# Patient Record
Sex: Male | Born: 1935 | Race: White | Hispanic: No | Marital: Married | Smoking: Former smoker
Health system: Southern US, Community
[De-identification: ages and names within clinical notes are randomized; demographics above are authoritative.]

## PROBLEM LIST (undated history)

## (undated) DIAGNOSIS — N183 Chronic kidney disease, stage 3 unspecified: Secondary | ICD-10-CM

## (undated) DIAGNOSIS — I493 Ventricular premature depolarization: Secondary | ICD-10-CM

## (undated) DIAGNOSIS — I4821 Permanent atrial fibrillation: Secondary | ICD-10-CM

## (undated) DIAGNOSIS — I495 Sick sinus syndrome: Secondary | ICD-10-CM

## (undated) DIAGNOSIS — E739 Lactose intolerance, unspecified: Secondary | ICD-10-CM

## (undated) DIAGNOSIS — F419 Anxiety disorder, unspecified: Secondary | ICD-10-CM

## (undated) DIAGNOSIS — I10 Essential (primary) hypertension: Secondary | ICD-10-CM

## (undated) DIAGNOSIS — C801 Malignant (primary) neoplasm, unspecified: Secondary | ICD-10-CM

## (undated) DIAGNOSIS — S0291XA Unspecified fracture of skull, initial encounter for closed fracture: Secondary | ICD-10-CM

## (undated) DIAGNOSIS — K589 Irritable bowel syndrome without diarrhea: Secondary | ICD-10-CM

## (undated) DIAGNOSIS — E782 Mixed hyperlipidemia: Secondary | ICD-10-CM

## (undated) DIAGNOSIS — I340 Nonrheumatic mitral (valve) insufficiency: Secondary | ICD-10-CM

## (undated) DIAGNOSIS — I2510 Atherosclerotic heart disease of native coronary artery without angina pectoris: Secondary | ICD-10-CM

## (undated) DIAGNOSIS — I351 Nonrheumatic aortic (valve) insufficiency: Secondary | ICD-10-CM

## (undated) DIAGNOSIS — C4490 Unspecified malignant neoplasm of skin, unspecified: Secondary | ICD-10-CM

## (undated) HISTORY — DX: Unspecified malignant neoplasm of skin, unspecified: C44.90

## (undated) HISTORY — DX: Irritable bowel syndrome, unspecified: K58.9

## (undated) HISTORY — DX: Permanent atrial fibrillation: I48.21

## (undated) HISTORY — DX: Essential (primary) hypertension: I10

## (undated) HISTORY — DX: Nonrheumatic aortic (valve) insufficiency: I35.1

## (undated) HISTORY — DX: Mixed hyperlipidemia: E78.2

## (undated) HISTORY — DX: Nonrheumatic mitral (valve) insufficiency: I34.0

## (undated) HISTORY — DX: Unspecified fracture of skull, initial encounter for closed fracture: S02.91XA

## (undated) HISTORY — DX: Atherosclerotic heart disease of native coronary artery without angina pectoris: I25.10

## (undated) HISTORY — PX: OTHER SURGICAL HISTORY: SHX169

## (undated) HISTORY — DX: Ventricular premature depolarization: I49.3

## (undated) HISTORY — DX: Anxiety disorder, unspecified: F41.9

## (undated) HISTORY — DX: Lactose intolerance, unspecified: E73.9

## (undated) HISTORY — PX: PENILE PROSTHESIS IMPLANT: SHX240

## (undated) HISTORY — DX: Sick sinus syndrome: I49.5

---
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
## 2005-01-21 ENCOUNTER — Encounter: Payer: Self-pay | Admitting: Cardiology

## 2005-02-16 ENCOUNTER — Encounter: Payer: Self-pay | Admitting: Cardiology

## 2008-05-22 ENCOUNTER — Encounter: Payer: Self-pay | Admitting: Cardiology

## 2008-09-23 ENCOUNTER — Encounter: Payer: Self-pay | Admitting: Cardiology

## 2009-04-15 ENCOUNTER — Encounter: Payer: Self-pay | Admitting: Cardiology

## 2009-04-21 ENCOUNTER — Encounter: Payer: Self-pay | Admitting: Cardiology

## 2009-05-25 DIAGNOSIS — E782 Mixed hyperlipidemia: Secondary | ICD-10-CM

## 2009-05-25 DIAGNOSIS — F411 Generalized anxiety disorder: Secondary | ICD-10-CM | POA: Insufficient documentation

## 2009-05-25 DIAGNOSIS — G4700 Insomnia, unspecified: Secondary | ICD-10-CM | POA: Insufficient documentation

## 2009-05-25 DIAGNOSIS — Z87448 Personal history of other diseases of urinary system: Secondary | ICD-10-CM | POA: Insufficient documentation

## 2009-05-25 DIAGNOSIS — K4090 Unilateral inguinal hernia, without obstruction or gangrene, not specified as recurrent: Secondary | ICD-10-CM | POA: Insufficient documentation

## 2009-05-26 ENCOUNTER — Ambulatory Visit: Payer: Self-pay | Admitting: Cardiology

## 2009-05-26 DIAGNOSIS — I2510 Atherosclerotic heart disease of native coronary artery without angina pectoris: Secondary | ICD-10-CM

## 2009-05-26 DIAGNOSIS — N529 Male erectile dysfunction, unspecified: Secondary | ICD-10-CM

## 2009-06-08 ENCOUNTER — Telehealth (INDEPENDENT_AMBULATORY_CARE_PROVIDER_SITE_OTHER): Payer: Self-pay | Admitting: *Deleted

## 2009-08-20 ENCOUNTER — Encounter: Payer: Self-pay | Admitting: Cardiology

## 2009-08-24 ENCOUNTER — Ambulatory Visit (HOSPITAL_BASED_OUTPATIENT_CLINIC_OR_DEPARTMENT_OTHER): Admission: RE | Admit: 2009-08-24 | Discharge: 2009-08-24 | Payer: Self-pay | Admitting: Urology

## 2009-08-24 ENCOUNTER — Encounter: Payer: Self-pay | Admitting: Cardiology

## 2009-10-16 ENCOUNTER — Encounter: Payer: Self-pay | Admitting: Cardiology

## 2009-12-03 ENCOUNTER — Encounter: Payer: Self-pay | Admitting: Cardiology

## 2009-12-10 ENCOUNTER — Encounter: Payer: Self-pay | Admitting: Cardiology

## 2009-12-17 ENCOUNTER — Encounter (INDEPENDENT_AMBULATORY_CARE_PROVIDER_SITE_OTHER): Payer: Self-pay | Admitting: *Deleted

## 2009-12-17 ENCOUNTER — Ambulatory Visit: Payer: Self-pay | Admitting: Cardiology

## 2009-12-17 DIAGNOSIS — I10 Essential (primary) hypertension: Secondary | ICD-10-CM | POA: Insufficient documentation

## 2009-12-22 ENCOUNTER — Ambulatory Visit: Payer: Self-pay | Admitting: Cardiology

## 2010-05-27 ENCOUNTER — Encounter: Payer: Self-pay | Admitting: Cardiology

## 2010-06-15 ENCOUNTER — Ambulatory Visit: Payer: Self-pay | Admitting: Cardiology

## 2010-06-16 ENCOUNTER — Encounter: Payer: Self-pay | Admitting: Cardiology

## 2010-06-18 ENCOUNTER — Ambulatory Visit: Payer: Self-pay | Admitting: Cardiology

## 2010-06-21 ENCOUNTER — Encounter: Payer: Self-pay | Admitting: Cardiology

## 2010-06-30 ENCOUNTER — Encounter (INDEPENDENT_AMBULATORY_CARE_PROVIDER_SITE_OTHER): Payer: Self-pay | Admitting: *Deleted

## 2010-09-09 ENCOUNTER — Encounter: Payer: Self-pay | Admitting: Cardiology

## 2010-09-09 ENCOUNTER — Encounter (INDEPENDENT_AMBULATORY_CARE_PROVIDER_SITE_OTHER): Payer: Self-pay | Admitting: *Deleted

## 2010-09-16 NOTE — Letter (Signed)
Summary: External Correspondence/ OFFICE VISIT [facility]  External Correspondence/ OFFICE VISIT [facility]   Imported By: Dorise Hiss 12/16/2009 14:40:28  _____________________________________________________________________  External Attachment:    Type:   Image     Comment:   External Document

## 2010-09-16 NOTE — Letter (Signed)
Summary: [facility]  [facility]   Imported By: Marylou Mccoy 06/15/2010 08:34:16  _____________________________________________________________________  External Attachment:    Type:   Image     Comment:   External Document

## 2010-09-16 NOTE — Letter (Signed)
Summary: Generic Engineer, agricultural at [facility] [address]   [place], [place] [address]   Phone: [phone]  Fax: [phone]        September 09, 2010 MRN: [id number]    [place] Mason [address] [place], [place]  [address]    Dear [name],   Our office has been trying to contact you regarding your recent holter monitor results and Dr. Margarita Mail recommendations.  After numerous attempts to reach you by phone, we have been unsuccessful.    Please contact our office at your earliest convenience to discuss.         Sincerely,  Hoover Brunette, LPN  This letter has been electronically signed by your physician.

## 2010-09-16 NOTE — Letter (Signed)
Summary: External Correspondence/ NOTE DR. Melvenia Beam  External Correspondence/ NOTE DR. Melvenia Beam   Imported By: Dorise Hiss 08/20/2009 15:07:47  _____________________________________________________________________  External Attachment:    Type:   Image     Comment:   External Document

## 2010-09-16 NOTE — Miscellaneous (Signed)
Summary: update - amiodarone  Clinical Lists Changes  Medications: Removed medication of AMIODARONE HCL 200 MG TABS (AMIODARONE HCL) Take 1 tablet by mouth once a day

## 2010-09-16 NOTE — Letter (Signed)
Summary: External Correspondence/ [facility] [place]  External Correspondence/ FAX [facility] [place]   Imported By: Dorise Hiss 08/20/2009 15:13:54  _____________________________________________________________________  External Attachment:    Type:   Image     Comment:   External Document

## 2010-09-16 NOTE — Assessment & Plan Note (Signed)
Summary: ROV-CHEST PAIN   Visit Type:  Follow-up Primary Provider:  Paul,Sasser   CC:  chest pain.  History of Present Illness: the patient is a pleasant 77 year old male with a history of coronary artery disease, atrial fibrillation on amiodarone therapy. The patient has been referred by Dr. Neita Carp for an episode of substernal chest pain approximately occurred 10 days ago the patient stated that he required 2 nitroglycerin. He reported the pain is substernal not associated with shortness of breath diaphoresis or radiation into the jaw or arm. Interestingly, the patient also had in the interim no exertional symptoms and has remained very active. He states that he had a Cardiolite study done approximately 3 years ago. Is otherwise compliant with his medical regimen. He also reports occasional skipped heartbeats but no definite episodes of atrial fibrillation. He has had no decrease in his exercise tolerance. He denies any presyncope or syncope. The patient's lipid panel is followed by Dr. Neita Carp and has been under excellent control with a total cholesterol of less than 110 and a reported LDL of 54.  Preventive Screening-Counseling & Management  Alcohol-Tobacco     Smoking Status: quit     Year Quit: 1964   Current Medications (verified): 1)  Amiodarone Hcl 200 Mg Tabs (Amiodarone Hcl) .... Take 1 Tablet By Mouth Once A Day 2)  Flomax 0.4 Mg Caps (Tamsulosin Hcl) .... Take 1 Tablet By Mouth Once A Day 3)  Lorazepam 1 Mg Tabs (Lorazepam) .... Take 1  Tablet By Mouth Once A Day At Bedtime Along With 0.5mg  Ativan 4)  Ativan 0.5 Mg Tabs (Lorazepam) .... Take 1 Tablet By Mouth Once A Day At Bedtime Along With 1mg  Ativan 5)  Aspirin 81 Mg Tbec (Aspirin) .... Take 1 Tablet By Mouth Once A Day 6)  Zocor 40 Mg Tabs (Simvastatin) .... Take 1 Tablet By Mouth Once A Day 7)  Atenolol 25 Mg Tabs (Atenolol) .... Take 1 Tablet By Mouth Once A Day 8)  Melatonin 3 Mg Tabs (Melatonin) .... Take 1 Tablet By  Mouth Once A Day 9)  Proscar 5 Mg Tabs (Finasteride) .... Take 1 Tablet By Mouth Once A Day 10)  Centrum Cardio  Tabs (Multiple Vitamins-Minerals) .... Take 1 Tablet By Mouth Once A Day 11)  Aleve 220 Mg Tabs (Naproxen Sodium) .... Take 1 Tablet By Mouth Two Times A Day  Allergies (verified): No Known Drug Allergies  Comments:  Nurse/Medical Assistant: The patient's medications and allergies were reviewed with the patient and were updated in the Medication and Allergy Lists. List reviewed.  Past History:  Past Medical History: Last updated: 05/26/2009 ATRIAL FIBRILLATION (ICD-427.31) PERSONAL HISTORY OTHER DISORDER URINARY SYSTEM (ICD-V13.09) INSOMNIA UNSPECIFIED (ICD-780.52) HYPERCHOLESTEROLEMIA, MILD (ICD-272.0) ANXIETY (ICD-300.00) CAD (stenting to the right coronary artery and the LAD in 1995)  Past Surgical History: Last updated: 05/26/2009 Repair of left inguinal hernia Penile implant  Family History: Last updated: 05/26/2009 His father had coronary disease in his 19s of a myocardial infarction in his early 47s.  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 He is about to be married for the fourth time. He has 7 children  Risk Factors: Smoking Status: quit (12/17/2009)  Review of Systems       The patient complains of chest pain.  The patient denies fatigue, malaise, fever, weight gain/loss, vision loss, decreased hearing, hoarseness, palpitations, shortness of breath, prolonged cough, wheezing, sleep apnea, coughing up blood,  abdominal pain, blood in stool, nausea, vomiting, diarrhea, heartburn, incontinence, blood in urine, muscle weakness, joint pain, leg swelling, rash, skin lesions, headache, fainting, dizziness, depression, anxiety, enlarged lymph nodes, easy bruising or bleeding, and environmental allergies.    Vital Signs:  Patient profile:   77 year old male Height:       72 inches Weight:      191 pounds Pulse rate:   43 / minute BP sitting:   177 / 75  (left arm) Cuff size:   regular  Vitals Entered By: Carlye Grippe (Dec 17, 2009 2:20 PM) CC: chest pain   Physical Exam  Additional Exam:  General: Well-developed, well-nourished in no distress head: Normocephalic and atraumatic eyes PERRLA/EOMI intact, conjunctiva and lids normal nose: No deformity or lesions mouth normal dentition, normal posterior pharynx neck: Supple, no JVD.  No masses, thyromegaly or abnormal cervical nodes lungs: Normal breath sounds bilaterally without wheezing.  Normal percussion heart: regular rate and rhythm with normal S1 and S2, no S3 or S4.  PMI is normal.  No pathological murmurs abdomen: Normal bowel sounds, abdomen is soft and nontender without masses, organomegaly or hernias noted.  No hepatosplenomegaly musculoskeletal: Back normal, normal gait muscle strength and tone normal pulsus: Pulse is normal in all 4 extremities Extremities: No peripheral pitting edema neurologic: Alert and oriented x 3 skin: Intact without lesions or rashes cervical nodes: No significant adenopathy psychologic: Normal affect    Impression & Recommendations:  Problem # 1:  CORONARY ARTERY DISEASE (ICD-414.00) the patient had a single episode of chest pain. There were some typical and atypical features. He has had no recurrent chest pain and there is no exertional component. I recommended a Lexiscan to the patient this will be scheduled in the next couple of days prior to his departure to [place] His updated medication list for this problem includes:    Aspirin 81 Mg Tbec (Aspirin) .Marland Kitchen... Take 1 tablet by mouth once a day    Atenolol 25 Mg Tabs (Atenolol) .Marland Kitchen... Take 1 tablet by mouth once a day  Problem # 2:  ATRIAL FIBRILLATION (ICD-427.31) the patient does report occasional skipped beats but there is no clinical evidence of recurrent atrial fibrillation. Amiodarone can be continued  if the patient has worsening bradycardia in the future atenolol can be discontinued His updated medication list for this problem includes:    Amiodarone Hcl 200 Mg Tabs (Amiodarone hcl) .Marland Kitchen... Take 1 tablet by mouth once a day    Aspirin 81 Mg Tbec (Aspirin) .Marland Kitchen... Take 1 tablet by mouth once a day    Atenolol 25 Mg Tabs (Atenolol) .Marland Kitchen... Take 1 tablet by mouth once a day  Problem # 3:  ESSENTIAL HYPERTENSION, BENIGN (ICD-401.1) the patient's blood pressure is poorly controlled. In the setting of chest pain we will add amlodipine 5 mg p.o. q. daily to his medical regimen. His updated medication list for this problem includes:    Aspirin 81 Mg Tbec (Aspirin) .Marland Kitchen... Take 1 tablet by mouth once a day    Atenolol 25 Mg Tabs (Atenolol) .Marland Kitchen... Take 1 tablet by mouth once a day  Appended Document: [facility]     Allergies: No Known Drug Allergies   Other Orders: Nuclear Med (Nuc Med)  Patient Instructions: 1)  Your physician has requested that you have an lexiscan myoview.  For further information please visit [email or web address]  Please follow instruction sheet, as given. 2)  Your physician has recommended you make the following change in your  medication: START AMLODIPINE 5MG  DAILY. All your other meds will remain the same.  3)  Your physician wants you to follow-up in: 6months. You will receive a reminder letter in the mail about two months in advance. If you don't receive a letter, please call our office to schedule the follow-up appointment. Prescriptions: AMLODIPINE BESYLATE 5 MG TABS (AMLODIPINE BESYLATE) Take 1 tablet by mouth once a day  #30 x 6   Entered by:   Carlye Grippe   Authorized by:   Lewayne Bunting, MD, [facility]   Signed by:   Carlye Grippe on 12/17/2009   Method used:   Electronically to        [facility] [address] (retail)       [address]       [place], [place]  [address]       Ph: [phone] or [phone]       Fax:  [phone]   RxID:   [id number]

## 2010-09-16 NOTE — Procedures (Signed)
Summary: Holter and Event/ [facility]  Holter and Event/ [facility]   Imported By: Dorise Hiss 08/20/2009 15:05:00  _____________________________________________________________________  External Attachment:    Type:   Image     Comment:   External Document

## 2010-09-16 NOTE — Assessment & Plan Note (Signed)
Summary: 6 MO FU   Visit Type:  Follow-up Primary Provider:  Paul,Sasser    History of Present Illness: the patient is a 77 year old male with history of coronary artery disease, intrafibrillation on amiodarone maintaining normal sinus rhythm.  The patient several months ago had a history of chest pain.  Cardiolite study was performed which showed normal LV function and an ejection fraction of 50%.  There was no definite ischemia.  The patient curling the office today is in atrial fibrillation.  He does not report palpitations.  He reports a single episode of chest pain as well as left arm pain but only when he is lying on his left side.  It is not clear the patient is in permanent or paroxysmal atrial fibrillation.  Preventive Screening-Counseling & Management  Alcohol-Tobacco     Smoking Status: quit     Year Quit: 1964  Current Medications (verified): 1)  Amiodarone Hcl 200 Mg Tabs (Amiodarone Hcl) .... Take 1 Tablet By Mouth Once A Day 2)  Flomax 0.4 Mg Caps (Tamsulosin Hcl) .... Take 1 Tablet By Mouth Once A Day 3)  Ativan 2 Mg Tabs (Lorazepam) .... Take 1 Tablet By Mouth Once A Day 4)  Aspirin 81 Mg Tbec (Aspirin) .... Take 1 Tablet By Mouth Once A Day 5)  Simvastatin 20 Mg Tabs (Simvastatin) .... Take 1 Tablet By Mouth Once A Day 6)  Atenolol 25 Mg Tabs (Atenolol) .... Take 1 Tablet By Mouth Once A Day 7)  Melatonin 3 Mg Tabs (Melatonin) .... Take 1 Tablet By Mouth Once A Day 8)  Proscar 5 Mg Tabs (Finasteride) .... Take 1 Tablet By Mouth Once A Day 9)  Centrum Cardio  Tabs (Multiple Vitamins-Minerals) .... Take 1 Tablet By Mouth Once A Day 10)  Aleve 220 Mg Tabs (Naproxen Sodium) .... Take 1 Tablet By Mouth Two Times A Day 11)  Amlodipine Besylate 5 Mg Tabs (Amlodipine Besylate) .... Take 1 Tablet By Mouth Once A Day  Allergies (verified): No Known Drug Allergies  Comments:  Nurse/Medical Assistant: The patient's medications and allergies were verbally reviewed with the  patient and were updated in the Medication and Allergy Lists.  Past History:  Past Surgical History: Last updated: 05/26/2009 Repair of left inguinal hernia Penile implant  Family History: Last updated: 05/26/2009 His father had coronary disease in his 58s of a myocardial infarction in his early 13s.  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 He is about to be married for the fourth time. He has 7 children  Risk Factors: Smoking Status: quit (06/15/2010)  Past Medical History: ATRIAL FIBRILLATION (ICD-427.31) PERSONAL HISTORY OTHER DISORDER URINARY SYSTEM (ICD-V13.09) INSOMNIA UNSPECIFIED (ICD-780.52) HYPERCHOLESTEROLEMIA, MILD (ICD-272.0) ANXIETY (ICD-300.00) CAD (stenting to the right coronary artery and the LAD in 1995) Cardiolite study May 2011 ejection fraction 50%.  Probably normal perfusion.  Medium fixed mid to basal inferior defect consistent with attenuation artifact  Review of Systems  The patient denies fatigue, malaise, fever, weight gain/loss, vision loss, decreased hearing, hoarseness, chest pain, palpitations, shortness of breath, prolonged cough, wheezing, sleep apnea, coughing up blood, abdominal pain, blood in stool, nausea, vomiting, diarrhea, heartburn, incontinence, blood in urine, muscle weakness, joint pain, leg swelling, rash, skin lesions, headache, fainting, dizziness, depression, anxiety, enlarged lymph nodes, easy bruising or bleeding, and environmental allergies.         back pain.  Single episode of chest pain related to position and  left arm pain  Vital Signs:  Patient profile:   77 year old male Height:      72 inches Weight:      198 pounds BMI:     26.95 Pulse rate:   86 / minute BP sitting:   126 / 73  (left arm) Cuff size:   regular  Vitals Entered By: Carlye Grippe (June 15, 2010 9:54 AM)  Nutrition Counseling: Patient's BMI is greater  than 25 and therefore counseled on weight management options.  Physical Exam  Additional Exam:  General: Well-developed, well-nourished in no distress head: Normocephalic and atraumatic eyes PERRLA/EOMI intact, conjunctiva and lids normal nose: No deformity or lesions mouth normal dentition, normal posterior pharynx neck: Supple, no JVD.  No masses, thyromegaly or abnormal cervical nodes lungs: Normal breath sounds bilaterally without wheezing.  Normal percussion heart: irregular rate and rhythmwith normal S1 and S2, no S3 or S4.  PMI is normal.  No pathological murmurs abdomen: Normal bowel sounds, abdomen is soft and nontender without masses, organomegaly or hernias noted.  No hepatosplenomegaly musculoskeletal: Back normal, normal gait muscle strength and tone normal pulsus: Pulse is normal in all 4 extremities Extremities: No peripheral pitting edema neurologic: Alert and oriented x 3 skin: Intact without lesions or rashes cervical nodes: No significant adenopathy psychologic: Normal affect    EKG  Procedure date:  06/15/2010  Findings:      intrafibrillation with nonspecific ST-T wave changes.  Heart rate 68 beats/min  Impression & Recommendations:  Problem # 1:  ATRIAL FIBRILLATION (ICD-427.31) not certain if the patient is in permanent atrial fibrillation or paroxysmal intrafibrillation.  CardioNet monitor will be applied if the patient remains in atrial fibrillation and amiodarone will be discontinued.  We will further aim for rate control.CHA2DS2-Vascscore is 4 and the patient could benefit from Coumadin.  We will make this decision after the Holter monitor.  His updated medication list for this problem includes:    Amiodarone Hcl 200 Mg Tabs (Amiodarone hcl) .Marland Kitchen... Take 1 tablet by mouth once a day    Aspirin 81 Mg Tbec (Aspirin) .Marland Kitchen... Take 1 tablet by mouth once a day    Atenolol 25 Mg Tabs (Atenolol) .Marland Kitchen... Take 1 tablet by mouth once a day  Orders: EKG w/  Interpretation (93000) T-TSH (14782-95621) Cardionet/Event Monitor (Cardionet/Event)  Problem # 2:  CORONARY ARTERY DISEASE (ICD-414.00)  His updated medication list for this problem includes:    Aspirin 81 Mg Tbec (Aspirin) .Marland Kitchen... Take 1 tablet by mouth once a day    Atenolol 25 Mg Tabs (Atenolol) .Marland Kitchen... Take 1 tablet by mouth once a day    Amlodipine Besylate 5 Mg Tabs (Amlodipine besylate) .Marland Kitchen... Take 1 tablet by mouth once a day  Problem # 3:  ANXIETY (ICD-300.00) Assessment: Comment Only  Patient Instructions: 1)  Lab:  TSH 2)  48 hour holter monitor 3)  Patient may need to stop Amiodarone and start Coumadin, but will decide after monitor. 4)  Follow up in  6 months  Appended Document: 6 MO FU I reviewed the patient's Holter monitor and he appears to be in atrial fibrillation which is permanent. His heart rate is controlled. As documented in my notes the patient will be taken off amiodarone please notify him to discontinue amiodarone we will aim for rate control and therefore I would increase atenolol to 50 mg p.o. q. daily he reports more palpitations with this then it can be increased to 100 mg p.o. q. daily  Appended  Document: 6 MO FU Patient notified.

## 2010-09-16 NOTE — Miscellaneous (Signed)
Summary: atenolol update  Clinical Lists Changes  Medications: Changed medication from ATENOLOL 25 MG TABS (ATENOLOL) Take 1 tablet by mouth once a day to ATENOLOL 50 MG TABS (ATENOLOL) Take 1 tablet by mouth once a day - Signed Rx of ATENOLOL 50 MG TABS (ATENOLOL) Take 1 tablet by mouth once a day;  #30 x 6;  Signed;  Entered by: Hoover Brunette, LPN;  Authorized by: Lewayne Bunting, MD, [facility];  Method used: Electronically to [facility] [address] [place], [place], [place]  [address], Ph: [phone] or [phone], Fax: [phone]    Prescriptions: ATENOLOL 50 MG TABS (ATENOLOL) Take 1 tablet by mouth once a day  #30 x 6   Entered by:   Hoover Brunette, LPN   Authorized by:   Lewayne Bunting, MD, [facility]   Signed by:   Hoover Brunette, LPN on 93/23/5573   Method used:   Electronically to        [facility] [address] (retail)       [address]       [place], [place]  [address]       Ph: [phone] or [phone]       Fax: [phone]   RxID:   [id number]

## 2010-09-16 NOTE — Letter (Signed)
Summary: Lexiscan or Dobutamine Pharmacist, community at [facility]  [address]   [place], [place] [address]   Phone: [phone]  Fax: [phone]      [facility]  Lexiscan  Cardiolite Strss Test    [place]  Appointment Date:_  Appointment Time:_  Your doctor has ordered a CARDIOLITE STRESS TEST using a medication to stimulate exercise so that you will not have to walk on the treadmill to determine the condition of your heart during stress. You may take your  medications the day of your test. You should not have anything to eat or drink at least 4 hours before your test is scheduled, and no caffeine, including decaffeinated tea and coffee, chocolate, and soft drinks for 24 hours before your test.  You will need to register at the Outpatient/Main Entrance at the hospital 15 minutes before your appointment time. It is a good idea to bring a copy of your order with you. They will direct you to the Diagnostic Imaging (Radiology) Department.  You will be asked to undress from the waist up and given a hospital gown to wear, so dress comfortably from the waist down for example: Sweat pants, shorts, or skirt Rubber soled lace up shoes (tennis shoes)  Plan on about three hours from registration to release from the hospital

## 2010-09-16 NOTE — Procedures (Addendum)
Summary: Holter and Event/ [facility]  Holter and Event/ [facility]   Imported By: Dorise Hiss 06/30/2010 15:55:03  _____________________________________________________________________  External Attachment:    Type:   Image     Comment:   External Document  Appended Document: Holter and Event/ [facility] patient is in permanent atrial fibrillation.  Amiodarone was started and discontinued.  Coumadin should be started.  Goal INR between two and 3  Appended Document: Holter and Event/ [facility] Left message to return call.   Appended Document: Holter and Event/ [facility] Left message to return call.  Appended Document: Holter and Event/ [facility] No return call at present.  Also, tried to reach patient on cell:  [phone].   Left message to return call on cell.   Will also mail letter today.  Appended Document: Holter and Event/ [facility] Patient notified.   States he is in [place] till the end of March.  Is currently on Aspirin 81mg  daily.  Patient questions if he can wait to begin after March when he will be home again.  If not, he will have to find MD there to manage.  States he is feeling good, no problems.    cell:  [phone]  Appended Document: Holter and Event/ [facility] this can wait until patient sees [place] in follow-up.  Appended Document: Holter and Event/ [facility] Not due back for 6 month f/u till May.  OR  Should he schedule OV when he comes back in March.    Appended Document: Holter and Event/ [facility] yes  Appended Document: Holter and Event/ [facility] Patient notified.   OV given for Wednesday, March 14 at 9:45 with GD.

## 2010-09-17 ENCOUNTER — Telehealth (INDEPENDENT_AMBULATORY_CARE_PROVIDER_SITE_OTHER): Payer: Self-pay | Admitting: *Deleted

## 2010-09-22 NOTE — Progress Notes (Signed)
Summary: NEED MARCH 14TH APPT RESCHEDULED/IN [place] [facility]  Phone Note Call from Patient Call back at Home Phone [phone]   Caller: Patient Call For: nurse Summary of Call: message left on nurse voicemail from patient that he will be in [place] till end of March and will not be able to come to 10/27/10 appt that was scheduled by [facility]. appt will be rescheduled and patient will be notified. Patient given appt for April 3rd 2012 @1 :15pm with [facility]. patient informed. Initial call taken by: Carlye Grippe,  September 17, 2010 2:38 PM

## 2010-10-27 ENCOUNTER — Ambulatory Visit: Payer: Self-pay | Admitting: Cardiology

## 2010-10-31 LAB — POCT I-STAT, CHEM 8
BUN: 34 mg/dL — ABNORMAL HIGH (ref 6–23)
Calcium, Ion: 1.28 mmol/L (ref 1.12–1.32)
Chloride: 109 mEq/L (ref 96–112)
Creatinine, Ser: 1.9 mg/dL — ABNORMAL HIGH (ref 0.4–1.5)
Glucose, Bld: 86 mg/dL (ref 70–99)
HCT: 47 % (ref 39.0–52.0)
Hemoglobin: 16 g/dL (ref 13.0–17.0)
Potassium: 5.2 mEq/L — ABNORMAL HIGH (ref 3.5–5.1)
Sodium: 141 mEq/L (ref 135–145)
TCO2: 31 mmol/L (ref 0–100)

## 2010-11-16 ENCOUNTER — Ambulatory Visit (INDEPENDENT_AMBULATORY_CARE_PROVIDER_SITE_OTHER): Payer: Medicare Other | Admitting: Cardiology

## 2010-11-16 ENCOUNTER — Encounter: Payer: Self-pay | Admitting: Cardiology

## 2010-11-16 VITALS — BP 121/77 | HR 61 | Ht 72.0 in | Wt 194.0 lb

## 2010-11-16 DIAGNOSIS — I2510 Atherosclerotic heart disease of native coronary artery without angina pectoris: Secondary | ICD-10-CM

## 2010-11-16 DIAGNOSIS — I4891 Unspecified atrial fibrillation: Secondary | ICD-10-CM

## 2010-11-16 MED ORDER — WARFARIN SODIUM 5 MG PO TABS: 5.0000 mg | ORAL_TABLET | Freq: Every day | ORAL | Status: DC

## 2010-11-16 NOTE — Assessment & Plan Note (Signed)
Permanent atrial fibrillation: Amiodarone has been discontinued. Heart rate has been controlled. We had a long discussion about anticoagulation today. The patient states that he does not want one of the newer antithrombotic or anti-10 A. anticoagulants because he feels that there is not enough experience with the drugs yet. As a matter of fact with his creatinine of 1.76 Coumadin is probably the better option. The patient will continue on aspirin 81 mg a day given his prior history of stent placements. He is also very adamant that he does not want to discontinue naproxen and therefore we will keep his PT INR between 2 and 2.5.

## 2010-11-16 NOTE — Progress Notes (Signed)
HPI The patient is a 77 year old male with a history of coronary artery disease, status post coronary artery stent placement in 1995x2. Paroxysmal atrial fibrillation on amiodarone therapy initially maintaining normal sinus rhythm. Last year the patient reported chest pain and a Cardiolite study was performed which showed normal LV function and ejection fraction of 50%. There was no ischemia. The patient had laboratory work from a primary care physician at that time which showed a mildly elevated TSH level. He also has renal insufficiency with a creatinine of 1.76 and a GFR of 37 mL per minute. Hemoglobin was stable at 13.2 g/dL A recent Holter monitor showed that the patient was in permanent atrial fibrillation. I gave her a recommendation to discontinue amiodarone. The patient initially could not come back because he was in [place]. He now presents for followup. The patient also presents today to discuss the need for anticoagulation. His CHADS2 score is 2. He also vascular disease with a prior history of stent placement in 1995. The patient states that since stopping amiodarone he has experienced no palpitations but does know his rhythm is irregular. He has had no presyncope or syncope. He has had no chest pain.  No Known Allergies  No current outpatient prescriptions on file prior to visit.    Past Medical History  Diagnosis Date  . Atrial fibrillation   . Pure hypercholesterolemia   . Anxiety state, unspecified   . Coronary atherosclerosis of native coronary artery   . Congestive heart failure, unspecified   . Insomnia, unspecified   . Personal history of other disorder of urinary system     Past Surgical History  Procedure Date  . Left inguinal hernia   . Penile prosthesis implant     Family History  Problem Relation Age of Onset  . Heart attack Father     in his early 83's    History   Social History  . Marital Status: Married    Spouse Name: N/A    Number of Children: 7   . Years of Education: N/A   Occupational History  . 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 Smoking status: Former Smoker    Quit date: 08/15/1962  . Smokeless tobacco: Not on file  . Alcohol Use: No  . Drug Use: No  . Sexually Active: Not on file   Other Topics Concern  . Not on file   Social History Narrative  . No narrative on file    review of systems:Pertinent positives as outlined above. The remainder of the 18  point review of systems is negative  PHYSICAL EXAM BP 121/77  Pulse 61  Ht 6' (1.829 m)  Wt 194 lb (87.998 kg)  BMI 26.31 kg/m2 General: Well-developed, well-nourished in no distress Head: Normocephalic and atraumatic Eyes:PERRLA/EOMI intact, conjunctiva and lids normal Ears: No deformity or lesions Mouth:normal dentition, normal posterior pharynx Neck: Supple, no JVD.  No masses, thyromegaly or abnormal cervical nodes Lungs: Normal breath sounds bilaterally without wheezing.  Normal percussion Cardiac: Irregular rate and rhythm with normal S1 and S2, no S3 or S4.  PMI is normal.  No pathological murmurs Abdomen: Normal bowel sounds, abdomen is soft and nontender without masses, organomegaly or hernias noted.  No hepatosplenomegaly MSK: Back normal, normal gait muscle strength and tone normal Vascular: Pulse is normal in all 4 extremities Extremities: No peripheral pitting edema Neurologic: Alert and oriented x 3 Skin: Intact without lesions or rashes Lymphatics: No  significant adenopathy Psychologic: Normal affect   ECG: Not applicable  ASSESSMENT AND PLAN

## 2010-11-16 NOTE — Patient Instructions (Signed)
Coumadin clinic Your physician wants you to follow up in: 6 months.  You will receive a reminder letter in the mail one-two months in advance.  If you don't receive a letter, please call our office to schedule the follow up appointment

## 2010-11-23 ENCOUNTER — Ambulatory Visit (INDEPENDENT_AMBULATORY_CARE_PROVIDER_SITE_OTHER): Payer: Medicare Other | Admitting: *Deleted

## 2010-11-23 DIAGNOSIS — Z7901 Long term (current) use of anticoagulants: Secondary | ICD-10-CM

## 2010-11-23 DIAGNOSIS — I4891 Unspecified atrial fibrillation: Secondary | ICD-10-CM

## 2010-11-23 LAB — POCT INR: INR: 3.5

## 2010-11-26 ENCOUNTER — Encounter: Payer: Medicare Other | Admitting: *Deleted

## 2010-11-30 ENCOUNTER — Ambulatory Visit (INDEPENDENT_AMBULATORY_CARE_PROVIDER_SITE_OTHER): Payer: Medicare Other | Admitting: *Deleted

## 2010-11-30 DIAGNOSIS — I4891 Unspecified atrial fibrillation: Secondary | ICD-10-CM

## 2010-11-30 DIAGNOSIS — Z7901 Long term (current) use of anticoagulants: Secondary | ICD-10-CM

## 2010-11-30 LAB — POCT INR: INR: 3

## 2010-12-14 ENCOUNTER — Ambulatory Visit (INDEPENDENT_AMBULATORY_CARE_PROVIDER_SITE_OTHER): Payer: Medicare Other | Admitting: *Deleted

## 2010-12-14 DIAGNOSIS — I4891 Unspecified atrial fibrillation: Secondary | ICD-10-CM

## 2010-12-14 DIAGNOSIS — Z7901 Long term (current) use of anticoagulants: Secondary | ICD-10-CM

## 2010-12-14 LAB — POCT INR: INR: 1.9

## 2010-12-24 ENCOUNTER — Ambulatory Visit (INDEPENDENT_AMBULATORY_CARE_PROVIDER_SITE_OTHER): Payer: Medicare Other | Admitting: *Deleted

## 2010-12-24 DIAGNOSIS — I4891 Unspecified atrial fibrillation: Secondary | ICD-10-CM

## 2010-12-24 DIAGNOSIS — Z7901 Long term (current) use of anticoagulants: Secondary | ICD-10-CM

## 2010-12-24 LAB — POCT INR: INR: 2

## 2011-01-04 ENCOUNTER — Ambulatory Visit (INDEPENDENT_AMBULATORY_CARE_PROVIDER_SITE_OTHER): Payer: Medicare Other | Admitting: *Deleted

## 2011-01-04 DIAGNOSIS — Z7901 Long term (current) use of anticoagulants: Secondary | ICD-10-CM

## 2011-01-04 DIAGNOSIS — I4891 Unspecified atrial fibrillation: Secondary | ICD-10-CM

## 2011-01-04 LAB — POCT INR: INR: 1.8

## 2011-01-25 ENCOUNTER — Ambulatory Visit (INDEPENDENT_AMBULATORY_CARE_PROVIDER_SITE_OTHER): Payer: Medicare Other | Admitting: *Deleted

## 2011-01-25 DIAGNOSIS — Z7901 Long term (current) use of anticoagulants: Secondary | ICD-10-CM

## 2011-01-25 DIAGNOSIS — I4891 Unspecified atrial fibrillation: Secondary | ICD-10-CM

## 2011-02-22 ENCOUNTER — Encounter: Payer: Medicare Other | Admitting: *Deleted

## 2011-03-01 ENCOUNTER — Ambulatory Visit (INDEPENDENT_AMBULATORY_CARE_PROVIDER_SITE_OTHER): Payer: Medicare Other | Admitting: *Deleted

## 2011-03-01 DIAGNOSIS — I4891 Unspecified atrial fibrillation: Secondary | ICD-10-CM

## 2011-03-01 DIAGNOSIS — Z7901 Long term (current) use of anticoagulants: Secondary | ICD-10-CM

## 2011-03-23 ENCOUNTER — Other Ambulatory Visit: Payer: Self-pay | Admitting: Cardiology

## 2011-03-29 ENCOUNTER — Ambulatory Visit (INDEPENDENT_AMBULATORY_CARE_PROVIDER_SITE_OTHER): Payer: Medicare Other | Admitting: *Deleted

## 2011-03-29 DIAGNOSIS — Z7901 Long term (current) use of anticoagulants: Secondary | ICD-10-CM

## 2011-03-29 DIAGNOSIS — I4891 Unspecified atrial fibrillation: Secondary | ICD-10-CM

## 2011-04-01 ENCOUNTER — Ambulatory Visit (INDEPENDENT_AMBULATORY_CARE_PROVIDER_SITE_OTHER): Payer: Medicare Other | Admitting: *Deleted

## 2011-04-01 DIAGNOSIS — Z7901 Long term (current) use of anticoagulants: Secondary | ICD-10-CM

## 2011-04-01 DIAGNOSIS — I4891 Unspecified atrial fibrillation: Secondary | ICD-10-CM

## 2011-04-01 LAB — POCT INR: INR: 1.8

## 2011-04-15 ENCOUNTER — Ambulatory Visit (INDEPENDENT_AMBULATORY_CARE_PROVIDER_SITE_OTHER): Payer: Medicare Other | Admitting: *Deleted

## 2011-04-15 DIAGNOSIS — I4891 Unspecified atrial fibrillation: Secondary | ICD-10-CM

## 2011-04-15 DIAGNOSIS — Z7901 Long term (current) use of anticoagulants: Secondary | ICD-10-CM

## 2011-04-27 ENCOUNTER — Other Ambulatory Visit: Payer: Self-pay | Admitting: Cardiology

## 2011-05-06 ENCOUNTER — Ambulatory Visit (INDEPENDENT_AMBULATORY_CARE_PROVIDER_SITE_OTHER): Payer: Medicare Other | Admitting: *Deleted

## 2011-05-06 DIAGNOSIS — Z7901 Long term (current) use of anticoagulants: Secondary | ICD-10-CM

## 2011-05-06 DIAGNOSIS — I4891 Unspecified atrial fibrillation: Secondary | ICD-10-CM

## 2011-05-27 ENCOUNTER — Ambulatory Visit (INDEPENDENT_AMBULATORY_CARE_PROVIDER_SITE_OTHER): Payer: Medicare Other | Admitting: *Deleted

## 2011-05-27 DIAGNOSIS — Z7901 Long term (current) use of anticoagulants: Secondary | ICD-10-CM

## 2011-05-27 DIAGNOSIS — I4891 Unspecified atrial fibrillation: Secondary | ICD-10-CM

## 2011-06-23 ENCOUNTER — Encounter: Payer: Self-pay | Admitting: Cardiology

## 2011-06-23 ENCOUNTER — Ambulatory Visit (INDEPENDENT_AMBULATORY_CARE_PROVIDER_SITE_OTHER): Payer: Medicare Other | Admitting: Physician Assistant

## 2011-06-23 DIAGNOSIS — I4891 Unspecified atrial fibrillation: Secondary | ICD-10-CM

## 2011-06-23 DIAGNOSIS — E7800 Pure hypercholesterolemia, unspecified: Secondary | ICD-10-CM

## 2011-06-23 NOTE — Assessment & Plan Note (Signed)
Quiesced and on medical therapy. Negative nuclear imaging study, 5/11.

## 2011-06-23 NOTE — Progress Notes (Signed)
Cc: Six-month followup  HPI: Patient presents for scheduled followup, with no interim development of symptoms suggestive of UAP. He is able to climb a flight of stairs with minimal DOE. Denies any tachy palpitations, and states that he is unaware of being in atrial fibrillation, previously deemed as permanent. On chronic Coumadin and concomitant low-dose aspirin, with no noted complications.  PMH: reviewed and listed in Problem List in electronic Records (and see below)  Allergies/SH/FH: available in Electronic Records for review  Current Outpatient Prescriptions  Medication Sig Dispense Refill  . acetaminophen (TYLENOL ARTHRITIS PAIN) 650 MG CR tablet Take 650 mg by mouth every 8 (eight) hours as needed.        Marland Kitchen amLODipine (NORVASC) 5 MG tablet Take 5 mg by mouth daily.        Marland Kitchen aspirin 81 MG tablet Take 81 mg by mouth daily.        Marland Kitchen atenolol (TENORMIN) 50 MG tablet TAKE 1 TABLET ONCE DAILY  30 tablet  6  . finasteride (PROSCAR) 5 MG tablet Take 5 mg by mouth daily.        . Flavoring Agent (CHERRY FLAVOR) LIQD by Does not apply route as directed.        Marland Kitchen LORazepam (ATIVAN) 2 MG tablet Take 2 mg by mouth daily.        . Multiple Vitamins-Minerals (CENTRUM CARDIO PO) Take by mouth daily.        . simvastatin (ZOCOR) 20 MG tablet Take 20 mg by mouth at bedtime.        . Tamsulosin HCl (FLOMAX) 0.4 MG CAPS Take 0.4 mg by mouth daily.        . TESTOSTERONE IM Inject into the muscle every 14 (fourteen) days.        Marland Kitchen warfarin (COUMADIN) 5 MG tablet TAKE 1 TABLET ONCE DAILY  30 tablet  2    ROS: no nausea, vomiting; no fever, chills; no melena, hematochezia; no claudication  PHYSICAL EXAM:  BP 101/66  Pulse 81  Ht 6' (1.829 m)  Wt 191 lb (86.637 kg)  BMI 25.90 kg/m2  GENERAL: 77 year old male, sitting upright; NAD HEENT: NCAT, PERRLA, EOMI; sclera clear; no xanthelasma NECK: palpable bilateral carotid pulses, no bruits; no JVD; no TM LUNGS: CTA bilaterally CARDIAC: Irregularly  irregular (S1, S2); no significant murmurs; no rubs or gallops ABDOMEN: soft, non-tender; intact BS EXTREMETIES: intact distal pulses; no significant peripheral edema SKIN: warm/dry; no obvious rash/lesions MUSCULOSKELETAL: no joint deformity NEURO: no focal deficit; NL affect   EKG:     ASSESSMENT & PLAN:

## 2011-06-23 NOTE — Patient Instructions (Signed)
Continue all current medications. Your physician wants you to follow up in: 6 months.  You will receive a reminder letter in the mail one-two months in advance.  If you don't receive a letter, please call our office to schedule the follow up appointment   

## 2011-06-23 NOTE — Assessment & Plan Note (Signed)
Monitored and managed by Dr. Neita Carp. We'll request most recent lipid profile. Target LDL 70 or less, if feasible.

## 2011-06-23 NOTE — Assessment & Plan Note (Signed)
Very well controlled 

## 2011-06-23 NOTE — Assessment & Plan Note (Signed)
Adequate rate controlled on atenolol. On chronic Coumadin, followed in our clinic.

## 2011-06-28 ENCOUNTER — Ambulatory Visit (INDEPENDENT_AMBULATORY_CARE_PROVIDER_SITE_OTHER): Payer: Medicare Other | Admitting: *Deleted

## 2011-06-28 DIAGNOSIS — I4891 Unspecified atrial fibrillation: Secondary | ICD-10-CM

## 2011-06-28 DIAGNOSIS — Z7901 Long term (current) use of anticoagulants: Secondary | ICD-10-CM

## 2011-06-28 LAB — POCT INR: INR: 2.8

## 2011-07-19 ENCOUNTER — Encounter: Payer: Medicare Other | Admitting: *Deleted

## 2011-07-20 ENCOUNTER — Encounter: Payer: Self-pay | Admitting: *Deleted

## 2011-07-26 ENCOUNTER — Ambulatory Visit (INDEPENDENT_AMBULATORY_CARE_PROVIDER_SITE_OTHER): Payer: Medicare Other | Admitting: *Deleted

## 2011-07-26 DIAGNOSIS — Z7901 Long term (current) use of anticoagulants: Secondary | ICD-10-CM

## 2011-07-26 DIAGNOSIS — I4891 Unspecified atrial fibrillation: Secondary | ICD-10-CM

## 2011-08-01 ENCOUNTER — Other Ambulatory Visit: Payer: Self-pay | Admitting: Cardiology

## 2011-08-19 ENCOUNTER — Encounter: Payer: Medicare Other | Admitting: *Deleted

## 2011-08-22 ENCOUNTER — Other Ambulatory Visit: Payer: Self-pay | Admitting: Cardiology

## 2011-08-30 ENCOUNTER — Ambulatory Visit (INDEPENDENT_AMBULATORY_CARE_PROVIDER_SITE_OTHER): Payer: Medicare Other | Admitting: *Deleted

## 2011-08-30 DIAGNOSIS — Z7901 Long term (current) use of anticoagulants: Secondary | ICD-10-CM

## 2011-08-30 DIAGNOSIS — I4891 Unspecified atrial fibrillation: Secondary | ICD-10-CM | POA: Diagnosis not present

## 2011-08-30 DIAGNOSIS — E7800 Pure hypercholesterolemia, unspecified: Secondary | ICD-10-CM | POA: Diagnosis not present

## 2011-08-30 DIAGNOSIS — I10 Essential (primary) hypertension: Secondary | ICD-10-CM | POA: Diagnosis not present

## 2011-08-30 DIAGNOSIS — E782 Mixed hyperlipidemia: Secondary | ICD-10-CM | POA: Diagnosis not present

## 2011-09-05 DIAGNOSIS — I10 Essential (primary) hypertension: Secondary | ICD-10-CM | POA: Diagnosis not present

## 2011-09-05 DIAGNOSIS — M1990 Unspecified osteoarthritis, unspecified site: Secondary | ICD-10-CM | POA: Diagnosis not present

## 2011-09-05 DIAGNOSIS — N400 Enlarged prostate without lower urinary tract symptoms: Secondary | ICD-10-CM | POA: Diagnosis not present

## 2011-09-05 DIAGNOSIS — I209 Angina pectoris, unspecified: Secondary | ICD-10-CM | POA: Diagnosis not present

## 2011-09-05 DIAGNOSIS — E7800 Pure hypercholesterolemia, unspecified: Secondary | ICD-10-CM | POA: Diagnosis not present

## 2011-09-05 DIAGNOSIS — I4891 Unspecified atrial fibrillation: Secondary | ICD-10-CM | POA: Diagnosis not present

## 2011-09-05 DIAGNOSIS — M545 Low back pain: Secondary | ICD-10-CM | POA: Diagnosis not present

## 2011-09-05 DIAGNOSIS — G4700 Insomnia, unspecified: Secondary | ICD-10-CM | POA: Diagnosis not present

## 2011-09-27 ENCOUNTER — Ambulatory Visit (INDEPENDENT_AMBULATORY_CARE_PROVIDER_SITE_OTHER): Payer: Medicare Other | Admitting: *Deleted

## 2011-09-27 DIAGNOSIS — Z7901 Long term (current) use of anticoagulants: Secondary | ICD-10-CM

## 2011-09-27 DIAGNOSIS — I4891 Unspecified atrial fibrillation: Secondary | ICD-10-CM | POA: Diagnosis not present

## 2011-09-27 LAB — POCT INR: INR: 3.6

## 2011-10-06 ENCOUNTER — Ambulatory Visit (INDEPENDENT_AMBULATORY_CARE_PROVIDER_SITE_OTHER): Payer: Medicare Other | Admitting: *Deleted

## 2011-10-06 DIAGNOSIS — I4891 Unspecified atrial fibrillation: Secondary | ICD-10-CM

## 2011-10-06 DIAGNOSIS — Z7901 Long term (current) use of anticoagulants: Secondary | ICD-10-CM

## 2011-10-31 DIAGNOSIS — L600 Ingrowing nail: Secondary | ICD-10-CM | POA: Diagnosis not present

## 2011-11-01 ENCOUNTER — Ambulatory Visit (INDEPENDENT_AMBULATORY_CARE_PROVIDER_SITE_OTHER): Payer: Medicare Other | Admitting: *Deleted

## 2011-11-01 DIAGNOSIS — I4891 Unspecified atrial fibrillation: Secondary | ICD-10-CM | POA: Diagnosis not present

## 2011-11-01 DIAGNOSIS — Z7901 Long term (current) use of anticoagulants: Secondary | ICD-10-CM | POA: Diagnosis not present

## 2011-11-01 MED ORDER — WARFARIN SODIUM 3 MG PO TABS: 3.0000 mg | ORAL_TABLET | ORAL | Status: DC

## 2011-11-15 ENCOUNTER — Ambulatory Visit (INDEPENDENT_AMBULATORY_CARE_PROVIDER_SITE_OTHER): Payer: Medicare Other | Admitting: *Deleted

## 2011-11-15 DIAGNOSIS — I4891 Unspecified atrial fibrillation: Secondary | ICD-10-CM

## 2011-11-15 DIAGNOSIS — Z7901 Long term (current) use of anticoagulants: Secondary | ICD-10-CM

## 2011-11-15 LAB — POCT INR: INR: 2.9

## 2011-12-06 ENCOUNTER — Ambulatory Visit (INDEPENDENT_AMBULATORY_CARE_PROVIDER_SITE_OTHER): Payer: Medicare Other | Admitting: *Deleted

## 2011-12-06 DIAGNOSIS — Z7901 Long term (current) use of anticoagulants: Secondary | ICD-10-CM | POA: Diagnosis not present

## 2011-12-06 DIAGNOSIS — I4891 Unspecified atrial fibrillation: Secondary | ICD-10-CM

## 2011-12-22 ENCOUNTER — Encounter: Payer: Self-pay | Admitting: Cardiology

## 2011-12-22 ENCOUNTER — Ambulatory Visit (INDEPENDENT_AMBULATORY_CARE_PROVIDER_SITE_OTHER): Payer: Medicare Other | Admitting: Cardiology

## 2011-12-22 VITALS — BP 134/68 | HR 69 | Ht 72.0 in | Wt 186.0 lb

## 2011-12-22 DIAGNOSIS — R0602 Shortness of breath: Secondary | ICD-10-CM

## 2011-12-22 DIAGNOSIS — I2510 Atherosclerotic heart disease of native coronary artery without angina pectoris: Secondary | ICD-10-CM | POA: Diagnosis not present

## 2011-12-22 DIAGNOSIS — I4891 Unspecified atrial fibrillation: Secondary | ICD-10-CM | POA: Diagnosis not present

## 2011-12-22 DIAGNOSIS — Z7901 Long term (current) use of anticoagulants: Secondary | ICD-10-CM

## 2011-12-22 DIAGNOSIS — I4821 Permanent atrial fibrillation: Secondary | ICD-10-CM

## 2011-12-22 DIAGNOSIS — E7800 Pure hypercholesterolemia, unspecified: Secondary | ICD-10-CM

## 2011-12-22 NOTE — Patient Instructions (Signed)
   Echo If the results of your test are normal or stable, you will receive a letter.  If they are abnormal, the nurse will contact you by phone. Your physician wants you to follow up in: 6 months.  You will receive a reminder letter in the mail one-two months in advance.  If you don't receive a letter, please call our office to schedule the follow up appointment  

## 2011-12-26 DIAGNOSIS — N529 Male erectile dysfunction, unspecified: Secondary | ICD-10-CM | POA: Diagnosis not present

## 2011-12-26 DIAGNOSIS — N401 Enlarged prostate with lower urinary tract symptoms: Secondary | ICD-10-CM | POA: Diagnosis not present

## 2011-12-26 DIAGNOSIS — E291 Testicular hypofunction: Secondary | ICD-10-CM | POA: Diagnosis not present

## 2012-01-01 ENCOUNTER — Encounter: Payer: Self-pay | Admitting: Cardiology

## 2012-01-01 DIAGNOSIS — I4821 Permanent atrial fibrillation: Secondary | ICD-10-CM | POA: Insufficient documentation

## 2012-01-01 NOTE — Progress Notes (Signed)
Benjamin Bottoms, MD, [facility] ABIM Board Certified in Adult Cardiovascular Medicine,Internal Medicine and Critical Care Medicine    CC: followup patient with a history of permanent atrial fibrillation and coronary artery disease  HPI:  The patient is a 78 year old male with a history of permanent atrial fibrillation, coronary artery disease, hypertension and dyslipidemia.  The patient has no specific cardiac complaints.  He is engaged in a walking program.  She looks remarkably well for his age.  He denies any chest pain shortness of breath orthopnea or PND.  He reports no palpitations or syncope.  He has had a stress test several years ago but has had no recent assessment of his ejection fraction.  His lipid profile was followed and managed by Dr. Neita Carp.  PMH: reviewed and listed in Problem List in Electronic Records (and see below) Past Medical History  Diagnosis Date  . Permanent atrial fibrillation     on Coumadin therapy  . Pure hypercholesterolemia   . Anxiety state, unspecified   . Coronary atherosclerosis of native coronary artery     status post stent placement July and November 1995, Myoview 5 2011 within normal limits  . Congestive heart failure, unspecified   . Insomnia, unspecified   . Personal history of other disorder of urinary system    Past Surgical History  Procedure Date  . Left inguinal hernia   . Penile prosthesis implant     Allergies/SH/FHX : available in Electronic Records for review  No Known Allergies History   Social History  . Marital Status: Married    Spouse Name: N/A    Number of Children: 7  . Years of Education: N/A   Occupational History  . 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 Smoking status: Former Smoker -- 2.0 packs/day for 25 years    Types: Cigarettes    Quit date: 08/15/1962  . Smokeless tobacco: Never Used  . Alcohol Use: No  . Drug Use: No  . Sexually Active: Not on file   Other  Topics Concern  . Not on file   Social History Narrative  . No narrative on file   Family History  Problem Relation Age of Onset  . Heart attack Father     in his early 74's    Medications: Current Outpatient Prescriptions  Medication Sig Dispense Refill  . acetaminophen (TYLENOL ARTHRITIS PAIN) 650 MG CR tablet Take 1,300 mg by mouth 2 (two) times daily.       Marland Kitchen amLODipine (NORVASC) 5 MG tablet TAKE 1 TABLET ONCE DAILY  90 tablet  3  . aspirin 81 MG tablet Take 81 mg by mouth daily.        Marland Kitchen atenolol (TENORMIN) 50 MG tablet TAKE 1 TABLET ONCE DAILY  30 tablet  6  . finasteride (PROSCAR) 5 MG tablet Take 5 mg by mouth daily.        . Flavoring Agent (CHERRY FLAVOR) LIQD by Does not apply route as directed.        Marland Kitchen LORazepam (ATIVAN) 2 MG tablet Take 2 mg by mouth daily.        . Multiple Vitamins-Minerals (CENTRUM CARDIO PO) Take by mouth daily.        . simvastatin (ZOCOR) 20 MG tablet Take 20 mg by mouth at bedtime.        . Tamsulosin HCl (FLOMAX) 0.4 MG CAPS Take 0.4 mg by mouth daily.        Marland Kitchen  TESTOSTERONE IM Inject into the muscle every 14 (fourteen) days.        Marland Kitchen warfarin (COUMADIN) 3 MG tablet Take 1 tablet (3 mg total) by mouth as directed. Take 1 tablet daily except 2 tablets on Tuesdays  45 tablet  3    ROS: No nausea or vomiting. No fever or chills.No melena or hematochezia.No bleeding.No claudication  Physical Exam: BP 134/68  Pulse 69  Ht 6' (1.829 m)  Wt 186 lb (84.369 kg)  BMI 25.23 kg/m2 General:well-nourished male in no distress Neck:normal carotid upstroke and no carotid bruits.  No thyromegaly non-nodular thyroid.  Normal JVP Lungs:clear breath sounds bilaterally without any wheezing Cardiac:regular rate and rhythm with normal S1 and S2 and no murmur rubs or gallops Vascular:no edema.  Normal distal pulses Skin:warm and dry Physcologic:normal affect  12lead WUJ:WJXBJY fibrillation with rate controlled with a heart rate of 56 bpm and no significant  ST-T wave changes. Limited bedside ECHO:N/A No images are attached to the encounter.   Assessment and Plan  CORONARY ARTERY DISEASE No recurrent chest pain.  Continue medical therapy and no further ischemia workup required presently.  We will get an echocardiogram to assess LV function  Encounter for long-term (current) use of anticoagulants No bleeding complications.  HYPERCHOLESTEROLEMIA, MILD Lipid panel for by Dr. Neita Carp    Patient Active Problem List  Diagnoses  . HYPERCHOLESTEROLEMIA, MILD  . ANXIETY  . ESSENTIAL HYPERTENSION, BENIGN  . CORONARY ARTERY DISEASE  . ING HERN W/O MENTION OBST/GANGREN UNILAT/UNSPEC  . ERECTILE DYSFUNCTION, ORGANIC  . INSOMNIA UNSPECIFIED  . PERSONAL HISTORY OTHER DISORDER URINARY SYSTEM  . Encounter for long-term (current) use of anticoagulants  . Permanent atrial fibrillation

## 2012-01-01 NOTE — Assessment & Plan Note (Addendum)
No recurrent chest pain.  Continue medical therapy and no further ischemia workup required presently.  We will get an echocardiogram to assess LV function

## 2012-01-01 NOTE — Assessment & Plan Note (Signed)
No bleeding complications 

## 2012-01-01 NOTE — Assessment & Plan Note (Signed)
Lipid panel for by Dr. Neita Carp

## 2012-01-03 ENCOUNTER — Ambulatory Visit (INDEPENDENT_AMBULATORY_CARE_PROVIDER_SITE_OTHER): Payer: Medicare Other | Admitting: *Deleted

## 2012-01-03 DIAGNOSIS — I4891 Unspecified atrial fibrillation: Secondary | ICD-10-CM | POA: Diagnosis not present

## 2012-01-03 DIAGNOSIS — Z7901 Long term (current) use of anticoagulants: Secondary | ICD-10-CM

## 2012-01-05 DIAGNOSIS — I10 Essential (primary) hypertension: Secondary | ICD-10-CM | POA: Diagnosis not present

## 2012-01-05 DIAGNOSIS — N400 Enlarged prostate without lower urinary tract symptoms: Secondary | ICD-10-CM | POA: Diagnosis not present

## 2012-01-05 DIAGNOSIS — N529 Male erectile dysfunction, unspecified: Secondary | ICD-10-CM | POA: Diagnosis not present

## 2012-01-05 DIAGNOSIS — E7800 Pure hypercholesterolemia, unspecified: Secondary | ICD-10-CM | POA: Diagnosis not present

## 2012-01-05 DIAGNOSIS — E782 Mixed hyperlipidemia: Secondary | ICD-10-CM | POA: Diagnosis not present

## 2012-01-12 ENCOUNTER — Other Ambulatory Visit: Payer: Medicare Other

## 2012-01-12 DIAGNOSIS — E7800 Pure hypercholesterolemia, unspecified: Secondary | ICD-10-CM | POA: Diagnosis not present

## 2012-01-12 DIAGNOSIS — I10 Essential (primary) hypertension: Secondary | ICD-10-CM | POA: Diagnosis not present

## 2012-01-12 DIAGNOSIS — N400 Enlarged prostate without lower urinary tract symptoms: Secondary | ICD-10-CM | POA: Diagnosis not present

## 2012-01-12 DIAGNOSIS — I209 Angina pectoris, unspecified: Secondary | ICD-10-CM | POA: Diagnosis not present

## 2012-01-12 DIAGNOSIS — M545 Low back pain: Secondary | ICD-10-CM | POA: Diagnosis not present

## 2012-01-12 DIAGNOSIS — M1990 Unspecified osteoarthritis, unspecified site: Secondary | ICD-10-CM | POA: Diagnosis not present

## 2012-01-12 DIAGNOSIS — I4891 Unspecified atrial fibrillation: Secondary | ICD-10-CM | POA: Diagnosis not present

## 2012-01-12 DIAGNOSIS — G4700 Insomnia, unspecified: Secondary | ICD-10-CM | POA: Diagnosis not present

## 2012-01-13 ENCOUNTER — Other Ambulatory Visit: Payer: Self-pay | Admitting: Cardiology

## 2012-01-16 ENCOUNTER — Other Ambulatory Visit: Payer: Self-pay | Admitting: Family Medicine

## 2012-01-19 ENCOUNTER — Other Ambulatory Visit: Payer: Medicare Other

## 2012-01-24 DIAGNOSIS — S60469A Insect bite (nonvenomous) of unspecified finger, initial encounter: Secondary | ICD-10-CM | POA: Diagnosis not present

## 2012-01-24 DIAGNOSIS — L03119 Cellulitis of unspecified part of limb: Secondary | ICD-10-CM | POA: Diagnosis not present

## 2012-01-24 DIAGNOSIS — L02419 Cutaneous abscess of limb, unspecified: Secondary | ICD-10-CM | POA: Diagnosis not present

## 2012-01-26 ENCOUNTER — Other Ambulatory Visit: Payer: Self-pay

## 2012-01-26 ENCOUNTER — Other Ambulatory Visit (INDEPENDENT_AMBULATORY_CARE_PROVIDER_SITE_OTHER): Payer: Medicare Other

## 2012-01-26 DIAGNOSIS — R0602 Shortness of breath: Secondary | ICD-10-CM

## 2012-01-26 DIAGNOSIS — I2510 Atherosclerotic heart disease of native coronary artery without angina pectoris: Secondary | ICD-10-CM

## 2012-01-26 DIAGNOSIS — I4891 Unspecified atrial fibrillation: Secondary | ICD-10-CM | POA: Diagnosis not present

## 2012-01-31 ENCOUNTER — Ambulatory Visit (INDEPENDENT_AMBULATORY_CARE_PROVIDER_SITE_OTHER): Payer: Medicare Other | Admitting: *Deleted

## 2012-01-31 DIAGNOSIS — I4891 Unspecified atrial fibrillation: Secondary | ICD-10-CM

## 2012-01-31 DIAGNOSIS — Z7901 Long term (current) use of anticoagulants: Secondary | ICD-10-CM | POA: Diagnosis not present

## 2012-01-31 LAB — POCT INR: INR: 3.8

## 2012-02-02 ENCOUNTER — Telehealth: Payer: Self-pay | Admitting: *Deleted

## 2012-02-02 NOTE — Telephone Encounter (Signed)
Notes Recorded by Lesle Chris, LPN on 09/02/1476 at 2:27 PM Patient notified and verbalized understanding.

## 2012-02-02 NOTE — Telephone Encounter (Signed)
Message copied by Lesle Chris on Thu Feb 02, 2012  2:27 PM ------      Message from: Lewayne Bunting E      Created: Wed Feb 01, 2012  6:56 AM       Normal LV function, but moderate regurgitation of aortic , mitral and tricuspid regurgitation but asymptomatic. No change in therapy.

## 2012-02-14 ENCOUNTER — Ambulatory Visit (INDEPENDENT_AMBULATORY_CARE_PROVIDER_SITE_OTHER): Payer: Medicare Other | Admitting: *Deleted

## 2012-02-14 DIAGNOSIS — I4891 Unspecified atrial fibrillation: Secondary | ICD-10-CM

## 2012-02-14 DIAGNOSIS — Z7901 Long term (current) use of anticoagulants: Secondary | ICD-10-CM

## 2012-02-14 LAB — POCT INR: INR: 2

## 2012-02-15 DIAGNOSIS — S6990XA Unspecified injury of unspecified wrist, hand and finger(s), initial encounter: Secondary | ICD-10-CM | POA: Diagnosis not present

## 2012-02-15 DIAGNOSIS — S6980XA Other specified injuries of unspecified wrist, hand and finger(s), initial encounter: Secondary | ICD-10-CM | POA: Diagnosis not present

## 2012-02-21 DIAGNOSIS — M79609 Pain in unspecified limb: Secondary | ICD-10-CM | POA: Diagnosis not present

## 2012-02-21 DIAGNOSIS — L600 Ingrowing nail: Secondary | ICD-10-CM | POA: Diagnosis not present

## 2012-03-13 ENCOUNTER — Ambulatory Visit (INDEPENDENT_AMBULATORY_CARE_PROVIDER_SITE_OTHER): Payer: Medicare Other | Admitting: *Deleted

## 2012-03-13 DIAGNOSIS — I4891 Unspecified atrial fibrillation: Secondary | ICD-10-CM | POA: Diagnosis not present

## 2012-03-13 DIAGNOSIS — Z7901 Long term (current) use of anticoagulants: Secondary | ICD-10-CM

## 2012-03-13 LAB — POCT INR: INR: 2

## 2012-04-10 ENCOUNTER — Ambulatory Visit (INDEPENDENT_AMBULATORY_CARE_PROVIDER_SITE_OTHER): Payer: Medicare Other | Admitting: *Deleted

## 2012-04-10 DIAGNOSIS — Z7901 Long term (current) use of anticoagulants: Secondary | ICD-10-CM

## 2012-04-10 DIAGNOSIS — I4891 Unspecified atrial fibrillation: Secondary | ICD-10-CM | POA: Diagnosis not present

## 2012-04-25 ENCOUNTER — Other Ambulatory Visit: Payer: Self-pay | Admitting: Cardiology

## 2012-05-01 DIAGNOSIS — Z23 Encounter for immunization: Secondary | ICD-10-CM | POA: Diagnosis not present

## 2012-05-11 ENCOUNTER — Ambulatory Visit (INDEPENDENT_AMBULATORY_CARE_PROVIDER_SITE_OTHER): Payer: Medicare Other | Admitting: *Deleted

## 2012-05-11 DIAGNOSIS — Z7901 Long term (current) use of anticoagulants: Secondary | ICD-10-CM

## 2012-05-11 DIAGNOSIS — I10 Essential (primary) hypertension: Secondary | ICD-10-CM | POA: Diagnosis not present

## 2012-05-11 DIAGNOSIS — I4891 Unspecified atrial fibrillation: Secondary | ICD-10-CM

## 2012-05-11 DIAGNOSIS — M545 Low back pain: Secondary | ICD-10-CM | POA: Diagnosis not present

## 2012-05-11 DIAGNOSIS — E538 Deficiency of other specified B group vitamins: Secondary | ICD-10-CM | POA: Diagnosis not present

## 2012-05-11 DIAGNOSIS — R5381 Other malaise: Secondary | ICD-10-CM | POA: Diagnosis not present

## 2012-05-11 DIAGNOSIS — E7800 Pure hypercholesterolemia, unspecified: Secondary | ICD-10-CM | POA: Diagnosis not present

## 2012-05-15 DIAGNOSIS — M1990 Unspecified osteoarthritis, unspecified site: Secondary | ICD-10-CM | POA: Diagnosis not present

## 2012-05-15 DIAGNOSIS — I209 Angina pectoris, unspecified: Secondary | ICD-10-CM | POA: Diagnosis not present

## 2012-05-15 DIAGNOSIS — I10 Essential (primary) hypertension: Secondary | ICD-10-CM | POA: Diagnosis not present

## 2012-05-15 DIAGNOSIS — E7800 Pure hypercholesterolemia, unspecified: Secondary | ICD-10-CM | POA: Diagnosis not present

## 2012-05-15 DIAGNOSIS — G4700 Insomnia, unspecified: Secondary | ICD-10-CM | POA: Diagnosis not present

## 2012-05-15 DIAGNOSIS — K589 Irritable bowel syndrome without diarrhea: Secondary | ICD-10-CM | POA: Diagnosis not present

## 2012-05-15 DIAGNOSIS — I4891 Unspecified atrial fibrillation: Secondary | ICD-10-CM | POA: Diagnosis not present

## 2012-05-15 DIAGNOSIS — N400 Enlarged prostate without lower urinary tract symptoms: Secondary | ICD-10-CM | POA: Diagnosis not present

## 2012-06-01 ENCOUNTER — Ambulatory Visit (INDEPENDENT_AMBULATORY_CARE_PROVIDER_SITE_OTHER): Payer: Medicare Other | Admitting: *Deleted

## 2012-06-01 DIAGNOSIS — I4891 Unspecified atrial fibrillation: Secondary | ICD-10-CM

## 2012-06-01 DIAGNOSIS — Z7901 Long term (current) use of anticoagulants: Secondary | ICD-10-CM | POA: Diagnosis not present

## 2012-06-15 ENCOUNTER — Ambulatory Visit (INDEPENDENT_AMBULATORY_CARE_PROVIDER_SITE_OTHER): Payer: Medicare Other | Admitting: *Deleted

## 2012-06-15 DIAGNOSIS — I4891 Unspecified atrial fibrillation: Secondary | ICD-10-CM | POA: Diagnosis not present

## 2012-06-15 DIAGNOSIS — Z7901 Long term (current) use of anticoagulants: Secondary | ICD-10-CM | POA: Diagnosis not present

## 2012-07-09 DIAGNOSIS — R1033 Periumbilical pain: Secondary | ICD-10-CM | POA: Diagnosis not present

## 2012-07-09 DIAGNOSIS — K589 Irritable bowel syndrome without diarrhea: Secondary | ICD-10-CM | POA: Diagnosis not present

## 2012-07-11 ENCOUNTER — Other Ambulatory Visit: Payer: Self-pay | Admitting: Physician Assistant

## 2012-07-11 ENCOUNTER — Encounter (INDEPENDENT_AMBULATORY_CARE_PROVIDER_SITE_OTHER): Payer: Self-pay | Admitting: *Deleted

## 2012-07-17 ENCOUNTER — Ambulatory Visit (INDEPENDENT_AMBULATORY_CARE_PROVIDER_SITE_OTHER): Payer: Medicare Other | Admitting: *Deleted

## 2012-07-17 DIAGNOSIS — Z7901 Long term (current) use of anticoagulants: Secondary | ICD-10-CM | POA: Diagnosis not present

## 2012-07-17 DIAGNOSIS — I4891 Unspecified atrial fibrillation: Secondary | ICD-10-CM | POA: Diagnosis not present

## 2012-07-17 LAB — POCT INR: INR: 2.8

## 2012-07-19 ENCOUNTER — Ambulatory Visit (INDEPENDENT_AMBULATORY_CARE_PROVIDER_SITE_OTHER): Payer: Medicare Other | Admitting: Internal Medicine

## 2012-07-19 ENCOUNTER — Telehealth (INDEPENDENT_AMBULATORY_CARE_PROVIDER_SITE_OTHER): Payer: Self-pay | Admitting: *Deleted

## 2012-07-19 ENCOUNTER — Other Ambulatory Visit (INDEPENDENT_AMBULATORY_CARE_PROVIDER_SITE_OTHER): Payer: Self-pay | Admitting: *Deleted

## 2012-07-19 ENCOUNTER — Encounter (INDEPENDENT_AMBULATORY_CARE_PROVIDER_SITE_OTHER): Payer: Self-pay | Admitting: Internal Medicine

## 2012-07-19 VITALS — BP 100/74 | HR 60 | Temp 98.0°F | Ht 72.0 in | Wt 190.6 lb

## 2012-07-19 DIAGNOSIS — R1310 Dysphagia, unspecified: Secondary | ICD-10-CM | POA: Diagnosis not present

## 2012-07-19 DIAGNOSIS — Z1211 Encounter for screening for malignant neoplasm of colon: Secondary | ICD-10-CM

## 2012-07-19 DIAGNOSIS — K219 Gastro-esophageal reflux disease without esophagitis: Secondary | ICD-10-CM

## 2012-07-19 MED ORDER — PEG-KCL-NACL-NASULF-NA ASC-C 100 G PO SOLR: 1.0000 | Freq: Once | ORAL | Status: DC

## 2012-07-19 NOTE — Patient Instructions (Addendum)
Colonoscopy.  The risks and benefits such as perforation, bleeding, and infection were reviewed with the patient and is agreeable. 

## 2012-07-19 NOTE — Progress Notes (Signed)
Subjective:     Patient ID: Benjamin Mason, male   DOB: Feb 09, 1931, 78 y.o.   MRN: [id number]  HPIReferred to our office by Dr. Neita Carp for abdominal pian. He tells me one night he had a severe lower abdominal pain.  He tells me he has IBS. He has had some lower abdominal pain once every other month. BM relieves the pain. The pain was not related to having a BM but was relieved having a BM.  No fever or rectal bleeding. His last colonoscopy 6-7 yrs ago in [place] and was normal.  He was advised to return in 5 yrs. Did not have a polyp. ? Inflammation in his colon. He has a BM usually daily and sometimes every 2-3 days. He takes Metamucil daily. apetite is good. No weight loss. No melena or bright red rectal bleeding. He also c/o some dysphagia to solids and would like an EGD with colonoscopy 04/22/2011 H and H 15.5 and 46.5, MCV 109, , platelets 174.  Review of Systems see hpi Current Outpatient Prescriptions  Medication Sig Dispense Refill  . acetaminophen (TYLENOL ARTHRITIS PAIN) 650 MG CR tablet Take 1,300 mg by mouth 2 (two) times daily.       Marland Kitchen amLODipine (NORVASC) 5 MG tablet TAKE 1 TABLET ONCE DAILY  90 tablet  3  . aspirin 81 MG tablet Take 81 mg by mouth daily.        Marland Kitchen atenolol (TENORMIN) 50 MG tablet TAKE 1 TABLET ONCE DAILY  30 tablet  6  . finasteride (PROSCAR) 5 MG tablet Take 5 mg by mouth daily.        Marland Kitchen LORazepam (ATIVAN) 2 MG tablet Take 2 mg by mouth daily.        . Multiple Vitamins-Minerals (CENTRUM CARDIO PO) Take by mouth daily.        . simvastatin (ZOCOR) 20 MG tablet Take 20 mg by mouth at bedtime.        . Tamsulosin HCl (FLOMAX) 0.4 MG CAPS Take 0.4 mg by mouth daily.        . TESTOSTERONE IM Inject into the muscle every 14 (fourteen) days.        Marland Kitchen warfarin (COUMADIN) 3 MG tablet TAKE ONE TABLET BY MOUTH DAILY EXCEPT TWO TABLETS ON TUESDAYS.  45 tablet  3  . Flavoring Agent (CHERRY FLAVOR) LIQD by Does not apply route as directed.         Past Medical History   Diagnosis Date  . Permanent atrial fibrillation     on Coumadin therapy  . Pure hypercholesterolemia   . Anxiety state, unspecified   . Coronary atherosclerosis of native coronary artery     status post stent placement July and November 1995, Myoview 5 2011 within normal limits  . Congestive heart failure, unspecified   . Insomnia, unspecified   . Personal history of other disorder of urinary system    Past Surgical History  Procedure Date  . Left inguinal hernia   . Penile prosthesis implant    No Known Allergies      Objective:   Physical Exam  Filed Vitals:   07/19/12 1048  BP: 100/74  Pulse: 60  Temp: 98 F (36.7 C)  Height: 6' (1.829 m)  Weight: 190 lb 9.6 oz (86.456 kg)   Alert and oriented. Skin warm and dry. Oral mucosa is moist.   . Sclera anicteric, conjunctivae is pink. Thyroid not enlarged. No cervical lymphadenopathy. Lungs clear. Heart regular rate and rhythm.  Abdomen  is soft. Bowel sounds are positive. No hepatomegaly. No abdominal masses felt. No tenderness.  No edema to lower extremities.       Assessment:    Lower abdominal pain occuring every 1-2 months. Relieved with BMs.  Screening colonoscopy Dysphagia to solids.    Plan:     Colonoscopy with Dr. Karilyn Cota. The risks and benefits such as perforation, bleeding, and infection were reviewed with the patient and is agreeable.  EGD/ED

## 2012-07-19 NOTE — Telephone Encounter (Signed)
Patient needs movi prep 

## 2012-07-25 ENCOUNTER — Encounter (HOSPITAL_COMMUNITY): Payer: Self-pay | Admitting: Pharmacy Technician

## 2012-07-26 ENCOUNTER — Encounter: Payer: Self-pay | Admitting: Cardiology

## 2012-07-26 ENCOUNTER — Ambulatory Visit (INDEPENDENT_AMBULATORY_CARE_PROVIDER_SITE_OTHER): Payer: Medicare Other | Admitting: Cardiology

## 2012-07-26 VITALS — BP 129/73 | HR 56 | Ht 72.0 in | Wt 191.0 lb

## 2012-07-26 DIAGNOSIS — E7800 Pure hypercholesterolemia, unspecified: Secondary | ICD-10-CM

## 2012-07-26 DIAGNOSIS — I4821 Permanent atrial fibrillation: Secondary | ICD-10-CM

## 2012-07-26 DIAGNOSIS — I4891 Unspecified atrial fibrillation: Secondary | ICD-10-CM | POA: Diagnosis not present

## 2012-07-26 DIAGNOSIS — I2510 Atherosclerotic heart disease of native coronary artery without angina pectoris: Secondary | ICD-10-CM

## 2012-07-26 NOTE — Patient Instructions (Signed)
   Stop Aspirin Continue all other current medications. Echo - Due in June  Your physician wants you to follow up in: 6 months.  You will receive a reminder letter in the mail one-two months in advance.  If you don't receive a letter, please call our office to schedule the follow up appointment

## 2012-07-26 NOTE — Progress Notes (Signed)
Patient ID: Benjamin Mason, male   DOB: October 10, 1930, 78 y.o.   MRN: [id number] PCP: Dr. Neita Carp  78 yo with history of permanent atrial fibrillation and CAD returns for cardiology followup.  He has been doing well since last appointment.  No chest pain, no exertional dyspnea.  He is active and does yardwork.  No palpitations, lightheadedness, syncope.  HR is well-controlled with atenolol.  He will need a colonoscopy soon.  He is on coumadin with no bleeding complications.  He is steady on his feet with no falls.   PMH: 1. Atrial fibrillation: Permanent, on coumadin.  2. HTN 3. CAD: PCI in 7/95, PCI in 11/95.  Myoview in 5/11 showed no ischemia or infarction.  4. BPH 5. IBS 6. Valvular heart disease: Echo (6/13) with EF 50-55%, mild LVH, mild to moderate AI, moderate MR, moderate TR, peak RV-RA gradient 26 mmHg.   SH: Lives in [place], married, 7 children.  Owns [employer], environmental.  Quit smoking in 1964.   FH: No premature CAD.   ROS: All systems reviewed and negative except as per HPI.   Current Outpatient Prescriptions  Medication Sig Dispense Refill  . acetaminophen (TYLENOL ARTHRITIS PAIN) 650 MG CR tablet Take 650 mg by mouth 2 (two) times daily.       Marland Kitchen amLODipine (NORVASC) 5 MG tablet TAKE 1 TABLET ONCE DAILY  90 tablet  3  . atenolol (TENORMIN) 50 MG tablet TAKE 1 TABLET ONCE DAILY  30 tablet  6  . finasteride (PROSCAR) 5 MG tablet Take 5 mg by mouth daily.        Marland Kitchen LORazepam (ATIVAN) 2 MG tablet Take 2 mg by mouth daily.        . Multiple Vitamins-Minerals (CENTRUM CARDIO PO) Take 1 tablet by mouth daily.       . peg 3350 powder (MOVIPREP) 100 G SOLR Take 1 kit (100 g total) by mouth once.  1 kit  0  . simvastatin (ZOCOR) 20 MG tablet Take 10 mg by mouth at bedtime.       . Tamsulosin HCl (FLOMAX) 0.4 MG CAPS Take 0.4 mg by mouth daily.        . TESTOSTERONE IM Inject 1 mL into the muscle every 14 (fourteen) days.       Marland Kitchen warfarin (COUMADIN) 3 MG tablet Take 3-4.5 mg by mouth  daily. 1 tablet daily except on MWF - takes 1 1/2 tablets.      . [EXPIRED] warfarin (COUMADIN) 3 MG tablet         BP 129/73  Pulse 56  Ht 6' (1.829 m)  Wt 191 lb (86.637 kg)  BMI 25.90 kg/m2 General: NAD Neck: No JVD, no thyromegaly or thyroid nodule.  Lungs: Clear to auscultation bilaterally with normal respiratory effort. CV: Nondisplaced PMI.  Heart irregular S1/S2, no S3/S4, 1/6 HSM LLSB.  No peripheral edema.  No carotid bruit.  Normal pedal pulses.  Abdomen: Soft, nontender, no hepatosplenomegaly, no distention.  Neurologic: Alert and oriented x 3.  Psych: Normal affect. Extremities: No clubbing or cyanosis.   Assessment/Plan: 1. Atrial fibrillation: Permanent.  Good rate control on atenolol.  Tolerating warfarin with no problems.  He has no history of CVA.  It would be ok for him to hold coumadin for colonoscopy.  2. CAD: Stable with no ischemic symptoms.  Continue statin and ACEI.  It would be ok for him to stop ASA 81 as he has stable CAD and is also on coumadin.  3. Valvular  heart disease: Mild to moderate AI, moderate MR, moderate TR on last echo.  He does not have a prominent murmur on exam.  No exertional dyspnea, no lightheadedness.  I will repeat an echo in 6/14 to follow.  4. Hyperlipidemia: Recent labs done at PCP's office, I will try to get a copy of the lipids.   Marca Ancona 07/26/2012 12:46 PM

## 2012-07-30 ENCOUNTER — Encounter (INDEPENDENT_AMBULATORY_CARE_PROVIDER_SITE_OTHER): Payer: Self-pay

## 2012-08-03 ENCOUNTER — Ambulatory Visit: Admit: 2012-08-03 | Payer: Self-pay | Admitting: Internal Medicine

## 2012-08-03 ENCOUNTER — Encounter (HOSPITAL_COMMUNITY): Payer: Self-pay | Admitting: *Deleted

## 2012-08-03 ENCOUNTER — Encounter (HOSPITAL_COMMUNITY)
Admission: RE | Disposition: A | Discharge status: Home / Self Care | Payer: Self-pay | Source: Ambulatory Visit | Attending: Internal Medicine

## 2012-08-03 ENCOUNTER — Ambulatory Visit (HOSPITAL_COMMUNITY)
Admission: RE | Admit: 2012-08-03 | Discharge: 2012-08-03 | Disposition: A | Discharge status: Home / Self Care | Payer: Medicare Other | Source: Ambulatory Visit | Attending: Internal Medicine | Admitting: Internal Medicine

## 2012-08-03 DIAGNOSIS — K219 Gastro-esophageal reflux disease without esophagitis: Secondary | ICD-10-CM

## 2012-08-03 DIAGNOSIS — R1310 Dysphagia, unspecified: Secondary | ICD-10-CM | POA: Insufficient documentation

## 2012-08-03 DIAGNOSIS — K5730 Diverticulosis of large intestine without perforation or abscess without bleeding: Secondary | ICD-10-CM | POA: Diagnosis not present

## 2012-08-03 DIAGNOSIS — K449 Diaphragmatic hernia without obstruction or gangrene: Secondary | ICD-10-CM | POA: Diagnosis not present

## 2012-08-03 DIAGNOSIS — Z1211 Encounter for screening for malignant neoplasm of colon: Secondary | ICD-10-CM | POA: Diagnosis not present

## 2012-08-03 DIAGNOSIS — K225 Diverticulum of esophagus, acquired: Secondary | ICD-10-CM

## 2012-08-03 DIAGNOSIS — D126 Benign neoplasm of colon, unspecified: Secondary | ICD-10-CM | POA: Diagnosis not present

## 2012-08-03 HISTORY — PX: COLONOSCOPY WITH ESOPHAGOGASTRODUODENOSCOPY (EGD): SHX5779

## 2012-08-03 HISTORY — PX: SAVORY DILATION: SHX5439

## 2012-08-03 HISTORY — PX: BALLOON DILATION: SHX5330

## 2012-08-03 HISTORY — PX: MALONEY DILATION: SHX5535

## 2012-08-03 SURGERY — COLONOSCOPY
Anesthesia: Moderate Sedation

## 2012-08-03 SURGERY — COLONOSCOPY WITH ESOPHAGOGASTRODUODENOSCOPY (EGD)
Anesthesia: Moderate Sedation

## 2012-08-03 MED ORDER — STERILE WATER FOR IRRIGATION IR SOLN
Status: DC | PRN
  Administered 2012-08-03: 14:00:00

## 2012-08-03 MED ORDER — BUTAMBEN-TETRACAINE-BENZOCAINE 2-2-14 % EX AERO
INHALATION_SPRAY | CUTANEOUS | Status: DC | PRN
  Administered 2012-08-03: 2 via TOPICAL

## 2012-08-03 MED ORDER — MEPERIDINE HCL 50 MG/ML IJ SOLN
INTRAMUSCULAR | Status: DC | PRN
  Administered 2012-08-03 (×2): 25 mg via INTRAVENOUS

## 2012-08-03 MED ORDER — SODIUM CHLORIDE 0.45 % IV SOLN
INTRAVENOUS | Status: DC
  Administered 2012-08-03: 1000 mL via INTRAVENOUS

## 2012-08-03 MED ORDER — MIDAZOLAM HCL 5 MG/5ML IJ SOLN
INTRAMUSCULAR | Status: DC | PRN
  Administered 2012-08-03 (×5): 2 mg via INTRAVENOUS

## 2012-08-03 NOTE — H&P (Signed)
Benjamin Mason is an 78 y.o. male.   Chief Complaint: Patient is here for EGD, ED and colonoscopy. HPI: Patient is 78 year old Caucasian male who presents with history of intermittent solid food dysphagia. He has heartburn usually with fatty foods she does not eat that he often. Heartburn is particularly with antacids. He denies melena or rectal bleeding. He has sporadic lower abdominal pain. He had so discharge as was a thorough office. He denies diarrhea and constipation. He has good appetite and his weight has been stable. He does not take NSAIDs. He's been off warfarin for few days.  Past Medical History  Diagnosis Date  . Permanent atrial fibrillation     on Coumadin therapy  . Pure hypercholesterolemia   . Anxiety state, unspecified   . Coronary atherosclerosis of native coronary artery     status post stent placement July and November 1995, Myoview 5 2011 within normal limits  . Congestive heart failure, unspecified   . Insomnia, unspecified   . Personal history of other disorder of urinary system     Past Surgical History  Procedure Date  . Left inguinal hernia   . Penile prosthesis implant     Family History  Problem Relation Age of Onset  . Heart attack Father     in his early 72's   Social History:  reports that he quit smoking about 50 years ago. His smoking use included Cigarettes. He has a 50 pack-year smoking history. He has never used smokeless tobacco. He reports that he does not drink alcohol or use illicit drugs.  Allergies: No Known Allergies  Medications Prior to Admission  Medication Sig Dispense Refill  . acetaminophen (TYLENOL ARTHRITIS PAIN) 650 MG CR tablet Take 650 mg by mouth 2 (two) times daily.       Marland Kitchen amLODipine (NORVASC) 5 MG tablet TAKE 1 TABLET ONCE DAILY  90 tablet  3  . atenolol (TENORMIN) 50 MG tablet TAKE 1 TABLET ONCE DAILY  30 tablet  6  . finasteride (PROSCAR) 5 MG tablet Take 5 mg by mouth daily.        Marland Kitchen LORazepam (ATIVAN) 2 MG  tablet Take 2 mg by mouth daily.        . Multiple Vitamins-Minerals (CENTRUM CARDIO PO) Take 1 tablet by mouth daily.       . peg 3350 powder (MOVIPREP) 100 G SOLR Take 1 kit (100 g total) by mouth once.  1 kit  0  . simvastatin (ZOCOR) 20 MG tablet Take 10 mg by mouth at bedtime.       . Tamsulosin HCl (FLOMAX) 0.4 MG CAPS Take 0.4 mg by mouth daily.        . TESTOSTERONE IM Inject 1 mL into the muscle every 14 (fourteen) days.       Marland Kitchen warfarin (COUMADIN) 3 MG tablet Take 3-4.5 mg by mouth daily. 1 tablet daily except on MWF - takes 1 1/2 tablets.        No results found for this or any previous visit (from the past 48 hour(s)). No results found.  ROS  Blood pressure 126/73, pulse 70, temperature 97.8 F (36.6 C), temperature source Oral, resp. rate 15, height 6' (1.829 m), weight 180 lb (81.647 kg). Physical Exam  Constitutional: He appears well-developed and well-nourished.  HENT:  Mouth/Throat: Oropharynx is clear and moist.  Eyes: Conjunctivae normal are normal. No scleral icterus.  Neck: No thyromegaly present.  Cardiovascular:       Irregular rhythm normal  S1 and S2. No murmur noted.  Respiratory: Effort normal and breath sounds normal.  GI: Soft. Bowel sounds are normal. He exhibits no distension and no mass. There is no tenderness.  Musculoskeletal: Edema: trace edema around ankles.  Lymphadenopathy:    He has no cervical adenopathy.  Neurological: He is alert.  Skin: Skin is warm and dry.     Assessment/Plan Dysphagia. EGD possible ED and screening colonoscopy.  Benjamin Mason U 08/03/2012, 2:15 PM

## 2012-08-03 NOTE — Op Note (Signed)
EGD PROCEDURE REPORT  PATIENT:  Benjamin Mason  MR#:  [id number] Birthdate:  05-01-1931, 78 y.o., male Endoscopist:  Dr. Malissa Hippo, MD Referred By:  Dr. Fara Chute, MD Procedure Date: 08/03/2012  Procedure:   EGD, ED & Colonoscopy.  Indications:  Patient is 78 year old Caucasian male who presents with intermittent solid food dysphagia. He is also undergoing colonoscopy primarily for screening purposes. He did have an episode of lower abdominal pain that woke him up middle of night few weeks ago with it did not last long. Patient has been off his warfarin for the last 4 days.            Informed Consent:  The risks, benefits, alternatives & imponderables which include, but are not limited to, bleeding, infection, perforation, drug reaction and potential missed lesion have been reviewed.  The potential for biopsy, lesion removal, esophageal dilation, etc. have also been discussed.  Questions have been answered.  All parties agreeable.  Please see history & physical in medical record for more information.  Medications:  Demerol 50 mg IV Versed 10 mg IV Cetacaine spray topically for oropharyngeal anesthesia  EGD  Description of procedure:  The endoscope was introduced through the mouth and advanced to the second portion of the duodenum without difficulty or limitations. The mucosal surfaces were surveyed very carefully during advancement of the scope and upon withdrawal.  Findings:  Esophagus:  Normal mucosa of the esophagus. No esophageal diverticula which was noted the scope was reintroduced post dilation. No ring or stricture noted. GEJ:  37 cm Hiatus:  40 cm Stomach:  Stomach was empty and distended very well with insufflation. Folds in the proximal stomach are normal. Examination of mucosa at body, antrum, pyloric channel, angularis fundus and cardia was normal. Duodenum:  Normal bulbar and post bulbar mucosa.  Therapeutic/Diagnostic Maneuvers Performed:  Esophagus dilated by  passing 54 French Maloney dilator but no mucosal disruption induced on examination post dilation and distal esophageal diverticulum was identified.  COLONOSCOPY Description of procedure:  After a digital rectal exam was performed, that colonoscope was advanced from the anus through the rectum and colon to the area of the cecum, ileocecal valve and appendiceal orifice. The cecum was deeply intubated. These structures were well-seen and photographed for the record. From the level of the cecum and ileocecal valve, the scope was slowly and cautiously withdrawn. The mucosal surfaces were carefully surveyed utilizing scope tip to flexion to facilitate fold flattening as needed. The scope was pulled down into the rectum where a thorough exam including retroflexion was performed.  Findings:   Prep satisfactory. Multiple large diverticula at sigmoid and descending colon. Small polyp ablated via cold biopsy and splenic flexure. Another small polyp was noted in the region of splenic flexure but could not be found on the way out. Normal rectal mucosa and anal rectal junction.  Therapeutic/Diagnostic Maneuvers Performed:  See above  Complications:  None  Cecal Withdrawal Time:  15 minutes  Impression:  Single diverticula noted at distal esophagus(on second look post dilation). Esophagus dilated by passing 54 French Maloney dilator but no mucosal disruption induced. Small sliding hiatal hernia. Colonoscopy compared to cecum with removal of small polyp from splenic flexure. Another polyp in the segment was not found on the way out. Multiple diverticula at sigmoid and descending colon.   Recommendations:  Standard instructions given. Patient will resume warfarin at usual dose get INR checked and of the month as planned. He can take Zantac OTC, Pepcid OTC or  Prevacid but should not use Prilosec because of interaction with warfarin. I will contact patient with results of biopsy. He may consider next  exam in 5 years.  REHMAN,NAJEEB U  08/03/2012 3:29 PM  CC: Dr. Estanislado Pandy, MD & Dr. Bonnetta Barry ref. provider found

## 2012-08-10 ENCOUNTER — Encounter (HOSPITAL_COMMUNITY): Payer: Self-pay | Admitting: Internal Medicine

## 2012-08-14 ENCOUNTER — Ambulatory Visit (INDEPENDENT_AMBULATORY_CARE_PROVIDER_SITE_OTHER): Payer: Medicare Other | Admitting: *Deleted

## 2012-08-14 DIAGNOSIS — Z7901 Long term (current) use of anticoagulants: Secondary | ICD-10-CM

## 2012-08-14 DIAGNOSIS — I4891 Unspecified atrial fibrillation: Secondary | ICD-10-CM

## 2012-08-14 LAB — POCT INR: INR: 1.9

## 2012-08-16 ENCOUNTER — Ambulatory Visit: Payer: Medicare Other | Admitting: Cardiology

## 2012-08-17 ENCOUNTER — Encounter (INDEPENDENT_AMBULATORY_CARE_PROVIDER_SITE_OTHER): Payer: Self-pay | Admitting: *Deleted

## 2012-08-28 ENCOUNTER — Ambulatory Visit (INDEPENDENT_AMBULATORY_CARE_PROVIDER_SITE_OTHER): Payer: Medicare Other | Admitting: *Deleted

## 2012-08-28 DIAGNOSIS — I4891 Unspecified atrial fibrillation: Secondary | ICD-10-CM | POA: Diagnosis not present

## 2012-08-28 DIAGNOSIS — Z7901 Long term (current) use of anticoagulants: Secondary | ICD-10-CM

## 2012-08-28 LAB — POCT INR: INR: 2.5

## 2012-09-25 ENCOUNTER — Ambulatory Visit (INDEPENDENT_AMBULATORY_CARE_PROVIDER_SITE_OTHER): Payer: Medicare Other | Admitting: *Deleted

## 2012-09-25 DIAGNOSIS — Z7901 Long term (current) use of anticoagulants: Secondary | ICD-10-CM | POA: Diagnosis not present

## 2012-09-25 DIAGNOSIS — I4891 Unspecified atrial fibrillation: Secondary | ICD-10-CM | POA: Diagnosis not present

## 2012-09-25 LAB — POCT INR: INR: 2.5

## 2012-10-02 DIAGNOSIS — H26499 Other secondary cataract, unspecified eye: Secondary | ICD-10-CM | POA: Diagnosis not present

## 2012-10-03 DIAGNOSIS — Z79899 Other long term (current) drug therapy: Secondary | ICD-10-CM | POA: Diagnosis not present

## 2012-10-03 DIAGNOSIS — E291 Testicular hypofunction: Secondary | ICD-10-CM | POA: Diagnosis not present

## 2012-10-03 DIAGNOSIS — R5383 Other fatigue: Secondary | ICD-10-CM | POA: Diagnosis not present

## 2012-10-03 DIAGNOSIS — E7800 Pure hypercholesterolemia, unspecified: Secondary | ICD-10-CM | POA: Diagnosis not present

## 2012-10-09 DIAGNOSIS — G4700 Insomnia, unspecified: Secondary | ICD-10-CM | POA: Diagnosis not present

## 2012-10-09 DIAGNOSIS — K589 Irritable bowel syndrome without diarrhea: Secondary | ICD-10-CM | POA: Diagnosis not present

## 2012-10-09 DIAGNOSIS — I4891 Unspecified atrial fibrillation: Secondary | ICD-10-CM | POA: Diagnosis not present

## 2012-10-09 DIAGNOSIS — E7800 Pure hypercholesterolemia, unspecified: Secondary | ICD-10-CM | POA: Diagnosis not present

## 2012-10-09 DIAGNOSIS — I10 Essential (primary) hypertension: Secondary | ICD-10-CM | POA: Diagnosis not present

## 2012-10-09 DIAGNOSIS — N400 Enlarged prostate without lower urinary tract symptoms: Secondary | ICD-10-CM | POA: Diagnosis not present

## 2012-10-26 ENCOUNTER — Ambulatory Visit (INDEPENDENT_AMBULATORY_CARE_PROVIDER_SITE_OTHER): Payer: Medicare Other | Admitting: *Deleted

## 2012-10-26 ENCOUNTER — Telehealth: Payer: Self-pay | Admitting: Cardiology

## 2012-10-26 DIAGNOSIS — I4891 Unspecified atrial fibrillation: Secondary | ICD-10-CM

## 2012-10-26 DIAGNOSIS — Z7901 Long term (current) use of anticoagulants: Secondary | ICD-10-CM

## 2012-10-26 MED ORDER — AMLODIPINE BESYLATE 5 MG PO TABS: 5.0000 mg | ORAL_TABLET | Freq: Every day | ORAL | Status: DC

## 2012-10-26 NOTE — Telephone Encounter (Signed)
[facility] CALLED TO GET AMLODOPINE CALLED IN ASAP

## 2012-11-06 ENCOUNTER — Other Ambulatory Visit: Payer: Self-pay | Admitting: Cardiology

## 2012-11-13 ENCOUNTER — Ambulatory Visit (INDEPENDENT_AMBULATORY_CARE_PROVIDER_SITE_OTHER): Payer: Medicare Other | Admitting: *Deleted

## 2012-11-13 DIAGNOSIS — I4891 Unspecified atrial fibrillation: Secondary | ICD-10-CM | POA: Diagnosis not present

## 2012-11-13 DIAGNOSIS — Z7901 Long term (current) use of anticoagulants: Secondary | ICD-10-CM | POA: Diagnosis not present

## 2012-12-04 ENCOUNTER — Ambulatory Visit (INDEPENDENT_AMBULATORY_CARE_PROVIDER_SITE_OTHER): Payer: Medicare Other | Admitting: *Deleted

## 2012-12-04 DIAGNOSIS — Z7901 Long term (current) use of anticoagulants: Secondary | ICD-10-CM

## 2012-12-04 DIAGNOSIS — I4891 Unspecified atrial fibrillation: Secondary | ICD-10-CM

## 2012-12-04 LAB — POCT INR: INR: 2.6

## 2012-12-18 ENCOUNTER — Other Ambulatory Visit: Payer: Self-pay | Admitting: Cardiology

## 2012-12-27 DIAGNOSIS — N401 Enlarged prostate with lower urinary tract symptoms: Secondary | ICD-10-CM | POA: Diagnosis not present

## 2012-12-27 DIAGNOSIS — E291 Testicular hypofunction: Secondary | ICD-10-CM | POA: Diagnosis not present

## 2013-01-01 ENCOUNTER — Ambulatory Visit (INDEPENDENT_AMBULATORY_CARE_PROVIDER_SITE_OTHER): Payer: Medicare Other | Admitting: *Deleted

## 2013-01-01 DIAGNOSIS — I4891 Unspecified atrial fibrillation: Secondary | ICD-10-CM

## 2013-01-01 DIAGNOSIS — Z7901 Long term (current) use of anticoagulants: Secondary | ICD-10-CM

## 2013-01-29 ENCOUNTER — Ambulatory Visit (INDEPENDENT_AMBULATORY_CARE_PROVIDER_SITE_OTHER): Payer: Medicare Other | Admitting: *Deleted

## 2013-01-29 DIAGNOSIS — I4891 Unspecified atrial fibrillation: Secondary | ICD-10-CM

## 2013-01-29 DIAGNOSIS — Z7901 Long term (current) use of anticoagulants: Secondary | ICD-10-CM

## 2013-01-31 ENCOUNTER — Encounter: Payer: Self-pay | Admitting: Cardiology

## 2013-01-31 ENCOUNTER — Ambulatory Visit (INDEPENDENT_AMBULATORY_CARE_PROVIDER_SITE_OTHER): Payer: Medicare Other | Admitting: Cardiology

## 2013-01-31 VITALS — BP 99/63 | HR 65 | Ht 72.0 in | Wt 185.0 lb

## 2013-01-31 DIAGNOSIS — E7800 Pure hypercholesterolemia, unspecified: Secondary | ICD-10-CM | POA: Diagnosis not present

## 2013-01-31 DIAGNOSIS — I10 Essential (primary) hypertension: Secondary | ICD-10-CM | POA: Diagnosis not present

## 2013-01-31 DIAGNOSIS — I071 Rheumatic tricuspid insufficiency: Secondary | ICD-10-CM

## 2013-01-31 DIAGNOSIS — I059 Rheumatic mitral valve disease, unspecified: Secondary | ICD-10-CM

## 2013-01-31 DIAGNOSIS — I2510 Atherosclerotic heart disease of native coronary artery without angina pectoris: Secondary | ICD-10-CM | POA: Diagnosis not present

## 2013-01-31 DIAGNOSIS — I079 Rheumatic tricuspid valve disease, unspecified: Secondary | ICD-10-CM | POA: Diagnosis not present

## 2013-01-31 DIAGNOSIS — I4891 Unspecified atrial fibrillation: Secondary | ICD-10-CM

## 2013-01-31 DIAGNOSIS — I340 Nonrheumatic mitral (valve) insufficiency: Secondary | ICD-10-CM

## 2013-01-31 DIAGNOSIS — I4821 Permanent atrial fibrillation: Secondary | ICD-10-CM

## 2013-01-31 NOTE — Patient Instructions (Signed)
   Echo Office will contact with results via phone or letter.   Continue all current medications. Your physician wants you to follow up in: 6 months.  You will receive a reminder letter in the mail one-two months in advance.  If you don't receive a letter, please call our office to schedule the follow up appointment

## 2013-01-31 NOTE — Progress Notes (Signed)
Patient ID: Benjamin Mason, male   DOB: [birth year], 79 y.o.   MRN: [id number] PCP: Dr. Neita Carp  79 yo with history of permanent atrial fibrillation and CAD returns for cardiology followup.  He has been doing well since last appointment.  No chest pain. He is short of breath walking up a hill (but typicall does not have to stop).  He walks 1/2-1 mile several times a week.   He is active and does yardwork.  No palpitations, lightheadedness, syncope.  HR is well-controlled with atenolol.  He is steady on his feet with no falls.  BP is mildly low.    Labs (2/14): K 4.6, creatinine 1.87, LDL 67, HDL 47  PMH: 1. Atrial fibrillation: Permanent, on coumadin.  2. HTN 3. CAD: PCI in 7/95, PCI in 11/95.  Myoview in 5/11 showed no ischemia or infarction.  4. BPH 5. IBS 6. Valvular heart disease: Echo (6/13) with EF 50-55%, mild LVH, mild to moderate AI, moderate MR, moderate TR, peak RV-RA gradient 26 mmHg.  7. CKD  SH: Lives in [place], married, 7 children.  Owns Education officer, environmental.  Quit smoking in 1964.   FH: No premature CAD.   ROS: All systems reviewed and negative except as per HPI.   Current Outpatient Prescriptions  Medication Sig Dispense Refill  . acetaminophen (TYLENOL ARTHRITIS PAIN) 650 MG CR tablet Take 650 mg by mouth 2 (two) times daily.       Marland Kitchen amLODipine (NORVASC) 5 MG tablet Take 1 tablet (5 mg total) by mouth daily.  90 tablet  3  . atenolol (TENORMIN) 50 MG tablet TAKE ONE TABLET ONCE DAILY  30 tablet  5  . finasteride (PROSCAR) 5 MG tablet Take 5 mg by mouth daily.        Marland Kitchen LORazepam (ATIVAN) 2 MG tablet Take 2 mg by mouth daily.        . Multiple Vitamins-Minerals (CENTRUM CARDIO PO) Take 1 tablet by mouth daily.       . simvastatin (ZOCOR) 20 MG tablet Take 10 mg by mouth at bedtime.       . Tamsulosin HCl (FLOMAX) 0.4 MG CAPS Take 0.4 mg by mouth daily.        . TESTOSTERONE IM Inject 1 mL into the muscle every 14 (fourteen) days.       Marland Kitchen warfarin (COUMADIN) 3 MG tablet  Take 3 mg by mouth as directed. MANAGED BY [facility])       No current facility-administered medications for this visit.    BP 99/63  Pulse 65  Ht 6' (1.829 m)  Wt 185 lb (83.915 kg)  BMI 25.08 kg/m2  SpO2 98% General: NAD Neck: No JVD, no thyromegaly or thyroid nodule.  Lungs: Clear to auscultation bilaterally with normal respiratory effort. CV: Nondisplaced PMI.  Heart irregular S1/S2, no S3/S4, 1/6 HSM LLSB.  No peripheral edema.  No carotid bruit.  Normal pedal pulses.  Abdomen: Soft, nontender, no hepatosplenomegaly, no distention.  Neurologic: Alert and oriented x 3.  Psych: Normal affect. Extremities: No clubbing or cyanosis.   Assessment/Plan: 1. Atrial fibrillation: Permanent.  Good rate control on atenolol.  Tolerating warfarin with no problems.  He has no history of CVA.    2. CAD: Stable with no ischemic symptoms.  Continue statin and ACEI.   3. Valvular heart disease: Mild to moderate AI, moderate MR, moderate TR on last echo.  He does not have a prominent murmur on exam.  No significant exertional dyspnea, no  lightheadedness.  Repeat echo to follow valvular lesions.  4. Hyperlipidemia: Good lipids 2/14.  5. HTN: BP actually runs at bit low.  I will have him decrease amlodipine to 2.5 mg daily.  6. CKD: Check BMET.   Benjamin Mason 01/31/2013 11:41 PM

## 2013-02-07 ENCOUNTER — Other Ambulatory Visit: Payer: Medicare Other

## 2013-02-07 ENCOUNTER — Other Ambulatory Visit (INDEPENDENT_AMBULATORY_CARE_PROVIDER_SITE_OTHER): Payer: Medicare Other

## 2013-02-07 ENCOUNTER — Other Ambulatory Visit: Payer: Self-pay

## 2013-02-07 DIAGNOSIS — N400 Enlarged prostate without lower urinary tract symptoms: Secondary | ICD-10-CM | POA: Diagnosis not present

## 2013-02-07 DIAGNOSIS — N189 Chronic kidney disease, unspecified: Secondary | ICD-10-CM | POA: Diagnosis not present

## 2013-02-07 DIAGNOSIS — I079 Rheumatic tricuspid valve disease, unspecified: Secondary | ICD-10-CM | POA: Diagnosis not present

## 2013-02-07 DIAGNOSIS — I4891 Unspecified atrial fibrillation: Secondary | ICD-10-CM | POA: Diagnosis not present

## 2013-02-07 DIAGNOSIS — I071 Rheumatic tricuspid insufficiency: Secondary | ICD-10-CM

## 2013-02-07 DIAGNOSIS — I059 Rheumatic mitral valve disease, unspecified: Secondary | ICD-10-CM

## 2013-02-07 DIAGNOSIS — I340 Nonrheumatic mitral (valve) insufficiency: Secondary | ICD-10-CM

## 2013-02-07 DIAGNOSIS — K589 Irritable bowel syndrome without diarrhea: Secondary | ICD-10-CM | POA: Diagnosis not present

## 2013-02-07 DIAGNOSIS — G4700 Insomnia, unspecified: Secondary | ICD-10-CM | POA: Diagnosis not present

## 2013-02-07 DIAGNOSIS — E7800 Pure hypercholesterolemia, unspecified: Secondary | ICD-10-CM | POA: Diagnosis not present

## 2013-02-07 DIAGNOSIS — I10 Essential (primary) hypertension: Secondary | ICD-10-CM | POA: Diagnosis not present

## 2013-02-14 ENCOUNTER — Encounter: Payer: Self-pay | Admitting: *Deleted

## 2013-02-26 ENCOUNTER — Ambulatory Visit (INDEPENDENT_AMBULATORY_CARE_PROVIDER_SITE_OTHER): Payer: Medicare Other | Admitting: *Deleted

## 2013-02-26 DIAGNOSIS — I4891 Unspecified atrial fibrillation: Secondary | ICD-10-CM

## 2013-02-26 DIAGNOSIS — Z7901 Long term (current) use of anticoagulants: Secondary | ICD-10-CM | POA: Diagnosis not present

## 2013-02-26 LAB — POCT INR: INR: 1.8

## 2013-03-19 ENCOUNTER — Ambulatory Visit (INDEPENDENT_AMBULATORY_CARE_PROVIDER_SITE_OTHER): Payer: Medicare Other | Admitting: *Deleted

## 2013-03-19 DIAGNOSIS — I4891 Unspecified atrial fibrillation: Secondary | ICD-10-CM | POA: Diagnosis not present

## 2013-03-19 DIAGNOSIS — Z7901 Long term (current) use of anticoagulants: Secondary | ICD-10-CM

## 2013-03-19 LAB — POCT INR: INR: 2.5

## 2013-04-16 ENCOUNTER — Ambulatory Visit (INDEPENDENT_AMBULATORY_CARE_PROVIDER_SITE_OTHER): Payer: Medicare Other | Admitting: *Deleted

## 2013-04-16 DIAGNOSIS — I4891 Unspecified atrial fibrillation: Secondary | ICD-10-CM

## 2013-04-16 DIAGNOSIS — Z7901 Long term (current) use of anticoagulants: Secondary | ICD-10-CM

## 2013-04-16 LAB — POCT INR: INR: 1.8

## 2013-05-01 ENCOUNTER — Other Ambulatory Visit: Payer: Self-pay | Admitting: Cardiology

## 2013-05-07 ENCOUNTER — Ambulatory Visit (INDEPENDENT_AMBULATORY_CARE_PROVIDER_SITE_OTHER): Payer: Medicare Other | Admitting: *Deleted

## 2013-05-07 DIAGNOSIS — I4891 Unspecified atrial fibrillation: Secondary | ICD-10-CM | POA: Diagnosis not present

## 2013-05-07 DIAGNOSIS — Z7901 Long term (current) use of anticoagulants: Secondary | ICD-10-CM | POA: Diagnosis not present

## 2013-05-23 DIAGNOSIS — C44621 Squamous cell carcinoma of skin of unspecified upper limb, including shoulder: Secondary | ICD-10-CM | POA: Diagnosis not present

## 2013-05-23 DIAGNOSIS — D485 Neoplasm of uncertain behavior of skin: Secondary | ICD-10-CM | POA: Diagnosis not present

## 2013-06-04 ENCOUNTER — Ambulatory Visit (INDEPENDENT_AMBULATORY_CARE_PROVIDER_SITE_OTHER): Payer: Medicare Other | Admitting: *Deleted

## 2013-06-04 DIAGNOSIS — Z7901 Long term (current) use of anticoagulants: Secondary | ICD-10-CM

## 2013-06-04 DIAGNOSIS — I4891 Unspecified atrial fibrillation: Secondary | ICD-10-CM | POA: Diagnosis not present

## 2013-06-04 LAB — POCT INR: INR: 2.1

## 2013-06-06 DIAGNOSIS — R7309 Other abnormal glucose: Secondary | ICD-10-CM | POA: Diagnosis not present

## 2013-06-06 DIAGNOSIS — E7800 Pure hypercholesterolemia, unspecified: Secondary | ICD-10-CM | POA: Diagnosis not present

## 2013-06-06 DIAGNOSIS — I10 Essential (primary) hypertension: Secondary | ICD-10-CM | POA: Diagnosis not present

## 2013-06-19 DIAGNOSIS — I10 Essential (primary) hypertension: Secondary | ICD-10-CM | POA: Diagnosis not present

## 2013-06-19 DIAGNOSIS — N189 Chronic kidney disease, unspecified: Secondary | ICD-10-CM | POA: Diagnosis not present

## 2013-06-19 DIAGNOSIS — I4891 Unspecified atrial fibrillation: Secondary | ICD-10-CM | POA: Diagnosis not present

## 2013-06-19 DIAGNOSIS — Z23 Encounter for immunization: Secondary | ICD-10-CM | POA: Diagnosis not present

## 2013-06-19 DIAGNOSIS — K589 Irritable bowel syndrome without diarrhea: Secondary | ICD-10-CM | POA: Diagnosis not present

## 2013-06-19 DIAGNOSIS — N400 Enlarged prostate without lower urinary tract symptoms: Secondary | ICD-10-CM | POA: Diagnosis not present

## 2013-06-19 DIAGNOSIS — E7800 Pure hypercholesterolemia, unspecified: Secondary | ICD-10-CM | POA: Diagnosis not present

## 2013-06-19 DIAGNOSIS — G4700 Insomnia, unspecified: Secondary | ICD-10-CM | POA: Diagnosis not present

## 2013-06-21 DIAGNOSIS — J40 Bronchitis, not specified as acute or chronic: Secondary | ICD-10-CM | POA: Diagnosis not present

## 2013-06-24 DIAGNOSIS — I4891 Unspecified atrial fibrillation: Secondary | ICD-10-CM | POA: Diagnosis not present

## 2013-06-27 DIAGNOSIS — J209 Acute bronchitis, unspecified: Secondary | ICD-10-CM | POA: Diagnosis not present

## 2013-07-09 ENCOUNTER — Ambulatory Visit (INDEPENDENT_AMBULATORY_CARE_PROVIDER_SITE_OTHER): Payer: Medicare Other | Admitting: *Deleted

## 2013-07-09 DIAGNOSIS — I4891 Unspecified atrial fibrillation: Secondary | ICD-10-CM

## 2013-07-09 DIAGNOSIS — Z7901 Long term (current) use of anticoagulants: Secondary | ICD-10-CM | POA: Diagnosis not present

## 2013-07-09 LAB — POCT INR: INR: 1.9

## 2013-07-15 DIAGNOSIS — K625 Hemorrhage of anus and rectum: Secondary | ICD-10-CM | POA: Diagnosis not present

## 2013-07-15 DIAGNOSIS — R1032 Left lower quadrant pain: Secondary | ICD-10-CM | POA: Diagnosis not present

## 2013-07-15 DIAGNOSIS — K5732 Diverticulitis of large intestine without perforation or abscess without bleeding: Secondary | ICD-10-CM | POA: Diagnosis not present

## 2013-07-15 DIAGNOSIS — K589 Irritable bowel syndrome without diarrhea: Secondary | ICD-10-CM | POA: Diagnosis not present

## 2013-07-31 ENCOUNTER — Ambulatory Visit (INDEPENDENT_AMBULATORY_CARE_PROVIDER_SITE_OTHER): Payer: Medicare Other | Admitting: Cardiology

## 2013-07-31 ENCOUNTER — Encounter: Payer: Self-pay | Admitting: Cardiology

## 2013-07-31 VITALS — BP 117/70 | HR 60 | Ht 72.0 in | Wt 185.0 lb

## 2013-07-31 DIAGNOSIS — I2510 Atherosclerotic heart disease of native coronary artery without angina pectoris: Secondary | ICD-10-CM

## 2013-07-31 DIAGNOSIS — E782 Mixed hyperlipidemia: Secondary | ICD-10-CM

## 2013-07-31 DIAGNOSIS — I38 Endocarditis, valve unspecified: Secondary | ICD-10-CM | POA: Insufficient documentation

## 2013-07-31 DIAGNOSIS — I10 Essential (primary) hypertension: Secondary | ICD-10-CM

## 2013-07-31 DIAGNOSIS — I4891 Unspecified atrial fibrillation: Secondary | ICD-10-CM

## 2013-07-31 DIAGNOSIS — I4821 Permanent atrial fibrillation: Secondary | ICD-10-CM

## 2013-07-31 NOTE — Assessment & Plan Note (Signed)
He continues on low-dose Zocor, good lipid control with LDL outlined above.

## 2013-07-31 NOTE — Assessment & Plan Note (Signed)
No active angina symptoms. History outlined above. No ischemia by Myoview in 2011. Continue observation on medical therapy. Followup arranged in 6 months.

## 2013-07-31 NOTE — Progress Notes (Signed)
Clinical Summary Mr. Bogdon is an 79 y.o.male last seen in the office back in June by Dr. Shirlee Latch. He is a former patient of Dr. Andee Lineman. This is our first meeting in the office. I reviewed his history. He has been doing well, denies any palpitations or progressive breathlessness. States that he has been doing well with his typical activities. Plays golf 3 times a week when he can. 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  Echocardiogram from June of this year showed mild LVH with LVEF 55-60%, mild to moderate aortic regurgitation, mild to moderate mitral regurgitation, moderate left atrial enlargement, moderate tricuspid regurgitation with PASP 43 mm mercury.  Lab work from June of this year showed cholesterol 140, triglycerides 80, HDL 52, LDL 72. He reports compliance to his medications, no major side effects.  No bleeding problems on Coumadin.   No Known Allergies  Current Outpatient Prescriptions  Medication Sig Dispense Refill  . acetaminophen (TYLENOL ARTHRITIS PAIN) 650 MG CR tablet Take 650 mg by mouth 2 (two) times daily.       Marland Kitchen amLODipine (NORVASC) 5 MG tablet Take 2.5 mg by mouth daily.      Marland Kitchen atenolol (TENORMIN) 50 MG tablet TAKE ONE TABLET ONCE DAILY  30 tablet  5  . finasteride (PROSCAR) 5 MG tablet Take 5 mg by mouth daily.        Marland Kitchen LORazepam (ATIVAN) 2 MG tablet Take 1.5 mg by mouth daily.       . Multiple Vitamins-Minerals (CENTRUM CARDIO PO) Take 1 tablet by mouth daily.       . simvastatin (ZOCOR) 20 MG tablet Take 10 mg by mouth at bedtime.       . Tamsulosin HCl (FLOMAX) 0.4 MG CAPS Take 0.4 mg by mouth daily.        . TESTOSTERONE IM Inject 1 mL into the muscle every 14 (fourteen) days.       Marland Kitchen warfarin (COUMADIN) 3 MG tablet Take 3 mg by mouth as directed. MANAGED BY [facility])      . warfarin (COUMADIN) 3 MG tablet TAKE ONE TABLET BY MOUTH AS DIRECTED  45 tablet  6   No current facility-administered medications for this visit.    Past Medical  History  Diagnosis Date  . Permanent atrial fibrillation   . Mixed hyperlipidemia   . Anxiety   . Coronary atherosclerosis of native coronary artery     BMS to RCA and LAD 1995  . Essential hypertension, benign   . IBS (irritable bowel syndrome)   . Mitral regurgitation     Moderate  . Aortic regurgitation     Mild to moderate    Social History Mr. Rosevear reports that he quit smoking about 50 years ago. His smoking use included Cigarettes. He has a 50 pack-year smoking history. He has never used smokeless tobacco. Mr. Asche reports that he does not drink alcohol.  Review of Systems No claudication, chronic ankle edema. No orthopnea or PND. Stable appetite. Otherwise negative.  Physical Examination Filed Vitals:   07/31/13 1441  BP: 117/70  Pulse: 60   Filed Weights   07/31/13 1441  Weight: 185 lb (83.915 kg)   Patient appears comfortable at rest. HEENT: Conjunctiva and lids normal, oropharynx clear. Neck: Supple, no elevated JVP or carotid bruits, no thyromegaly. Lungs: Clear to auscultation, nonlabored breathing at rest. Cardiac: Irregularly irregular, no S3, 2/6 apical systolic murmur, no pericardial rub. Abdomen: Soft, nontender, bowel sounds present, no guarding  or rebound. Extremities: 1+ edema, distal pulses 2+. Skin: Warm and dry. Musculoskeletal: No kyphosis. Neuropsychiatric: Alert and oriented x3, affect grossly appropriate.   Problem List and Plan   Coronary atherosclerosis of native coronary artery No active angina symptoms. History outlined above. No ischemia by Myoview in 2011. Continue observation on medical therapy. Followup arranged in 6 months.  Essential hypertension, benign Blood pressure is well-controlled today.  Mixed hyperlipidemia He continues on low-dose Zocor, good lipid control with LDL outlined above.  Permanent atrial fibrillation Asymptomatic, continue strategy of heart rate control and anticoagulation.  Valvular heart  disease Stable by followup echocardiogram in June of this year.    Jonelle Sidle, M.D., F.A.C.C.

## 2013-07-31 NOTE — Assessment & Plan Note (Signed)
Stable by followup echocardiogram in June of this year.

## 2013-07-31 NOTE — Assessment & Plan Note (Signed)
Blood pressure is well-controlled today. 

## 2013-07-31 NOTE — Patient Instructions (Signed)
Continue all current medications. Your physician wants you to follow up in: 6 months.  You will receive a reminder letter in the mail one-two months in advance.  If you don't receive a letter, please call our office to schedule the follow up appointment   

## 2013-07-31 NOTE — Assessment & Plan Note (Signed)
Asymptomatic, continue strategy of heart rate control and anticoagulation.

## 2013-08-13 ENCOUNTER — Ambulatory Visit (INDEPENDENT_AMBULATORY_CARE_PROVIDER_SITE_OTHER): Payer: Medicare Other | Admitting: *Deleted

## 2013-08-13 DIAGNOSIS — I4891 Unspecified atrial fibrillation: Secondary | ICD-10-CM | POA: Diagnosis not present

## 2013-08-13 DIAGNOSIS — Z7901 Long term (current) use of anticoagulants: Secondary | ICD-10-CM | POA: Diagnosis not present

## 2013-09-10 ENCOUNTER — Ambulatory Visit (INDEPENDENT_AMBULATORY_CARE_PROVIDER_SITE_OTHER): Payer: Medicare Other | Admitting: *Deleted

## 2013-09-10 ENCOUNTER — Encounter (INDEPENDENT_AMBULATORY_CARE_PROVIDER_SITE_OTHER): Payer: Self-pay | Admitting: *Deleted

## 2013-09-10 DIAGNOSIS — Z5181 Encounter for therapeutic drug level monitoring: Secondary | ICD-10-CM | POA: Diagnosis not present

## 2013-09-10 DIAGNOSIS — Z7901 Long term (current) use of anticoagulants: Secondary | ICD-10-CM | POA: Diagnosis not present

## 2013-09-10 DIAGNOSIS — I4821 Permanent atrial fibrillation: Secondary | ICD-10-CM

## 2013-09-10 DIAGNOSIS — I4891 Unspecified atrial fibrillation: Secondary | ICD-10-CM | POA: Diagnosis not present

## 2013-09-10 LAB — POCT INR: INR: 1.9

## 2013-09-17 ENCOUNTER — Other Ambulatory Visit: Payer: Self-pay | Admitting: Cardiology

## 2013-09-23 ENCOUNTER — Ambulatory Visit (INDEPENDENT_AMBULATORY_CARE_PROVIDER_SITE_OTHER): Payer: Medicare Other | Admitting: Internal Medicine

## 2013-10-11 DIAGNOSIS — I10 Essential (primary) hypertension: Secondary | ICD-10-CM | POA: Diagnosis not present

## 2013-10-11 DIAGNOSIS — E7800 Pure hypercholesterolemia, unspecified: Secondary | ICD-10-CM | POA: Diagnosis not present

## 2013-10-15 ENCOUNTER — Ambulatory Visit (INDEPENDENT_AMBULATORY_CARE_PROVIDER_SITE_OTHER): Payer: Medicare Other | Admitting: *Deleted

## 2013-10-15 DIAGNOSIS — Z5181 Encounter for therapeutic drug level monitoring: Secondary | ICD-10-CM | POA: Diagnosis not present

## 2013-10-15 DIAGNOSIS — Z7901 Long term (current) use of anticoagulants: Secondary | ICD-10-CM

## 2013-10-15 DIAGNOSIS — I4821 Permanent atrial fibrillation: Secondary | ICD-10-CM

## 2013-10-15 DIAGNOSIS — I4891 Unspecified atrial fibrillation: Secondary | ICD-10-CM

## 2013-10-15 LAB — POCT INR: INR: 2.7

## 2013-10-17 DIAGNOSIS — N400 Enlarged prostate without lower urinary tract symptoms: Secondary | ICD-10-CM | POA: Diagnosis not present

## 2013-10-17 DIAGNOSIS — G4700 Insomnia, unspecified: Secondary | ICD-10-CM | POA: Diagnosis not present

## 2013-10-17 DIAGNOSIS — R413 Other amnesia: Secondary | ICD-10-CM | POA: Diagnosis not present

## 2013-10-17 DIAGNOSIS — K589 Irritable bowel syndrome without diarrhea: Secondary | ICD-10-CM | POA: Diagnosis not present

## 2013-10-17 DIAGNOSIS — N189 Chronic kidney disease, unspecified: Secondary | ICD-10-CM | POA: Diagnosis not present

## 2013-10-17 DIAGNOSIS — I4891 Unspecified atrial fibrillation: Secondary | ICD-10-CM | POA: Diagnosis not present

## 2013-10-17 DIAGNOSIS — E7800 Pure hypercholesterolemia, unspecified: Secondary | ICD-10-CM | POA: Diagnosis not present

## 2013-10-17 DIAGNOSIS — I10 Essential (primary) hypertension: Secondary | ICD-10-CM | POA: Diagnosis not present

## 2013-11-05 DIAGNOSIS — H43399 Other vitreous opacities, unspecified eye: Secondary | ICD-10-CM | POA: Diagnosis not present

## 2013-11-05 DIAGNOSIS — H26499 Other secondary cataract, unspecified eye: Secondary | ICD-10-CM | POA: Diagnosis not present

## 2013-11-12 ENCOUNTER — Ambulatory Visit (INDEPENDENT_AMBULATORY_CARE_PROVIDER_SITE_OTHER): Payer: Medicare Other | Admitting: *Deleted

## 2013-11-12 DIAGNOSIS — Z5181 Encounter for therapeutic drug level monitoring: Secondary | ICD-10-CM

## 2013-11-12 DIAGNOSIS — I4891 Unspecified atrial fibrillation: Secondary | ICD-10-CM

## 2013-11-12 DIAGNOSIS — I4821 Permanent atrial fibrillation: Secondary | ICD-10-CM

## 2013-11-12 DIAGNOSIS — Z7901 Long term (current) use of anticoagulants: Secondary | ICD-10-CM | POA: Diagnosis not present

## 2013-11-12 LAB — POCT INR: INR: 1.8

## 2013-11-18 ENCOUNTER — Encounter: Payer: Self-pay | Admitting: Cardiology

## 2013-11-18 ENCOUNTER — Ambulatory Visit (INDEPENDENT_AMBULATORY_CARE_PROVIDER_SITE_OTHER): Payer: Medicare Other | Admitting: Cardiology

## 2013-11-18 ENCOUNTER — Telehealth: Payer: Self-pay | Admitting: Cardiology

## 2013-11-18 ENCOUNTER — Encounter: Payer: Self-pay | Admitting: *Deleted

## 2013-11-18 VITALS — BP 132/75 | HR 59 | Ht 72.0 in | Wt 184.0 lb

## 2013-11-18 DIAGNOSIS — I4891 Unspecified atrial fibrillation: Secondary | ICD-10-CM | POA: Diagnosis not present

## 2013-11-18 DIAGNOSIS — I10 Essential (primary) hypertension: Secondary | ICD-10-CM | POA: Diagnosis not present

## 2013-11-18 DIAGNOSIS — I4821 Permanent atrial fibrillation: Secondary | ICD-10-CM

## 2013-11-18 DIAGNOSIS — I2510 Atherosclerotic heart disease of native coronary artery without angina pectoris: Secondary | ICD-10-CM | POA: Diagnosis not present

## 2013-11-18 NOTE — Assessment & Plan Note (Signed)
No obvious issues with persistent rapid rates. Continue current regimen.

## 2013-11-18 NOTE — Assessment & Plan Note (Signed)
Continue current regimen, agree with restarting Norvasc at low dose.

## 2013-11-18 NOTE — Patient Instructions (Signed)
Your physician has requested that you have a lexiscan myoview. For further information please visit [email or web address]. Please follow instruction sheet, as given. Office will contact with results via phone or letter.  Continue all current medications.  Follow up will be based on test results from above.

## 2013-11-18 NOTE — Assessment & Plan Note (Signed)
He has reported some angina symptoms, none since being back on Norvasc, although he has cut back somewhat on his walking. We will proceed with a Lexiscan Cardiolite on medical therapy to assess ischemic burden and help guide further management. Has history of BMS to the RCA and LAD in 1995. We will plan to call him with the results.

## 2013-11-18 NOTE — Progress Notes (Signed)
Clinical Summary  [name] is an 80 y.o.male last seen in December 2014. In general he has been doing fairly well, although does describe some exertional chest pain symptoms consistent with angina within the last several weeks. He has been walking with Benjamin Mason regularly in the morning since January, and has "picked up the pace" somewhat. He was temporarily off Norvasc during some of the time that he was experiencing the symptoms. He has not had any recent followup ischemic testing.  Echocardiogram from June 2014 showed mild LVH with LVEF 55-60%, mild to moderate aortic regurgitation, mild to moderate mitral regurgitation, moderate left atrial enlargement, moderate tricuspid regurgitation with PASP 43 mm mercury.   No Known Allergies  Current Outpatient Prescriptions  Medication Sig Dispense Refill  . acetaminophen (TYLENOL ARTHRITIS PAIN) 650 MG CR tablet Take 1,300 mg by mouth 2 (two) times daily.       Marland Kitchen amLODipine (NORVASC) 5 MG tablet Take 2.5 mg by mouth daily.      Marland Kitchen atenolol (TENORMIN) 50 MG tablet TAKE ONE TABLET ONCE DAILY  90 tablet  6  . finasteride (PROSCAR) 5 MG tablet Take 5 mg by mouth daily.        Marland Kitchen LORazepam (ATIVAN) 2 MG tablet Take 1 mg by mouth daily.       . Multiple Vitamins-Minerals (CENTRUM CARDIO PO) Take 1 tablet by mouth daily.       . simvastatin (ZOCOR) 20 MG tablet Take 10 mg by mouth at bedtime.       . Tamsulosin HCl (FLOMAX) 0.4 MG CAPS Take 0.4 mg by mouth daily.        . TESTOSTERONE IM Inject 1 mL into the muscle every 14 (fourteen) days.       Marland Kitchen warfarin (COUMADIN) 3 MG tablet Take 3 mg by mouth as directed. MANAGED BY Benjamin Mason [facility])      . warfarin (COUMADIN) 3 MG tablet TAKE ONE TABLET BY MOUTH AS DIRECTED  45 tablet  6   No current facility-administered medications for this visit.    Past Medical History  Diagnosis Date  . Permanent atrial fibrillation   . Mixed hyperlipidemia   . Anxiety   . Coronary atherosclerosis of  native coronary artery     BMS to RCA and LAD 1995  . Essential hypertension, benign   . IBS (irritable bowel syndrome)   . Mitral regurgitation     Moderate  . Aortic regurgitation     Mild to moderate    Social History Benjamin Mason reports that he quit smoking about 51 years ago. Benjamin smoking use included Cigarettes. He has a 50 pack-year smoking history. He has never used smokeless tobacco. Benjamin Mason reports that he does not drink alcohol.  Review of Systems No progressive palpitations, dizziness, syncope. No bleeding problems.  Physical Examination Filed Vitals:   11/18/13 1557  BP: 132/75  Pulse: 59   Filed Weights   11/18/13 1557  Weight: 184 lb (83.462 kg)    Patient appears comfortable at rest.  HEENT: Conjunctiva and lids normal, oropharynx clear.  Neck: Supple, no elevated JVP or carotid bruits, no thyromegaly.  Lungs: Clear to auscultation, nonlabored breathing at rest.  Cardiac: Irregularly irregular, no S3, 2/6 apical systolic murmur, no pericardial rub.  Abdomen: Soft, nontender, bowel sounds present, no guarding or rebound.  Extremities: 1+ edema, distal pulses 2+.  Skin: Warm and dry.  Musculoskeletal: No kyphosis.  Neuropsychiatric: Alert and oriented x3, affect grossly appropriate.  Problem List and Plan   Coronary atherosclerosis of native coronary artery He has reported some angina symptoms, none since being back on Norvasc, although he has cut back somewhat on Benjamin walking. We will proceed with a Lexiscan Cardiolite on medical therapy to assess ischemic burden and help guide further management. Has history of BMS to the RCA and LAD in 1995. We will plan to call him with the results.  Essential hypertension, benign Continue current regimen, agree with restarting Norvasc at low dose.  Permanent atrial fibrillation No obvious issues with persistent rapid rates. Continue current regimen.    Satira Sark, M.D., F.A.C.C.

## 2013-11-18 NOTE — Telephone Encounter (Signed)
lexiscan scheduled for 11-25-13 at [facility]

## 2013-11-19 NOTE — Telephone Encounter (Signed)
No precert required.  Medicare only 

## 2013-11-25 ENCOUNTER — Encounter (HOSPITAL_COMMUNITY)
Admission: RE | Admit: 2013-11-25 | Discharge: 2013-11-25 | Disposition: A | Discharge status: Home / Self Care | Payer: Medicare Other | Source: Ambulatory Visit | Attending: Cardiology | Admitting: Cardiology

## 2013-11-25 ENCOUNTER — Encounter (HOSPITAL_COMMUNITY): Payer: Self-pay

## 2013-11-25 DIAGNOSIS — I2510 Atherosclerotic heart disease of native coronary artery without angina pectoris: Secondary | ICD-10-CM

## 2013-11-25 MED ORDER — TECHNETIUM TC 99M SESTAMIBI - CARDIOLITE
30.0000 | Freq: Once | INTRAVENOUS | Status: AC | PRN
  Administered 2013-11-25: 30 via INTRAVENOUS

## 2013-11-25 MED ORDER — TECHNETIUM TC 99M SESTAMIBI GENERIC - CARDIOLITE
10.0000 | Freq: Once | INTRAVENOUS | Status: AC | PRN
  Administered 2013-11-25: 10 via INTRAVENOUS

## 2013-11-25 MED ORDER — SODIUM CHLORIDE 0.9 % IJ SOLN
INTRAMUSCULAR | Status: AC
  Administered 2013-11-25: 10 mL via INTRAVENOUS
  Filled 2013-11-25: qty 10

## 2013-11-25 MED ORDER — REGADENOSON 0.4 MG/5ML IV SOLN
INTRAVENOUS | Status: AC
  Administered 2013-11-25: 0.4 mg via INTRAVENOUS
  Filled 2013-11-25: qty 5

## 2013-11-25 NOTE — Progress Notes (Signed)
Stress Lab Nurses Notes - [name] 11/25/2013 Reason for doing test: CAD, Chest Pain and Dyspnea Type of test: Wille Glaser Nurse performing test: Gerrit Halls, RN Nuclear Medicine Tech: Melburn Hake Echo Tech: Not Applicable MD performing test: S. McDowell/K.Lawrence NP Family MD: Sasser Test explained and consent signed: yes IV started: 22g jelco, Saline lock flushed, No redness or edema and Saline lock started in radiology Symptoms: Chest pressure mild Treatment/Intervention: None Reason test stopped: protocol completed After recovery IV was: Discontinued via X-ray tech and No redness or edema Patient to return to [place]. Med at : 12:10 Patient discharged: Home Patient's Condition upon discharge was: stable Comments: During test BP 120/62 & HR 85.  Recovery BP 148/68 & HR 71.  Symptoms resolved in recovery. Benjamin Mason

## 2013-11-26 ENCOUNTER — Telehealth: Payer: Self-pay | Admitting: Cardiology

## 2013-11-26 NOTE — Telephone Encounter (Signed)
Message copied by Benjamin Mason on Tue Nov 26, 2013  4:05 PM ------      Message from: Mason, Benjamin Gell      Created: Mon Nov 25, 2013  2:24 PM       Reviewed. Please let him know that the study suggests possible restenosis (or new stenosis) in RCA distribution, but overall mild degree of ischemia and low risk test. If he is not having recurring exertional CP on medical therapy, we can follow conservatively for now. If symptoms are recurring despite medical therapy, then I can see him back to discuss repeat heart catheterization. I can potentially see him later this week in [place] (afternoon slot) if we need to discuss catheterization. ------

## 2013-11-26 NOTE — Telephone Encounter (Signed)
Pt informed of results and verbalized understanding. Pt stated that the exertional chest pain is better and he hasn't had any episodes since office visit with MD. Pt understands that if exertional chest pain reoccurs or get worse to call office to schedule an appointment with MD to discuss repeat heart catheterization.

## 2013-11-26 NOTE — Telephone Encounter (Signed)
Pharmacy calling request prescription for nitro 0.4 SL tablet. Is this ok?

## 2013-11-27 MED ORDER — NITROGLYCERIN 0.4 MG SL SUBL: 0.4000 mg | SUBLINGUAL_TABLET | SUBLINGUAL | Status: DC | PRN

## 2013-11-27 NOTE — Telephone Encounter (Signed)
Prescription sent to pharmacy for Nitro 0.4 MG SL tablets.

## 2013-11-27 NOTE — Telephone Encounter (Signed)
Yes

## 2013-12-02 DIAGNOSIS — M999 Biomechanical lesion, unspecified: Secondary | ICD-10-CM | POA: Diagnosis not present

## 2013-12-02 DIAGNOSIS — M5430 Sciatica, unspecified side: Secondary | ICD-10-CM | POA: Diagnosis not present

## 2013-12-04 ENCOUNTER — Ambulatory Visit (INDEPENDENT_AMBULATORY_CARE_PROVIDER_SITE_OTHER): Payer: Medicare Other | Admitting: Internal Medicine

## 2013-12-04 ENCOUNTER — Encounter (INDEPENDENT_AMBULATORY_CARE_PROVIDER_SITE_OTHER): Payer: Self-pay | Admitting: Internal Medicine

## 2013-12-04 VITALS — BP 126/52 | HR 60 | Temp 97.7°F | Ht 72.0 in | Wt 188.3 lb

## 2013-12-04 DIAGNOSIS — M999 Biomechanical lesion, unspecified: Secondary | ICD-10-CM | POA: Diagnosis not present

## 2013-12-04 DIAGNOSIS — R1310 Dysphagia, unspecified: Secondary | ICD-10-CM | POA: Diagnosis not present

## 2013-12-04 DIAGNOSIS — K219 Gastro-esophageal reflux disease without esophagitis: Secondary | ICD-10-CM

## 2013-12-04 DIAGNOSIS — M5430 Sciatica, unspecified side: Secondary | ICD-10-CM | POA: Diagnosis not present

## 2013-12-04 DIAGNOSIS — I2510 Atherosclerotic heart disease of native coronary artery without angina pectoris: Secondary | ICD-10-CM

## 2013-12-04 NOTE — Patient Instructions (Signed)
OV in 1 yr. 

## 2013-12-04 NOTE — Progress Notes (Signed)
Subjective:     Patient ID: Benjamin Mason, male   DOB: 02-14-1931, 80 y.o.   MRN: [id number]  HPI Here today for f/u.  He tells me he is doing good. He has acid reflux and self-treats himself with Maalox prn.  His appetite is good. No weight loss. No abdominal pain. He occasionally has dysphagia if he does not chew his food well. He usually has a BM 3-4 a day. No melena or bright red rectal bleeding. He thinks he is lactulose intolerant last year after eating snow cream. He uses Lactaid tablets and drinks Lactaid milk.   08/03/2012 EGD/ED/Colonoscopy:  Cecal Withdrawal Time: 15 minutes  Impression:  Single diverticula noted at distal esophagus(on second look post dilation).  Esophagus dilated by passing 54 French Maloney dilator but no mucosal disruption induced.  Small sliding hiatal hernia.  Colonoscopy compared to cecum with removal of small polyp from splenic flexure. Another polyp in the segment was not found on the way out.  Multiple diverticula at sigmoid and descending colon.  Review of Systems Past Medical History  Diagnosis Date  . Permanent atrial fibrillation   . Mixed hyperlipidemia   . Anxiety   . Coronary atherosclerosis of native coronary artery     BMS to RCA and LAD 1995  . Essential hypertension, benign   . IBS (irritable bowel syndrome)   . Mitral regurgitation     Moderate  . Aortic regurgitation     Mild to moderate    Past Surgical History  Procedure Laterality Date  . Left inguinal hernia    . Penile prosthesis implant    . Colonoscopy with esophagogastroduodenoscopy (egd)  08/03/2012    Procedure: COLONOSCOPY WITH ESOPHAGOGASTRODUODENOSCOPY (EGD);  Surgeon: Rogene Houston, MD;  Location: [facility];  Service: Endoscopy;  Laterality: N/A;  215  . Balloon dilation  08/03/2012    Procedure: BALLOON DILATION;  Surgeon: Rogene Houston, MD;  Location: [facility];  Service: Endoscopy;  Laterality: N/A;  Venia Minks dilation  08/03/2012   Procedure: Venia Minks DILATION;  Surgeon: Rogene Houston, MD;  Location: [facility];  Service: Endoscopy;  Laterality: N/A;  . Savory dilation  08/03/2012    Procedure: SAVORY DILATION;  Surgeon: Rogene Houston, MD;  Location: [facility];  Service: Endoscopy;  Laterality: N/A;    No Known Allergies  Current Outpatient Prescriptions on File Prior to Visit  Medication Sig Dispense Refill  . acetaminophen (TYLENOL ARTHRITIS PAIN) 650 MG CR tablet Take 1,300 mg by mouth 2 (two) times daily.       Marland Kitchen amLODipine (NORVASC) 5 MG tablet Take 2.5 mg by mouth daily.      Marland Kitchen atenolol (TENORMIN) 50 MG tablet TAKE ONE TABLET ONCE DAILY  90 tablet  6  . finasteride (PROSCAR) 5 MG tablet Take 5 mg by mouth daily.        Marland Kitchen LORazepam (ATIVAN) 2 MG tablet Take 1 mg by mouth daily.       . Multiple Vitamins-Minerals (CENTRUM CARDIO PO) Take 1 tablet by mouth daily.       . nitroGLYCERIN (NITROSTAT) 0.4 MG SL tablet Place 1 tablet (0.4 mg total) under the tongue every 5 (five) minutes as needed for chest pain.  25 tablet  3  . simvastatin (ZOCOR) 20 MG tablet Take 10 mg by mouth at bedtime.       . Tamsulosin HCl (FLOMAX) 0.4 MG CAPS Take 0.4 mg by mouth daily.        Marland Kitchen  TESTOSTERONE IM Inject 1 mL into the muscle every 14 (fourteen) days.       Marland Kitchen warfarin (COUMADIN) 3 MG tablet Take 3 mg by mouth as directed. MANAGED BY [facility])      . warfarin (COUMADIN) 3 MG tablet TAKE ONE TABLET BY MOUTH AS DIRECTED  45 tablet  6   No current facility-administered medications on file prior to visit.  Retired Administrator, sports. Married. Seven children. One deceased from ovarian cancer.      Objective:   Physical Exam  Filed Vitals:   12/04/13 1520  BP: 126/52  Pulse: 60  Temp: 97.7 F (36.5 C)  Height: 6' (1.829 m)  Weight: 188 lb 4.8 oz (85.412 kg)   Alert and oriented. Skin warm and dry. Oral mucosa is moist.   . Sclera anicteric, conjunctivae is pink. Thyroid not enlarged. No cervical lymphadenopathy. Lungs  clear. Heart regular rate and rhythm.  Abdomen is soft. Bowel sounds are positive. No hepatomegaly. No abdominal masses felt. No tenderness.  No edema to lower extremities.  .     Assessment:   GERD controlled with Maalox prn. Rare dysphagia. He seems to be doing well.  Will occasionally have dysphagia if he does not chew well.    Plan:    OV in 1 yr. Continue present medications. If any increasing problems with dysphagia, call our office.

## 2013-12-10 ENCOUNTER — Ambulatory Visit (INDEPENDENT_AMBULATORY_CARE_PROVIDER_SITE_OTHER): Payer: Medicare Other | Admitting: *Deleted

## 2013-12-10 DIAGNOSIS — Z7901 Long term (current) use of anticoagulants: Secondary | ICD-10-CM | POA: Diagnosis not present

## 2013-12-10 DIAGNOSIS — M999 Biomechanical lesion, unspecified: Secondary | ICD-10-CM | POA: Diagnosis not present

## 2013-12-10 DIAGNOSIS — I4891 Unspecified atrial fibrillation: Secondary | ICD-10-CM | POA: Diagnosis not present

## 2013-12-10 DIAGNOSIS — Z5181 Encounter for therapeutic drug level monitoring: Secondary | ICD-10-CM | POA: Diagnosis not present

## 2013-12-10 DIAGNOSIS — M5430 Sciatica, unspecified side: Secondary | ICD-10-CM | POA: Diagnosis not present

## 2013-12-10 DIAGNOSIS — I4821 Permanent atrial fibrillation: Secondary | ICD-10-CM

## 2013-12-10 LAB — POCT INR: INR: 2.1

## 2013-12-19 ENCOUNTER — Other Ambulatory Visit: Payer: Self-pay | Admitting: *Deleted

## 2013-12-19 MED ORDER — AMLODIPINE BESYLATE 5 MG PO TABS: 5.0000 mg | ORAL_TABLET | Freq: Every day | ORAL | Status: DC

## 2013-12-24 DIAGNOSIS — M5430 Sciatica, unspecified side: Secondary | ICD-10-CM | POA: Diagnosis not present

## 2013-12-24 DIAGNOSIS — M999 Biomechanical lesion, unspecified: Secondary | ICD-10-CM | POA: Diagnosis not present

## 2013-12-31 DIAGNOSIS — E291 Testicular hypofunction: Secondary | ICD-10-CM | POA: Diagnosis not present

## 2013-12-31 DIAGNOSIS — N401 Enlarged prostate with lower urinary tract symptoms: Secondary | ICD-10-CM | POA: Diagnosis not present

## 2014-01-07 ENCOUNTER — Encounter: Payer: Self-pay | Admitting: *Deleted

## 2014-01-09 DIAGNOSIS — M999 Biomechanical lesion, unspecified: Secondary | ICD-10-CM | POA: Diagnosis not present

## 2014-01-09 DIAGNOSIS — M5430 Sciatica, unspecified side: Secondary | ICD-10-CM | POA: Diagnosis not present

## 2014-01-10 ENCOUNTER — Ambulatory Visit (INDEPENDENT_AMBULATORY_CARE_PROVIDER_SITE_OTHER): Payer: Medicare Other | Admitting: *Deleted

## 2014-01-10 DIAGNOSIS — I4891 Unspecified atrial fibrillation: Secondary | ICD-10-CM | POA: Diagnosis not present

## 2014-01-10 DIAGNOSIS — Z5181 Encounter for therapeutic drug level monitoring: Secondary | ICD-10-CM

## 2014-01-10 DIAGNOSIS — Z7901 Long term (current) use of anticoagulants: Secondary | ICD-10-CM

## 2014-01-10 DIAGNOSIS — I4821 Permanent atrial fibrillation: Secondary | ICD-10-CM

## 2014-01-10 LAB — POCT INR: INR: 1.8

## 2014-01-13 DIAGNOSIS — M999 Biomechanical lesion, unspecified: Secondary | ICD-10-CM | POA: Diagnosis not present

## 2014-01-13 DIAGNOSIS — M5430 Sciatica, unspecified side: Secondary | ICD-10-CM | POA: Diagnosis not present

## 2014-01-16 DIAGNOSIS — M999 Biomechanical lesion, unspecified: Secondary | ICD-10-CM | POA: Diagnosis not present

## 2014-01-16 DIAGNOSIS — M5430 Sciatica, unspecified side: Secondary | ICD-10-CM | POA: Diagnosis not present

## 2014-01-31 ENCOUNTER — Encounter: Payer: Self-pay | Admitting: Cardiology

## 2014-01-31 ENCOUNTER — Ambulatory Visit (INDEPENDENT_AMBULATORY_CARE_PROVIDER_SITE_OTHER): Payer: Medicare Other | Admitting: Cardiology

## 2014-01-31 ENCOUNTER — Ambulatory Visit (INDEPENDENT_AMBULATORY_CARE_PROVIDER_SITE_OTHER): Payer: Medicare Other | Admitting: *Deleted

## 2014-01-31 VITALS — BP 110/67 | HR 58 | Ht 72.0 in | Wt 185.0 lb

## 2014-01-31 DIAGNOSIS — Z7901 Long term (current) use of anticoagulants: Secondary | ICD-10-CM | POA: Diagnosis not present

## 2014-01-31 DIAGNOSIS — I4891 Unspecified atrial fibrillation: Secondary | ICD-10-CM | POA: Diagnosis not present

## 2014-01-31 DIAGNOSIS — I4821 Permanent atrial fibrillation: Secondary | ICD-10-CM

## 2014-01-31 DIAGNOSIS — I2510 Atherosclerotic heart disease of native coronary artery without angina pectoris: Secondary | ICD-10-CM

## 2014-01-31 DIAGNOSIS — I38 Endocarditis, valve unspecified: Secondary | ICD-10-CM

## 2014-01-31 DIAGNOSIS — Z5181 Encounter for therapeutic drug level monitoring: Secondary | ICD-10-CM

## 2014-01-31 LAB — POCT INR: INR: 2.1

## 2014-01-31 NOTE — Progress Notes (Signed)
Clinical Summary Benjamin Mason is an 80 y.o.male last seen in April of this year. At that time he was referred for followup ischemic testing. Lexiscan Cardiolite on April 13 showed no diagnostic ST segment abnormalities, perfusion evidence of mild ischemia in the RCA distribution, LVEF 59%. He reported adequate symptom control on medical therapy at that time.  He tells me he has been doing very well, really since adding back Norvasc. He has had no angina symptoms, has been walking and playing golf. We talked about continuing symptom observation, can always consider a heart catheterization if his angina progresses.  Echocardiogram from June 2014 showed mild LVH with LVEF 55-60%, mild to moderate aortic regurgitation, mild to moderate mitral regurgitation, moderate left atrial enlargement, moderate tricuspid regurgitation with PASP 43 mm mercury.   No Known Allergies  Current Outpatient Prescriptions  Medication Sig Dispense Refill  . acetaminophen (TYLENOL ARTHRITIS PAIN) 650 MG CR tablet Take 1,300 mg by mouth 2 (two) times daily.       Marland Kitchen amLODipine (NORVASC) 5 MG tablet Take 1 tablet (5 mg total) by mouth daily.  90 tablet  3  . atenolol (TENORMIN) 50 MG tablet TAKE ONE TABLET ONCE DAILY  90 tablet  6  . finasteride (PROSCAR) 5 MG tablet Take 5 mg by mouth daily.        Marland Kitchen LORazepam (ATIVAN) 2 MG tablet Take 1 mg by mouth daily.       . Multiple Vitamins-Minerals (CENTRUM CARDIO PO) Take 1 tablet by mouth daily.       . nitroGLYCERIN (NITROSTAT) 0.4 MG SL tablet Place 1 tablet (0.4 mg total) under the tongue every 5 (five) minutes as needed for chest pain.  25 tablet  3  . simvastatin (ZOCOR) 20 MG tablet Take 10 mg by mouth at bedtime.       . Tamsulosin HCl (FLOMAX) 0.4 MG CAPS Take 0.4 mg by mouth daily.        . TESTOSTERONE IM Inject 1 mL into the muscle every 14 (fourteen) days.       Marland Kitchen warfarin (COUMADIN) 3 MG tablet Take 3 mg by mouth as directed. MANAGED BY [facility])      .  warfarin (COUMADIN) 3 MG tablet TAKE ONE TABLET BY MOUTH AS DIRECTED  45 tablet  6   No current facility-administered medications for this visit.    Past Medical History  Diagnosis Date  . Permanent atrial fibrillation   . Mixed hyperlipidemia   . Anxiety   . Coronary atherosclerosis of native coronary artery     BMS to RCA and LAD 1995  . Essential hypertension, benign   . IBS (irritable bowel syndrome)   . Mitral regurgitation     Moderate  . Aortic regurgitation     Mild to moderate    Social History Benjamin Mason reports that he quit smoking about 51 years ago. His smoking use included Cigarettes. He has a 50 pack-year smoking history. He has never used smokeless tobacco. Benjamin Mason reports that he does not drink alcohol.  Review of Systems No palpitations, dizziness, syncope. No bleeding problems on Coumadin. Other systems reviewed and negative.  Physical Examination Filed Vitals:   01/31/14 1312  BP: 110/67  Pulse: 58   Filed Weights   01/31/14 1312  Weight: 185 lb (83.915 kg)    Patient appears comfortable at rest.  HEENT: Conjunctiva and lids normal, oropharynx clear.  Neck: Supple, no elevated JVP or carotid bruits, no thyromegaly.  Lungs: Clear to auscultation, nonlabored breathing at rest.  Cardiac: Irregularly irregular, no S3, 2/6 apical systolic murmur, no pericardial rub.  Abdomen: Soft, nontender, bowel sounds present, no guarding or rebound.  Extremities: 1+ edema, distal pulses 2+.  Skin: Warm and dry.  Musculoskeletal: No kyphosis.  Neuropsychiatric: Alert and oriented x3, affect grossly appropriate.   Problem List and Plan   Coronary atherosclerosis of native coronary artery Symptomatically stable. He has likely had some progression since BMS to the RCA and LAD in 1995 based on recent Cardiolite. He is doing well at this time however and would like to continue with observation for now. Certainly if his symptoms worsen, we can always discuss a  cardiac catheterization. Followup arranged in 6 months.  Permanent atrial fibrillation ECG reviewed. Heart rate well controlled. Continue current regimen including Coumadin. INR was 2.1 today.  Valvular heart disease Mild to moderate aortic and mitral regurgitation.    Satira Sark, M.D., F.A.C.C.

## 2014-01-31 NOTE — Patient Instructions (Signed)
Continue all current medications. Your physician wants you to follow up in: 6 months.  You will receive a reminder letter in the mail one-two months in advance.  If you don't receive a letter, please call our office to schedule the follow up appointment   

## 2014-01-31 NOTE — Assessment & Plan Note (Signed)
Symptomatically stable. He has likely had some progression since BMS to the RCA and LAD in 1995 based on recent Cardiolite. He is doing well at this time however and would like to continue with observation for now. Certainly if his symptoms worsen, we can always discuss a cardiac catheterization. Followup arranged in 6 months.

## 2014-01-31 NOTE — Assessment & Plan Note (Signed)
Mild to moderate aortic and mitral regurgitation.

## 2014-01-31 NOTE — Assessment & Plan Note (Signed)
ECG reviewed. Heart rate well controlled. Continue current regimen including Coumadin. INR was 2.1 today.

## 2014-02-03 DIAGNOSIS — M999 Biomechanical lesion, unspecified: Secondary | ICD-10-CM | POA: Diagnosis not present

## 2014-02-03 DIAGNOSIS — M5430 Sciatica, unspecified side: Secondary | ICD-10-CM | POA: Diagnosis not present

## 2014-02-12 ENCOUNTER — Other Ambulatory Visit: Payer: Self-pay | Admitting: *Deleted

## 2014-02-12 MED ORDER — WARFARIN SODIUM 3 MG PO TABS: ORAL_TABLET | ORAL | Status: DC

## 2014-02-19 DIAGNOSIS — E7800 Pure hypercholesterolemia, unspecified: Secondary | ICD-10-CM | POA: Diagnosis not present

## 2014-02-19 DIAGNOSIS — I4891 Unspecified atrial fibrillation: Secondary | ICD-10-CM | POA: Diagnosis not present

## 2014-02-19 DIAGNOSIS — N189 Chronic kidney disease, unspecified: Secondary | ICD-10-CM | POA: Diagnosis not present

## 2014-02-19 DIAGNOSIS — I10 Essential (primary) hypertension: Secondary | ICD-10-CM | POA: Diagnosis not present

## 2014-02-19 DIAGNOSIS — M1990 Unspecified osteoarthritis, unspecified site: Secondary | ICD-10-CM | POA: Diagnosis not present

## 2014-02-20 DIAGNOSIS — M999 Biomechanical lesion, unspecified: Secondary | ICD-10-CM | POA: Diagnosis not present

## 2014-02-20 DIAGNOSIS — M5430 Sciatica, unspecified side: Secondary | ICD-10-CM | POA: Diagnosis not present

## 2014-02-25 DIAGNOSIS — K589 Irritable bowel syndrome without diarrhea: Secondary | ICD-10-CM | POA: Diagnosis not present

## 2014-02-25 DIAGNOSIS — N400 Enlarged prostate without lower urinary tract symptoms: Secondary | ICD-10-CM | POA: Diagnosis not present

## 2014-02-25 DIAGNOSIS — I10 Essential (primary) hypertension: Secondary | ICD-10-CM | POA: Diagnosis not present

## 2014-02-25 DIAGNOSIS — G4700 Insomnia, unspecified: Secondary | ICD-10-CM | POA: Diagnosis not present

## 2014-02-25 DIAGNOSIS — E7800 Pure hypercholesterolemia, unspecified: Secondary | ICD-10-CM | POA: Diagnosis not present

## 2014-02-25 DIAGNOSIS — I4891 Unspecified atrial fibrillation: Secondary | ICD-10-CM | POA: Diagnosis not present

## 2014-02-25 DIAGNOSIS — N189 Chronic kidney disease, unspecified: Secondary | ICD-10-CM | POA: Diagnosis not present

## 2014-02-25 DIAGNOSIS — R413 Other amnesia: Secondary | ICD-10-CM | POA: Diagnosis not present

## 2014-03-04 ENCOUNTER — Ambulatory Visit (INDEPENDENT_AMBULATORY_CARE_PROVIDER_SITE_OTHER): Payer: Medicare Other | Admitting: *Deleted

## 2014-03-04 DIAGNOSIS — Z7901 Long term (current) use of anticoagulants: Secondary | ICD-10-CM

## 2014-03-04 DIAGNOSIS — I4821 Permanent atrial fibrillation: Secondary | ICD-10-CM

## 2014-03-04 DIAGNOSIS — Z5181 Encounter for therapeutic drug level monitoring: Secondary | ICD-10-CM | POA: Diagnosis not present

## 2014-03-04 DIAGNOSIS — I4891 Unspecified atrial fibrillation: Secondary | ICD-10-CM | POA: Diagnosis not present

## 2014-03-04 LAB — POCT INR: INR: 1.9

## 2014-03-06 DIAGNOSIS — M5430 Sciatica, unspecified side: Secondary | ICD-10-CM | POA: Diagnosis not present

## 2014-03-06 DIAGNOSIS — M999 Biomechanical lesion, unspecified: Secondary | ICD-10-CM | POA: Diagnosis not present

## 2014-04-01 ENCOUNTER — Ambulatory Visit (INDEPENDENT_AMBULATORY_CARE_PROVIDER_SITE_OTHER): Payer: Medicare Other | Admitting: *Deleted

## 2014-04-01 DIAGNOSIS — I4891 Unspecified atrial fibrillation: Secondary | ICD-10-CM | POA: Diagnosis not present

## 2014-04-01 DIAGNOSIS — Z7901 Long term (current) use of anticoagulants: Secondary | ICD-10-CM | POA: Diagnosis not present

## 2014-04-01 DIAGNOSIS — I4821 Permanent atrial fibrillation: Secondary | ICD-10-CM

## 2014-04-01 DIAGNOSIS — Z5181 Encounter for therapeutic drug level monitoring: Secondary | ICD-10-CM | POA: Diagnosis not present

## 2014-04-01 LAB — POCT INR: INR: 2

## 2014-04-11 DIAGNOSIS — K589 Irritable bowel syndrome without diarrhea: Secondary | ICD-10-CM | POA: Diagnosis not present

## 2014-04-11 DIAGNOSIS — K5289 Other specified noninfective gastroenteritis and colitis: Secondary | ICD-10-CM | POA: Diagnosis not present

## 2014-04-29 ENCOUNTER — Ambulatory Visit (INDEPENDENT_AMBULATORY_CARE_PROVIDER_SITE_OTHER): Payer: Medicare Other | Admitting: *Deleted

## 2014-04-29 DIAGNOSIS — Z5181 Encounter for therapeutic drug level monitoring: Secondary | ICD-10-CM

## 2014-04-29 DIAGNOSIS — I4891 Unspecified atrial fibrillation: Secondary | ICD-10-CM

## 2014-04-29 DIAGNOSIS — Z7901 Long term (current) use of anticoagulants: Secondary | ICD-10-CM

## 2014-04-29 DIAGNOSIS — I4821 Permanent atrial fibrillation: Secondary | ICD-10-CM

## 2014-04-29 LAB — POCT INR: INR: 1.9

## 2014-05-27 ENCOUNTER — Ambulatory Visit (INDEPENDENT_AMBULATORY_CARE_PROVIDER_SITE_OTHER): Payer: Medicare Other | Admitting: *Deleted

## 2014-05-27 DIAGNOSIS — I4891 Unspecified atrial fibrillation: Secondary | ICD-10-CM | POA: Diagnosis not present

## 2014-05-27 DIAGNOSIS — I4821 Permanent atrial fibrillation: Secondary | ICD-10-CM

## 2014-05-27 DIAGNOSIS — I482 Chronic atrial fibrillation: Secondary | ICD-10-CM

## 2014-05-27 DIAGNOSIS — Z5181 Encounter for therapeutic drug level monitoring: Secondary | ICD-10-CM

## 2014-05-27 DIAGNOSIS — Z7901 Long term (current) use of anticoagulants: Secondary | ICD-10-CM | POA: Diagnosis not present

## 2014-05-27 LAB — POCT INR: INR: 1.8

## 2014-06-02 DIAGNOSIS — Z23 Encounter for immunization: Secondary | ICD-10-CM | POA: Diagnosis not present

## 2014-06-05 DIAGNOSIS — M25561 Pain in right knee: Secondary | ICD-10-CM | POA: Diagnosis not present

## 2014-06-05 DIAGNOSIS — M1711 Unilateral primary osteoarthritis, right knee: Secondary | ICD-10-CM | POA: Diagnosis not present

## 2014-06-09 DIAGNOSIS — M25561 Pain in right knee: Secondary | ICD-10-CM | POA: Diagnosis not present

## 2014-06-17 ENCOUNTER — Ambulatory Visit (INDEPENDENT_AMBULATORY_CARE_PROVIDER_SITE_OTHER): Payer: Medicare Other | Admitting: *Deleted

## 2014-06-17 DIAGNOSIS — Z7901 Long term (current) use of anticoagulants: Secondary | ICD-10-CM | POA: Diagnosis not present

## 2014-06-17 DIAGNOSIS — Z5181 Encounter for therapeutic drug level monitoring: Secondary | ICD-10-CM | POA: Diagnosis not present

## 2014-06-17 DIAGNOSIS — I482 Chronic atrial fibrillation: Secondary | ICD-10-CM

## 2014-06-17 DIAGNOSIS — I4891 Unspecified atrial fibrillation: Secondary | ICD-10-CM

## 2014-06-17 DIAGNOSIS — I4821 Permanent atrial fibrillation: Secondary | ICD-10-CM

## 2014-06-17 LAB — POCT INR: INR: 1.7

## 2014-07-01 ENCOUNTER — Ambulatory Visit (INDEPENDENT_AMBULATORY_CARE_PROVIDER_SITE_OTHER): Payer: Medicare Other | Admitting: *Deleted

## 2014-07-01 DIAGNOSIS — Z7901 Long term (current) use of anticoagulants: Secondary | ICD-10-CM

## 2014-07-01 DIAGNOSIS — I4891 Unspecified atrial fibrillation: Secondary | ICD-10-CM

## 2014-07-01 DIAGNOSIS — Z5181 Encounter for therapeutic drug level monitoring: Secondary | ICD-10-CM

## 2014-07-01 DIAGNOSIS — I482 Chronic atrial fibrillation: Secondary | ICD-10-CM | POA: Diagnosis not present

## 2014-07-01 DIAGNOSIS — I4821 Permanent atrial fibrillation: Secondary | ICD-10-CM

## 2014-07-01 LAB — POCT INR: INR: 2.2

## 2014-07-22 ENCOUNTER — Encounter: Payer: Self-pay | Admitting: Cardiology

## 2014-07-22 ENCOUNTER — Ambulatory Visit (INDEPENDENT_AMBULATORY_CARE_PROVIDER_SITE_OTHER): Payer: Medicare Other | Admitting: Cardiology

## 2014-07-22 ENCOUNTER — Ambulatory Visit (INDEPENDENT_AMBULATORY_CARE_PROVIDER_SITE_OTHER): Payer: Medicare Other | Admitting: *Deleted

## 2014-07-22 VITALS — BP 115/68 | HR 63 | Ht 71.0 in | Wt 185.4 lb

## 2014-07-22 DIAGNOSIS — Z7901 Long term (current) use of anticoagulants: Secondary | ICD-10-CM | POA: Diagnosis not present

## 2014-07-22 DIAGNOSIS — I2510 Atherosclerotic heart disease of native coronary artery without angina pectoris: Secondary | ICD-10-CM

## 2014-07-22 DIAGNOSIS — I482 Chronic atrial fibrillation: Secondary | ICD-10-CM

## 2014-07-22 DIAGNOSIS — I4821 Permanent atrial fibrillation: Secondary | ICD-10-CM

## 2014-07-22 DIAGNOSIS — I38 Endocarditis, valve unspecified: Secondary | ICD-10-CM

## 2014-07-22 DIAGNOSIS — I4891 Unspecified atrial fibrillation: Secondary | ICD-10-CM

## 2014-07-22 DIAGNOSIS — Z5181 Encounter for therapeutic drug level monitoring: Secondary | ICD-10-CM

## 2014-07-22 LAB — POCT INR: INR: 2.3

## 2014-07-22 NOTE — Assessment & Plan Note (Signed)
Continue strategy of heart rate control and anticoagulation, he is asymptomatic.

## 2014-07-22 NOTE — Patient Instructions (Signed)

## 2014-07-22 NOTE — Progress Notes (Signed)
Reason for visit: CAD, atrial fibrillation, valvular heart disease  Clinical Summary Mr. Boyte is an 80 y.o.male last seen in June. He presents for a routine visit. He continues to do very well without recurring angina symptoms on medical therapy including both Norvasc and atenolol.   Lexiscan Cardiolite from April of this year showed no diagnostic ST segment abnormalities, perfusion evidence of mild ischemia in the RCA distribution, LVEF 59%. He has been managed medically for stable angina.   He reports NYHA class II dyspnea, no palpitations, orthopnea, or PND. He has known mild to moderate aortic and mitral regurgitation by echocardiogram from last year.  No Known Allergies  Current Outpatient Prescriptions  Medication Sig Dispense Refill  . acetaminophen (TYLENOL ARTHRITIS PAIN) 650 MG CR tablet Take 1,300 mg by mouth 2 (two) times daily.     Marland Kitchen amLODipine (NORVASC) 5 MG tablet Take 1 tablet (5 mg total) by mouth daily. (Patient taking differently: Take 2.5 mg by mouth daily. ) 90 tablet 3  . atenolol (TENORMIN) 50 MG tablet TAKE ONE TABLET ONCE DAILY 90 tablet 6  . finasteride (PROSCAR) 5 MG tablet Take 5 mg by mouth daily.      Marland Kitchen LORazepam (ATIVAN) 2 MG tablet Take 1 mg by mouth daily.     . nitroGLYCERIN (NITROSTAT) 0.4 MG SL tablet Place 1 tablet (0.4 mg total) under the tongue every 5 (five) minutes as needed for chest pain. 25 tablet 3  . simvastatin (ZOCOR) 10 MG tablet Take 10 mg by mouth every evening.    . Tamsulosin HCl (FLOMAX) 0.4 MG CAPS Take 0.4 mg by mouth daily.      . TESTOSTERONE IM Inject 1 mL into the muscle every 14 (fourteen) days.     Marland Kitchen warfarin (COUMADIN) 3 MG tablet Take 3 mg by mouth as directed. MANAGED BY L[facility] ([facility])     No current facility-administered medications for this visit.    Past Medical History  Diagnosis Date  . Permanent atrial fibrillation   . Mixed hyperlipidemia   . Anxiety   . Coronary atherosclerosis of native coronary artery    BMS to RCA and LAD 1995  . Essential hypertension, benign   . IBS (irritable bowel syndrome)   . Mitral regurgitation     Moderate  . Aortic regurgitation     Mild to moderate    Social History Mr. Frediani reports that he quit smoking about 51 years ago. His smoking use included Cigarettes. He has a 50 pack-year smoking history. He has never used smokeless tobacco. Mr. Stitely reports that he does not drink alcohol.  Review of Systems Complete review of systems negative except as otherwise outlined in the clinical summary and also the following. Good appetite. Some memory trouble.  Physical Examination Filed Vitals:   07/22/14 1125  BP: 115/68  Pulse: 63   Filed Weights   07/22/14 1125  Weight: 185 lb 6.4 oz (84.097 kg)    Patient appears comfortable at rest.  HEENT: Conjunctiva and lids normal, oropharynx clear.  Neck: Supple, no elevated JVP or carotid bruits, no thyromegaly.  Lungs: Clear to auscultation, nonlabored breathing at rest.  Cardiac: Irregularly irregular, no S3, 2/6 apical systolic murmur, no pericardial rub.  Abdomen: Soft, nontender, bowel sounds present, no guarding or rebound.  Extremities: Trace edema, distal pulses 2+.    Problem List and Plan   Coronary atherosclerosis of native coronary artery No recurrent angina symptoms on medical therapy. He has likely had some progression since  BMS to the RCA and LAD in 1995, follow-up Cardiolite noted above. Certainly if his symptoms worsen, we can always discuss a cardiac catheterization. Followup arranged in 6 months.  Permanent atrial fibrillation Continue strategy of heart rate control and anticoagulation, he is asymptomatic.  Valvular heart disease Mild to moderate aortic and mitral regurgitation, asymptomatic.    Satira Sark, M.D., F.A.C.C.

## 2014-07-22 NOTE — Assessment & Plan Note (Signed)
Mild to moderate aortic and mitral regurgitation, asymptomatic.

## 2014-07-22 NOTE — Assessment & Plan Note (Addendum)
No recurrent angina symptoms on medical therapy. He has likely had some progression since BMS to the RCA and LAD in 1995, follow-up Cardiolite noted above. Certainly if his symptoms worsen, we can always discuss a cardiac catheterization. Followup arranged in 6 months.

## 2014-08-19 ENCOUNTER — Ambulatory Visit (INDEPENDENT_AMBULATORY_CARE_PROVIDER_SITE_OTHER): Payer: Medicare Other | Admitting: *Deleted

## 2014-08-19 DIAGNOSIS — I4821 Permanent atrial fibrillation: Secondary | ICD-10-CM

## 2014-08-19 DIAGNOSIS — E780 Pure hypercholesterolemia: Secondary | ICD-10-CM | POA: Diagnosis not present

## 2014-08-19 DIAGNOSIS — I4891 Unspecified atrial fibrillation: Secondary | ICD-10-CM

## 2014-08-19 DIAGNOSIS — Z5181 Encounter for therapeutic drug level monitoring: Secondary | ICD-10-CM

## 2014-08-19 DIAGNOSIS — Z7901 Long term (current) use of anticoagulants: Secondary | ICD-10-CM

## 2014-08-19 DIAGNOSIS — I482 Chronic atrial fibrillation: Secondary | ICD-10-CM | POA: Diagnosis not present

## 2014-08-19 DIAGNOSIS — R739 Hyperglycemia, unspecified: Secondary | ICD-10-CM | POA: Diagnosis not present

## 2014-08-19 DIAGNOSIS — I10 Essential (primary) hypertension: Secondary | ICD-10-CM | POA: Diagnosis not present

## 2014-08-19 LAB — POCT INR: INR: 2.2

## 2014-08-26 DIAGNOSIS — Z1389 Encounter for screening for other disorder: Secondary | ICD-10-CM | POA: Diagnosis not present

## 2014-08-26 DIAGNOSIS — G3184 Mild cognitive impairment, so stated: Secondary | ICD-10-CM | POA: Diagnosis not present

## 2014-08-26 DIAGNOSIS — E782 Mixed hyperlipidemia: Secondary | ICD-10-CM | POA: Diagnosis not present

## 2014-08-26 DIAGNOSIS — I10 Essential (primary) hypertension: Secondary | ICD-10-CM | POA: Diagnosis not present

## 2014-08-26 DIAGNOSIS — I482 Chronic atrial fibrillation: Secondary | ICD-10-CM | POA: Diagnosis not present

## 2014-08-26 DIAGNOSIS — F5101 Primary insomnia: Secondary | ICD-10-CM | POA: Diagnosis not present

## 2014-08-29 ENCOUNTER — Encounter (INDEPENDENT_AMBULATORY_CARE_PROVIDER_SITE_OTHER): Payer: Self-pay | Admitting: *Deleted

## 2014-09-23 ENCOUNTER — Ambulatory Visit (INDEPENDENT_AMBULATORY_CARE_PROVIDER_SITE_OTHER): Payer: Medicare Other | Admitting: *Deleted

## 2014-09-23 DIAGNOSIS — Z7901 Long term (current) use of anticoagulants: Secondary | ICD-10-CM

## 2014-09-23 DIAGNOSIS — Z5181 Encounter for therapeutic drug level monitoring: Secondary | ICD-10-CM | POA: Diagnosis not present

## 2014-09-23 DIAGNOSIS — I4891 Unspecified atrial fibrillation: Secondary | ICD-10-CM | POA: Diagnosis not present

## 2014-09-23 DIAGNOSIS — I4821 Permanent atrial fibrillation: Secondary | ICD-10-CM

## 2014-09-23 DIAGNOSIS — I482 Chronic atrial fibrillation: Secondary | ICD-10-CM

## 2014-09-23 LAB — POCT INR: INR: 1.6

## 2014-10-07 ENCOUNTER — Ambulatory Visit (INDEPENDENT_AMBULATORY_CARE_PROVIDER_SITE_OTHER): Payer: Medicare Other | Admitting: *Deleted

## 2014-10-07 DIAGNOSIS — Z5181 Encounter for therapeutic drug level monitoring: Secondary | ICD-10-CM

## 2014-10-07 DIAGNOSIS — I4891 Unspecified atrial fibrillation: Secondary | ICD-10-CM

## 2014-10-07 DIAGNOSIS — I482 Chronic atrial fibrillation: Secondary | ICD-10-CM | POA: Diagnosis not present

## 2014-10-07 DIAGNOSIS — Z7901 Long term (current) use of anticoagulants: Secondary | ICD-10-CM

## 2014-10-07 DIAGNOSIS — I4821 Permanent atrial fibrillation: Secondary | ICD-10-CM

## 2014-10-07 LAB — POCT INR: INR: 2.1

## 2014-10-29 ENCOUNTER — Other Ambulatory Visit: Payer: Self-pay | Admitting: Cardiology

## 2014-11-04 ENCOUNTER — Ambulatory Visit (INDEPENDENT_AMBULATORY_CARE_PROVIDER_SITE_OTHER): Payer: Medicare Other | Admitting: *Deleted

## 2014-11-04 DIAGNOSIS — I4891 Unspecified atrial fibrillation: Secondary | ICD-10-CM | POA: Diagnosis not present

## 2014-11-04 DIAGNOSIS — I482 Chronic atrial fibrillation: Secondary | ICD-10-CM | POA: Diagnosis not present

## 2014-11-04 DIAGNOSIS — Z7901 Long term (current) use of anticoagulants: Secondary | ICD-10-CM

## 2014-11-04 DIAGNOSIS — I4821 Permanent atrial fibrillation: Secondary | ICD-10-CM

## 2014-11-04 DIAGNOSIS — Z5181 Encounter for therapeutic drug level monitoring: Secondary | ICD-10-CM

## 2014-11-04 LAB — POCT INR: INR: 2.1

## 2014-11-18 DIAGNOSIS — H538 Other visual disturbances: Secondary | ICD-10-CM | POA: Diagnosis not present

## 2014-11-19 DIAGNOSIS — I482 Chronic atrial fibrillation: Secondary | ICD-10-CM | POA: Diagnosis not present

## 2014-11-19 DIAGNOSIS — G459 Transient cerebral ischemic attack, unspecified: Secondary | ICD-10-CM | POA: Diagnosis not present

## 2014-11-21 DIAGNOSIS — R42 Dizziness and giddiness: Secondary | ICD-10-CM | POA: Diagnosis not present

## 2014-11-21 DIAGNOSIS — R930 Abnormal findings on diagnostic imaging of skull and head, not elsewhere classified: Secondary | ICD-10-CM | POA: Diagnosis not present

## 2014-11-21 DIAGNOSIS — H539 Unspecified visual disturbance: Secondary | ICD-10-CM | POA: Diagnosis not present

## 2014-11-21 DIAGNOSIS — H538 Other visual disturbances: Secondary | ICD-10-CM | POA: Diagnosis not present

## 2014-12-01 DIAGNOSIS — R931 Abnormal findings on diagnostic imaging of heart and coronary circulation: Secondary | ICD-10-CM | POA: Diagnosis not present

## 2014-12-01 DIAGNOSIS — I482 Chronic atrial fibrillation: Secondary | ICD-10-CM | POA: Diagnosis not present

## 2014-12-01 DIAGNOSIS — I6523 Occlusion and stenosis of bilateral carotid arteries: Secondary | ICD-10-CM | POA: Diagnosis not present

## 2014-12-01 DIAGNOSIS — I351 Nonrheumatic aortic (valve) insufficiency: Secondary | ICD-10-CM | POA: Diagnosis not present

## 2014-12-01 DIAGNOSIS — G459 Transient cerebral ischemic attack, unspecified: Secondary | ICD-10-CM | POA: Diagnosis not present

## 2014-12-01 DIAGNOSIS — I4891 Unspecified atrial fibrillation: Secondary | ICD-10-CM | POA: Diagnosis not present

## 2014-12-01 DIAGNOSIS — I340 Nonrheumatic mitral (valve) insufficiency: Secondary | ICD-10-CM | POA: Diagnosis not present

## 2014-12-01 DIAGNOSIS — H538 Other visual disturbances: Secondary | ICD-10-CM | POA: Diagnosis not present

## 2014-12-02 ENCOUNTER — Telehealth: Payer: Self-pay | Admitting: Cardiology

## 2014-12-02 ENCOUNTER — Ambulatory Visit (INDEPENDENT_AMBULATORY_CARE_PROVIDER_SITE_OTHER): Payer: Medicare Other | Admitting: *Deleted

## 2014-12-02 ENCOUNTER — Other Ambulatory Visit: Payer: Self-pay | Admitting: Cardiology

## 2014-12-02 DIAGNOSIS — Z7901 Long term (current) use of anticoagulants: Secondary | ICD-10-CM | POA: Diagnosis not present

## 2014-12-02 DIAGNOSIS — I482 Chronic atrial fibrillation: Secondary | ICD-10-CM | POA: Diagnosis not present

## 2014-12-02 DIAGNOSIS — Z5181 Encounter for therapeutic drug level monitoring: Secondary | ICD-10-CM | POA: Diagnosis not present

## 2014-12-02 DIAGNOSIS — I4891 Unspecified atrial fibrillation: Secondary | ICD-10-CM | POA: Diagnosis not present

## 2014-12-02 DIAGNOSIS — I4821 Permanent atrial fibrillation: Secondary | ICD-10-CM

## 2014-12-02 LAB — POCT INR: INR: 1.9

## 2014-12-02 NOTE — Telephone Encounter (Signed)
Noted  

## 2014-12-02 NOTE — Telephone Encounter (Signed)
PATIENT WALK IN   He had a Cartoid Doppler and Echo done at [facility] ordered by Dr Quintin Alto for his eye being cloudy. Said that he requested records be sent to Dr Domenic Polite also

## 2014-12-08 ENCOUNTER — Encounter (INDEPENDENT_AMBULATORY_CARE_PROVIDER_SITE_OTHER): Payer: Self-pay | Admitting: Internal Medicine

## 2014-12-08 ENCOUNTER — Ambulatory Visit (INDEPENDENT_AMBULATORY_CARE_PROVIDER_SITE_OTHER): Payer: Medicare Other | Admitting: Internal Medicine

## 2014-12-08 VITALS — BP 118/62 | HR 66 | Temp 98.2°F | Resp 18 | Ht 72.0 in | Wt 188.6 lb

## 2014-12-08 DIAGNOSIS — K589 Irritable bowel syndrome without diarrhea: Secondary | ICD-10-CM | POA: Diagnosis not present

## 2014-12-08 DIAGNOSIS — Z86010 Personal history of colonic polyps: Secondary | ICD-10-CM | POA: Diagnosis not present

## 2014-12-08 NOTE — Patient Instructions (Signed)
Try decreasing dicyclomine to 10 mg twice daily; if abdominal pain relapses can take up to three doses per day.

## 2014-12-08 NOTE — Progress Notes (Signed)
Presenting complaint;  Follow-up for abdominal pain dysphagia and history of colonic adenoma.  Subjective:  Benjamin Mason is a 81 year old Caucasian male who is here for scheduled visit. He was last seen one year ago. He feels well. He rarely experiences heartburn and he has not had dysphagia since esophageal dilation of December 2013. He states that he chews his food thoroughly and eat slowly. He does not experience abdominal pain as long as he stays on dicyclomine. He states he's had stomach problems all his life. On most days he takes 3 doses of dicyclomine. He has dry mouth but he had dry mouth even before he started to take dicyclomine. He is not having any other side effects. He takes 2.6 g of Tylenol daily. He says he gets LFTs checked periodically and they've always been normal. He states last month he experienced like he had cloud over his right eye. Eye exam was normal. He then underwent further testing by Dr. Quintin Alto and workup was negative including MR brain, carotid Doppler ultrasound and echo. He states this symptom has improved great deal. He has not had this symptom involving left eye.   Current Medications: Outpatient Encounter Prescriptions as of 12/08/2014  Medication Sig  . acetaminophen (TYLENOL ARTHRITIS PAIN) 650 MG CR tablet Take 1,300 mg by mouth 2 (two) times daily.   Marland Kitchen amLODipine (NORVASC) 5 MG tablet Take 1 tablet (5 mg total) by mouth daily. (Patient taking differently: Take 2.5 mg by mouth daily. )  . aspirin 81 MG tablet Take 81 mg by mouth daily.  Marland Kitchen atenolol (TENORMIN) 50 MG tablet TAKE ONE TABLET BY MOUTH ONCE DAILY  . dicyclomine (BENTYL) 10 MG capsule Take 10 mg by mouth 4 (four) times daily.  . finasteride (PROSCAR) 5 MG tablet Take 5 mg by mouth daily.    . Lactase (LACTAID PO) Take by mouth as needed. Patient will chew tablets when he eats dairy products.  . nitroGLYCERIN (NITROSTAT) 0.4 MG SL tablet Place 1 tablet (0.4 mg total) under the tongue every 5 (five) minutes  as needed for chest pain.  . Probiotic Product (PROBIOTIC DAILY PO) Take by mouth daily. Ultra Flora  . simvastatin (ZOCOR) 10 MG tablet Take 10 mg by mouth every evening.  . Tamsulosin HCl (FLOMAX) 0.4 MG CAPS Take 0.4 mg by mouth daily.    . TESTOSTERONE IM Inject 1 mL into the muscle every 14 (fourteen) days.   Marland Kitchen warfarin (COUMADIN) 3 MG tablet Take 1 tablet daily except 1 1/2 tablets on Tuesdays, Thursdays and Saturdays  . [DISCONTINUED] LORazepam (ATIVAN) 2 MG tablet Take 1 mg by mouth daily.      Objective: Blood pressure 118/62, pulse 66, temperature 98.2 F (36.8 C), temperature source Oral, resp. rate 18, height 6' (1.829 m), weight 188 lb 9.6 oz (85.548 kg). Patient is alert and in no acute distress. Conjunctiva is pink. Sclera is nonicteric Oropharyngeal mucosa is normal. No neck masses or thyromegaly noted. Cardiac exam with regular rhythm normal S1 and S2. No murmur or gallop noted. Lungs are clear to auscultation. Abdomen is symmetrical soft and nontender without organomegaly or masses. No LE edema or clubbing noted.    Assessment:  #1. Dysphagia. He responded to esophageal dilation in December 2013 even though no structural abnormality was identified. He possibly has esophageal motility disorder and he appears to be doing well by chewing his food poorly and eating slowly. No further workup unless symptoms progress #2. Irritable bowel syndrome. He is doing well with dicyclomine and  I would like for him to try decreasing the dose and see if this is possible. #3. History of colonic adenomas. He had 2 polyps removed on his colonoscopy of December 2013 and third polyp was not found on the way out. Will consider colonoscopy in December 2018 as long as he remains in good health.  Plan:  Try decreasing dicyclomine dose to 10 mg by mouth twice a day. Call if swallowing difficulty recurs. Office visit in 2 years.

## 2014-12-16 ENCOUNTER — Other Ambulatory Visit: Payer: Self-pay | Admitting: *Deleted

## 2014-12-16 MED ORDER — AMLODIPINE BESYLATE 2.5 MG PO TABS: 2.5000 mg | ORAL_TABLET | Freq: Every day | ORAL | Status: DC

## 2014-12-23 ENCOUNTER — Ambulatory Visit (INDEPENDENT_AMBULATORY_CARE_PROVIDER_SITE_OTHER): Payer: Medicare Other | Admitting: *Deleted

## 2014-12-23 DIAGNOSIS — I4821 Permanent atrial fibrillation: Secondary | ICD-10-CM

## 2014-12-23 DIAGNOSIS — I4891 Unspecified atrial fibrillation: Secondary | ICD-10-CM | POA: Diagnosis not present

## 2014-12-23 DIAGNOSIS — Z7901 Long term (current) use of anticoagulants: Secondary | ICD-10-CM | POA: Diagnosis not present

## 2014-12-23 DIAGNOSIS — I482 Chronic atrial fibrillation: Secondary | ICD-10-CM | POA: Diagnosis not present

## 2014-12-23 DIAGNOSIS — Z5181 Encounter for therapeutic drug level monitoring: Secondary | ICD-10-CM | POA: Diagnosis not present

## 2014-12-23 LAB — POCT INR: INR: 1.8

## 2015-01-13 ENCOUNTER — Ambulatory Visit (INDEPENDENT_AMBULATORY_CARE_PROVIDER_SITE_OTHER): Payer: Medicare Other | Admitting: *Deleted

## 2015-01-13 DIAGNOSIS — Z7901 Long term (current) use of anticoagulants: Secondary | ICD-10-CM | POA: Diagnosis not present

## 2015-01-13 DIAGNOSIS — Z5181 Encounter for therapeutic drug level monitoring: Secondary | ICD-10-CM | POA: Diagnosis not present

## 2015-01-13 DIAGNOSIS — I4891 Unspecified atrial fibrillation: Secondary | ICD-10-CM | POA: Diagnosis not present

## 2015-01-13 DIAGNOSIS — I482 Chronic atrial fibrillation: Secondary | ICD-10-CM | POA: Diagnosis not present

## 2015-01-13 DIAGNOSIS — I4821 Permanent atrial fibrillation: Secondary | ICD-10-CM

## 2015-01-13 LAB — POCT INR: INR: 2.9

## 2015-01-22 ENCOUNTER — Encounter: Payer: Self-pay | Admitting: Cardiology

## 2015-01-22 ENCOUNTER — Ambulatory Visit (INDEPENDENT_AMBULATORY_CARE_PROVIDER_SITE_OTHER): Payer: Medicare Other | Admitting: Cardiology

## 2015-01-22 VITALS — BP 128/70 | HR 60 | Ht 72.0 in | Wt 189.0 lb

## 2015-01-22 DIAGNOSIS — I10 Essential (primary) hypertension: Secondary | ICD-10-CM | POA: Diagnosis not present

## 2015-01-22 DIAGNOSIS — I2510 Atherosclerotic heart disease of native coronary artery without angina pectoris: Secondary | ICD-10-CM | POA: Diagnosis not present

## 2015-01-22 DIAGNOSIS — I482 Chronic atrial fibrillation, unspecified: Secondary | ICD-10-CM

## 2015-01-22 DIAGNOSIS — I38 Endocarditis, valve unspecified: Secondary | ICD-10-CM | POA: Diagnosis not present

## 2015-01-22 NOTE — Patient Instructions (Signed)
Your physician has recommended you make the following change in your medication:  Stop aspirin. Continue all other medications the same. Your physician recommends that you schedule a follow-up appointment in: 6 months. You will receive a reminder letter in the mail in about 4 months reminding you to call and schedule your appointment. If you don't receive this letter, please contact our office. 

## 2015-01-22 NOTE — Progress Notes (Signed)
Cardiology Office Note  Date: 01/22/2015   ID: BRADLY SANGIOVANNI, DOB 07/14/1931, MRN [id number]  PCP: Manon Hilding, MD  Primary Cardiologist: Rozann Lesches, MD   Chief Complaint  Patient presents with  . Coronary Artery Disease  . Atrial Fibrillation    History of Present Illness: Benjamin Mason is an 81 y.o. male last seen in December 2015. He presents for a routine follow-up visit today. States that he feels well, plays golf 3 days a week, also walks with his wife for exercise. He does not endorse any angina symptoms, no nitroglycerin use. He has had no palpitations.  Most recent ischemic testing and echocardiogram are outlined below.  He continues to follow in the anticoagulation clinic on Coumadin. No reported bleeding problems, last INR 2.9.  I see that he is now on low-dose aspirin, this was added by his primary care provider with concerns about possible cerebrovascular symptoms. He was having some cloudiness to his vision. States that he saw an ophthalmologist without any acute findings, also ultimately had a brain MRI, carotid Dopplers, and echocardiogram. He recalls reassuring results. We discussed stopping aspirin since he is concurrently on Coumadin, to reduce his bleeding risk.   Past Medical History  Diagnosis Date  . Permanent atrial fibrillation   . Mixed hyperlipidemia   . Anxiety   . Coronary atherosclerosis of native coronary artery     BMS to RCA and LAD 1995  . Essential hypertension, benign   . IBS (irritable bowel syndrome)   . Mitral regurgitation     Moderate  . Aortic regurgitation     Mild to moderate  . Lactose intolerance     Past Surgical History  Procedure Laterality Date  . Left inguinal hernia    . Penile prosthesis implant    . Colonoscopy with esophagogastroduodenoscopy (egd)  08/03/2012    Procedure: COLONOSCOPY WITH ESOPHAGOGASTRODUODENOSCOPY (EGD);  Surgeon: Rogene Houston, MD;  [facility];  Service:  Endoscopy;  Laterality: N/A;  215  . Balloon dilation  08/03/2012    Procedure: BALLOON DILATION;  Surgeon: Rogene Houston, MD;  [facility];  Service: Endoscopy;  Laterality: N/A;  Venia Minks dilation  08/03/2012    Procedure: Venia Minks DILATION;  Surgeon: Rogene Houston, MD;  [facility];  Service: Endoscopy;  Laterality: N/A;  . Savory dilation  08/03/2012    Procedure: SAVORY DILATION;  Surgeon: Rogene Houston, MD;  [facility];  Service: Endoscopy;  Laterality: N/A;    Current Outpatient Prescriptions  Medication Sig Dispense Refill  . acetaminophen (TYLENOL ARTHRITIS PAIN) 650 MG CR tablet Take 1,300 mg by mouth 2 (two) times daily.     Marland Kitchen amLODipine (NORVASC) 2.5 MG tablet Take 1 tablet (2.5 mg total) by mouth daily. 90 tablet 3  . atenolol (TENORMIN) 50 MG tablet TAKE ONE TABLET BY MOUTH ONCE DAILY 90 tablet 3  . dicyclomine (BENTYL) 10 MG capsule Take 10 mg by mouth 3 (three) times daily before meals.  6  . finasteride (PROSCAR) 5 MG tablet Take 5 mg by mouth daily.      . Lactase (LACTAID PO) Take by mouth as needed. Patient will chew tablets when he eats dairy products.    . nitroGLYCERIN (NITROSTAT) 0.4 MG SL tablet Place 1 tablet (0.4 mg total) under the tongue every 5 (five) minutes as needed for chest pain. 25 tablet 3  . Probiotic Product (PROBIOTIC DAILY PO) Take by mouth daily. Ultra  Flora    . simvastatin (ZOCOR) 10 MG tablet Take 10 mg by mouth every evening.    . Tamsulosin HCl (FLOMAX) 0.4 MG CAPS Take 0.4 mg by mouth daily.      . TESTOSTERONE IM Inject 1 mL into the muscle every 14 (fourteen) days.     Marland Kitchen warfarin (COUMADIN) 3 MG tablet Take 1 tablet daily except 1 1/2 tablets on Tuesdays, Thursdays and Saturdays 45 tablet 6   No current facility-administered medications for this visit.    Allergies:  Review of patient's allergies indicates no known allergies.   Social History: The patient  reports that he quit smoking about 52  years ago. His smoking use included Cigarettes. He has a 50 pack-year smoking history. He has never used smokeless tobacco. He reports that he does not drink alcohol or use illicit drugs.   ROS:  Please see the history of present illness. Otherwise, complete review of systems is positive for none.  All other systems are reviewed and negative.   Physical Exam: VS:  BP 128/70 mmHg  Pulse 60  Ht 6' (1.829 m)  Wt 189 lb (85.73 kg)  BMI 25.63 kg/m2  SpO2 95%, BMI Body mass index is 25.63 kg/(m^2).  Wt Readings from Last 3 Encounters:  01/22/15 189 lb (85.73 kg)  12/08/14 188 lb 9.6 oz (85.548 kg)  07/22/14 185 lb 6.4 oz (84.097 kg)     Patient appears comfortable at rest.  HEENT: Conjunctiva and lids normal, oropharynx clear.  Neck: Supple, no elevated JVP or carotid bruits, no thyromegaly.  Lungs: Clear to auscultation, nonlabored breathing at rest.  Cardiac: Irregularly irregular, no S3, 2/6 apical systolic murmur, no pericardial rub.  Abdomen: Soft, nontender, bowel sounds present, no guarding or rebound.  Extremities: Trace edema, distal pulses 2+.    ECG: ECG is ordered today and shows rate-controlled atrial fibrillation with low voltage.   Other Studies Reviewed Today:  1. Lexiscan Cardiolite from April 2015 showed no diagnostic ST segment abnormalities, perfusion evidence of mild ischemia in the RCA distribution, LVEF 59%.   2. Echocardiogram 02/07/2013: Study Conclusions  - Left ventricle: The cavity size was normal. There was mild concentric hypertrophy. Systolic function was normal. The estimated ejection fraction was in the range of 55% to 60%. Wall motion was normal; there were no regional wall motion abnormalities. The study is not technically sufficient to allow evaluation of LV diastolic function. - Aortic valve: Mildly calcified annulus. Trileaflet; mildly calcified leaflets. Left coronary cusp mobility was mildly restricted. Mild to  moderate regurgitation. Mean gradient: 31mm Hg (S). - Mitral valve: Calcified annulus. Mildly thickened leaflets . Mild to moderate regurgitation. - Left atrium: The atrium was moderately dilated. - Right ventricle: The cavity size was mildly dilated. - Right atrium: The atrium was moderately dilated. Central venous pressure: 67mm Hg (est). - Tricuspid valve: Moderate regurgitation. - Pulmonary arteries: Systolic pressure was mildly increased. PA peak pressure: 61mm Hg (S). PA pressure: 46mm Hg (ED). - Pericardium, extracardiac: There was no pericardial effusion. Impressions:  - Unable to compare directly with previous study from June 2013. There is mild LVH with LVEF 55-60%. Indeterminate diastolic function in the setting of atrial fibrillation. Mildly thickened mitral valve with mild to moderate mitral regurgitation. Moderate biatrial enlargement noted. Sclerotic aortic valve with mild to moderate aortic regurgitation. Moderate tricuspid regurgitation with PASP 41 mmHg based on estimated CVP.  Assessment and Plan:  1. Symptomatically stable CAD status post BMS to the RCA and LAD in 1995.  Most recent ischemic evaluation i outlined above. He does not endorse any progressive angina symptoms on medical therapy.  2. Chronic atrial fibrillation, continue atenolol and Coumadin. I asked him to stop aspirin for now.  3. History of valvular heart disease, symptomatically stable. Recent echocardiogram outlined above. No change on examination.  4. Essential hypertension, blood pressure is normal today.  Current medicines were reviewed with the patient today.   Orders Placed This Encounter  Procedures  . EKG 12-Lead    Disposition: FU with me in 6 months.   Signed, Satira Sark, MD, [facility] 01/22/2015 2:21 PM    [facility] [facility] [place], [place], [place] [address] Phone: [phone]; Fax: [phone]

## 2015-02-03 ENCOUNTER — Ambulatory Visit (INDEPENDENT_AMBULATORY_CARE_PROVIDER_SITE_OTHER): Payer: Medicare Other | Admitting: *Deleted

## 2015-02-03 DIAGNOSIS — Z7901 Long term (current) use of anticoagulants: Secondary | ICD-10-CM | POA: Diagnosis not present

## 2015-02-03 DIAGNOSIS — I4891 Unspecified atrial fibrillation: Secondary | ICD-10-CM

## 2015-02-03 DIAGNOSIS — Z5181 Encounter for therapeutic drug level monitoring: Secondary | ICD-10-CM

## 2015-02-03 DIAGNOSIS — I482 Chronic atrial fibrillation: Secondary | ICD-10-CM | POA: Diagnosis not present

## 2015-02-03 DIAGNOSIS — I4821 Permanent atrial fibrillation: Secondary | ICD-10-CM

## 2015-02-03 LAB — POCT INR: INR: 2.3

## 2015-02-12 DIAGNOSIS — E782 Mixed hyperlipidemia: Secondary | ICD-10-CM | POA: Diagnosis not present

## 2015-02-12 DIAGNOSIS — R739 Hyperglycemia, unspecified: Secondary | ICD-10-CM | POA: Diagnosis not present

## 2015-02-12 DIAGNOSIS — I10 Essential (primary) hypertension: Secondary | ICD-10-CM | POA: Diagnosis not present

## 2015-02-19 DIAGNOSIS — I482 Chronic atrial fibrillation: Secondary | ICD-10-CM | POA: Diagnosis not present

## 2015-02-19 DIAGNOSIS — I10 Essential (primary) hypertension: Secondary | ICD-10-CM | POA: Diagnosis not present

## 2015-02-19 DIAGNOSIS — E782 Mixed hyperlipidemia: Secondary | ICD-10-CM | POA: Diagnosis not present

## 2015-02-19 DIAGNOSIS — F5101 Primary insomnia: Secondary | ICD-10-CM | POA: Diagnosis not present

## 2015-02-19 DIAGNOSIS — G3184 Mild cognitive impairment, so stated: Secondary | ICD-10-CM | POA: Diagnosis not present

## 2015-02-19 DIAGNOSIS — Z23 Encounter for immunization: Secondary | ICD-10-CM | POA: Diagnosis not present

## 2015-02-19 DIAGNOSIS — N183 Chronic kidney disease, stage 3 (moderate): Secondary | ICD-10-CM | POA: Diagnosis not present

## 2015-03-03 ENCOUNTER — Ambulatory Visit (INDEPENDENT_AMBULATORY_CARE_PROVIDER_SITE_OTHER): Payer: Medicare Other | Admitting: *Deleted

## 2015-03-03 DIAGNOSIS — I482 Chronic atrial fibrillation: Secondary | ICD-10-CM | POA: Diagnosis not present

## 2015-03-03 DIAGNOSIS — I4891 Unspecified atrial fibrillation: Secondary | ICD-10-CM

## 2015-03-03 DIAGNOSIS — Z7901 Long term (current) use of anticoagulants: Secondary | ICD-10-CM

## 2015-03-03 DIAGNOSIS — I4821 Permanent atrial fibrillation: Secondary | ICD-10-CM

## 2015-03-03 DIAGNOSIS — Z5181 Encounter for therapeutic drug level monitoring: Secondary | ICD-10-CM | POA: Diagnosis not present

## 2015-03-03 LAB — POCT INR: INR: 1.9

## 2015-03-26 ENCOUNTER — Ambulatory Visit (INDEPENDENT_AMBULATORY_CARE_PROVIDER_SITE_OTHER): Payer: Medicare Other | Admitting: *Deleted

## 2015-03-26 DIAGNOSIS — I482 Chronic atrial fibrillation: Secondary | ICD-10-CM | POA: Diagnosis not present

## 2015-03-26 DIAGNOSIS — I4821 Permanent atrial fibrillation: Secondary | ICD-10-CM

## 2015-03-26 DIAGNOSIS — I4891 Unspecified atrial fibrillation: Secondary | ICD-10-CM

## 2015-03-26 DIAGNOSIS — Z7901 Long term (current) use of anticoagulants: Secondary | ICD-10-CM | POA: Diagnosis not present

## 2015-03-26 DIAGNOSIS — Z5181 Encounter for therapeutic drug level monitoring: Secondary | ICD-10-CM

## 2015-03-26 LAB — POCT INR: INR: 1.9

## 2015-04-15 DIAGNOSIS — N138 Other obstructive and reflux uropathy: Secondary | ICD-10-CM | POA: Diagnosis not present

## 2015-04-15 DIAGNOSIS — E291 Testicular hypofunction: Secondary | ICD-10-CM | POA: Diagnosis not present

## 2015-04-15 DIAGNOSIS — N5201 Erectile dysfunction due to arterial insufficiency: Secondary | ICD-10-CM | POA: Diagnosis not present

## 2015-04-15 DIAGNOSIS — R351 Nocturia: Secondary | ICD-10-CM | POA: Diagnosis not present

## 2015-04-15 DIAGNOSIS — N401 Enlarged prostate with lower urinary tract symptoms: Secondary | ICD-10-CM | POA: Diagnosis not present

## 2015-05-07 ENCOUNTER — Ambulatory Visit (INDEPENDENT_AMBULATORY_CARE_PROVIDER_SITE_OTHER): Payer: Medicare Other | Admitting: *Deleted

## 2015-05-07 DIAGNOSIS — I482 Chronic atrial fibrillation: Secondary | ICD-10-CM | POA: Diagnosis not present

## 2015-05-07 DIAGNOSIS — I4891 Unspecified atrial fibrillation: Secondary | ICD-10-CM | POA: Diagnosis not present

## 2015-05-07 DIAGNOSIS — I4821 Permanent atrial fibrillation: Secondary | ICD-10-CM

## 2015-05-07 DIAGNOSIS — Z7901 Long term (current) use of anticoagulants: Secondary | ICD-10-CM | POA: Diagnosis not present

## 2015-05-07 DIAGNOSIS — Z5181 Encounter for therapeutic drug level monitoring: Secondary | ICD-10-CM | POA: Diagnosis not present

## 2015-05-07 LAB — POCT INR: INR: 2.3

## 2015-06-02 ENCOUNTER — Ambulatory Visit (INDEPENDENT_AMBULATORY_CARE_PROVIDER_SITE_OTHER): Payer: Medicare Other | Admitting: *Deleted

## 2015-06-02 DIAGNOSIS — Z7901 Long term (current) use of anticoagulants: Secondary | ICD-10-CM | POA: Diagnosis not present

## 2015-06-02 DIAGNOSIS — I4891 Unspecified atrial fibrillation: Secondary | ICD-10-CM

## 2015-06-02 DIAGNOSIS — I482 Chronic atrial fibrillation: Secondary | ICD-10-CM

## 2015-06-02 DIAGNOSIS — I4821 Permanent atrial fibrillation: Secondary | ICD-10-CM

## 2015-06-02 DIAGNOSIS — L570 Actinic keratosis: Secondary | ICD-10-CM | POA: Diagnosis not present

## 2015-06-02 DIAGNOSIS — Z5181 Encounter for therapeutic drug level monitoring: Secondary | ICD-10-CM

## 2015-06-02 DIAGNOSIS — L608 Other nail disorders: Secondary | ICD-10-CM | POA: Diagnosis not present

## 2015-06-02 LAB — POCT INR: INR: 2

## 2015-06-08 ENCOUNTER — Other Ambulatory Visit: Payer: Self-pay | Admitting: Cardiology

## 2015-06-10 DIAGNOSIS — G459 Transient cerebral ischemic attack, unspecified: Secondary | ICD-10-CM | POA: Diagnosis not present

## 2015-06-10 DIAGNOSIS — I482 Chronic atrial fibrillation: Secondary | ICD-10-CM | POA: Diagnosis not present

## 2015-06-11 DIAGNOSIS — Z23 Encounter for immunization: Secondary | ICD-10-CM | POA: Diagnosis not present

## 2015-06-12 DIAGNOSIS — H5461 Unqualified visual loss, right eye, normal vision left eye: Secondary | ICD-10-CM | POA: Diagnosis not present

## 2015-06-12 DIAGNOSIS — I739 Peripheral vascular disease, unspecified: Secondary | ICD-10-CM | POA: Diagnosis not present

## 2015-06-12 DIAGNOSIS — R51 Headache: Secondary | ICD-10-CM | POA: Diagnosis not present

## 2015-06-30 ENCOUNTER — Ambulatory Visit (INDEPENDENT_AMBULATORY_CARE_PROVIDER_SITE_OTHER): Payer: Medicare Other | Admitting: *Deleted

## 2015-06-30 DIAGNOSIS — I4891 Unspecified atrial fibrillation: Secondary | ICD-10-CM | POA: Diagnosis not present

## 2015-06-30 DIAGNOSIS — Z5181 Encounter for therapeutic drug level monitoring: Secondary | ICD-10-CM

## 2015-06-30 DIAGNOSIS — I482 Chronic atrial fibrillation: Secondary | ICD-10-CM

## 2015-06-30 DIAGNOSIS — I4821 Permanent atrial fibrillation: Secondary | ICD-10-CM

## 2015-06-30 DIAGNOSIS — Z7901 Long term (current) use of anticoagulants: Secondary | ICD-10-CM

## 2015-06-30 LAB — POCT INR: INR: 2.3

## 2015-07-28 ENCOUNTER — Ambulatory Visit (INDEPENDENT_AMBULATORY_CARE_PROVIDER_SITE_OTHER): Payer: Medicare Other | Admitting: Cardiology

## 2015-07-28 ENCOUNTER — Ambulatory Visit (INDEPENDENT_AMBULATORY_CARE_PROVIDER_SITE_OTHER): Payer: Medicare Other | Admitting: *Deleted

## 2015-07-28 ENCOUNTER — Encounter: Payer: Self-pay | Admitting: Cardiology

## 2015-07-28 VITALS — BP 123/74 | HR 66 | Ht 72.0 in | Wt 187.6 lb

## 2015-07-28 DIAGNOSIS — E782 Mixed hyperlipidemia: Secondary | ICD-10-CM | POA: Diagnosis not present

## 2015-07-28 DIAGNOSIS — I4891 Unspecified atrial fibrillation: Secondary | ICD-10-CM

## 2015-07-28 DIAGNOSIS — I482 Chronic atrial fibrillation, unspecified: Secondary | ICD-10-CM

## 2015-07-28 DIAGNOSIS — Z7901 Long term (current) use of anticoagulants: Secondary | ICD-10-CM

## 2015-07-28 DIAGNOSIS — I2510 Atherosclerotic heart disease of native coronary artery without angina pectoris: Secondary | ICD-10-CM

## 2015-07-28 DIAGNOSIS — Z5181 Encounter for therapeutic drug level monitoring: Secondary | ICD-10-CM

## 2015-07-28 DIAGNOSIS — I4821 Permanent atrial fibrillation: Secondary | ICD-10-CM

## 2015-07-28 LAB — POCT INR: INR: 2.4

## 2015-07-28 NOTE — Progress Notes (Signed)
Cardiology Office Note  Date: 07/28/2015   ID: Benjamin Mason, DOB Apr 07, 1931, MRN [id number]  PCP: Manon Hilding, MD  Primary Cardiologist: Rozann Lesches, MD   Chief Complaint  Patient presents with  . Coronary Artery Disease  . Atrial Fibrillation    History of Present Illness: Benjamin Mason is an 81 y.o. male last seen in June. He presents for routine follow-up. Continues to do very well, no significant angina symptoms on medical therapy. We have been managing him medically, Cardiolite study from last year is reviewed below. He has preferred to hold off on cardiac catheterization unless he has any escalating symptoms. Continues to walk for exercise and plays golf when the weather allows.  He continues on Coumadin with follow-up in the anticoagulation clinic. No bleeding problems reported. He was placed back on baby aspirin daily by Dr. Quintin Alto with concerns about possible intermittent TIA symptoms.  He has not required any interval use of nitroglycerin.  Past Medical History  Diagnosis Date  . Permanent atrial fibrillation ([place])   . Mixed hyperlipidemia   . Anxiety   . Coronary atherosclerosis of native coronary artery     BMS to RCA and LAD 1995  . Essential hypertension, benign   . IBS (irritable bowel syndrome)   . Mitral regurgitation     Moderate  . Aortic regurgitation     Mild to moderate  . Lactose intolerance     Current Outpatient Prescriptions  Medication Sig Dispense Refill  . acetaminophen (TYLENOL ARTHRITIS PAIN) 650 MG CR tablet Take 1,300 mg by mouth 2 (two) times daily.     Marland Kitchen amLODipine (NORVASC) 2.5 MG tablet Take 1 tablet (2.5 mg total) by mouth daily. 90 tablet 3  . aspirin 81 MG tablet Take 81 mg by mouth daily.    Marland Kitchen atenolol (TENORMIN) 50 MG tablet TAKE ONE TABLET BY MOUTH ONCE DAILY 90 tablet 3  . dicyclomine (BENTYL) 10 MG capsule Take 10 mg by mouth 3 (three) times daily before meals.  6  . finasteride (PROSCAR) 5 MG tablet Take 5  mg by mouth daily.      . Lactase (LACTAID PO) Take by mouth as needed. Patient will chew tablets when he eats dairy products.    . nitroGLYCERIN (NITROSTAT) 0.4 MG SL tablet Place 1 tablet (0.4 mg total) under the tongue every 5 (five) minutes as needed for chest pain. 25 tablet 3  . Probiotic Product (PROBIOTIC DAILY PO) Take by mouth daily. Ultra Flora    . simvastatin (ZOCOR) 10 MG tablet Take 10 mg by mouth every evening.    . Tamsulosin HCl (FLOMAX) 0.4 MG CAPS Take 0.4 mg by mouth daily.      . TESTOSTERONE IM Inject 1 mL into the muscle every 14 (fourteen) days.     Marland Kitchen warfarin (COUMADIN) 3 MG tablet Take 1 1/2 tablets daily except 1 tablet on Mondays and Fridays (Patient taking differently: Take 1 1/2 tablets daily except 1 tablet on Mondays and Fridays MANAGED BY LISA) 45 tablet 6   No current facility-administered medications for this visit.   Allergies:  Review of patient's allergies indicates no known allergies.   Social History: The patient  reports that he quit smoking about 52 years ago. His smoking use included Cigarettes. He has a 50 pack-year smoking history. He has never used smokeless tobacco. He reports that he does not drink alcohol or use illicit drugs.   ROS:  Please see the history of present illness.  Otherwise, complete review of systems is positive for diminished hearing.  All other systems are reviewed and negative.   Physical Exam: VS:  BP 123/74 mmHg  Pulse 66  Ht 6' (1.829 m)  Wt 187 lb 9.6 oz (85.095 kg)  BMI 25.44 kg/m2  SpO2 97%, BMI Body mass index is 25.44 kg/(m^2).  Wt Readings from Last 3 Encounters:  07/28/15 187 lb 9.6 oz (85.095 kg)  01/22/15 189 lb (85.73 kg)  12/08/14 188 lb 9.6 oz (85.548 kg)    Patient appears comfortable at rest.  HEENT: Conjunctiva and lids normal, oropharynx clear.  Neck: Supple, no elevated JVP or carotid bruits, no thyromegaly.  Lungs: Clear to auscultation, nonlabored breathing at rest.  Cardiac: Irregularly  irregular, no S3, 2/6 apical systolic murmur, no pericardial rub.  Abdomen: Soft, nontender, bowel sounds present, no guarding or rebound.  Extremities: Trace edema, distal pulses 2+.   ECG: Tracing from 01/22/2015 showed rate-controlled atrial fibrillation.  Other Studies Reviewed Today:  1. Lexiscan Cardiolite from April 2015 showed no diagnostic ST segment abnormalities, perfusion evidence of mild ischemia in the RCA distribution, LVEF 59%.   2. Echocardiogram 02/07/2013: Study Conclusions  - Left ventricle: The cavity size was normal. There was mild concentric hypertrophy. Systolic function was normal. The estimated ejection fraction was in the range of 55% to 60%. Wall motion was normal; there were no regional wall motion abnormalities. The study is not technically sufficient to allow evaluation of LV diastolic function. - Aortic valve: Mildly calcified annulus. Trileaflet; mildly calcified leaflets. Left coronary cusp mobility was mildly restricted. Mild to moderate regurgitation. Mean gradient: 64mm Hg (S). - Mitral valve: Calcified annulus. Mildly thickened leaflets . Mild to moderate regurgitation. - Left atrium: The atrium was moderately dilated. - Right ventricle: The cavity size was mildly dilated. - Right atrium: The atrium was moderately dilated. Central venous pressure: 71mm Hg (est). - Tricuspid valve: Moderate regurgitation. - Pulmonary arteries: Systolic pressure was mildly increased. PA peak pressure: 65mm Hg (S). PA pressure: 57mm Hg (ED). - Pericardium, extracardiac: There was no pericardial effusion. Impressions:  - Unable to compare directly with previous study from June 2013. There is mild LVH with LVEF 55-60%. Indeterminate diastolic function in the setting of atrial fibrillation. Mildly thickened mitral valve with mild to moderate mitral regurgitation. Moderate biatrial enlargement noted. Sclerotic aortic valve with  mild to moderate aortic regurgitation. Moderate tricuspid regurgitation with PASP 41 mmHg based on estimated CVP.  Assessment and Plan:  1. Symptomatically stable CAD status post BMS to the RCA and LAD in 1995. Cardiolite study from April 2015 showed mild ischemia in the RCA distribution with normal LVEF. At this time he continues to do well without any progressive symptoms on medical therapy, and we will continue with observation for now.  2. Chronic atrial fibrillation, continue Coumadin for stroke prophylaxis and atenolol for heart rate control.  3. Hyperlipidemia, continues on low-dose Zocor.  Current medicines were reviewed with the patient today.  Disposition: FU with men 6 months.   Signed, Satira Sark, MD, [facility] 07/28/2015 3:42 PM    [place] [facility] [place], [place], [place] [address] Phone: [phone]; Fax: [phone]

## 2015-07-28 NOTE — Patient Instructions (Signed)
Your physician recommends that you continue on your current medications as directed. Please refer to the Current Medication list given to you today. Your physician recommends that you schedule a follow-up appointment in: 6 months. You will receive a reminder letter in the mail in about 4 months reminding you to call and schedule your appointment. If you don't receive this letter, please contact our office. 

## 2015-08-24 ENCOUNTER — Other Ambulatory Visit: Payer: Self-pay | Admitting: Cardiology

## 2015-09-04 DIAGNOSIS — R739 Hyperglycemia, unspecified: Secondary | ICD-10-CM | POA: Diagnosis not present

## 2015-09-04 DIAGNOSIS — E782 Mixed hyperlipidemia: Secondary | ICD-10-CM | POA: Diagnosis not present

## 2015-09-04 DIAGNOSIS — N183 Chronic kidney disease, stage 3 (moderate): Secondary | ICD-10-CM | POA: Diagnosis not present

## 2015-09-04 DIAGNOSIS — I10 Essential (primary) hypertension: Secondary | ICD-10-CM | POA: Diagnosis not present

## 2015-09-08 ENCOUNTER — Ambulatory Visit (INDEPENDENT_AMBULATORY_CARE_PROVIDER_SITE_OTHER): Payer: Medicare Other | Admitting: *Deleted

## 2015-09-08 DIAGNOSIS — I482 Chronic atrial fibrillation: Secondary | ICD-10-CM

## 2015-09-08 DIAGNOSIS — Z5181 Encounter for therapeutic drug level monitoring: Secondary | ICD-10-CM | POA: Diagnosis not present

## 2015-09-08 DIAGNOSIS — I4821 Permanent atrial fibrillation: Secondary | ICD-10-CM

## 2015-09-08 LAB — POCT INR: INR: 2.3

## 2015-09-11 DIAGNOSIS — I10 Essential (primary) hypertension: Secondary | ICD-10-CM | POA: Diagnosis not present

## 2015-09-11 DIAGNOSIS — N183 Chronic kidney disease, stage 3 (moderate): Secondary | ICD-10-CM | POA: Diagnosis not present

## 2015-09-11 DIAGNOSIS — E782 Mixed hyperlipidemia: Secondary | ICD-10-CM | POA: Diagnosis not present

## 2015-09-11 DIAGNOSIS — Z1389 Encounter for screening for other disorder: Secondary | ICD-10-CM | POA: Diagnosis not present

## 2015-09-11 DIAGNOSIS — G3184 Mild cognitive impairment, so stated: Secondary | ICD-10-CM | POA: Diagnosis not present

## 2015-09-11 DIAGNOSIS — I482 Chronic atrial fibrillation: Secondary | ICD-10-CM | POA: Diagnosis not present

## 2015-09-11 DIAGNOSIS — F5101 Primary insomnia: Secondary | ICD-10-CM | POA: Diagnosis not present

## 2015-10-05 ENCOUNTER — Other Ambulatory Visit: Payer: Self-pay | Admitting: Cardiology

## 2015-10-23 ENCOUNTER — Ambulatory Visit (INDEPENDENT_AMBULATORY_CARE_PROVIDER_SITE_OTHER): Payer: Medicare Other | Admitting: *Deleted

## 2015-10-23 DIAGNOSIS — Z5181 Encounter for therapeutic drug level monitoring: Secondary | ICD-10-CM

## 2015-10-23 DIAGNOSIS — I4821 Permanent atrial fibrillation: Secondary | ICD-10-CM

## 2015-10-23 DIAGNOSIS — I482 Chronic atrial fibrillation: Secondary | ICD-10-CM | POA: Diagnosis not present

## 2015-10-23 LAB — POCT INR: INR: 2.3

## 2015-12-03 ENCOUNTER — Ambulatory Visit (INDEPENDENT_AMBULATORY_CARE_PROVIDER_SITE_OTHER): Payer: Medicare Other | Admitting: *Deleted

## 2015-12-03 DIAGNOSIS — I482 Chronic atrial fibrillation: Secondary | ICD-10-CM | POA: Diagnosis not present

## 2015-12-03 DIAGNOSIS — I4821 Permanent atrial fibrillation: Secondary | ICD-10-CM

## 2015-12-03 DIAGNOSIS — Z5181 Encounter for therapeutic drug level monitoring: Secondary | ICD-10-CM | POA: Diagnosis not present

## 2015-12-03 LAB — POCT INR: INR: 2.4

## 2015-12-21 DIAGNOSIS — J209 Acute bronchitis, unspecified: Secondary | ICD-10-CM | POA: Diagnosis not present

## 2015-12-21 DIAGNOSIS — J0101 Acute recurrent maxillary sinusitis: Secondary | ICD-10-CM | POA: Diagnosis not present

## 2015-12-24 DIAGNOSIS — J0101 Acute recurrent maxillary sinusitis: Secondary | ICD-10-CM | POA: Diagnosis not present

## 2015-12-24 DIAGNOSIS — J209 Acute bronchitis, unspecified: Secondary | ICD-10-CM | POA: Diagnosis not present

## 2016-01-14 ENCOUNTER — Ambulatory Visit (INDEPENDENT_AMBULATORY_CARE_PROVIDER_SITE_OTHER): Payer: Medicare Other | Admitting: *Deleted

## 2016-01-14 DIAGNOSIS — I482 Chronic atrial fibrillation: Secondary | ICD-10-CM

## 2016-01-14 DIAGNOSIS — Z5181 Encounter for therapeutic drug level monitoring: Secondary | ICD-10-CM | POA: Diagnosis not present

## 2016-01-14 DIAGNOSIS — I4821 Permanent atrial fibrillation: Secondary | ICD-10-CM

## 2016-01-14 LAB — POCT INR: INR: 3.3

## 2016-01-21 ENCOUNTER — Ambulatory Visit (INDEPENDENT_AMBULATORY_CARE_PROVIDER_SITE_OTHER): Payer: Medicare Other | Admitting: Cardiology

## 2016-01-21 ENCOUNTER — Encounter: Payer: Self-pay | Admitting: Cardiology

## 2016-01-21 VITALS — BP 117/68 | HR 62 | Ht 72.0 in | Wt 181.8 lb

## 2016-01-21 DIAGNOSIS — I2510 Atherosclerotic heart disease of native coronary artery without angina pectoris: Secondary | ICD-10-CM | POA: Diagnosis not present

## 2016-01-21 DIAGNOSIS — I38 Endocarditis, valve unspecified: Secondary | ICD-10-CM | POA: Diagnosis not present

## 2016-01-21 DIAGNOSIS — I4821 Permanent atrial fibrillation: Secondary | ICD-10-CM

## 2016-01-21 DIAGNOSIS — I10 Essential (primary) hypertension: Secondary | ICD-10-CM

## 2016-01-21 DIAGNOSIS — I482 Chronic atrial fibrillation: Secondary | ICD-10-CM

## 2016-01-21 NOTE — Progress Notes (Signed)
Cardiology Office Note  Date: 01/21/2016   ID: Benjamin Mason, DOB 1930/11/04, MRN [id number]  PCP: Manon Hilding, MD  Primary Cardiologist: Rozann Lesches, MD   Chief Complaint  Patient presents with  . Atrial Fibrillation  . Coronary Artery Disease    History of Present Illness: Benjamin Mason is an 82 y.o. male last seen in December 2016. He presents for a routine follow-up visit. Continues to do well without any significant angina or recent nitroglycerin use. He does not experience palpitations with his atrial fibrillation. He and his wife recently got back from a trip to [place]. 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  He remains on Coumadin with follow-up in the anticoagulation clinic. No reported bleeding problems.  I reviewed his ECG today which shows rate-controlled atrial fibrillation.  Stress testing in 2015 is outlined below. We have been managing him medically, he has not had any significant angina on the current regimen.  Past Medical History  Diagnosis Date  . Permanent atrial fibrillation ([place])   . Mixed hyperlipidemia   . Anxiety   . Coronary atherosclerosis of native coronary artery     BMS to RCA and LAD 1995  . Essential hypertension, benign   . IBS (irritable bowel syndrome)   . Mitral regurgitation     Moderate  . Aortic regurgitation     Mild to moderate  . Lactose intolerance     Past Surgical History  Procedure Laterality Date  . Left inguinal hernia    . Penile prosthesis implant    . Colonoscopy with esophagogastroduodenoscopy (egd)  08/03/2012    Procedure: COLONOSCOPY WITH ESOPHAGOGASTRODUODENOSCOPY (EGD);  Surgeon: Rogene Houston, MD;  [facility];  Service: Endoscopy;  Laterality: N/A;  215  . Balloon dilation  08/03/2012    Procedure: BALLOON DILATION;  Surgeon: Rogene Houston, MD;  [facility];  Service: Endoscopy;  Laterality: N/A;  Venia Minks dilation  08/03/2012      Procedure: Venia Minks DILATION;  Surgeon: Rogene Houston, MD;  [facility];  Service: Endoscopy;  Laterality: N/A;  . Savory dilation  08/03/2012    Procedure: SAVORY DILATION;  Surgeon: Rogene Houston, MD;  [facility];  Service: Endoscopy;  Laterality: N/A;    Current Outpatient Prescriptions  Medication Sig Dispense Refill  . acetaminophen (TYLENOL ARTHRITIS PAIN) 650 MG CR tablet Take 1,300 mg by mouth 2 (two) times daily.     Marland Kitchen amLODipine (NORVASC) 2.5 MG tablet TAKE ONE TABLET BY MOUTH DAILY 90 tablet 3  . aspirin 81 MG tablet Take 81 mg by mouth daily.    Marland Kitchen atenolol (TENORMIN) 50 MG tablet TAKE ONE TABLET BY MOUTH ONCE DAILY 90 tablet 0  . dicyclomine (BENTYL) 10 MG capsule Take 10 mg by mouth daily.   6  . doxycycline (VIBRA-TABS) 100 MG tablet Take 100 mg by mouth 2 (two) times daily.  0  . finasteride (PROSCAR) 5 MG tablet Take 5 mg by mouth daily.      . Lactase (LACTAID PO) Take by mouth as needed. Patient will chew tablets when he eats dairy products.    . nitroGLYCERIN (NITROSTAT) 0.4 MG SL tablet Place 1 tablet (0.4 mg total) under the tongue every 5 (five) minutes as needed for chest pain. 25 tablet 3  . Probiotic Product (PROBIOTIC DAILY PO) Take by mouth daily. Ultra Flora    . simvastatin (ZOCOR) 10 MG tablet Take 10  mg by mouth every evening.    . Tamsulosin HCl (FLOMAX) 0.4 MG CAPS Take 0.4 mg by mouth daily.      . TESTOSTERONE IM Inject 1 mL into the muscle every 14 (fourteen) days. Reported on 01/21/2016    . warfarin (COUMADIN) 3 MG tablet Take 1 1/2 tablets daily except 1 tablet on Mondays and Fridays (Patient taking differently: Take 1 1/2 tablets daily except 1 tablet on Mondays and Fridays MANAGED BY LISA) 45 tablet 6   No current facility-administered medications for this visit.   Allergies:  Review of patient's allergies indicates no known allergies.   Social History: The patient  reports that he quit smoking about 53 years ago. His  smoking use included Cigarettes. He has a 50 pack-year smoking history. He has never used smokeless tobacco. He reports that he does not drink alcohol or use illicit drugs.   ROS:  Please see the history of present illness. Otherwise, complete review of systems is positive for mild trouble with his memory.  All other systems are reviewed and negative.   Physical Exam: VS:  BP 117/68 mmHg  Pulse 62  Ht 6' (1.829 m)  Wt 181 lb 12.8 oz (82.464 kg)  BMI 24.65 kg/m2  SpO2 97%, BMI Body mass index is 24.65 kg/(m^2).  Wt Readings from Last 3 Encounters:  01/21/16 181 lb 12.8 oz (82.464 kg)  07/28/15 187 lb 9.6 oz (85.095 kg)  01/22/15 189 lb (85.73 kg)    Patient appears comfortable at rest.  HEENT: Conjunctiva and lids normal, oropharynx clear.  Neck: Supple, no elevated JVP or carotid bruits, no thyromegaly.  Lungs: Clear to auscultation, nonlabored breathing at rest.  Cardiac: Irregularly irregular, no S3, 2/6 apical systolic murmur, no pericardial rub.  Abdomen: Soft, nontender, bowel sounds present, no guarding or rebound.  Extremities: Trace edema, distal pulses 2+.   ECG: I personally reviewed the tracing from 01/22/2015 which showed rate-controlled atrial fibrillation with borderline low voltage.  Other Studies Reviewed Today:  Lexiscan Cardiolite April 2015: No diagnostic ST segment abnormalities, perfusion evidence of mild ischemia in the RCA distribution, LVEF 59%.   Echocardiogram 02/07/2013: Study Conclusions  - Left ventricle: The cavity size was normal. There was mild concentric hypertrophy. Systolic function was normal. The estimated ejection fraction was in the range of 55% to 60%. Wall motion was normal; there were no regional wall motion abnormalities. The study is not technically sufficient to allow evaluation of LV diastolic function. - Aortic valve: Mildly calcified annulus. Trileaflet; mildly calcified leaflets. Left coronary cusp mobility was  mildly restricted. Mild to moderate regurgitation. Mean gradient: 40mm Hg (S). - Mitral valve: Calcified annulus. Mildly thickened leaflets . Mild to moderate regurgitation. - Left atrium: The atrium was moderately dilated. - Right ventricle: The cavity size was mildly dilated. - Right atrium: The atrium was moderately dilated. Central venous pressure: 62mm Hg (est). - Tricuspid valve: Moderate regurgitation. - Pulmonary arteries: Systolic pressure was mildly increased. PA peak pressure: 59mm Hg (S). PA pressure: 35mm Hg (ED). - Pericardium, extracardiac: There was no pericardial effusion. Impressions:  - Unable to compare directly with previous study from June 2013. There is mild LVH with LVEF 55-60%. Indeterminate diastolic function in the setting of atrial fibrillation. Mildly thickened mitral valve with mild to moderate mitral regurgitation. Moderate biatrial enlargement noted. Sclerotic aortic valve with mild to moderate aortic regurgitation. Moderate tricuspid regurgitation with PASP 41 mmHg based on estimated CVP.  Assessment and Plan:  1. Chronic atrial fibrillation. Symptomatically  stable without palpitations. ECG reviewed. Continue Coumadin with follow-up in the anticoagulation clinic. He also remains on atenolol for heart rate control.  2. CAD status post BMS to the RCA and LAD in 1995. Stress testing from 2015 revealed mild inferior wall ischemia. We have discussed this and he remains stable on medical therapy. We will continue with observation for now unless symptoms escalate.  3. Valvular heart disease including mild to moderate mitral regurgitation and moderate aortic regurgitation. No significant change on examination.  4. Essential hypertension, blood pressure is normal today.  Current medicines were reviewed with the patient today.   Orders Placed This Encounter  Procedures  . EKG 12-Lead    Disposition: FU with me in 6  months.   Signed, Satira Sark, MD, [facility] 01/21/2016 1:29 PM    [place] [facility] [place], [place], [place] [address] Phone: [phone]; Fax: [phone]

## 2016-01-21 NOTE — Patient Instructions (Signed)
Your physician recommends that you continue on your current medications as directed. Please refer to the Current Medication list given to you today. Your physician recommends that you schedule a follow-up appointment in: 6 months. You will receive a reminder letter in the mail in about 4 months reminding you to call and schedule your appointment. If you don't receive this letter, please contact our office. 

## 2016-02-02 ENCOUNTER — Ambulatory Visit (INDEPENDENT_AMBULATORY_CARE_PROVIDER_SITE_OTHER): Payer: Medicare Other | Admitting: *Deleted

## 2016-02-02 DIAGNOSIS — I4821 Permanent atrial fibrillation: Secondary | ICD-10-CM

## 2016-02-02 DIAGNOSIS — Z5181 Encounter for therapeutic drug level monitoring: Secondary | ICD-10-CM | POA: Diagnosis not present

## 2016-02-02 DIAGNOSIS — I482 Chronic atrial fibrillation: Secondary | ICD-10-CM | POA: Diagnosis not present

## 2016-02-02 LAB — POCT INR: INR: 2.3

## 2016-02-15 ENCOUNTER — Other Ambulatory Visit: Payer: Self-pay | Admitting: Cardiology

## 2016-02-22 ENCOUNTER — Other Ambulatory Visit: Payer: Self-pay | Admitting: Cardiology

## 2016-03-01 ENCOUNTER — Ambulatory Visit (INDEPENDENT_AMBULATORY_CARE_PROVIDER_SITE_OTHER): Payer: Medicare Other | Admitting: *Deleted

## 2016-03-01 DIAGNOSIS — I482 Chronic atrial fibrillation: Secondary | ICD-10-CM | POA: Diagnosis not present

## 2016-03-01 DIAGNOSIS — Z5181 Encounter for therapeutic drug level monitoring: Secondary | ICD-10-CM | POA: Diagnosis not present

## 2016-03-01 DIAGNOSIS — I4821 Permanent atrial fibrillation: Secondary | ICD-10-CM

## 2016-03-01 LAB — POCT INR: INR: 3

## 2016-03-07 DIAGNOSIS — E7800 Pure hypercholesterolemia, unspecified: Secondary | ICD-10-CM | POA: Diagnosis not present

## 2016-03-07 DIAGNOSIS — Z13220 Encounter for screening for lipoid disorders: Secondary | ICD-10-CM | POA: Diagnosis not present

## 2016-03-07 DIAGNOSIS — E782 Mixed hyperlipidemia: Secondary | ICD-10-CM | POA: Diagnosis not present

## 2016-03-07 DIAGNOSIS — R739 Hyperglycemia, unspecified: Secondary | ICD-10-CM | POA: Diagnosis not present

## 2016-03-07 DIAGNOSIS — N183 Chronic kidney disease, stage 3 (moderate): Secondary | ICD-10-CM | POA: Diagnosis not present

## 2016-03-07 DIAGNOSIS — I10 Essential (primary) hypertension: Secondary | ICD-10-CM | POA: Diagnosis not present

## 2016-03-11 DIAGNOSIS — I10 Essential (primary) hypertension: Secondary | ICD-10-CM | POA: Diagnosis not present

## 2016-03-11 DIAGNOSIS — E782 Mixed hyperlipidemia: Secondary | ICD-10-CM | POA: Diagnosis not present

## 2016-03-11 DIAGNOSIS — I482 Chronic atrial fibrillation: Secondary | ICD-10-CM | POA: Diagnosis not present

## 2016-03-11 DIAGNOSIS — G3184 Mild cognitive impairment, so stated: Secondary | ICD-10-CM | POA: Diagnosis not present

## 2016-03-11 DIAGNOSIS — F5101 Primary insomnia: Secondary | ICD-10-CM | POA: Diagnosis not present

## 2016-03-11 DIAGNOSIS — Z6825 Body mass index (BMI) 25.0-25.9, adult: Secondary | ICD-10-CM | POA: Diagnosis not present

## 2016-03-11 DIAGNOSIS — N183 Chronic kidney disease, stage 3 (moderate): Secondary | ICD-10-CM | POA: Diagnosis not present

## 2016-03-14 DIAGNOSIS — Z6825 Body mass index (BMI) 25.0-25.9, adult: Secondary | ICD-10-CM | POA: Diagnosis not present

## 2016-03-14 DIAGNOSIS — M25561 Pain in right knee: Secondary | ICD-10-CM | POA: Diagnosis not present

## 2016-03-21 DIAGNOSIS — M9903 Segmental and somatic dysfunction of lumbar region: Secondary | ICD-10-CM | POA: Diagnosis not present

## 2016-03-21 DIAGNOSIS — S338XXA Sprain of other parts of lumbar spine and pelvis, initial encounter: Secondary | ICD-10-CM | POA: Diagnosis not present

## 2016-03-22 DIAGNOSIS — Z961 Presence of intraocular lens: Secondary | ICD-10-CM | POA: Diagnosis not present

## 2016-03-22 DIAGNOSIS — H353131 Nonexudative age-related macular degeneration, bilateral, early dry stage: Secondary | ICD-10-CM | POA: Diagnosis not present

## 2016-03-24 ENCOUNTER — Ambulatory Visit (INDEPENDENT_AMBULATORY_CARE_PROVIDER_SITE_OTHER): Payer: Medicare Other | Admitting: *Deleted

## 2016-03-24 DIAGNOSIS — I4821 Permanent atrial fibrillation: Secondary | ICD-10-CM

## 2016-03-24 DIAGNOSIS — Z5181 Encounter for therapeutic drug level monitoring: Secondary | ICD-10-CM | POA: Diagnosis not present

## 2016-03-24 DIAGNOSIS — I482 Chronic atrial fibrillation: Secondary | ICD-10-CM

## 2016-03-24 DIAGNOSIS — S338XXA Sprain of other parts of lumbar spine and pelvis, initial encounter: Secondary | ICD-10-CM | POA: Diagnosis not present

## 2016-03-24 DIAGNOSIS — M9903 Segmental and somatic dysfunction of lumbar region: Secondary | ICD-10-CM | POA: Diagnosis not present

## 2016-03-24 LAB — POCT INR: INR: 2.6

## 2016-03-28 DIAGNOSIS — S338XXA Sprain of other parts of lumbar spine and pelvis, initial encounter: Secondary | ICD-10-CM | POA: Diagnosis not present

## 2016-03-28 DIAGNOSIS — M25579 Pain in unspecified ankle and joints of unspecified foot: Secondary | ICD-10-CM | POA: Diagnosis not present

## 2016-03-28 DIAGNOSIS — M79672 Pain in left foot: Secondary | ICD-10-CM | POA: Diagnosis not present

## 2016-03-28 DIAGNOSIS — M9903 Segmental and somatic dysfunction of lumbar region: Secondary | ICD-10-CM | POA: Diagnosis not present

## 2016-03-28 DIAGNOSIS — M79671 Pain in right foot: Secondary | ICD-10-CM | POA: Diagnosis not present

## 2016-03-28 DIAGNOSIS — Q828 Other specified congenital malformations of skin: Secondary | ICD-10-CM | POA: Diagnosis not present

## 2016-03-30 DIAGNOSIS — I4891 Unspecified atrial fibrillation: Secondary | ICD-10-CM | POA: Diagnosis not present

## 2016-03-30 DIAGNOSIS — N400 Enlarged prostate without lower urinary tract symptoms: Secondary | ICD-10-CM | POA: Diagnosis not present

## 2016-03-30 DIAGNOSIS — E876 Hypokalemia: Secondary | ICD-10-CM | POA: Diagnosis not present

## 2016-03-30 DIAGNOSIS — N179 Acute kidney failure, unspecified: Secondary | ICD-10-CM | POA: Diagnosis not present

## 2016-03-30 DIAGNOSIS — Z7901 Long term (current) use of anticoagulants: Secondary | ICD-10-CM | POA: Diagnosis not present

## 2016-03-30 DIAGNOSIS — I482 Chronic atrial fibrillation: Secondary | ICD-10-CM | POA: Diagnosis present

## 2016-03-30 DIAGNOSIS — M1990 Unspecified osteoarthritis, unspecified site: Secondary | ICD-10-CM | POA: Diagnosis present

## 2016-03-30 DIAGNOSIS — Z8673 Personal history of transient ischemic attack (TIA), and cerebral infarction without residual deficits: Secondary | ICD-10-CM | POA: Diagnosis not present

## 2016-03-30 DIAGNOSIS — R51 Headache: Secondary | ICD-10-CM | POA: Diagnosis not present

## 2016-03-30 DIAGNOSIS — I2510 Atherosclerotic heart disease of native coronary artery without angina pectoris: Secondary | ICD-10-CM | POA: Diagnosis not present

## 2016-03-30 DIAGNOSIS — H547 Unspecified visual loss: Secondary | ICD-10-CM | POA: Diagnosis not present

## 2016-03-30 DIAGNOSIS — Z87891 Personal history of nicotine dependence: Secondary | ICD-10-CM | POA: Diagnosis not present

## 2016-03-30 DIAGNOSIS — H349 Unspecified retinal vascular occlusion: Secondary | ICD-10-CM | POA: Diagnosis not present

## 2016-03-30 DIAGNOSIS — I129 Hypertensive chronic kidney disease with stage 1 through stage 4 chronic kidney disease, or unspecified chronic kidney disease: Secondary | ICD-10-CM | POA: Diagnosis present

## 2016-03-30 DIAGNOSIS — E875 Hyperkalemia: Secondary | ICD-10-CM | POA: Diagnosis not present

## 2016-03-30 DIAGNOSIS — Z79899 Other long term (current) drug therapy: Secondary | ICD-10-CM | POA: Diagnosis not present

## 2016-03-30 DIAGNOSIS — N183 Chronic kidney disease, stage 3 (moderate): Secondary | ICD-10-CM | POA: Diagnosis not present

## 2016-04-01 DIAGNOSIS — T8522XA Displacement of intraocular lens, initial encounter: Secondary | ICD-10-CM | POA: Diagnosis not present

## 2016-04-01 DIAGNOSIS — H2101 Hyphema, right eye: Secondary | ICD-10-CM | POA: Diagnosis not present

## 2016-04-01 DIAGNOSIS — H353132 Nonexudative age-related macular degeneration, bilateral, intermediate dry stage: Secondary | ICD-10-CM | POA: Diagnosis not present

## 2016-04-01 DIAGNOSIS — H43813 Vitreous degeneration, bilateral: Secondary | ICD-10-CM | POA: Diagnosis not present

## 2016-04-01 DIAGNOSIS — H4311 Vitreous hemorrhage, right eye: Secondary | ICD-10-CM | POA: Diagnosis not present

## 2016-04-01 DIAGNOSIS — H35372 Puckering of macula, left eye: Secondary | ICD-10-CM | POA: Diagnosis not present

## 2016-04-05 DIAGNOSIS — I482 Chronic atrial fibrillation: Secondary | ICD-10-CM | POA: Diagnosis not present

## 2016-04-05 DIAGNOSIS — N183 Chronic kidney disease, stage 3 (moderate): Secondary | ICD-10-CM | POA: Diagnosis not present

## 2016-04-05 DIAGNOSIS — E782 Mixed hyperlipidemia: Secondary | ICD-10-CM | POA: Diagnosis not present

## 2016-04-05 DIAGNOSIS — Z6825 Body mass index (BMI) 25.0-25.9, adult: Secondary | ICD-10-CM | POA: Diagnosis not present

## 2016-04-05 DIAGNOSIS — I10 Essential (primary) hypertension: Secondary | ICD-10-CM | POA: Diagnosis not present

## 2016-04-05 DIAGNOSIS — E875 Hyperkalemia: Secondary | ICD-10-CM | POA: Diagnosis not present

## 2016-04-12 DIAGNOSIS — S338XXA Sprain of other parts of lumbar spine and pelvis, initial encounter: Secondary | ICD-10-CM | POA: Diagnosis not present

## 2016-04-12 DIAGNOSIS — M9903 Segmental and somatic dysfunction of lumbar region: Secondary | ICD-10-CM | POA: Diagnosis not present

## 2016-04-19 ENCOUNTER — Other Ambulatory Visit: Payer: Self-pay

## 2016-04-19 DIAGNOSIS — H43813 Vitreous degeneration, bilateral: Secondary | ICD-10-CM | POA: Diagnosis not present

## 2016-04-19 DIAGNOSIS — H353132 Nonexudative age-related macular degeneration, bilateral, intermediate dry stage: Secondary | ICD-10-CM | POA: Diagnosis not present

## 2016-04-19 DIAGNOSIS — H35372 Puckering of macula, left eye: Secondary | ICD-10-CM | POA: Diagnosis not present

## 2016-04-19 DIAGNOSIS — T8522XA Displacement of intraocular lens, initial encounter: Secondary | ICD-10-CM | POA: Diagnosis not present

## 2016-04-21 ENCOUNTER — Ambulatory Visit (INDEPENDENT_AMBULATORY_CARE_PROVIDER_SITE_OTHER): Payer: Medicare Other | Admitting: *Deleted

## 2016-04-21 DIAGNOSIS — I482 Chronic atrial fibrillation: Secondary | ICD-10-CM

## 2016-04-21 DIAGNOSIS — Z5181 Encounter for therapeutic drug level monitoring: Secondary | ICD-10-CM

## 2016-04-21 DIAGNOSIS — M9903 Segmental and somatic dysfunction of lumbar region: Secondary | ICD-10-CM | POA: Diagnosis not present

## 2016-04-21 DIAGNOSIS — I4821 Permanent atrial fibrillation: Secondary | ICD-10-CM

## 2016-04-21 DIAGNOSIS — S338XXA Sprain of other parts of lumbar spine and pelvis, initial encounter: Secondary | ICD-10-CM | POA: Diagnosis not present

## 2016-04-21 LAB — POCT INR: INR: 2.7

## 2016-04-26 ENCOUNTER — Encounter (INDEPENDENT_AMBULATORY_CARE_PROVIDER_SITE_OTHER): Payer: Self-pay | Admitting: Internal Medicine

## 2016-04-29 ENCOUNTER — Other Ambulatory Visit: Payer: Self-pay | Admitting: Cardiology

## 2016-04-30 DIAGNOSIS — I10 Essential (primary) hypertension: Secondary | ICD-10-CM | POA: Diagnosis not present

## 2016-04-30 DIAGNOSIS — E875 Hyperkalemia: Secondary | ICD-10-CM | POA: Diagnosis not present

## 2016-04-30 DIAGNOSIS — Z6825 Body mass index (BMI) 25.0-25.9, adult: Secondary | ICD-10-CM | POA: Diagnosis not present

## 2016-04-30 DIAGNOSIS — I482 Chronic atrial fibrillation: Secondary | ICD-10-CM | POA: Diagnosis not present

## 2016-04-30 DIAGNOSIS — N183 Chronic kidney disease, stage 3 (moderate): Secondary | ICD-10-CM | POA: Diagnosis not present

## 2016-04-30 DIAGNOSIS — E782 Mixed hyperlipidemia: Secondary | ICD-10-CM | POA: Diagnosis not present

## 2016-05-03 DIAGNOSIS — E291 Testicular hypofunction: Secondary | ICD-10-CM | POA: Diagnosis not present

## 2016-05-03 DIAGNOSIS — N401 Enlarged prostate with lower urinary tract symptoms: Secondary | ICD-10-CM | POA: Diagnosis not present

## 2016-05-03 DIAGNOSIS — R351 Nocturia: Secondary | ICD-10-CM | POA: Diagnosis not present

## 2016-05-16 DIAGNOSIS — N183 Chronic kidney disease, stage 3 (moderate): Secondary | ICD-10-CM | POA: Diagnosis not present

## 2016-05-16 DIAGNOSIS — Z79899 Other long term (current) drug therapy: Secondary | ICD-10-CM | POA: Diagnosis not present

## 2016-05-17 DIAGNOSIS — I4891 Unspecified atrial fibrillation: Secondary | ICD-10-CM | POA: Diagnosis not present

## 2016-05-17 DIAGNOSIS — N179 Acute kidney failure, unspecified: Secondary | ICD-10-CM | POA: Diagnosis not present

## 2016-05-17 DIAGNOSIS — N183 Chronic kidney disease, stage 3 (moderate): Secondary | ICD-10-CM | POA: Diagnosis not present

## 2016-05-17 DIAGNOSIS — I10 Essential (primary) hypertension: Secondary | ICD-10-CM | POA: Diagnosis not present

## 2016-05-19 ENCOUNTER — Ambulatory Visit (INDEPENDENT_AMBULATORY_CARE_PROVIDER_SITE_OTHER): Payer: Medicare Other | Admitting: *Deleted

## 2016-05-19 DIAGNOSIS — Z5181 Encounter for therapeutic drug level monitoring: Secondary | ICD-10-CM | POA: Diagnosis not present

## 2016-05-19 DIAGNOSIS — I482 Chronic atrial fibrillation: Secondary | ICD-10-CM | POA: Diagnosis not present

## 2016-05-19 DIAGNOSIS — I4821 Permanent atrial fibrillation: Secondary | ICD-10-CM

## 2016-05-19 LAB — POCT INR: INR: 3

## 2016-05-24 DIAGNOSIS — S0991XA Unspecified injury of ear, initial encounter: Secondary | ICD-10-CM | POA: Diagnosis not present

## 2016-05-24 DIAGNOSIS — Z6826 Body mass index (BMI) 26.0-26.9, adult: Secondary | ICD-10-CM | POA: Diagnosis not present

## 2016-06-02 ENCOUNTER — Ambulatory Visit (INDEPENDENT_AMBULATORY_CARE_PROVIDER_SITE_OTHER): Payer: Medicare Other | Admitting: *Deleted

## 2016-06-02 DIAGNOSIS — Z5181 Encounter for therapeutic drug level monitoring: Secondary | ICD-10-CM

## 2016-06-02 DIAGNOSIS — I482 Chronic atrial fibrillation: Secondary | ICD-10-CM

## 2016-06-02 DIAGNOSIS — I4821 Permanent atrial fibrillation: Secondary | ICD-10-CM

## 2016-06-02 LAB — POCT INR: INR: 1.7

## 2016-06-21 ENCOUNTER — Ambulatory Visit (INDEPENDENT_AMBULATORY_CARE_PROVIDER_SITE_OTHER): Payer: Medicare Other | Admitting: *Deleted

## 2016-06-21 DIAGNOSIS — I4821 Permanent atrial fibrillation: Secondary | ICD-10-CM

## 2016-06-21 DIAGNOSIS — T8522XA Displacement of intraocular lens, initial encounter: Secondary | ICD-10-CM | POA: Diagnosis not present

## 2016-06-21 DIAGNOSIS — I482 Chronic atrial fibrillation: Secondary | ICD-10-CM | POA: Diagnosis not present

## 2016-06-21 DIAGNOSIS — H353132 Nonexudative age-related macular degeneration, bilateral, intermediate dry stage: Secondary | ICD-10-CM | POA: Diagnosis not present

## 2016-06-21 DIAGNOSIS — Z5181 Encounter for therapeutic drug level monitoring: Secondary | ICD-10-CM | POA: Diagnosis not present

## 2016-06-21 DIAGNOSIS — H43813 Vitreous degeneration, bilateral: Secondary | ICD-10-CM | POA: Diagnosis not present

## 2016-06-21 DIAGNOSIS — H35372 Puckering of macula, left eye: Secondary | ICD-10-CM | POA: Diagnosis not present

## 2016-06-21 LAB — POCT INR: INR: 2.1

## 2016-07-19 ENCOUNTER — Ambulatory Visit (INDEPENDENT_AMBULATORY_CARE_PROVIDER_SITE_OTHER): Payer: Medicare Other | Admitting: *Deleted

## 2016-07-19 DIAGNOSIS — I482 Chronic atrial fibrillation: Secondary | ICD-10-CM | POA: Diagnosis not present

## 2016-07-19 DIAGNOSIS — Z5181 Encounter for therapeutic drug level monitoring: Secondary | ICD-10-CM | POA: Diagnosis not present

## 2016-07-19 DIAGNOSIS — I2510 Atherosclerotic heart disease of native coronary artery without angina pectoris: Secondary | ICD-10-CM

## 2016-07-19 DIAGNOSIS — I4821 Permanent atrial fibrillation: Secondary | ICD-10-CM

## 2016-07-19 LAB — POCT INR: INR: 1.9

## 2016-07-27 ENCOUNTER — Encounter: Payer: Self-pay | Admitting: *Deleted

## 2016-07-27 NOTE — Progress Notes (Deleted)
Cardiology Office Note  Date: 07/27/2016   ID: Benjamin Mason, DOB 08-23-1930, MRN [id number]  PCP: Manon Hilding, MD  Primary Cardiologist: Rozann Lesches, MD   No chief complaint on file.   History of Present Illness: Benjamin Mason is an 82 y.o. male last seen in June.  She continues to follow in the anticoagulation clinic on Coumadin.  Past Medical History:  Diagnosis Date  . Anxiety   . Aortic regurgitation    Mild to moderate  . Coronary atherosclerosis of native coronary artery    BMS to RCA and LAD 1995  . Essential hypertension, benign   . IBS (irritable bowel syndrome)   . Lactose intolerance   . Mitral regurgitation    Moderate  . Mixed hyperlipidemia   . Permanent atrial fibrillation [facility])     Past Surgical History:  Procedure Laterality Date  . BALLOON DILATION  08/03/2012   Procedure: BALLOON DILATION;  Surgeon: Rogene Houston, MD;  [facility];  Service: Endoscopy;  Laterality: N/A;  . COLONOSCOPY WITH ESOPHAGOGASTRODUODENOSCOPY (EGD)  08/03/2012   Procedure: COLONOSCOPY WITH ESOPHAGOGASTRODUODENOSCOPY (EGD);  Surgeon: Rogene Houston, MD;  [facility];  Service: Endoscopy;  Laterality: N/A;  215  . LEFT INGUINAL HERNIA    . MALONEY DILATION  08/03/2012   Procedure: MALONEY DILATION;  Surgeon: Rogene Houston, MD;  [facility];  Service: Endoscopy;  Laterality: N/A;  . PENILE PROSTHESIS IMPLANT    . SAVORY DILATION  08/03/2012   Procedure: SAVORY DILATION;  Surgeon: Rogene Houston, MD;  [facility];  Service: Endoscopy;  Laterality: N/A;    Current Outpatient Prescriptions  Medication Sig Dispense Refill  . acetaminophen (TYLENOL ARTHRITIS PAIN) 650 MG CR tablet Take 1,300 mg by mouth 2 (two) times daily.     Marland Kitchen amLODipine (NORVASC) 2.5 MG tablet TAKE ONE TABLET BY MOUTH DAILY 90 tablet 3  . aspirin 81 MG tablet Take 81 mg by mouth daily.    Marland Kitchen atenolol (TENORMIN) 50 MG tablet TAKE ONE  TABLET BY MOUTH ONCE DAILY 90 tablet 3  . dicyclomine (BENTYL) 10 MG capsule Take 10 mg by mouth daily.   6  . doxycycline (VIBRA-TABS) 100 MG tablet Take 100 mg by mouth 2 (two) times daily.  0  . finasteride (PROSCAR) 5 MG tablet Take 5 mg by mouth daily.      . Lactase (LACTAID PO) Take by mouth as needed. Patient will chew tablets when he eats dairy products.    . nitroGLYCERIN (NITROSTAT) 0.4 MG SL tablet Place 1 tablet (0.4 mg total) under the tongue every 5 (five) minutes as needed for chest pain. 25 tablet 3  . Probiotic Product (PROBIOTIC DAILY PO) Take by mouth daily. Ultra Flora    . simvastatin (ZOCOR) 10 MG tablet Take 10 mg by mouth every evening.    . Tamsulosin HCl (FLOMAX) 0.4 MG CAPS Take 0.4 mg by mouth daily.      . TESTOSTERONE IM Inject 1 mL into the muscle every 14 (fourteen) days. Reported on 01/21/2016    . warfarin (COUMADIN) 3 MG tablet TAKE AS DIRECTED BY COUMADIN CLINIC 135 tablet 0   No current facility-administered medications for this visit.    Allergies:  Patient has no known allergies.   Social History: The patient  reports that he quit smoking about 53 years ago. His smoking use included Cigarettes. He has a 50.00 pack-year smoking history. He has never used  smokeless tobacco. He reports that he does not drink alcohol or use drugs.   Family History: The patient's family history includes Heart attack in his father.   ROS:  Please see the history of present illness. Otherwise, complete review of systems is positive for {NONE DEFAULTED:18576::"none"}.  All other systems are reviewed and negative.   Physical Exam: VS:  There were no vitals taken for this visit., BMI There is no height or weight on file to calculate BMI.  Wt Readings from Last 3 Encounters:  01/21/16 181 lb 12.8 oz (82.5 kg)  07/28/15 187 lb 9.6 oz (85.1 kg)  01/22/15 189 lb (85.7 kg)    Patient appears comfortable at rest.  HEENT: Conjunctiva and lids normal, oropharynx clear.  Neck:  Supple, no elevated JVP or carotid bruits, no thyromegaly.  Lungs: Clear to auscultation, nonlabored breathing at rest.  Cardiac: Irregularly irregular, no S3, 2/6 apical systolic murmur, no pericardial rub.  Abdomen: Soft, nontender, bowel sounds present, no guarding or rebound.  Extremities: Trace edema, distal pulses 2+.   ECG: I personally reviewed the tracing from 01/21/2016 which showed rate-controlled atrial fibrillation.  Recent Labwork:  Lexiscan Cardiolite April 2015: No diagnostic ST segment abnormalities, perfusion evidence of mild ischemia in the RCA distribution, LVEF 59%.   Echocardiogram 02/07/2013: Study Conclusions  - Left ventricle: The cavity size was normal. There was mild concentric hypertrophy. Systolic function was normal. The estimated ejection fraction was in the range of 55% to 60%. Wall motion was normal; there were no regional wall motion abnormalities. The study is not technically sufficient to allow evaluation of LV diastolic function. - Aortic valve: Mildly calcified annulus. Trileaflet; mildly calcified leaflets. Left coronary cusp mobility was mildly restricted. Mild to moderate regurgitation. Mean gradient: 64mm Hg (S). - Mitral valve: Calcified annulus. Mildly thickened leaflets . Mild to moderate regurgitation. - Left atrium: The atrium was moderately dilated. - Right ventricle: The cavity size was mildly dilated. - Right atrium: The atrium was moderately dilated. Central venous pressure: 48mm Hg (est). - Tricuspid valve: Moderate regurgitation. - Pulmonary arteries: Systolic pressure was mildly increased. PA peak pressure: 49mm Hg (S). PA pressure: 88mm Hg (ED). - Pericardium, extracardiac: There was no pericardial effusion. Impressions:  - Unable to compare directly with previous study from June 2013. There is mild LVH with LVEF 55-60%. Indeterminate diastolic function in the setting of atrial  fibrillation. Mildly thickened mitral valve with mild to moderate mitral regurgitation. Moderate biatrial enlargement noted. Sclerotic aortic valve with mild to moderate aortic regurgitation. Moderate tricuspid regurgitation with PASP 41 mmHg based on estimated CVP.  Other Studies Reviewed Today:   Assessment and Plan:    Current medicines were reviewed with the patient today.  No orders of the defined types were placed in this encounter.   Disposition:  Signed, Satira Sark, MD, [facility] 07/27/2016 11:25 AM    [facility] at [place], [place], [place] [address] Phone: [phone]; Fax: [phone]

## 2016-07-28 ENCOUNTER — Encounter: Payer: Self-pay | Admitting: Cardiology

## 2016-07-28 ENCOUNTER — Ambulatory Visit: Payer: Medicare Other | Admitting: Cardiology

## 2016-08-01 ENCOUNTER — Other Ambulatory Visit: Payer: Self-pay | Admitting: Cardiology

## 2016-08-11 ENCOUNTER — Ambulatory Visit (INDEPENDENT_AMBULATORY_CARE_PROVIDER_SITE_OTHER): Payer: Medicare Other | Admitting: *Deleted

## 2016-08-11 DIAGNOSIS — Z5181 Encounter for therapeutic drug level monitoring: Secondary | ICD-10-CM

## 2016-08-11 DIAGNOSIS — I482 Chronic atrial fibrillation: Secondary | ICD-10-CM | POA: Diagnosis not present

## 2016-08-11 DIAGNOSIS — I4821 Permanent atrial fibrillation: Secondary | ICD-10-CM

## 2016-08-11 DIAGNOSIS — I2510 Atherosclerotic heart disease of native coronary artery without angina pectoris: Secondary | ICD-10-CM

## 2016-08-11 LAB — POCT INR: INR: 2.1

## 2016-08-25 DIAGNOSIS — Z79899 Other long term (current) drug therapy: Secondary | ICD-10-CM | POA: Diagnosis not present

## 2016-08-25 DIAGNOSIS — N183 Chronic kidney disease, stage 3 (moderate): Secondary | ICD-10-CM | POA: Diagnosis not present

## 2016-08-25 DIAGNOSIS — D649 Anemia, unspecified: Secondary | ICD-10-CM | POA: Diagnosis not present

## 2016-08-25 DIAGNOSIS — I129 Hypertensive chronic kidney disease with stage 1 through stage 4 chronic kidney disease, or unspecified chronic kidney disease: Secondary | ICD-10-CM | POA: Diagnosis not present

## 2016-08-30 DIAGNOSIS — N183 Chronic kidney disease, stage 3 (moderate): Secondary | ICD-10-CM | POA: Diagnosis not present

## 2016-08-30 DIAGNOSIS — E875 Hyperkalemia: Secondary | ICD-10-CM | POA: Diagnosis not present

## 2016-08-30 DIAGNOSIS — D649 Anemia, unspecified: Secondary | ICD-10-CM | POA: Diagnosis not present

## 2016-08-30 DIAGNOSIS — I10 Essential (primary) hypertension: Secondary | ICD-10-CM | POA: Diagnosis not present

## 2016-09-08 ENCOUNTER — Ambulatory Visit (INDEPENDENT_AMBULATORY_CARE_PROVIDER_SITE_OTHER): Payer: Medicare Other | Admitting: *Deleted

## 2016-09-08 DIAGNOSIS — I4821 Permanent atrial fibrillation: Secondary | ICD-10-CM

## 2016-09-08 DIAGNOSIS — I482 Chronic atrial fibrillation: Secondary | ICD-10-CM

## 2016-09-08 DIAGNOSIS — Z5181 Encounter for therapeutic drug level monitoring: Secondary | ICD-10-CM | POA: Diagnosis not present

## 2016-09-08 LAB — POCT INR: INR: 2

## 2016-09-14 DIAGNOSIS — E782 Mixed hyperlipidemia: Secondary | ICD-10-CM | POA: Diagnosis not present

## 2016-09-14 DIAGNOSIS — I10 Essential (primary) hypertension: Secondary | ICD-10-CM | POA: Diagnosis not present

## 2016-09-16 DIAGNOSIS — I10 Essential (primary) hypertension: Secondary | ICD-10-CM | POA: Diagnosis not present

## 2016-09-16 DIAGNOSIS — F5101 Primary insomnia: Secondary | ICD-10-CM | POA: Diagnosis not present

## 2016-09-16 DIAGNOSIS — G3184 Mild cognitive impairment, so stated: Secondary | ICD-10-CM | POA: Diagnosis not present

## 2016-09-16 DIAGNOSIS — I482 Chronic atrial fibrillation: Secondary | ICD-10-CM | POA: Diagnosis not present

## 2016-09-16 DIAGNOSIS — N183 Chronic kidney disease, stage 3 (moderate): Secondary | ICD-10-CM | POA: Diagnosis not present

## 2016-09-16 DIAGNOSIS — Z6825 Body mass index (BMI) 25.0-25.9, adult: Secondary | ICD-10-CM | POA: Diagnosis not present

## 2016-09-16 DIAGNOSIS — Z1389 Encounter for screening for other disorder: Secondary | ICD-10-CM | POA: Diagnosis not present

## 2016-09-16 DIAGNOSIS — E782 Mixed hyperlipidemia: Secondary | ICD-10-CM | POA: Diagnosis not present

## 2016-09-21 DIAGNOSIS — H43813 Vitreous degeneration, bilateral: Secondary | ICD-10-CM | POA: Diagnosis not present

## 2016-09-21 DIAGNOSIS — H35372 Puckering of macula, left eye: Secondary | ICD-10-CM | POA: Diagnosis not present

## 2016-09-21 DIAGNOSIS — T8522XS Displacement of intraocular lens, sequela: Secondary | ICD-10-CM | POA: Diagnosis not present

## 2016-09-21 DIAGNOSIS — H353132 Nonexudative age-related macular degeneration, bilateral, intermediate dry stage: Secondary | ICD-10-CM | POA: Diagnosis not present

## 2016-09-21 NOTE — Progress Notes (Deleted)
Cardiology Office Note  Date: 09/21/2016   ID: ASKIA ZENK, DOB 1931-06-06, MRN [id number]  PCP: Manon Hilding, MD  Primary Cardiologist: Rozann Lesches, MD   No chief complaint on file.   History of Present Illness: Benjamin Mason is an 83 y.o. male last seen in June 2017.  He continues to follow in the anticoagulation clinic on Coumadin.  Past Medical History:  Diagnosis Date  . Anxiety   . Aortic regurgitation    Mild to moderate  . Coronary atherosclerosis of native coronary artery    BMS to RCA and LAD 1995  . Essential hypertension, benign   . IBS (irritable bowel syndrome)   . Lactose intolerance   . Mitral regurgitation    Moderate  . Mixed hyperlipidemia   . Permanent atrial fibrillation [facility])     Past Surgical History:  Procedure Laterality Date  . BALLOON DILATION  08/03/2012   Procedure: BALLOON DILATION;  Surgeon: Rogene Houston, MD;  [facility];  Service: Endoscopy;  Laterality: N/A;  . COLONOSCOPY WITH ESOPHAGOGASTRODUODENOSCOPY (EGD)  08/03/2012   Procedure: COLONOSCOPY WITH ESOPHAGOGASTRODUODENOSCOPY (EGD);  Surgeon: Rogene Houston, MD;  [facility];  Service: Endoscopy;  Laterality: N/A;  215  . LEFT INGUINAL HERNIA    . MALONEY DILATION  08/03/2012   Procedure: MALONEY DILATION;  Surgeon: Rogene Houston, MD;  [facility];  Service: Endoscopy;  Laterality: N/A;  . PENILE PROSTHESIS IMPLANT    . SAVORY DILATION  08/03/2012   Procedure: SAVORY DILATION;  Surgeon: Rogene Houston, MD;  [facility];  Service: Endoscopy;  Laterality: N/A;    Current Outpatient Prescriptions  Medication Sig Dispense Refill  . acetaminophen (TYLENOL ARTHRITIS PAIN) 650 MG CR tablet Take 1,300 mg by mouth 2 (two) times daily.     Marland Kitchen amLODipine (NORVASC) 2.5 MG tablet TAKE ONE TABLET BY MOUTH DAILY 90 tablet 3  . aspirin 81 MG tablet Take 81 mg by mouth daily.    Marland Kitchen atenolol (TENORMIN) 50 MG tablet TAKE ONE  TABLET BY MOUTH ONCE DAILY 90 tablet 3  . dicyclomine (BENTYL) 10 MG capsule Take 10 mg by mouth daily.   6  . doxycycline (VIBRA-TABS) 100 MG tablet Take 100 mg by mouth 2 (two) times daily.  0  . finasteride (PROSCAR) 5 MG tablet Take 5 mg by mouth daily.      . Lactase (LACTAID PO) Take by mouth as needed. Patient will chew tablets when he eats dairy products.    . nitroGLYCERIN (NITROSTAT) 0.4 MG SL tablet Place 1 tablet (0.4 mg total) under the tongue every 5 (five) minutes as needed for chest pain. 25 tablet 3  . Probiotic Product (PROBIOTIC DAILY PO) Take by mouth daily. Ultra Flora    . simvastatin (ZOCOR) 10 MG tablet Take 10 mg by mouth every evening.    . Tamsulosin HCl (FLOMAX) 0.4 MG CAPS Take 0.4 mg by mouth daily.      . TESTOSTERONE IM Inject 1 mL into the muscle every 14 (fourteen) days. Reported on 01/21/2016    . warfarin (COUMADIN) 3 MG tablet TAKE AS DIRECTED BY COUMADIN CLINIC 135 tablet 3   No current facility-administered medications for this visit.    Allergies:  Patient has no known allergies.   Social History: The patient  reports that he quit smoking about 54 years ago. His smoking use included Cigarettes. He has a 50.00 pack-year smoking history. He has never  used smokeless tobacco. He reports that he does not drink alcohol or use drugs.   Family History: The patient's family history includes Heart attack in his father.   ROS:  Please see the history of present illness. Otherwise, complete review of systems is positive for {NONE DEFAULTED:18576::"none"}.  All other systems are reviewed and negative.   Physical Exam: VS:  There were no vitals taken for this visit., BMI There is no height or weight on file to calculate BMI.  Wt Readings from Last 3 Encounters:  01/21/16 181 lb 12.8 oz (82.5 kg)  07/28/15 187 lb 9.6 oz (85.1 kg)  01/22/15 189 lb (85.7 kg)    Patient appears comfortable at rest.  HEENT: Conjunctiva and lids normal, oropharynx clear.  Neck:  Supple, no elevated JVP or carotid bruits, no thyromegaly.  Lungs: Clear to auscultation, nonlabored breathing at rest.  Cardiac: Irregularly irregular, no S3, 2/6 apical systolic murmur, no pericardial rub.  Abdomen: Soft, nontender, bowel sounds present, no guarding or rebound.  Extremities: Trace edema, distal pulses 2+.  ECG: I personally reviewed the tracing from 01/21/2016 which showed rate-controlled atrial fibrillation.  Recent Labwork:   Other Studies Reviewed Today:  Lexiscan Cardiolite April 2015: No diagnostic ST segment abnormalities, perfusion evidence of mild ischemia in the RCA distribution, LVEF 59%.   Echocardiogram 02/07/2013: Study Conclusions  - Left ventricle: The cavity size was normal. There was mild concentric hypertrophy. Systolic function was normal. The estimated ejection fraction was in the range of 55% to 60%. Wall motion was normal; there were no regional wall motion abnormalities. The study is not technically sufficient to allow evaluation of LV diastolic function. - Aortic valve: Mildly calcified annulus. Trileaflet; mildly calcified leaflets. Left coronary cusp mobility was mildly restricted. Mild to moderate regurgitation. Mean gradient: 103mm Hg (S). - Mitral valve: Calcified annulus. Mildly thickened leaflets . Mild to moderate regurgitation. - Left atrium: The atrium was moderately dilated. - Right ventricle: The cavity size was mildly dilated. - Right atrium: The atrium was moderately dilated. Central venous pressure: 36mm Hg (est). - Tricuspid valve: Moderate regurgitation. - Pulmonary arteries: Systolic pressure was mildly increased. PA peak pressure: 48mm Hg (S). PA pressure: 45mm Hg (ED). - Pericardium, extracardiac: There was no pericardial effusion. Impressions:  - Unable to compare directly with previous study from June 2013. There is mild LVH with LVEF 55-60%. Indeterminate diastolic function in  the setting of atrial fibrillation. Mildly thickened mitral valve with mild to moderate mitral regurgitation. Moderate biatrial enlargement noted. Sclerotic aortic valve with mild to moderate aortic regurgitation. Moderate tricuspid regurgitation with PASP 41 mmHg based on estimated CVP.  Assessment and Plan:    Current medicines were reviewed with the patient today.  No orders of the defined types were placed in this encounter.   Disposition:  Signed, Satira Sark, MD, [facility] 09/21/2016 10:46 AM    [facility] at [place], [place], [place] [address] Phone: [phone]; Fax: [phone]

## 2016-09-22 ENCOUNTER — Ambulatory Visit: Payer: Medicare Other | Admitting: Cardiology

## 2016-09-30 DIAGNOSIS — Z6826 Body mass index (BMI) 26.0-26.9, adult: Secondary | ICD-10-CM | POA: Diagnosis not present

## 2016-09-30 DIAGNOSIS — L821 Other seborrheic keratosis: Secondary | ICD-10-CM | POA: Diagnosis not present

## 2016-10-06 ENCOUNTER — Ambulatory Visit (INDEPENDENT_AMBULATORY_CARE_PROVIDER_SITE_OTHER): Payer: Medicare Other | Admitting: *Deleted

## 2016-10-06 DIAGNOSIS — I482 Chronic atrial fibrillation: Secondary | ICD-10-CM | POA: Diagnosis not present

## 2016-10-06 DIAGNOSIS — I4821 Permanent atrial fibrillation: Secondary | ICD-10-CM

## 2016-10-06 DIAGNOSIS — Z5181 Encounter for therapeutic drug level monitoring: Secondary | ICD-10-CM | POA: Diagnosis not present

## 2016-10-06 LAB — POCT INR: INR: 1.8

## 2016-10-24 ENCOUNTER — Encounter: Payer: Self-pay | Admitting: Cardiology

## 2016-10-24 ENCOUNTER — Ambulatory Visit (INDEPENDENT_AMBULATORY_CARE_PROVIDER_SITE_OTHER): Payer: Medicare Other | Admitting: Cardiology

## 2016-10-24 VITALS — BP 131/72 | HR 62 | Ht 72.0 in | Wt 184.0 lb

## 2016-10-24 DIAGNOSIS — I482 Chronic atrial fibrillation, unspecified: Secondary | ICD-10-CM

## 2016-10-24 DIAGNOSIS — I38 Endocarditis, valve unspecified: Secondary | ICD-10-CM

## 2016-10-24 DIAGNOSIS — I208 Other forms of angina pectoris: Secondary | ICD-10-CM

## 2016-10-24 DIAGNOSIS — I10 Essential (primary) hypertension: Secondary | ICD-10-CM | POA: Diagnosis not present

## 2016-10-24 DIAGNOSIS — I25119 Atherosclerotic heart disease of native coronary artery with unspecified angina pectoris: Secondary | ICD-10-CM | POA: Diagnosis not present

## 2016-10-24 MED ORDER — ISOSORBIDE MONONITRATE ER 30 MG PO TB24: ORAL_TABLET | ORAL | 1 refills | Status: DC

## 2016-10-24 NOTE — Patient Instructions (Signed)
Your physician recommends that you schedule a follow-up appointment in: [place]  Your physician has recommended you make the following change in your medication:   START IMDUR 30 MG DAILY IN THE EVENING   Thank you for choosing [facility]!!

## 2016-10-24 NOTE — Progress Notes (Signed)
Cardiology Office Note  Date: 10/24/2016   ID: Benjamin Mason, DOB 1930-12-14, MRN [id number]  PCP: Manon Hilding, MD  Primary Cardiologist: Rozann Lesches, MD   Chief Complaint  Patient presents with  . Coronary Artery Disease  . Atrial Fibrillation    History of Present Illness: Benjamin Mason is an 83 y.o. male last seen in June 2017. He presents today for a routine follow-up visit. States that he has noticed within the last few weeks exertional chest heaviness when he walks his dog in the morning. States that he walks at a brisk pace. Symptoms are consistent with angina. He has not used nitroglycerin.  He has a history of CAD status post BMS to the RCA and LAD in 1995. Follow-up ischemic testing in 2015 revealed mild inferior ischemia and he has preferred medical therapy and observation in the absence of progressive symptoms. We discussed these results again today, the possibility of a diagnostic cardiac catheterization as well. We also went over his medications which are stable and outlined below. Heart rate is well controlled. He did not tolerate higher doses of Norvasc previously due to leg swelling. We discussed adding Imdur as a possibility.  He continues to follow in the anticoagulation clinic on Coumadin. No reported bleeding episodes. No falls.  Past Medical History:  Diagnosis Date  . Anxiety   . Aortic regurgitation    Mild to moderate  . Coronary atherosclerosis of native coronary artery    BMS to RCA and LAD 1995  . Essential hypertension, benign   . IBS (irritable bowel syndrome)   . Lactose intolerance   . Mitral regurgitation    Moderate  . Mixed hyperlipidemia   . Permanent atrial fibrillation [facility])     Past Surgical History:  Procedure Laterality Date  . BALLOON DILATION  08/03/2012   Procedure: BALLOON DILATION;  Surgeon: Rogene Houston, MD;  [facility];  Service: Endoscopy;  Laterality: N/A;  . COLONOSCOPY WITH  ESOPHAGOGASTRODUODENOSCOPY (EGD)  08/03/2012   Procedure: COLONOSCOPY WITH ESOPHAGOGASTRODUODENOSCOPY (EGD);  Surgeon: Rogene Houston, MD;  [facility];  Service: Endoscopy;  Laterality: N/A;  215  . LEFT INGUINAL HERNIA    . MALONEY DILATION  08/03/2012   Procedure: MALONEY DILATION;  Surgeon: Rogene Houston, MD;  [facility];  Service: Endoscopy;  Laterality: N/A;  . PENILE PROSTHESIS IMPLANT    . SAVORY DILATION  08/03/2012   Procedure: SAVORY DILATION;  Surgeon: Rogene Houston, MD;  [facility];  Service: Endoscopy;  Laterality: N/A;    Current Outpatient Prescriptions  Medication Sig Dispense Refill  . acetaminophen (TYLENOL ARTHRITIS PAIN) 650 MG CR tablet Take 1,300 mg by mouth 2 (two) times daily.     Marland Kitchen amLODipine (NORVASC) 2.5 MG tablet TAKE ONE TABLET BY MOUTH DAILY 90 tablet 3  . aspirin 81 MG tablet Take 81 mg by mouth daily.    Marland Kitchen atenolol (TENORMIN) 50 MG tablet TAKE ONE TABLET BY MOUTH ONCE DAILY 90 tablet 3  . finasteride (PROSCAR) 5 MG tablet Take 5 mg by mouth daily.      . Lactase (LACTAID PO) Take by mouth as needed. Patient will chew tablets when he eats dairy products.    . nitroGLYCERIN (NITROSTAT) 0.4 MG SL tablet Place 1 tablet (0.4 mg total) under the tongue every 5 (five) minutes as needed for chest pain. 25 tablet 3  . Probiotic Product (PROBIOTIC DAILY PO) Take by mouth daily. Ultra  Flora    . simvastatin (ZOCOR) 10 MG tablet Take 10 mg by mouth every evening.    . Tamsulosin HCl (FLOMAX) 0.4 MG CAPS Take 0.4 mg by mouth daily.      . TESTOSTERONE IM Inject 1 mL into the muscle every 14 (fourteen) days. Reported on 01/21/2016    . warfarin (COUMADIN) 3 MG tablet TAKE AS DIRECTED BY COUMADIN CLINIC 135 tablet 3  . isosorbide mononitrate (IMDUR) 30 MG 24 hr tablet Take 1 tablet daily in the evening 30 tablet 1   No current facility-administered medications for this visit.    Allergies:  Patient has no known allergies.    Social History: The patient  reports that he quit smoking about 54 years ago. His smoking use included Cigarettes. He has a 50.00 pack-year smoking history. He has never used smokeless tobacco. He reports that he does not drink alcohol or use drugs.   ROS:  Please see the history of present illness. Otherwise, complete review of systems is positive for none.  All other systems are reviewed and negative.   Physical Exam: VS:  BP 131/72   Pulse 62   Ht 6' (1.829 m)   Wt 184 lb (83.5 kg)   BMI 24.95 kg/m , BMI Body mass index is 24.95 kg/m.  Wt Readings from Last 3 Encounters:  10/24/16 184 lb (83.5 kg)  01/21/16 181 lb 12.8 oz (82.5 kg)  07/28/15 187 lb 9.6 oz (85.1 kg)    Elderly male, appears comfortable at rest.  HEENT: Conjunctiva and lids normal, oropharynx clear.  Neck: Supple, no elevated JVP or carotid bruits, no thyromegaly.  Lungs: Clear to auscultation, nonlabored breathing at rest.  Cardiac: Irregularly irregular, no S3, 2/6 apical systolic murmur, no pericardial rub.  Abdomen: Soft, nontender, bowel sounds present, no guarding or rebound.  Extremities: Trace edema, distal pulses 2+.  Skin: Warm and dry. Musculoskeletal: No kyphosis. Neuropsychiatric: Alert and oriented 3, affect appropriate.  ECG: I personally reviewed the tracing from 01/21/2016 which showed rate-controlled atrial fibrillation.  Other Studies Reviewed Today:  Lexiscan Cardiolite April 2015: No diagnostic ST segment abnormalities, perfusion evidence of mild ischemia in the RCA distribution, LVEF 59%.   Echocardiogram 02/07/2013: Study Conclusions  - Left ventricle: The cavity size was normal. There was mild concentric hypertrophy. Systolic function was normal. The estimated ejection fraction was in the range of 55% to 60%. Wall motion was normal; there were no regional wall motion abnormalities. The study is not technically sufficient to allow evaluation of LV diastolic  function. - Aortic valve: Mildly calcified annulus. Trileaflet; mildly calcified leaflets. Left coronary cusp mobility was mildly restricted. Mild to moderate regurgitation. Mean gradient: 53mm Hg (S). - Mitral valve: Calcified annulus. Mildly thickened leaflets . Mild to moderate regurgitation. - Left atrium: The atrium was moderately dilated. - Right ventricle: The cavity size was mildly dilated. - Right atrium: The atrium was moderately dilated. Central venous pressure: 71mm Hg (est). - Tricuspid valve: Moderate regurgitation. - Pulmonary arteries: Systolic pressure was mildly increased. PA peak pressure: 82mm Hg (S). PA pressure: 15mm Hg (ED). - Pericardium, extracardiac: There was no pericardial effusion. Impressions:  - Unable to compare directly with previous study from June 2013. There is mild LVH with LVEF 55-60%. Indeterminate diastolic function in the setting of atrial fibrillation. Mildly thickened mitral valve with mild to moderate mitral regurgitation. Moderate biatrial enlargement noted. Sclerotic aortic valve with mild to moderate aortic regurgitation. Moderate tricuspid regurgitation with PASP 41 mmHg based on estimated  CVP.  Assessment and Plan:  1. Exertional angina in the setting of CAD status post BMS to the RCA and LAD in 1995. He had mild inferior distribution ischemia as of 2015. We have discussed possibility of medication adjustments versus diagnostic cardiac catheterization. He prefers trial of medication first. We will add Imdur 30 mg once in the evening. Follow-up visit arranged to reassess. Did discuss seeking more urgent medical attention if his symptoms suddenly escalate.  2. Chronic atrial fibrillation, no palpitations and heart rate well controlled on atenolol. He continues on Coumadin with follow-up in the anticoagulation clinic.  3. Valvular heart disease, mild to moderate mitral and aortic regurgitation by  echocardiography in 2014. No change on examination.  4. Essential hypertension, blood pressure is well controlled today.  Current medicines were reviewed with the patient today.  Disposition: Follow-up in 6 weeks.  Signed, Satira Sark, MD, [facility] 10/24/2016 8:43 AM    [facility] at [place], [place], [place] [address] Phone: [phone]; Fax: [phone]

## 2016-10-27 ENCOUNTER — Ambulatory Visit (INDEPENDENT_AMBULATORY_CARE_PROVIDER_SITE_OTHER): Payer: Medicare Other | Admitting: *Deleted

## 2016-10-27 DIAGNOSIS — I208 Other forms of angina pectoris: Secondary | ICD-10-CM

## 2016-10-27 DIAGNOSIS — I4821 Permanent atrial fibrillation: Secondary | ICD-10-CM

## 2016-10-27 DIAGNOSIS — Z5181 Encounter for therapeutic drug level monitoring: Secondary | ICD-10-CM | POA: Diagnosis not present

## 2016-10-27 DIAGNOSIS — I482 Chronic atrial fibrillation: Secondary | ICD-10-CM | POA: Diagnosis not present

## 2016-10-27 LAB — POCT INR: INR: 2.1

## 2016-11-10 ENCOUNTER — Telehealth: Payer: Self-pay | Admitting: *Deleted

## 2016-11-10 NOTE — Telephone Encounter (Signed)
Patient notified & agrees to doing heart cath.  He is okay to do any day next week.

## 2016-11-10 NOTE — Telephone Encounter (Signed)
Patient calling this morning with continued c/o chest pressure.  States the Imdur only seemed to work a few days in the beginning.  Did not go to play golf this morning.  Did take his dog for a short walk though.  Did notice some SOB during this walk that seems to be a little more than before.  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  Message fwd to provider for further advice.

## 2016-11-10 NOTE — Telephone Encounter (Signed)
Thank you for the update. We did discuss the symptoms at his recent office visit. I am concerned that he is still experiencing angina following medication adjustments, and in light of his stress test results from 2015 and known CAD, it might be best to consider proceeding to a cardiac catheterization. See what he would like to do.

## 2016-11-11 ENCOUNTER — Encounter: Payer: Self-pay | Admitting: *Deleted

## 2016-11-11 ENCOUNTER — Telehealth: Payer: Self-pay | Admitting: Cardiology

## 2016-11-11 ENCOUNTER — Other Ambulatory Visit: Payer: Self-pay | Admitting: Cardiology

## 2016-11-11 DIAGNOSIS — I200 Unstable angina: Secondary | ICD-10-CM

## 2016-11-11 NOTE — Telephone Encounter (Signed)
Pre-cert Verification for the following procedure   left heart cath  Scheduled for Tuesday, 11/15/16 at noon with Dr. Martinique.

## 2016-11-11 NOTE — Telephone Encounter (Signed)
Left heart cath scheduled for Friday, November 18, 2016 at 10:30 am with Dr. Martinique.  Patient needs to arrive to short stay at [facility] at 8:30 am.    Patient notified & instructions given:   >  NPO after midnight >  Hold Warfarin x 4 days prior (stop on Monday, 4/2) - no need for bridging per Dr. Domenic Polite. >  You may take all remaining medications.  >  No allergy to contrast.   >  No new medications given at this time.    [facility]) made aware for precert purposes.

## 2016-11-11 NOTE — Progress Notes (Signed)
Patient called back to office since recent visit on March 12 reporting progressive exertional angina symptoms and shortness of breath despite the addition of Imdur. We have discussed the possibility of proceeding to a diagnostic cardiac catheterization if his symptoms did not improve, and he is in agreement to proceed. We have arranged cardiac catheterization with Dr. Martinique on April 6 at 10:30 in the morning. He will hold Coumadin 4 days prior to the procedure and have lab work obtained as well. Orders have been completed.  Satira Sark, M.D., F.A.C.C.

## 2016-11-18 ENCOUNTER — Encounter (HOSPITAL_COMMUNITY)
Admission: RE | Disposition: A | Discharge status: Home / Self Care | Payer: Self-pay | Source: Ambulatory Visit | Attending: Cardiology

## 2016-11-18 ENCOUNTER — Encounter (HOSPITAL_COMMUNITY): Payer: Self-pay | Admitting: Cardiology

## 2016-11-18 ENCOUNTER — Ambulatory Visit (HOSPITAL_COMMUNITY)
Admission: RE | Admit: 2016-11-18 | Discharge: 2016-11-18 | Disposition: A | Discharge status: Home / Self Care | Payer: Medicare Other | Source: Ambulatory Visit | Attending: Cardiology | Admitting: Cardiology

## 2016-11-18 DIAGNOSIS — I25110 Atherosclerotic heart disease of native coronary artery with unstable angina pectoris: Secondary | ICD-10-CM

## 2016-11-18 DIAGNOSIS — Z7982 Long term (current) use of aspirin: Secondary | ICD-10-CM | POA: Insufficient documentation

## 2016-11-18 DIAGNOSIS — I482 Chronic atrial fibrillation: Secondary | ICD-10-CM | POA: Diagnosis not present

## 2016-11-18 DIAGNOSIS — E739 Lactose intolerance, unspecified: Secondary | ICD-10-CM | POA: Diagnosis not present

## 2016-11-18 DIAGNOSIS — N1831 Chronic kidney disease, stage 3a: Secondary | ICD-10-CM | POA: Insufficient documentation

## 2016-11-18 DIAGNOSIS — Z955 Presence of coronary angioplasty implant and graft: Secondary | ICD-10-CM | POA: Diagnosis not present

## 2016-11-18 DIAGNOSIS — N183 Chronic kidney disease, stage 3 unspecified: Secondary | ICD-10-CM | POA: Diagnosis present

## 2016-11-18 DIAGNOSIS — Z7901 Long term (current) use of anticoagulants: Secondary | ICD-10-CM | POA: Diagnosis not present

## 2016-11-18 DIAGNOSIS — I25119 Atherosclerotic heart disease of native coronary artery with unspecified angina pectoris: Secondary | ICD-10-CM | POA: Insufficient documentation

## 2016-11-18 DIAGNOSIS — Z87891 Personal history of nicotine dependence: Secondary | ICD-10-CM | POA: Insufficient documentation

## 2016-11-18 DIAGNOSIS — K589 Irritable bowel syndrome without diarrhea: Secondary | ICD-10-CM | POA: Insufficient documentation

## 2016-11-18 DIAGNOSIS — I10 Essential (primary) hypertension: Secondary | ICD-10-CM | POA: Insufficient documentation

## 2016-11-18 DIAGNOSIS — E782 Mixed hyperlipidemia: Secondary | ICD-10-CM | POA: Diagnosis present

## 2016-11-18 DIAGNOSIS — I083 Combined rheumatic disorders of mitral, aortic and tricuspid valves: Secondary | ICD-10-CM | POA: Insufficient documentation

## 2016-11-18 DIAGNOSIS — I200 Unstable angina: Secondary | ICD-10-CM | POA: Diagnosis present

## 2016-11-18 DIAGNOSIS — I209 Angina pectoris, unspecified: Secondary | ICD-10-CM | POA: Diagnosis present

## 2016-11-18 DIAGNOSIS — I4821 Permanent atrial fibrillation: Secondary | ICD-10-CM | POA: Diagnosis present

## 2016-11-18 DIAGNOSIS — F419 Anxiety disorder, unspecified: Secondary | ICD-10-CM | POA: Insufficient documentation

## 2016-11-18 HISTORY — DX: Chronic kidney disease, stage 3 (moderate): N18.3

## 2016-11-18 HISTORY — DX: Chronic kidney disease, stage 3 unspecified: N18.30

## 2016-11-18 HISTORY — PX: LEFT HEART CATH AND CORONARY ANGIOGRAPHY: CATH118249

## 2016-11-18 LAB — PROTIME-INR
INR: 1.22
Prothrombin Time: 15.5 seconds — ABNORMAL HIGH (ref 11.4–15.2)

## 2016-11-18 LAB — BASIC METABOLIC PANEL
ANION GAP: 7 (ref 5–15)
BUN: 39 mg/dL — ABNORMAL HIGH (ref 6–20)
CALCIUM: 9 mg/dL (ref 8.9–10.3)
CO2: 25 mmol/L (ref 22–32)
Chloride: 106 mmol/L (ref 101–111)
Creatinine, Ser: 1.92 mg/dL — ABNORMAL HIGH (ref 0.61–1.24)
GFR calc non Af Amer: 31 mL/min — ABNORMAL LOW (ref >60)
GFR, EST AFRICAN AMERICAN: 36 mL/min — AB (ref >60)
Glucose, Bld: 89 mg/dL (ref 65–99)
Potassium: 4.8 mmol/L (ref 3.5–5.1)
Sodium: 138 mmol/L (ref 135–145)

## 2016-11-18 LAB — CBC
HEMATOCRIT: 43.5 % (ref 39.0–52.0)
HEMOGLOBIN: 13.9 g/dL (ref 13.0–17.0)
MCH: 34.7 pg — AB (ref 26.0–34.0)
MCHC: 32 g/dL (ref 30.0–36.0)
MCV: 108.5 fL — ABNORMAL HIGH (ref 78.0–100.0)
Platelets: 154 10*3/uL (ref 150–400)
RBC: 4.01 MIL/uL — ABNORMAL LOW (ref 4.22–5.81)
RDW: 12.7 % (ref 11.5–15.5)
WBC: 6.4 10*3/uL (ref 4.0–10.5)

## 2016-11-18 SURGERY — LEFT HEART CATH AND CORONARY ANGIOGRAPHY
Anesthesia: LOCAL

## 2016-11-18 MED ORDER — SODIUM CHLORIDE 0.9 % IV SOLN
INTRAVENOUS | Status: DC
  Administered 2016-11-18: 10:00:00 via INTRAVENOUS

## 2016-11-18 MED ORDER — VERAPAMIL HCL 2.5 MG/ML IV SOLN
INTRAVENOUS | Status: DC | PRN
  Administered 2016-11-18: 10 mL via INTRA_ARTERIAL

## 2016-11-18 MED ORDER — SODIUM CHLORIDE 0.9% FLUSH: 3.0000 mL | Freq: Two times a day (BID) | INTRAVENOUS | Status: DC

## 2016-11-18 MED ORDER — VERAPAMIL HCL 2.5 MG/ML IV SOLN
INTRAVENOUS | Status: AC
  Filled 2016-11-18: qty 2

## 2016-11-18 MED ORDER — SODIUM CHLORIDE 0.9% FLUSH: 3.0000 mL | INTRAVENOUS | Status: DC | PRN

## 2016-11-18 MED ORDER — SODIUM CHLORIDE 0.9 % IV SOLN: 250.0000 mL | INTRAVENOUS | Status: DC | PRN

## 2016-11-18 MED ORDER — HEPARIN SODIUM (PORCINE) 1000 UNIT/ML IJ SOLN
INTRAMUSCULAR | Status: DC | PRN
  Administered 2016-11-18: 4000 [IU] via INTRAVENOUS

## 2016-11-18 MED ORDER — IOPAMIDOL (ISOVUE-370) INJECTION 76%
INTRAVENOUS | Status: DC | PRN
  Administered 2016-11-18: 60 mL via INTRA_ARTERIAL

## 2016-11-18 MED ORDER — LIDOCAINE HCL (PF) 1 % IJ SOLN
INTRAMUSCULAR | Status: DC | PRN
  Administered 2016-11-18: 2 mL

## 2016-11-18 MED ORDER — HEPARIN (PORCINE) IN NACL 2-0.9 UNIT/ML-% IJ SOLN
INTRAMUSCULAR | Status: AC
  Filled 2016-11-18: qty 1000

## 2016-11-18 MED ORDER — MIDAZOLAM HCL 2 MG/2ML IJ SOLN
INTRAMUSCULAR | Status: DC | PRN
Start: 2016-11-18 — End: 2016-11-18
  Administered 2016-11-18: 1 mg via INTRAVENOUS

## 2016-11-18 MED ORDER — HEPARIN SODIUM (PORCINE) 1000 UNIT/ML IJ SOLN
INTRAMUSCULAR | Status: AC
  Filled 2016-11-18: qty 1

## 2016-11-18 MED ORDER — MIDAZOLAM HCL 2 MG/2ML IJ SOLN
INTRAMUSCULAR | Status: AC
  Filled 2016-11-18: qty 2

## 2016-11-18 MED ORDER — ASPIRIN 81 MG PO CHEW: 81.0000 mg | CHEWABLE_TABLET | ORAL | Status: DC

## 2016-11-18 MED ORDER — SODIUM CHLORIDE 0.9 % WEIGHT BASED INFUSION
1.0000 mL/kg/h | INTRAVENOUS | Status: AC
  Administered 2016-11-18: 1 mL/kg/h via INTRAVENOUS

## 2016-11-18 MED ORDER — IOPAMIDOL (ISOVUE-370) INJECTION 76%
INTRAVENOUS | Status: AC
  Filled 2016-11-18: qty 100

## 2016-11-18 MED ORDER — SODIUM CHLORIDE 0.9% FLUSH
3.0000 mL | INTRAVENOUS | Status: DC | PRN
Start: 2016-11-18 — End: 2016-11-18

## 2016-11-18 MED ORDER — HEPARIN (PORCINE) IN NACL 2-0.9 UNIT/ML-% IJ SOLN
INTRAMUSCULAR | Status: DC | PRN
  Administered 2016-11-18: 1000 mL

## 2016-11-18 MED ORDER — LIDOCAINE HCL (PF) 1 % IJ SOLN
INTRAMUSCULAR | Status: AC
  Filled 2016-11-18: qty 30

## 2016-11-18 SURGICAL SUPPLY — 10 items
CATH 5FR JL3.5 JR4 ANG PIG MP (CATHETERS) ×1 IMPLANT
CATH INFINITI 5FR AL1 (CATHETERS) ×1 IMPLANT
DEVICE RAD COMP TR BAND LRG (VASCULAR PRODUCTS) ×1 IMPLANT
GLIDESHEATH SLEND SS 6F .021 (SHEATH) ×1 IMPLANT
GUIDEWIRE INQWIRE 1.5J.035X260 (WIRE) IMPLANT
INQWIRE 1.5J .035X260CM (WIRE) ×2
KIT HEART LEFT (KITS) ×2 IMPLANT
PACK CARDIAC CATHETERIZATION (CUSTOM PROCEDURE TRAY) ×2 IMPLANT
TRANSDUCER W/STOPCOCK (MISCELLANEOUS) ×2 IMPLANT
TUBING CIL FLEX 10 FLL-RA (TUBING) ×2 IMPLANT

## 2016-11-18 NOTE — Discharge Instructions (Signed)
May resume coumadin this evening Radial Site Care Refer to this sheet in the next few weeks. These instructions provide you with information about caring for yourself after your procedure. Your health care provider may also give you more specific instructions. Your treatment has been planned according to current medical practices, but problems sometimes occur. Call your health care provider if you have any problems or questions after your procedure. What can I expect after the procedure? After your procedure, it is typical to have the following:  Bruising at the radial site that usually fades within 1-2 weeks.  Blood collecting in the tissue (hematoma) that may be painful to the touch. It should usually decrease in size and tenderness within 1-2 weeks. Follow these instructions at home:  Take medicines only as directed by your health care provider.  You may shower 24-48 hours after the procedure or as directed by your health care provider. Remove the bandage (dressing) and gently wash the site with plain soap and water. Pat the area dry with a clean towel. Do not rub the site, because this may cause bleeding.  Do not take baths, swim, or use a hot tub until your health care provider approves.  Check your insertion site every day for redness, swelling, or drainage.  Do not apply powder or lotion to the site.  Do not flex or bend the affected arm for 24 hours or as directed by your health care provider.  Do not push or pull heavy objects with the affected arm for 24 hours or as directed by your health care provider.  Do not lift over 10 lb (4.5 kg) for 5 days after your procedure or as directed by your health care provider.  Ask your health care provider when it is okay to:  Return to work or school.  Resume usual physical activities or sports.  Resume sexual activity.  Do not drive home if you are discharged the same day as the procedure. Have someone else drive you.  You may  drive 24 hours after the procedure unless otherwise instructed by your health care provider.  Do not operate machinery or power tools for 24 hours after the procedure.  If your procedure was done as an outpatient procedure, which means that you went home the same day as your procedure, a responsible adult should be with you for the first 24 hours after you arrive home.  Keep all follow-up visits as directed by your health care provider. This is important. Contact a health care provider if:  You have a fever.  You have chills.  You have increased bleeding from the radial site. Hold pressure on the site. Get help right away if:  You have unusual pain at the radial site.  You have redness, warmth, or swelling at the radial site.  You have drainage (other than a small amount of blood on the dressing) from the radial site.  The radial site is bleeding, and the bleeding does not stop after 30 minutes of holding steady pressure on the site.  Your arm or hand becomes pale, cool, tingly, or numb. This information is not intended to replace advice given to you by your health care provider. Make sure you discuss any questions you have with your health care provider. Document Released: 09/03/2010 Document Revised: 01/07/2016 Document Reviewed: 02/17/2014 Elsevier Interactive Patient Education  2017 [employer].

## 2016-11-18 NOTE — Interval H&P Note (Signed)
History and Physical Interval Note:  11/18/2016 10:57 AM  Ben[place]  has presented today for surgery, with the diagnosis of angina  The various methods of treatment have been discussed with the patient and family. After consideration of risks, benefits and other options for treatment, the patient has consented to  Procedure(s): Left Heart Cath and Coronary Angiography (N/A) as a surgical intervention .  The patient's history has been reviewed, patient examined, no change in status, stable for surgery.  I have reviewed the patient's chart and labs.  Questions were answered to the patient's satisfaction.   Cath Lab Visit (complete for each Cath Lab visit)  Clinical Evaluation Leading to the Procedure:   ACS: No.  Non-ACS:    Anginal Classification: CCS III  Anti-ischemic medical therapy: Maximal Therapy (2 or more classes of medications)  Non-Invasive Test Results: No non-invasive testing performed  Prior CABG: No previous CABG        Collier Salina [facility] 11/18/2016 10:57 AM

## 2016-11-18 NOTE — H&P (View-Only) (Signed)
Cardiology Office Note  Date: 10/24/2016   ID: CASHAWN [place], DOB 03/31/1931, MRN [id number]  PCP: Manon Hilding, MD  Primary Cardiologist: Rozann Lesches, MD   Chief Complaint  Patient presents with  . Coronary Artery Disease  . Atrial Fibrillation    History of Present Illness: [place] is an 83 y.o. male last seen in June 2017. He presents today for a routine follow-up visit. States that he has noticed within the last few weeks exertional chest heaviness when he walks his dog in the morning. States that he walks at a brisk pace. Symptoms are consistent with angina. He has not used nitroglycerin.  He has a history of CAD status post BMS to the RCA and LAD in 1995. Follow-up ischemic testing in 2015 revealed mild inferior ischemia and he has preferred medical therapy and observation in the absence of progressive symptoms. We discussed these results again today, the possibility of a diagnostic cardiac catheterization as well. We also went over his medications which are stable and outlined below. Heart rate is well controlled. He did not tolerate higher doses of Norvasc previously due to leg swelling. We discussed adding Imdur as a possibility.  He continues to follow in the anticoagulation clinic on Coumadin. No reported bleeding episodes. No falls.  Past Medical History:  Diagnosis Date  . Anxiety   . Aortic regurgitation    Mild to moderate  . Coronary atherosclerosis of native coronary artery    BMS to RCA and LAD 1995  . Essential hypertension, benign   . IBS (irritable bowel syndrome)   . Lactose intolerance   . Mitral regurgitation    Moderate  . Mixed hyperlipidemia   . Permanent atrial fibrillation [facility])     Past Surgical History:  Procedure Laterality Date  . BALLOON DILATION  08/03/2012   Procedure: BALLOON DILATION;  Surgeon: Rogene [place], MD;  [facility];  Service: Endoscopy;  Laterality: N/A;  . COLONOSCOPY WITH  ESOPHAGOGASTRODUODENOSCOPY (EGD)  08/03/2012   Procedure: COLONOSCOPY WITH ESOPHAGOGASTRODUODENOSCOPY (EGD);  Surgeon: Rogene [place], MD;  [facility];  Service: Endoscopy;  Laterality: N/A;  215  . LEFT INGUINAL HERNIA    . MALONEY DILATION  08/03/2012   Procedure: MALONEY DILATION;  Surgeon: Rogene [place], MD;  [facility];  Service: Endoscopy;  Laterality: N/A;  . PENILE PROSTHESIS IMPLANT    . SAVORY DILATION  08/03/2012   Procedure: SAVORY DILATION;  Surgeon: Rogene [place], MD;  [facility];  Service: Endoscopy;  Laterality: N/A;    Current Outpatient Prescriptions  Medication Sig Dispense Refill  . acetaminophen (TYLENOL ARTHRITIS PAIN) 650 MG CR tablet Take 1,300 mg by mouth 2 (two) times daily.     Marland Kitchen amLODipine (NORVASC) 2.5 MG tablet TAKE ONE TABLET BY MOUTH DAILY 90 tablet 3  . aspirin 81 MG tablet Take 81 mg by mouth daily.    Marland Kitchen atenolol (TENORMIN) 50 MG tablet TAKE ONE TABLET BY MOUTH ONCE DAILY 90 tablet 3  . finasteride (PROSCAR) 5 MG tablet Take 5 mg by mouth daily.      . Lactase (LACTAID PO) Take by mouth as needed. Patient will chew tablets when he eats dairy products.    . nitroGLYCERIN (NITROSTAT) 0.4 MG SL tablet Place 1 tablet (0.4 mg total) under the tongue every 5 (five) minutes as needed for chest pain. 25 tablet 3  . Probiotic Product (PROBIOTIC DAILY PO) Take by mouth daily. Ultra  Flora    . simvastatin (ZOCOR) 10 MG tablet Take 10 mg by mouth every evening.    . Tamsulosin HCl (FLOMAX) 0.4 MG CAPS Take 0.4 mg by mouth daily.      . TESTOSTERONE IM Inject 1 mL into the muscle every 14 (fourteen) days. Reported on 01/21/2016    . warfarin (COUMADIN) 3 MG tablet TAKE AS DIRECTED BY COUMADIN CLINIC 135 tablet 3  . isosorbide mononitrate (IMDUR) 30 MG 24 hr tablet Take 1 tablet daily in the evening 30 tablet 1   No current facility-administered medications for this visit.    Allergies:  Patient has no known allergies.    Social History: The patient  reports that he quit smoking about 54 years ago. His smoking use included Cigarettes. He has a 50.00 pack-year smoking history. He has never used smokeless tobacco. He reports that he does not drink alcohol or use drugs.   ROS:  Please see the history of present illness. Otherwise, complete review of systems is positive for none.  All other systems are reviewed and negative.   Physical Exam: VS:  BP 131/72   Pulse 62   Ht 6' (1.829 m)   Wt 184 lb (83.5 kg)   BMI 24.95 kg/m , BMI Body mass index is 24.95 kg/m.  Wt Readings from Last 3 Encounters:  10/24/16 184 lb (83.5 kg)  01/21/16 181 lb 12.8 oz (82.5 kg)  07/28/15 187 lb 9.6 oz (85.1 kg)    Elderly male, appears comfortable at rest.  HEENT: Conjunctiva and lids normal, oropharynx clear.  Neck: Supple, no elevated JVP or carotid bruits, no thyromegaly.  Lungs: Clear to auscultation, nonlabored breathing at rest.  Cardiac: Irregularly irregular, no S3, 2/6 apical systolic murmur, no pericardial rub.  Abdomen: Soft, nontender, bowel sounds present, no guarding or rebound.  Extremities: Trace edema, distal pulses 2+.  Skin: Warm and dry. Musculoskeletal: No kyphosis. Neuropsychiatric: Alert and oriented 3, affect appropriate.  ECG: I personally reviewed the tracing from 01/21/2016 which showed rate-controlled atrial fibrillation.  Other Studies Reviewed Today:  Lexiscan Cardiolite April 2015: No diagnostic ST segment abnormalities, perfusion evidence of mild ischemia in the RCA distribution, LVEF 59%.   Echocardiogram 02/07/2013: Study Conclusions  - Left ventricle: The cavity size was normal. There was mild concentric hypertrophy. Systolic function was normal. The estimated ejection fraction was in the range of 55% to 60%. Wall motion was normal; there were no regional wall motion abnormalities. The study is not technically sufficient to allow evaluation of LV diastolic  function. - Aortic valve: Mildly calcified annulus. Trileaflet; mildly calcified leaflets. Left coronary cusp mobility was mildly restricted. Mild to moderate regurgitation. Mean gradient: 84mm Hg (S). - Mitral valve: Calcified annulus. Mildly thickened leaflets . Mild to moderate regurgitation. - Left atrium: The atrium was moderately dilated. - Right ventricle: The cavity size was mildly dilated. - Right atrium: The atrium was moderately dilated. Central venous pressure: 34mm Hg (est). - Tricuspid valve: Moderate regurgitation. - Pulmonary arteries: Systolic pressure was mildly increased. PA peak pressure: 58mm Hg (S). PA pressure: 52mm Hg (ED). - Pericardium, extracardiac: There was no pericardial effusion. Impressions:  - Unable to compare directly with previous study from June 2013. There is mild LVH with LVEF 55-60%. Indeterminate diastolic function in the setting of atrial fibrillation. Mildly thickened mitral valve with mild to moderate mitral regurgitation. Moderate biatrial enlargement noted. Sclerotic aortic valve with mild to moderate aortic regurgitation. Moderate tricuspid regurgitation with PASP 41 mmHg based on estimated  CVP.  Assessment and Plan:  1. Exertional angina in the setting of CAD status post BMS to the RCA and LAD in 1995. He had mild inferior distribution ischemia as of 2015. We have discussed possibility of medication adjustments versus diagnostic cardiac catheterization. He prefers trial of medication first. We will add Imdur 30 mg once in the evening. Follow-up visit arranged to reassess. Did discuss seeking more urgent medical attention if his symptoms suddenly escalate.  2. Chronic atrial fibrillation, no palpitations and heart rate well controlled on atenolol. He continues on Coumadin with follow-up in the anticoagulation clinic.  3. Valvular heart disease, mild to moderate mitral and aortic regurgitation by  echocardiography in 2014. No change on examination.  4. Essential hypertension, blood pressure is well controlled today.  Current medicines were reviewed with the patient today.  Disposition: Follow-up in 6 weeks.  Signed, Satira Sark, MD, [facility] 10/24/2016 8:43 AM    [facility] [address], [place], [place] [address] Phone: [phone]; Fax: [phone]

## 2016-11-21 ENCOUNTER — Encounter (HOSPITAL_COMMUNITY): Payer: Self-pay | Admitting: Cardiology

## 2016-11-24 ENCOUNTER — Ambulatory Visit (INDEPENDENT_AMBULATORY_CARE_PROVIDER_SITE_OTHER): Payer: Medicare Other | Admitting: *Deleted

## 2016-11-24 DIAGNOSIS — Z5181 Encounter for therapeutic drug level monitoring: Secondary | ICD-10-CM | POA: Diagnosis not present

## 2016-11-24 DIAGNOSIS — I482 Chronic atrial fibrillation: Secondary | ICD-10-CM

## 2016-11-24 DIAGNOSIS — I4821 Permanent atrial fibrillation: Secondary | ICD-10-CM

## 2016-11-24 DIAGNOSIS — I200 Unstable angina: Secondary | ICD-10-CM

## 2016-11-24 LAB — POCT INR: INR: 1.2

## 2016-12-06 ENCOUNTER — Encounter (INDEPENDENT_AMBULATORY_CARE_PROVIDER_SITE_OTHER): Payer: Self-pay | Admitting: Internal Medicine

## 2016-12-06 ENCOUNTER — Ambulatory Visit (INDEPENDENT_AMBULATORY_CARE_PROVIDER_SITE_OTHER): Payer: Medicare Other | Admitting: Internal Medicine

## 2016-12-06 ENCOUNTER — Ambulatory Visit (INDEPENDENT_AMBULATORY_CARE_PROVIDER_SITE_OTHER): Payer: Medicare Other | Admitting: *Deleted

## 2016-12-06 VITALS — BP 110/68 | HR 66 | Temp 97.4°F | Resp 18 | Ht 72.0 in | Wt 181.7 lb

## 2016-12-06 DIAGNOSIS — Z5181 Encounter for therapeutic drug level monitoring: Secondary | ICD-10-CM | POA: Diagnosis not present

## 2016-12-06 DIAGNOSIS — R1310 Dysphagia, unspecified: Secondary | ICD-10-CM

## 2016-12-06 DIAGNOSIS — I200 Unstable angina: Secondary | ICD-10-CM

## 2016-12-06 DIAGNOSIS — I482 Chronic atrial fibrillation: Secondary | ICD-10-CM | POA: Diagnosis not present

## 2016-12-06 DIAGNOSIS — R1319 Other dysphagia: Secondary | ICD-10-CM

## 2016-12-06 DIAGNOSIS — I4821 Permanent atrial fibrillation: Secondary | ICD-10-CM

## 2016-12-06 LAB — POCT INR: INR: 2.4

## 2016-12-06 NOTE — Progress Notes (Signed)
Presenting complaint;  Swallowing difficulty.  Subjective:  Ben[place] is 83 year old Caucasian male who is here for scheduled visit. He has history of dysphagia IBS and colonic adenoma. He was last seen 2 years ago. He had EGD ED and colonoscopy in December 2013. EGD revealed small esophageal diverticulum and small sliding hiatal hernia. Esophagus was dilated by passing 54 French Maloney dilator and no mucosal disruption induced. Colonoscopy revealed 2 small polyps and these are tubular adenomas. He also had multiple diverticula at sigmoid and descending colon.  He now presents with one-year history of intermittent dysphagia. He has adapted and having less trouble. He has noted he has difficulty when each too fast. He points to her midsternal area at site of bolus obstruction and food passes down within few seconds. He has not had any episode of food impaction. He rarely has heartburn which is relieved with Maalox. He has good appetite. He has lost 7 pounds since his last visit 2 years ago. He states his weight fluctuates between between 180 and 190 pounds. He has been using lactase when he uses daily products and it has helped him a great deal. He generally has 1 formed stool daily. He denies melena or rectal bleeding. He had cardiac cath on 11/18/2016. He decided to hold off Imdur until he sees Dr. Johnny Bridge. He states he has taken nitroglycerin only once in the last 6 months.   Current Medications: Outpatient Encounter Prescriptions as of 12/06/2016  Medication Sig  . acetaminophen (TYLENOL ARTHRITIS PAIN) 650 MG CR tablet Take 1,300 mg by mouth 2 (two) times daily.   Marland Kitchen amLODipine (NORVASC) 2.5 MG tablet TAKE ONE TABLET BY MOUTH DAILY  . Apoaequorin (PREVAGEN) 10 MG CAPS Take by mouth daily.  Marland Kitchen aspirin 81 MG tablet Take 81 mg by mouth daily.  Marland Kitchen atenolol (TENORMIN) 50 MG tablet TAKE ONE TABLET BY MOUTH ONCE DAILY  . diphenhydrAMINE (BENADRYL) 25 MG tablet Take 25 mg by mouth at bedtime.  .  finasteride (PROSCAR) 5 MG tablet Take 5 mg by mouth daily.    . Lactase 9000 units CHEW Chew 1 tablet by mouth See admin instructions. Pt takes only as needed when eating any dairy products  . MELATONIN PO Take 3 mg by mouth at bedtime.  . nitroGLYCERIN (NITROSTAT) 0.4 MG SL tablet Place 1 tablet (0.4 mg total) under the tongue every 5 (five) minutes as needed for chest pain.  . Probiotic Product (PROBIOTIC DAILY PO) Take 1 capsule by mouth daily. Ultra Flora   . Probiotic Product ([place]) Take by mouth. This is Ultraflora IB - He takes PO 4 times weekly.  . simvastatin (ZOCOR) 10 MG tablet Take 20 mg by mouth every evening.   . Tamsulosin HCl (FLOMAX) 0.4 MG CAPS Take 0.4 mg by mouth daily.    . Testosterone Cypionate 200 MG/ML KIT Inject 100-150 mg into the muscle every 14 (fourteen) days. For IM use only.   Pt uses 0.5 to 0.7 mL  . warfarin (COUMADIN) 3 MG tablet TAKE AS DIRECTED BY COUMADIN CLINIC  . isosorbide mononitrate (IMDUR) 30 MG 24 hr tablet Take 1 tablet daily in the evening (Patient not taking: Reported on 12/06/2016)   No facility-administered encounter medications on file as of 12/06/2016.      Objective: Blood pressure 110/68, pulse 66, temperature 97.4 F (36.3 C), temperature source Oral, resp. rate 18, height 6' (1.829 m), weight 181 lb 11.2 oz (82.4 kg). Patient is alert and in no acute distress.  Conjunctiva is pink. Sclera is nonicteric Oropharyngeal mucosa is normal. No neck masses or thyromegaly noted. Cardiac exam with regular rhythm normal S1 and S2. No murmur or gallop noted. Lungs are clear to auscultation. Abdomen is symmetrical and soft without tenderness organomegaly or masses. No LE edema or clubbing noted.   Assessment:  #1. Esophageal dysphagia most likely due to motility disorder. Esophageal dilation in December 2013 did not provide relief of this symptom. Will reassess him with barium study and determine if he needs further  workup. #2. History of colonic adenomas. Last colonoscopy was in December 2013. He may consider another exam this year.   Plan:  Barium pill esophagogram. Further recommendations to follow.

## 2016-12-06 NOTE — Patient Instructions (Signed)
Barium pill esophagogram to be scheduled. Physician will call with results when study completed.

## 2016-12-08 DIAGNOSIS — R1310 Dysphagia, unspecified: Secondary | ICD-10-CM | POA: Diagnosis not present

## 2016-12-13 NOTE — Progress Notes (Signed)
Cardiology Office Note  Date: 12/14/2016   ID: Benjamin [place], DOB 1930/08/20, MRN [id number]  PCP: Manon Hilding, MD  Primary Cardiologist: Rozann Lesches, MD   Chief Complaint  Patient presents with  . Coronary Artery Disease    History of Present Illness: [place] is an 83 y.o. male last seen in March. At that time he was referred for a follow-up diagnostic cardiac catheterization in light of progressive angina symptoms. Fortunately the procedure revealed nonobstructive CAD without major progression, medical therapy recommended. He was reassured by these findings, actually reports less angina at this time. Still has dyspnea on exertion.  We went over his medications. He reports compliance. He has not been using Imdur in light of improved angina.  Last echocardiogram was done at [facility] back in 2016. He has a history of mitral regurgitation that was mild to moderate at that time. We did discuss updating this study to make sure there has been no progression.  Past Medical History:  Diagnosis Date  . Anxiety   . Aortic regurgitation    Mild to moderate  . CKD (chronic kidney disease) stage 3, GFR 30-59 ml/min   . Coronary atherosclerosis of native coronary artery    BMS to RCA and LAD 1995  . Essential hypertension, benign   . IBS (irritable bowel syndrome)   . Lactose intolerance   . Mitral regurgitation    Moderate  . Mixed hyperlipidemia   . Permanent atrial fibrillation [facility])     Past Surgical History:  Procedure Laterality Date  . BALLOON DILATION  08/03/2012   Procedure: BALLOON DILATION;  Surgeon: Rogene [place], MD;  [facility];  Service: Endoscopy;  Laterality: N/A;  . COLONOSCOPY WITH ESOPHAGOGASTRODUODENOSCOPY (EGD)  08/03/2012   Procedure: COLONOSCOPY WITH ESOPHAGOGASTRODUODENOSCOPY (EGD);  Surgeon: Rogene [place], MD;  [facility];  Service: Endoscopy;  Laterality: N/A;  215  . LEFT HEART CATH AND CORONARY  ANGIOGRAPHY N/A 11/18/2016   Procedure: Left Heart Cath and Coronary Angiography;  Surgeon: Peter M Martinique, MD;  Location: [facility];  Service: Cardiovascular;  Laterality: N/A;  . LEFT INGUINAL HERNIA    . MALONEY DILATION  08/03/2012   Procedure: MALONEY DILATION;  Surgeon: Rogene [place], MD;  [facility];  Service: Endoscopy;  Laterality: N/A;  . PENILE PROSTHESIS IMPLANT    . SAVORY DILATION  08/03/2012   Procedure: SAVORY DILATION;  Surgeon: Rogene [place], MD;  [facility];  Service: Endoscopy;  Laterality: N/A;    Current Outpatient Prescriptions  Medication Sig Dispense Refill  . acetaminophen (TYLENOL ARTHRITIS PAIN) 650 MG CR tablet Take 1,300 mg by mouth 2 (two) times daily.     Marland Kitchen amLODipine (NORVASC) 2.5 MG tablet TAKE ONE TABLET BY MOUTH DAILY 90 tablet 3  . Apoaequorin (PREVAGEN) 10 MG CAPS Take by mouth daily.    Marland Kitchen aspirin 81 MG tablet Take 81 mg by mouth daily.    Marland Kitchen atenolol (TENORMIN) 50 MG tablet TAKE ONE TABLET BY MOUTH ONCE DAILY 90 tablet 3  . diphenhydrAMINE (BENADRYL) 25 MG tablet Take 25 mg by mouth at bedtime.    . finasteride (PROSCAR) 5 MG tablet Take 5 mg by mouth daily.      . Lactase 9000 units CHEW Chew 1 tablet by mouth See admin instructions. Pt takes only as needed when eating any dairy products    . MELATONIN PO Take 3 mg by mouth at bedtime.    Marland Kitchen  nitroGLYCERIN (NITROSTAT) 0.4 MG SL tablet Place 1 tablet (0.4 mg total) under the tongue every 5 (five) minutes as needed for chest pain. 25 tablet 3  . Probiotic Product (PROBIOTIC DAILY PO) Take 1 capsule by mouth daily. Ultra Flora     . Probiotic Product ([place]) Take by mouth. This is Ultraflora IB - He takes PO 4 times weekly.    . simvastatin (ZOCOR) 10 MG tablet Take 20 mg by mouth every evening.     . Tamsulosin HCl (FLOMAX) 0.4 MG CAPS Take 0.4 mg by mouth daily.      . Testosterone Cypionate 200 MG/ML KIT Inject 100-150 mg into the muscle every 14  (fourteen) days. For IM use only.   Pt uses 0.5 to 0.7 mL    . warfarin (COUMADIN) 3 MG tablet TAKE AS DIRECTED BY COUMADIN CLINIC 135 tablet 3   No current facility-administered medications for this visit.    Allergies:  Patient has no known allergies.   Social History: The patient  reports that he quit smoking about 54 years ago. His smoking use included Cigarettes. He has a 50.00 pack-year smoking history. He has never used smokeless tobacco. He reports that he does not drink alcohol or use drugs.   ROS:  Please see the history of present illness. Otherwise, complete review of systems is positive for dysphagia - he sees Dr Laural Golden.  All other systems are reviewed and negative.   Physical Exam: VS:  BP 118/70   Pulse (!) 55   Ht 6' (1.829 m)   Wt 180 lb (81.6 kg)   BMI 24.41 kg/m , BMI Body mass index is 24.41 kg/m.  Wt Readings from Last 3 Encounters:  12/14/16 180 lb (81.6 kg)  12/06/16 181 lb 11.2 oz (82.4 kg)  11/18/16 175 lb (79.4 kg)    General: Elderly male, appears comfortable at rest. HEENT: Conjunctiva and lids normal, oropharynx clear with moist mucosa. Neck: Supple, no elevated JVP or carotid bruits, no thyromegaly. Lungs: Clear to auscultation, nonlabored breathing at rest. Cardiac: Irregularly irregular, no S3, soft apical systolic murmur, no pericardial rub. Abdomen: Soft, nontender, bowel sounds present, no guarding or rebound. Extremities: No pitting edema, distal pulses 2+. Skin: Warm and dry. Musculoskeletal: No kyphosis. Neuropsychiatric: Alert and oriented x3, affect grossly appropriate.  ECG: I personally reviewed the tracing from 11/18/2016 which showed rate-controlled atrial fibrillation with lead motion artifact.  Recent Labwork: 11/18/2016: BUN 39; Creatinine, Ser 1.92; Hemoglobin 13.9; Platelets 154; Potassium 4.8; Sodium 138   Other Studies Reviewed Today:  Cardiac catheterization 11/18/2016:  Ost RCA to Mid RCA lesion, 25 %stenosed.  Dist RCA  lesion, 45 %stenosed.  Ost LAD to Prox LAD lesion, 0 %stenosed.  Prox LAD to Mid LAD lesion, 35 %stenosed.  LV end diastolic pressure is normal.  Mid LAD to Dist LAD lesion, 35 %stenosed.   1. Nonobstructive CAD 2. Normal LVEDP  Assessment and Plan:  1. Overall mild to moderate nonobstructive CAD by recent cardiac catheterization. Plan to continue medical therapy for treatment of angina. No changes made to current regimen. He can hold off on Imdur as long as his angina is not worsening.  2. Dyspnea on exertion. Likely multifactorial. Heart rate control of atrial fibrillation looks to be adequate. We will update his echocardiogram to make sure that mitral regurgitation has not worsened.  3. Chronic atrial fibrillation, continue Coumadin and atenolol.  4. CKD stage 3, recent creatinine 1.9. Keep follow-up with Dr. Quintin Alto.  Current  medicines were reviewed with the patient today.   Orders Placed This Encounter  Procedures  . ECHOCARDIOGRAM COMPLETE    Disposition: Follow-up in 6 months.  Signed, Satira Sark, MD, [facility] 12/14/2016 8:18 AM    [facility] [place], [place], [place] [address] Phone: [phone]; Fax: [phone]

## 2016-12-14 ENCOUNTER — Encounter: Payer: Self-pay | Admitting: Cardiology

## 2016-12-14 ENCOUNTER — Ambulatory Visit (INDEPENDENT_AMBULATORY_CARE_PROVIDER_SITE_OTHER): Payer: Medicare Other | Admitting: Cardiology

## 2016-12-14 ENCOUNTER — Telehealth: Payer: Self-pay | Admitting: Cardiology

## 2016-12-14 VITALS — BP 118/70 | HR 55 | Ht 72.0 in | Wt 180.0 lb

## 2016-12-14 DIAGNOSIS — I25119 Atherosclerotic heart disease of native coronary artery with unspecified angina pectoris: Secondary | ICD-10-CM

## 2016-12-14 DIAGNOSIS — I482 Chronic atrial fibrillation, unspecified: Secondary | ICD-10-CM

## 2016-12-14 DIAGNOSIS — I340 Nonrheumatic mitral (valve) insufficiency: Secondary | ICD-10-CM

## 2016-12-14 DIAGNOSIS — R0609 Other forms of dyspnea: Secondary | ICD-10-CM | POA: Diagnosis not present

## 2016-12-14 DIAGNOSIS — I209 Angina pectoris, unspecified: Secondary | ICD-10-CM

## 2016-12-14 DIAGNOSIS — N183 Chronic kidney disease, stage 3 unspecified: Secondary | ICD-10-CM

## 2016-12-14 DIAGNOSIS — I200 Unstable angina: Secondary | ICD-10-CM | POA: Diagnosis not present

## 2016-12-14 NOTE — Telephone Encounter (Signed)
Echo schedule in [place] Jan 11, 2017

## 2016-12-14 NOTE — Patient Instructions (Signed)

## 2016-12-15 ENCOUNTER — Telehealth (INDEPENDENT_AMBULATORY_CARE_PROVIDER_SITE_OTHER): Payer: Self-pay | Admitting: Internal Medicine

## 2016-12-15 NOTE — Telephone Encounter (Signed)
Patient has left two messages.  He wants someone to bring him up to date on the swallow test.

## 2016-12-15 NOTE — Telephone Encounter (Signed)
Patient was called and made aware that the results were here ,Dr.Rehman will review and give him a call with the results.

## 2016-12-15 NOTE — Telephone Encounter (Signed)
Patient called, stated that he had a swallow test at [facility] a week ago and has not heard anything from Dr. Laural Golden or his office.  [phone]

## 2016-12-19 ENCOUNTER — Other Ambulatory Visit: Payer: Self-pay | Admitting: Cardiology

## 2016-12-19 DIAGNOSIS — D509 Iron deficiency anemia, unspecified: Secondary | ICD-10-CM | POA: Diagnosis not present

## 2016-12-19 DIAGNOSIS — R809 Proteinuria, unspecified: Secondary | ICD-10-CM | POA: Diagnosis not present

## 2016-12-19 DIAGNOSIS — E559 Vitamin D deficiency, unspecified: Secondary | ICD-10-CM | POA: Diagnosis not present

## 2016-12-19 DIAGNOSIS — N183 Chronic kidney disease, stage 3 (moderate): Secondary | ICD-10-CM | POA: Diagnosis not present

## 2016-12-19 DIAGNOSIS — Z79899 Other long term (current) drug therapy: Secondary | ICD-10-CM | POA: Diagnosis not present

## 2016-12-19 DIAGNOSIS — I129 Hypertensive chronic kidney disease with stage 1 through stage 4 chronic kidney disease, or unspecified chronic kidney disease: Secondary | ICD-10-CM | POA: Diagnosis not present

## 2016-12-19 NOTE — Telephone Encounter (Signed)
BPE films reviewed. Results reviewed with patient. He has severe esophageal motility disorder. Will try sublingual nitroglycerin 5 minutes before each meal. If it does not help will try low dose cardia serum. Patient will call with progress report next week.

## 2016-12-27 DIAGNOSIS — N183 Chronic kidney disease, stage 3 (moderate): Secondary | ICD-10-CM | POA: Diagnosis not present

## 2016-12-27 DIAGNOSIS — N250 Renal osteodystrophy: Secondary | ICD-10-CM | POA: Diagnosis not present

## 2016-12-27 DIAGNOSIS — D649 Anemia, unspecified: Secondary | ICD-10-CM | POA: Diagnosis not present

## 2016-12-27 DIAGNOSIS — I10 Essential (primary) hypertension: Secondary | ICD-10-CM | POA: Diagnosis not present

## 2017-01-03 ENCOUNTER — Ambulatory Visit (INDEPENDENT_AMBULATORY_CARE_PROVIDER_SITE_OTHER): Payer: Medicare Other | Admitting: *Deleted

## 2017-01-03 DIAGNOSIS — I482 Chronic atrial fibrillation: Secondary | ICD-10-CM | POA: Diagnosis not present

## 2017-01-03 DIAGNOSIS — Z5181 Encounter for therapeutic drug level monitoring: Secondary | ICD-10-CM | POA: Diagnosis not present

## 2017-01-03 DIAGNOSIS — I200 Unstable angina: Secondary | ICD-10-CM | POA: Diagnosis not present

## 2017-01-03 DIAGNOSIS — I4821 Permanent atrial fibrillation: Secondary | ICD-10-CM

## 2017-01-03 LAB — POCT INR: INR: 2.2

## 2017-01-10 ENCOUNTER — Other Ambulatory Visit: Payer: Self-pay | Admitting: Cardiology

## 2017-01-11 ENCOUNTER — Other Ambulatory Visit: Payer: Self-pay

## 2017-01-11 ENCOUNTER — Telehealth: Payer: Self-pay

## 2017-01-11 ENCOUNTER — Ambulatory Visit (INDEPENDENT_AMBULATORY_CARE_PROVIDER_SITE_OTHER): Payer: Medicare Other

## 2017-01-11 DIAGNOSIS — I340 Nonrheumatic mitral (valve) insufficiency: Secondary | ICD-10-CM

## 2017-01-11 NOTE — Telephone Encounter (Signed)
Patient notified and verbalized understanding. Routed to PCP  

## 2017-01-11 NOTE — Telephone Encounter (Signed)
-----   Message from Satira Sark, MD sent at 01/11/2017 10:35 AM EDT ----- Results reviewed. LVEF low normal range of 50-55% in the setting of atrial fibrillation. He has moderate aortic and mitral regurgitation, not clearly contributing to symptoms at this point but we will continue to follow. A copy of this test should be forwarded to Manon Hilding, MD.

## 2017-01-16 DIAGNOSIS — N183 Chronic kidney disease, stage 3 (moderate): Secondary | ICD-10-CM | POA: Diagnosis not present

## 2017-01-16 DIAGNOSIS — E875 Hyperkalemia: Secondary | ICD-10-CM | POA: Diagnosis not present

## 2017-01-16 DIAGNOSIS — I10 Essential (primary) hypertension: Secondary | ICD-10-CM | POA: Diagnosis not present

## 2017-01-16 DIAGNOSIS — R739 Hyperglycemia, unspecified: Secondary | ICD-10-CM | POA: Diagnosis not present

## 2017-01-16 DIAGNOSIS — E782 Mixed hyperlipidemia: Secondary | ICD-10-CM | POA: Diagnosis not present

## 2017-01-16 DIAGNOSIS — E7800 Pure hypercholesterolemia, unspecified: Secondary | ICD-10-CM | POA: Diagnosis not present

## 2017-01-19 DIAGNOSIS — N183 Chronic kidney disease, stage 3 (moderate): Secondary | ICD-10-CM | POA: Diagnosis not present

## 2017-01-19 DIAGNOSIS — E782 Mixed hyperlipidemia: Secondary | ICD-10-CM | POA: Diagnosis not present

## 2017-01-19 DIAGNOSIS — Z6825 Body mass index (BMI) 25.0-25.9, adult: Secondary | ICD-10-CM | POA: Diagnosis not present

## 2017-01-19 DIAGNOSIS — F5101 Primary insomnia: Secondary | ICD-10-CM | POA: Diagnosis not present

## 2017-01-19 DIAGNOSIS — I10 Essential (primary) hypertension: Secondary | ICD-10-CM | POA: Diagnosis not present

## 2017-01-19 DIAGNOSIS — I2510 Atherosclerotic heart disease of native coronary artery without angina pectoris: Secondary | ICD-10-CM | POA: Diagnosis not present

## 2017-01-19 DIAGNOSIS — G3184 Mild cognitive impairment, so stated: Secondary | ICD-10-CM | POA: Diagnosis not present

## 2017-01-19 DIAGNOSIS — I482 Chronic atrial fibrillation: Secondary | ICD-10-CM | POA: Diagnosis not present

## 2017-01-23 ENCOUNTER — Encounter (INDEPENDENT_AMBULATORY_CARE_PROVIDER_SITE_OTHER): Payer: Self-pay

## 2017-01-31 ENCOUNTER — Ambulatory Visit (INDEPENDENT_AMBULATORY_CARE_PROVIDER_SITE_OTHER): Payer: Medicare Other | Admitting: *Deleted

## 2017-01-31 DIAGNOSIS — Z5181 Encounter for therapeutic drug level monitoring: Secondary | ICD-10-CM | POA: Diagnosis not present

## 2017-01-31 DIAGNOSIS — I482 Chronic atrial fibrillation: Secondary | ICD-10-CM

## 2017-01-31 DIAGNOSIS — I4821 Permanent atrial fibrillation: Secondary | ICD-10-CM

## 2017-01-31 DIAGNOSIS — I200 Unstable angina: Secondary | ICD-10-CM

## 2017-01-31 LAB — POCT INR: INR: 2.1

## 2017-02-27 ENCOUNTER — Other Ambulatory Visit: Payer: Self-pay | Admitting: Cardiology

## 2017-03-16 ENCOUNTER — Ambulatory Visit (INDEPENDENT_AMBULATORY_CARE_PROVIDER_SITE_OTHER): Payer: Medicare Other | Admitting: *Deleted

## 2017-03-16 DIAGNOSIS — Z5181 Encounter for therapeutic drug level monitoring: Secondary | ICD-10-CM | POA: Diagnosis not present

## 2017-03-16 DIAGNOSIS — I4821 Permanent atrial fibrillation: Secondary | ICD-10-CM

## 2017-03-16 DIAGNOSIS — I200 Unstable angina: Secondary | ICD-10-CM

## 2017-03-16 DIAGNOSIS — I482 Chronic atrial fibrillation: Secondary | ICD-10-CM | POA: Diagnosis not present

## 2017-03-16 LAB — POCT INR: INR: 1.6

## 2017-04-04 ENCOUNTER — Ambulatory Visit (INDEPENDENT_AMBULATORY_CARE_PROVIDER_SITE_OTHER): Payer: Medicare Other | Admitting: *Deleted

## 2017-04-04 DIAGNOSIS — I200 Unstable angina: Secondary | ICD-10-CM

## 2017-04-04 DIAGNOSIS — Z5181 Encounter for therapeutic drug level monitoring: Secondary | ICD-10-CM

## 2017-04-04 DIAGNOSIS — I482 Chronic atrial fibrillation: Secondary | ICD-10-CM

## 2017-04-04 DIAGNOSIS — I4821 Permanent atrial fibrillation: Secondary | ICD-10-CM

## 2017-04-04 LAB — POCT INR: INR: 2

## 2017-04-13 DIAGNOSIS — M9903 Segmental and somatic dysfunction of lumbar region: Secondary | ICD-10-CM | POA: Diagnosis not present

## 2017-04-13 DIAGNOSIS — S338XXA Sprain of other parts of lumbar spine and pelvis, initial encounter: Secondary | ICD-10-CM | POA: Diagnosis not present

## 2017-04-25 ENCOUNTER — Ambulatory Visit (INDEPENDENT_AMBULATORY_CARE_PROVIDER_SITE_OTHER): Payer: Medicare Other | Admitting: *Deleted

## 2017-04-25 DIAGNOSIS — Z5181 Encounter for therapeutic drug level monitoring: Secondary | ICD-10-CM

## 2017-04-25 DIAGNOSIS — I482 Chronic atrial fibrillation: Secondary | ICD-10-CM

## 2017-04-25 DIAGNOSIS — I4821 Permanent atrial fibrillation: Secondary | ICD-10-CM

## 2017-04-25 LAB — POCT INR: INR: 1.8

## 2017-04-26 DIAGNOSIS — E559 Vitamin D deficiency, unspecified: Secondary | ICD-10-CM | POA: Diagnosis not present

## 2017-04-26 DIAGNOSIS — I129 Hypertensive chronic kidney disease with stage 1 through stage 4 chronic kidney disease, or unspecified chronic kidney disease: Secondary | ICD-10-CM | POA: Diagnosis not present

## 2017-04-26 DIAGNOSIS — Z79899 Other long term (current) drug therapy: Secondary | ICD-10-CM | POA: Diagnosis not present

## 2017-04-26 DIAGNOSIS — D509 Iron deficiency anemia, unspecified: Secondary | ICD-10-CM | POA: Diagnosis not present

## 2017-04-26 DIAGNOSIS — R809 Proteinuria, unspecified: Secondary | ICD-10-CM | POA: Diagnosis not present

## 2017-04-26 DIAGNOSIS — N184 Chronic kidney disease, stage 4 (severe): Secondary | ICD-10-CM | POA: Diagnosis not present

## 2017-05-02 DIAGNOSIS — I10 Essential (primary) hypertension: Secondary | ICD-10-CM | POA: Diagnosis not present

## 2017-05-02 DIAGNOSIS — E875 Hyperkalemia: Secondary | ICD-10-CM | POA: Diagnosis not present

## 2017-05-02 DIAGNOSIS — D649 Anemia, unspecified: Secondary | ICD-10-CM | POA: Diagnosis not present

## 2017-05-02 DIAGNOSIS — N183 Chronic kidney disease, stage 3 (moderate): Secondary | ICD-10-CM | POA: Diagnosis not present

## 2017-05-16 ENCOUNTER — Ambulatory Visit (INDEPENDENT_AMBULATORY_CARE_PROVIDER_SITE_OTHER): Payer: Medicare Other | Admitting: *Deleted

## 2017-05-16 DIAGNOSIS — I4821 Permanent atrial fibrillation: Secondary | ICD-10-CM

## 2017-05-16 DIAGNOSIS — Z5181 Encounter for therapeutic drug level monitoring: Secondary | ICD-10-CM

## 2017-05-16 DIAGNOSIS — I482 Chronic atrial fibrillation: Secondary | ICD-10-CM | POA: Diagnosis not present

## 2017-05-16 LAB — POCT INR: INR: 2.1

## 2017-05-17 DIAGNOSIS — Z23 Encounter for immunization: Secondary | ICD-10-CM | POA: Diagnosis not present

## 2017-06-07 NOTE — Progress Notes (Signed)
Cardiology Office Note  Date: 06/12/2017   ID: Benjamin Mason, DOB Jun 25, 1931, MRN [id number]  PCP: Manon Hilding, MD  Primary Cardiologist: Rozann Lesches, MD   Chief Complaint  Patient presents with  . Coronary Artery Disease    History of Present Illness: Benjamin Mason is an 83 y.o. male last seen in May. He presents for a routine follow-up visit.  Reports no progressive angina symptoms or shortness of breath beyond NYHA class II.  No palpitations, dizziness, or syncope.  He continues on Coumadin with follow-up in the anticoagulation clinic. He has had no spontaneous bleeding problems. Last INR 1.9.  Follow-up echocardiogram in May revealed LVEF 50-55% with moderate aortic regurgitation, moderate mitral regurgitation, and moderate tricuspid regurgitation.  We discussed the results today.  Plan to continue observation.  We also went over his medications which are stable from a cardiac perspective and outlined below.  Heart rate control in atrial fibrillation has been adequate on atenolol.  He is bradycardic at baseline, reports no unusual fatigue.  Past Medical History:  Diagnosis Date  . Anxiety   . Aortic regurgitation    Mild to moderate  . CKD (chronic kidney disease) stage 3, GFR 30-59 ml/min (HCC)   . Coronary atherosclerosis of native coronary artery    BMS to RCA and LAD 1995  . Essential hypertension, benign   . IBS (irritable bowel syndrome)   . Lactose intolerance   . Mitral regurgitation    Moderate  . Mixed hyperlipidemia   . Permanent atrial fibrillation [facility])     Past Surgical History:  Procedure Laterality Date  . BALLOON DILATION  08/03/2012   Procedure: BALLOON DILATION;  Surgeon: Rogene Houston, MD;  [facility];  Service: Endoscopy;  Laterality: N/A;  . COLONOSCOPY WITH ESOPHAGOGASTRODUODENOSCOPY (EGD)  08/03/2012   Procedure: COLONOSCOPY WITH ESOPHAGOGASTRODUODENOSCOPY (EGD);  Surgeon: Rogene Houston, MD;  [facility];  Service: Endoscopy;  Laterality: N/A;  215  . LEFT HEART CATH AND CORONARY ANGIOGRAPHY N/A 11/18/2016   Procedure: Left Heart Cath and Coronary Angiography;  Surgeon: Peter M Martinique, MD;  Location: [facility] CV LAB;  Service: Cardiovascular;  Laterality: N/A;  . LEFT INGUINAL HERNIA    . MALONEY DILATION  08/03/2012   Procedure: MALONEY DILATION;  Surgeon: Rogene Houston, MD;  [facility];  Service: Endoscopy;  Laterality: N/A;  . PENILE PROSTHESIS IMPLANT    . SAVORY DILATION  08/03/2012   Procedure: SAVORY DILATION;  Surgeon: Rogene Houston, MD;  [facility];  Service: Endoscopy;  Laterality: N/A;    Current Outpatient Prescriptions  Medication Sig Dispense Refill  . acetaminophen (TYLENOL ARTHRITIS PAIN) 650 MG CR tablet Take 1,300 mg by mouth 2 (two) times daily.     Marland Kitchen amLODipine (NORVASC) 2.5 MG tablet TAKE ONE TABLET BY MOUTH DAILY 90 tablet 3  . Apoaequorin (PREVAGEN) 10 MG CAPS Take by mouth daily.    Marland Kitchen aspirin 81 MG tablet Take 81 mg by mouth daily.    Marland Kitchen atenolol (TENORMIN) 50 MG tablet TAKE ONE TABLET BY MOUTH ONCE DAILY 90 tablet 3  . diphenhydrAMINE (BENADRYL) 25 MG tablet Take 25 mg by mouth at bedtime.    . finasteride (PROSCAR) 5 MG tablet Take 5 mg by mouth daily.      . Lactase 9000 units CHEW Chew 1 tablet by mouth See admin instructions. Pt takes only as needed when eating any dairy products    .  MELATONIN PO Take 3 mg by mouth at bedtime.    Marland Kitchen NITROSTAT 0.4 MG SL tablet PLACE ONE TABLET UNDER THE TONGUE EVERY 5 MINUTES AS NEEDED FOR CHEST PAIN 25 tablet 3  . Probiotic Product ([place]) Take by mouth. This is Ultraflora IB - He takes PO 4 times weekly.    . simvastatin (ZOCOR) 10 MG tablet Take 20 mg by mouth every evening.     . Tamsulosin HCl (FLOMAX) 0.4 MG CAPS Take 0.4 mg by mouth daily.      . Testosterone Cypionate 200 MG/ML KIT Inject 100-150 mg into the muscle every 14 (fourteen) days. For IM use only.     Pt uses 0.5 to 0.7 mL    . warfarin (COUMADIN) 3 MG tablet TAKE AS DIRECTED BY COUMADIN CLINIC 135 tablet 3   No current facility-administered medications for this visit.    Allergies:  Patient has no known allergies.   Social History: The patient  reports that he quit smoking about 54 years ago. His smoking use included Cigarettes. He has a 50.00 pack-year smoking history. He has never used smokeless tobacco. He reports that he does not drink alcohol or use drugs.   ROS:  Please see the history of present illness. Otherwise, complete review of systems is positive for hearing loss, some trouble with memory.  All other systems are reviewed and negative.   Physical Exam: VS:  BP 122/82   Pulse (!) 53   Ht 6' (1.829 m)   Wt 181 lb (82.1 kg)   SpO2 97%   BMI 24.55 kg/m , BMI Body mass index is 24.55 kg/m.  Wt Readings from Last 3 Encounters:  06/12/17 181 lb (82.1 kg)  12/14/16 180 lb (81.6 kg)  12/06/16 181 lb 11.2 oz (82.4 kg)    General: Elderly male, appears comfortable at rest. HEENT: Conjunctiva and lids normal, oropharynx clear. Neck: Supple, no elevated JVP or carotid bruits, no thyromegaly. Lungs: Clear to auscultation, nonlabored breathing at rest. Cardiac: Irregularly irregular, no S3, soft apical systolic murmur, soft basal diastolic murmur, no pericardial rub. Abdomen: Soft, nontender, bowel sounds present, no guarding or rebound. Extremities: No pitting edema, distal pulses 2+. Skin: Warm and dry. Musculoskeletal: No kyphosis. Neuropsychiatric: Alert and oriented x3, affect grossly appropriate.  ECG: I personally reviewed the tracing from4/01/2017 which showed rate-controlled atrial fibrillation.  Recent Labwork: 11/18/2016: BUN 39; Creatinine, Ser 1.92; Hemoglobin 13.9; Platelets 154; Potassium 4.8; Sodium 138   Other Studies Reviewed Today:  Echocardiogram 01/11/2017: Study Conclusions  - Left ventricle: The cavity size was normal. Wall thickness was    normal. Systolic function was normal. The estimated ejection   fraction was in the range of 50% to 55%. Wall motion was normal;   there were no regional wall motion abnormalities. The study was   not technically sufficient to allow evaluation of LV diastolic   dysfunction due to atrial fibrillation. - Aortic valve: Trileaflet; mildly thickened, mildly calcified   leaflets. There was no stenosis. There was moderate   regurgitation. - Mitral valve: Mildly thickened leaflets . There were two   regurgitant jets (one mild to moderate in severity, the other   mild), leading to overall moderate regurgitation. - Left atrium: The atrium was moderately dilated. - Right atrium: The atrium was moderately dilated. - Tricuspid valve: Mildly thickened leaflets. There was moderate   regurgitation. - Pulmonic valve: There was mild regurgitation. - Pulmonary arteries: Inadequate tricuspid regurgitant jet to   accurately assess  pulmonary pressures.  Cardiac catheterization 11/18/2016:  Ost RCA to Mid RCA lesion, 25 %stenosed.  Dist RCA lesion, 45 %stenosed.  Ost LAD to Prox LAD lesion, 0 %stenosed.  Prox LAD to Mid LAD lesion, 35 %stenosed.  LV end diastolic pressure is normal.  Mid LAD to Dist LAD lesion, 35 %stenosed.   1. Nonobstructive CAD 2. Normal LVEDP  Assessment and Plan:  1.  Chronic atrial fibrillation.  Continue strategy of heart rate control and anticoagulation.  He remains on Coumadin with follow-up in the anticoagulation clinic.  2.  Mild to moderate nonobstructive CAD by cardiac catheterization in April.  No active angina symptoms at this time on medical therapy.  He continues on low-dose aspirin and statin.  3.  CKD stage 3, followed by Dr. Quintin Alto.  Last creatinine 1.9.  4.  Valvular heart disease with moderate aortic, mitral, and tricuspid regurgitation by most recent follow-up echocardiogram.  Continue observation.  Current medicines were reviewed with the patient  today.  Disposition: Follow-up in 6 months.  Signed, Satira Sark, MD, [facility] 06/12/2017 8:58 AM    [facility] [address], [place], [place] [address] Phone: [phone]; Fax: [phone]

## 2017-06-08 ENCOUNTER — Ambulatory Visit (INDEPENDENT_AMBULATORY_CARE_PROVIDER_SITE_OTHER): Payer: Medicare Other | Admitting: *Deleted

## 2017-06-08 DIAGNOSIS — I4821 Permanent atrial fibrillation: Secondary | ICD-10-CM

## 2017-06-08 DIAGNOSIS — I482 Chronic atrial fibrillation: Secondary | ICD-10-CM

## 2017-06-08 DIAGNOSIS — Z5181 Encounter for therapeutic drug level monitoring: Secondary | ICD-10-CM | POA: Diagnosis not present

## 2017-06-08 LAB — POCT INR: INR: 1.9

## 2017-06-12 ENCOUNTER — Encounter: Payer: Self-pay | Admitting: Cardiology

## 2017-06-12 ENCOUNTER — Ambulatory Visit (INDEPENDENT_AMBULATORY_CARE_PROVIDER_SITE_OTHER): Payer: Medicare Other | Admitting: Cardiology

## 2017-06-12 VITALS — BP 122/82 | HR 53 | Ht 72.0 in | Wt 181.0 lb

## 2017-06-12 DIAGNOSIS — I482 Chronic atrial fibrillation: Secondary | ICD-10-CM | POA: Diagnosis not present

## 2017-06-12 DIAGNOSIS — N401 Enlarged prostate with lower urinary tract symptoms: Secondary | ICD-10-CM | POA: Diagnosis not present

## 2017-06-12 DIAGNOSIS — N183 Chronic kidney disease, stage 3 unspecified: Secondary | ICD-10-CM

## 2017-06-12 DIAGNOSIS — I4821 Permanent atrial fibrillation: Secondary | ICD-10-CM

## 2017-06-12 DIAGNOSIS — I38 Endocarditis, valve unspecified: Secondary | ICD-10-CM

## 2017-06-12 DIAGNOSIS — I200 Unstable angina: Secondary | ICD-10-CM

## 2017-06-12 DIAGNOSIS — E291 Testicular hypofunction: Secondary | ICD-10-CM | POA: Diagnosis not present

## 2017-06-12 DIAGNOSIS — I209 Angina pectoris, unspecified: Secondary | ICD-10-CM | POA: Diagnosis not present

## 2017-06-12 DIAGNOSIS — I25119 Atherosclerotic heart disease of native coronary artery with unspecified angina pectoris: Secondary | ICD-10-CM | POA: Diagnosis not present

## 2017-06-12 DIAGNOSIS — N400 Enlarged prostate without lower urinary tract symptoms: Secondary | ICD-10-CM | POA: Diagnosis not present

## 2017-06-12 NOTE — Patient Instructions (Signed)

## 2017-07-04 ENCOUNTER — Ambulatory Visit (INDEPENDENT_AMBULATORY_CARE_PROVIDER_SITE_OTHER): Payer: Medicare Other | Admitting: *Deleted

## 2017-07-04 DIAGNOSIS — I482 Chronic atrial fibrillation: Secondary | ICD-10-CM | POA: Diagnosis not present

## 2017-07-04 DIAGNOSIS — I4821 Permanent atrial fibrillation: Secondary | ICD-10-CM

## 2017-07-04 DIAGNOSIS — Z5181 Encounter for therapeutic drug level monitoring: Secondary | ICD-10-CM

## 2017-07-04 LAB — POCT INR: INR: 1.7

## 2017-07-13 DIAGNOSIS — I10 Essential (primary) hypertension: Secondary | ICD-10-CM | POA: Diagnosis not present

## 2017-07-13 DIAGNOSIS — E875 Hyperkalemia: Secondary | ICD-10-CM | POA: Diagnosis not present

## 2017-07-13 DIAGNOSIS — E7800 Pure hypercholesterolemia, unspecified: Secondary | ICD-10-CM | POA: Diagnosis not present

## 2017-07-13 DIAGNOSIS — E782 Mixed hyperlipidemia: Secondary | ICD-10-CM | POA: Diagnosis not present

## 2017-07-17 DIAGNOSIS — I10 Essential (primary) hypertension: Secondary | ICD-10-CM | POA: Diagnosis not present

## 2017-07-17 DIAGNOSIS — I2510 Atherosclerotic heart disease of native coronary artery without angina pectoris: Secondary | ICD-10-CM | POA: Diagnosis not present

## 2017-07-17 DIAGNOSIS — N183 Chronic kidney disease, stage 3 (moderate): Secondary | ICD-10-CM | POA: Diagnosis not present

## 2017-07-17 DIAGNOSIS — F5101 Primary insomnia: Secondary | ICD-10-CM | POA: Diagnosis not present

## 2017-07-17 DIAGNOSIS — E782 Mixed hyperlipidemia: Secondary | ICD-10-CM | POA: Diagnosis not present

## 2017-07-17 DIAGNOSIS — I482 Chronic atrial fibrillation: Secondary | ICD-10-CM | POA: Diagnosis not present

## 2017-07-17 DIAGNOSIS — G3184 Mild cognitive impairment, so stated: Secondary | ICD-10-CM | POA: Diagnosis not present

## 2017-07-17 DIAGNOSIS — Z0000 Encounter for general adult medical examination without abnormal findings: Secondary | ICD-10-CM | POA: Diagnosis not present

## 2017-07-17 DIAGNOSIS — Z6825 Body mass index (BMI) 25.0-25.9, adult: Secondary | ICD-10-CM | POA: Diagnosis not present

## 2017-07-28 ENCOUNTER — Encounter (INDEPENDENT_AMBULATORY_CARE_PROVIDER_SITE_OTHER): Payer: Self-pay | Admitting: *Deleted

## 2017-08-10 ENCOUNTER — Ambulatory Visit (INDEPENDENT_AMBULATORY_CARE_PROVIDER_SITE_OTHER): Payer: Medicare Other | Admitting: *Deleted

## 2017-08-10 DIAGNOSIS — I4821 Permanent atrial fibrillation: Secondary | ICD-10-CM

## 2017-08-10 DIAGNOSIS — I482 Chronic atrial fibrillation: Secondary | ICD-10-CM

## 2017-08-10 DIAGNOSIS — Z5181 Encounter for therapeutic drug level monitoring: Secondary | ICD-10-CM | POA: Diagnosis not present

## 2017-08-10 LAB — POCT INR: INR: 2.3

## 2017-09-07 ENCOUNTER — Ambulatory Visit (INDEPENDENT_AMBULATORY_CARE_PROVIDER_SITE_OTHER): Payer: Medicare Other | Admitting: *Deleted

## 2017-09-07 DIAGNOSIS — I482 Chronic atrial fibrillation: Secondary | ICD-10-CM

## 2017-09-07 DIAGNOSIS — Z5181 Encounter for therapeutic drug level monitoring: Secondary | ICD-10-CM

## 2017-09-07 DIAGNOSIS — I4821 Permanent atrial fibrillation: Secondary | ICD-10-CM

## 2017-09-07 LAB — POCT INR: INR: 1.9

## 2017-09-07 NOTE — Patient Instructions (Signed)
Take 2 tablets tonight then resume 1 tablet daily except 1 1/2 tablets on Tuesdays, Thursdays and Saturdays Recheck in 4 weeks

## 2017-09-19 DIAGNOSIS — H353132 Nonexudative age-related macular degeneration, bilateral, intermediate dry stage: Secondary | ICD-10-CM | POA: Diagnosis not present

## 2017-09-19 DIAGNOSIS — H43813 Vitreous degeneration, bilateral: Secondary | ICD-10-CM | POA: Diagnosis not present

## 2017-09-19 DIAGNOSIS — T8522XS Displacement of intraocular lens, sequela: Secondary | ICD-10-CM | POA: Diagnosis not present

## 2017-10-02 DIAGNOSIS — N184 Chronic kidney disease, stage 4 (severe): Secondary | ICD-10-CM | POA: Diagnosis not present

## 2017-10-02 DIAGNOSIS — Z79899 Other long term (current) drug therapy: Secondary | ICD-10-CM | POA: Diagnosis not present

## 2017-10-02 DIAGNOSIS — R809 Proteinuria, unspecified: Secondary | ICD-10-CM | POA: Diagnosis not present

## 2017-10-02 DIAGNOSIS — E559 Vitamin D deficiency, unspecified: Secondary | ICD-10-CM | POA: Diagnosis not present

## 2017-10-02 DIAGNOSIS — D509 Iron deficiency anemia, unspecified: Secondary | ICD-10-CM | POA: Diagnosis not present

## 2017-10-02 DIAGNOSIS — I129 Hypertensive chronic kidney disease with stage 1 through stage 4 chronic kidney disease, or unspecified chronic kidney disease: Secondary | ICD-10-CM | POA: Diagnosis not present

## 2017-10-03 DIAGNOSIS — I10 Essential (primary) hypertension: Secondary | ICD-10-CM | POA: Diagnosis not present

## 2017-10-03 DIAGNOSIS — D649 Anemia, unspecified: Secondary | ICD-10-CM | POA: Diagnosis not present

## 2017-10-03 DIAGNOSIS — N183 Chronic kidney disease, stage 3 (moderate): Secondary | ICD-10-CM | POA: Diagnosis not present

## 2017-10-03 DIAGNOSIS — E875 Hyperkalemia: Secondary | ICD-10-CM | POA: Diagnosis not present

## 2017-10-05 ENCOUNTER — Ambulatory Visit (INDEPENDENT_AMBULATORY_CARE_PROVIDER_SITE_OTHER): Payer: Medicare Other | Admitting: *Deleted

## 2017-10-05 DIAGNOSIS — Z5181 Encounter for therapeutic drug level monitoring: Secondary | ICD-10-CM

## 2017-10-05 DIAGNOSIS — I482 Chronic atrial fibrillation: Secondary | ICD-10-CM

## 2017-10-05 DIAGNOSIS — I4821 Permanent atrial fibrillation: Secondary | ICD-10-CM

## 2017-10-05 LAB — POCT INR: INR: 1.7

## 2017-10-05 NOTE — Patient Instructions (Signed)
Increase coumadin to 1 1/2 tablets daily except 1 tablet on Sundays and Wednesdays Recheck in 2 weeks

## 2017-10-10 ENCOUNTER — Other Ambulatory Visit: Payer: Self-pay | Admitting: Cardiology

## 2017-10-10 NOTE — Telephone Encounter (Signed)
Request for amlodipine has been sent by Vilinda Blanks.

## 2017-10-19 ENCOUNTER — Ambulatory Visit (INDEPENDENT_AMBULATORY_CARE_PROVIDER_SITE_OTHER): Payer: Medicare Other | Admitting: *Deleted

## 2017-10-19 DIAGNOSIS — Z5181 Encounter for therapeutic drug level monitoring: Secondary | ICD-10-CM | POA: Diagnosis not present

## 2017-10-19 DIAGNOSIS — I482 Chronic atrial fibrillation: Secondary | ICD-10-CM

## 2017-10-19 DIAGNOSIS — I4821 Permanent atrial fibrillation: Secondary | ICD-10-CM

## 2017-10-19 LAB — POCT INR: INR: 2.4

## 2017-10-19 NOTE — Patient Instructions (Signed)
Continue 1 1/2 tablets daily except 1 tablet on Sundays and Wednesdays Recheck in 3 weeks

## 2017-11-09 ENCOUNTER — Ambulatory Visit (INDEPENDENT_AMBULATORY_CARE_PROVIDER_SITE_OTHER): Payer: Medicare Other | Admitting: *Deleted

## 2017-11-09 DIAGNOSIS — I482 Chronic atrial fibrillation: Secondary | ICD-10-CM | POA: Diagnosis not present

## 2017-11-09 DIAGNOSIS — Z5181 Encounter for therapeutic drug level monitoring: Secondary | ICD-10-CM | POA: Diagnosis not present

## 2017-11-09 DIAGNOSIS — I4821 Permanent atrial fibrillation: Secondary | ICD-10-CM

## 2017-11-09 LAB — POCT INR: INR: 2.1

## 2017-11-09 NOTE — Patient Instructions (Signed)
Continue 1 1/2 tablets daily except 1 tablet on Sundays and Wednesdays Recheck in 4 weeks

## 2017-11-10 ENCOUNTER — Other Ambulatory Visit: Payer: Self-pay | Admitting: Cardiology

## 2017-12-01 ENCOUNTER — Ambulatory Visit: Payer: Medicare Other | Admitting: Cardiology

## 2017-12-07 ENCOUNTER — Ambulatory Visit (INDEPENDENT_AMBULATORY_CARE_PROVIDER_SITE_OTHER): Payer: Medicare Other | Admitting: *Deleted

## 2017-12-07 DIAGNOSIS — I482 Chronic atrial fibrillation: Secondary | ICD-10-CM | POA: Diagnosis not present

## 2017-12-07 DIAGNOSIS — Z5181 Encounter for therapeutic drug level monitoring: Secondary | ICD-10-CM

## 2017-12-07 DIAGNOSIS — I4821 Permanent atrial fibrillation: Secondary | ICD-10-CM

## 2017-12-07 LAB — POCT INR: INR: 2.2

## 2017-12-07 NOTE — Patient Instructions (Signed)
Continue 1 1/2 tablets daily except 1 tablet on Sundays and Wednesdays Recheck in 4 weeks

## 2017-12-12 ENCOUNTER — Other Ambulatory Visit: Payer: Self-pay | Admitting: Cardiology

## 2018-01-04 ENCOUNTER — Ambulatory Visit (INDEPENDENT_AMBULATORY_CARE_PROVIDER_SITE_OTHER): Payer: Medicare Other | Admitting: *Deleted

## 2018-01-04 DIAGNOSIS — I4821 Permanent atrial fibrillation: Secondary | ICD-10-CM

## 2018-01-04 DIAGNOSIS — I482 Chronic atrial fibrillation: Secondary | ICD-10-CM

## 2018-01-04 DIAGNOSIS — Z5181 Encounter for therapeutic drug level monitoring: Secondary | ICD-10-CM | POA: Diagnosis not present

## 2018-01-04 LAB — POCT INR: INR: 2.1 (ref 2.0–3.0)

## 2018-01-04 NOTE — Patient Instructions (Signed)
Continue 1 1/2 tablets daily except 1 tablet on Sundays and Wednesdays Recheck in 6 weeks.  

## 2018-01-16 DIAGNOSIS — R739 Hyperglycemia, unspecified: Secondary | ICD-10-CM | POA: Diagnosis not present

## 2018-01-16 DIAGNOSIS — E7800 Pure hypercholesterolemia, unspecified: Secondary | ICD-10-CM | POA: Diagnosis not present

## 2018-01-16 DIAGNOSIS — I10 Essential (primary) hypertension: Secondary | ICD-10-CM | POA: Diagnosis not present

## 2018-01-16 DIAGNOSIS — N183 Chronic kidney disease, stage 3 (moderate): Secondary | ICD-10-CM | POA: Diagnosis not present

## 2018-01-16 DIAGNOSIS — I482 Chronic atrial fibrillation: Secondary | ICD-10-CM | POA: Diagnosis not present

## 2018-01-16 DIAGNOSIS — E782 Mixed hyperlipidemia: Secondary | ICD-10-CM | POA: Diagnosis not present

## 2018-01-16 DIAGNOSIS — G459 Transient cerebral ischemic attack, unspecified: Secondary | ICD-10-CM | POA: Diagnosis not present

## 2018-01-16 DIAGNOSIS — E875 Hyperkalemia: Secondary | ICD-10-CM | POA: Diagnosis not present

## 2018-01-22 DIAGNOSIS — Z6824 Body mass index (BMI) 24.0-24.9, adult: Secondary | ICD-10-CM | POA: Diagnosis not present

## 2018-01-22 DIAGNOSIS — E782 Mixed hyperlipidemia: Secondary | ICD-10-CM | POA: Diagnosis not present

## 2018-01-22 DIAGNOSIS — N183 Chronic kidney disease, stage 3 (moderate): Secondary | ICD-10-CM | POA: Diagnosis not present

## 2018-01-22 DIAGNOSIS — Z1331 Encounter for screening for depression: Secondary | ICD-10-CM | POA: Diagnosis not present

## 2018-01-22 DIAGNOSIS — I482 Chronic atrial fibrillation: Secondary | ICD-10-CM | POA: Diagnosis not present

## 2018-01-22 DIAGNOSIS — F5101 Primary insomnia: Secondary | ICD-10-CM | POA: Diagnosis not present

## 2018-01-22 DIAGNOSIS — Z00110 Health examination for newborn under 8 days old: Secondary | ICD-10-CM | POA: Diagnosis not present

## 2018-01-22 DIAGNOSIS — Z1389 Encounter for screening for other disorder: Secondary | ICD-10-CM | POA: Diagnosis not present

## 2018-01-22 DIAGNOSIS — I10 Essential (primary) hypertension: Secondary | ICD-10-CM | POA: Diagnosis not present

## 2018-01-22 DIAGNOSIS — I2510 Atherosclerotic heart disease of native coronary artery without angina pectoris: Secondary | ICD-10-CM | POA: Diagnosis not present

## 2018-01-23 DIAGNOSIS — S338XXA Sprain of other parts of lumbar spine and pelvis, initial encounter: Secondary | ICD-10-CM | POA: Diagnosis not present

## 2018-01-23 DIAGNOSIS — M47816 Spondylosis without myelopathy or radiculopathy, lumbar region: Secondary | ICD-10-CM | POA: Diagnosis not present

## 2018-01-23 DIAGNOSIS — M9903 Segmental and somatic dysfunction of lumbar region: Secondary | ICD-10-CM | POA: Diagnosis not present

## 2018-01-25 DIAGNOSIS — M47816 Spondylosis without myelopathy or radiculopathy, lumbar region: Secondary | ICD-10-CM | POA: Diagnosis not present

## 2018-01-25 DIAGNOSIS — M9903 Segmental and somatic dysfunction of lumbar region: Secondary | ICD-10-CM | POA: Diagnosis not present

## 2018-01-25 DIAGNOSIS — S338XXA Sprain of other parts of lumbar spine and pelvis, initial encounter: Secondary | ICD-10-CM | POA: Diagnosis not present

## 2018-02-07 NOTE — Progress Notes (Signed)
Cardiology Office Note  Date: 02/08/2018   ID: DAMARRION MIMBS, DOB September 06, 1930, MRN [id number]  PCP: Manon Hilding, MD  Primary Cardiologist: Rozann Lesches, MD   Chief Complaint  Patient presents with  . Atrial Fibrillation  . Coronary Artery Disease    History of Present Illness: Benjamin Mason is an 84 y.o. male last seen in October 2018.  He is here for a routine visit.  Reports no chest pain or palpitations, remains functional with ADLs.  He describes NYHA class II dyspnea, no dizziness or syncope.  He remains on Coumadin with follow-up in the anticoagulation clinic.  Recent INR was 2.1.  He has had no spontaneous bleeding problems.  We went over his medications today.  He reports no intolerances.  We discussed stopping aspirin at this point, he has been stable from a cardiovascular perspective.  Past Medical History:  Diagnosis Date  . Anxiety   . Aortic regurgitation    Mild to moderate  . CKD (chronic kidney disease) stage 3, GFR 30-59 ml/min (HCC)   . Coronary atherosclerosis of native coronary artery    BMS to RCA and LAD 1995  . Essential hypertension, benign   . IBS (irritable bowel syndrome)   . Lactose intolerance   . Mitral regurgitation    Moderate  . Mixed hyperlipidemia   . Permanent atrial fibrillation [facility])     Past Surgical History:  Procedure Laterality Date  . BALLOON DILATION  08/03/2012   Procedure: BALLOON DILATION;  Surgeon: Rogene Houston, MD;  [facility];  Service: Endoscopy;  Laterality: N/A;  . COLONOSCOPY WITH ESOPHAGOGASTRODUODENOSCOPY (EGD)  08/03/2012   Procedure: COLONOSCOPY WITH ESOPHAGOGASTRODUODENOSCOPY (EGD);  Surgeon: Rogene Houston, MD;  [facility];  Service: Endoscopy;  Laterality: N/A;  215  . LEFT HEART CATH AND CORONARY ANGIOGRAPHY N/A 11/18/2016   Procedure: Left Heart Cath and Coronary Angiography;  Surgeon: Peter M Martinique, MD;  Location: [facility];  Service: Cardiovascular;   Laterality: N/A;  . LEFT INGUINAL HERNIA    . MALONEY DILATION  08/03/2012   Procedure: MALONEY DILATION;  Surgeon: Rogene Houston, MD;  [facility];  Service: Endoscopy;  Laterality: N/A;  . PENILE PROSTHESIS IMPLANT    . SAVORY DILATION  08/03/2012   Procedure: SAVORY DILATION;  Surgeon: Rogene Houston, MD;  [facility];  Service: Endoscopy;  Laterality: N/A;    Current Outpatient Medications  Medication Sig Dispense Refill  . acetaminophen (TYLENOL ARTHRITIS PAIN) 650 MG CR tablet Take 1,300 mg by mouth 2 (two) times daily.     Marland Kitchen amLODipine (NORVASC) 2.5 MG tablet TAKE ONE TABLET BY MOUTH DAILY 90 tablet 3  . Apoaequorin (PREVAGEN) 10 MG CAPS Take by mouth daily.    Marland Kitchen aspirin 81 MG tablet Take 81 mg by mouth daily.    Marland Kitchen atenolol (TENORMIN) 50 MG tablet TAKE ONE TABLET BY MOUTH ONCE DAILY 90 tablet 3  . finasteride (PROSCAR) 5 MG tablet Take 5 mg by mouth daily.      . Lactase 9000 units CHEW Chew 1 tablet by mouth See admin instructions. Pt takes only as needed when eating any dairy products    . MELATONIN PO Take 5 mg by mouth at bedtime.     Marland Kitchen NITROSTAT 0.4 MG SL tablet PLACE ONE TABLET UNDER THE TONGUE EVERY 5 MINUTES AS NEEDED FOR CHEST PAIN 25 tablet 3  . Probiotic Product ([place])  Take by mouth. This is Ultraflora IB - He takes PO 4 times weekly.    . simvastatin (ZOCOR) 10 MG tablet Take 20 mg by mouth every evening.     . Tamsulosin HCl (FLOMAX) 0.4 MG CAPS Take 0.4 mg by mouth daily.      . Testosterone Cypionate 200 MG/ML KIT Inject 100-150 mg into the muscle every 14 (fourteen) days. For IM use only.   Pt uses 0.5 to 0.7 mL    . warfarin (COUMADIN) 3 MG tablet TAKE AS DIRECTED BY COUMADIN CLINIC 135 tablet 3   No current facility-administered medications for this visit.    Allergies:  Patient has no known allergies.   Social History: The patient  reports that he quit smoking about 55 years ago. His smoking use included  cigarettes. He has a 50.00 pack-year smoking history. He has never used smokeless tobacco. He reports that he does not drink alcohol or use drugs.   ROS:  Please see the history of present illness. Otherwise, complete review of systems is positive for none.  All other systems are reviewed and negative.   Physical Exam: VS:  BP 112/68   Pulse 63   Ht 6' (1.829 m)   Wt 181 lb (82.1 kg)   SpO2 98%   BMI 24.55 kg/m , BMI Body mass index is 24.55 kg/m.  Wt Readings from Last 3 Encounters:  02/08/18 181 lb (82.1 kg)  06/12/17 181 lb (82.1 kg)  12/14/16 180 lb (81.6 kg)    General: Elderly male, appears comfortable at rest. HEENT: Conjunctiva and lids normal, oropharynx clear. Neck: Supple, no elevated JVP or carotid bruits, no thyromegaly. Lungs: Clear to auscultation, nonlabored breathing at rest. Cardiac: Irregularly irregular, no S3, soft apical systolic murmur, no pericardial rub. Abdomen: Soft, nontender, bowel sounds present. Extremities: No pitting edema, distal pulses 2+. Skin: Warm and dry. Musculoskeletal: No kyphosis. Neuropsychiatric: Alert and oriented x3, affect grossly appropriate.  ECG: I personally reviewed the tracing from 11/18/2016 which showed rate-controlled atrial fibrillation.  Recent Labwork:  11/18/2016: BUN 39; Creatinine, Ser 1.92; Hemoglobin 13.9; Platelets 154; Potassium 4.8; Sodium 138   Other Studies Reviewed Today:  Echocardiogram 01/11/2017: Study Conclusions  - Left ventricle: The cavity size was normal. Wall thickness was normal. Systolic function was normal. The estimated ejection fraction was in the range of 50% to 55%. Wall motion was normal; there were no regional wall motion abnormalities. The study was not technically sufficient to allow evaluation of LV diastolic dysfunction due to atrial fibrillation. - Aortic valve: Trileaflet; mildly thickened, mildly calcified leaflets. There was no stenosis. There was  moderate regurgitation. - Mitral valve: Mildly thickened leaflets . There were two regurgitant jets (one mild to moderate in severity, the other mild), leading to overall moderate regurgitation. - Left atrium: The atrium was moderately dilated. - Right atrium: The atrium was moderately dilated. - Tricuspid valve: Mildly thickened leaflets. There was moderate regurgitation. - Pulmonic valve: There was mild regurgitation. - Pulmonary arteries: Inadequate tricuspid regurgitant jet to accurately assess pulmonary pressures.  Cardiac catheterization 11/18/2016:  Ost RCA to Mid RCA lesion, 25 %stenosed.  Dist RCA lesion, 45 %stenosed.  Ost LAD to Prox LAD lesion, 0 %stenosed.  Prox LAD to Mid LAD lesion, 35 %stenosed.  LV end diastolic pressure is normal.  Mid LAD to Dist LAD lesion, 35 %stenosed.  1. Nonobstructive CAD 2. Normal LVEDP  Assessment and Plan:  1.  Permanent atrial fibrillation.  He is doing well without any significant  palpitations and continues on atenolol as well as Coumadin.  2.  Mild to moderate nonobstructive CAD without active angina symptoms.  Plan is to stop aspirin at this time in light of concurrent use of Coumadin.  Otherwise continue statin therapy.  3.  Essential hypertension, blood pressure is well controlled today.  He continues on Norvasc.  4.  Valvular heart disease including moderate aortic, mitral, and tricuspid regurgitation.  Anticipate conservative management.   Current medicines were reviewed with the patient today.   Orders Placed This Encounter  Procedures  . EKG 12-Lead    Disposition: Follow-up in 6 months.  Signed, Satira Sark, MD, [facility] 02/08/2018 9:45 AM    [facility] [place], [place], [place] [address] Phone: [phone]; Fax: [phone]

## 2018-02-08 ENCOUNTER — Ambulatory Visit (INDEPENDENT_AMBULATORY_CARE_PROVIDER_SITE_OTHER): Payer: Medicare Other | Admitting: Cardiology

## 2018-02-08 ENCOUNTER — Encounter: Payer: Self-pay | Admitting: Cardiology

## 2018-02-08 VITALS — BP 112/68 | HR 63 | Ht 72.0 in | Wt 181.0 lb

## 2018-02-08 DIAGNOSIS — I38 Endocarditis, valve unspecified: Secondary | ICD-10-CM | POA: Diagnosis not present

## 2018-02-08 DIAGNOSIS — I482 Chronic atrial fibrillation: Secondary | ICD-10-CM | POA: Diagnosis not present

## 2018-02-08 DIAGNOSIS — I4821 Permanent atrial fibrillation: Secondary | ICD-10-CM

## 2018-02-08 DIAGNOSIS — I25119 Atherosclerotic heart disease of native coronary artery with unspecified angina pectoris: Secondary | ICD-10-CM | POA: Diagnosis not present

## 2018-02-08 DIAGNOSIS — I10 Essential (primary) hypertension: Secondary | ICD-10-CM | POA: Diagnosis not present

## 2018-02-08 NOTE — Patient Instructions (Addendum)
Medication Instructions:  Your physician has recommended you make the following change in your medication:    STOP Aspirin   Please continue all other medications as prescribed  Labwork: NONE  Testing/Procedures: NONE  Follow-Up: Your physician wants you to follow-up in: [place]. You will receive a reminder letter in the mail two months in advance. If you don't receive a letter, please call our office to schedule the follow-up appointment.  Any Other Special Instructions Will Be Listed Below (If Applicable).  If you need a refill on your cardiac medications before your next appointment, please call your pharmacy.

## 2018-02-16 ENCOUNTER — Ambulatory Visit (INDEPENDENT_AMBULATORY_CARE_PROVIDER_SITE_OTHER): Payer: Medicare Other | Admitting: *Deleted

## 2018-02-16 DIAGNOSIS — I482 Chronic atrial fibrillation: Secondary | ICD-10-CM | POA: Diagnosis not present

## 2018-02-16 DIAGNOSIS — I4821 Permanent atrial fibrillation: Secondary | ICD-10-CM

## 2018-02-16 DIAGNOSIS — Z5181 Encounter for therapeutic drug level monitoring: Secondary | ICD-10-CM | POA: Diagnosis not present

## 2018-02-16 LAB — POCT INR: INR: 2.5 (ref 2.0–3.0)

## 2018-02-16 NOTE — Patient Instructions (Signed)
Continue 1 1/2 tablets daily except 1 tablet on Sundays and Wednesdays Recheck in 6 weeks.  

## 2018-03-09 DIAGNOSIS — M47816 Spondylosis without myelopathy or radiculopathy, lumbar region: Secondary | ICD-10-CM | POA: Diagnosis not present

## 2018-03-09 DIAGNOSIS — S338XXA Sprain of other parts of lumbar spine and pelvis, initial encounter: Secondary | ICD-10-CM | POA: Diagnosis not present

## 2018-03-09 DIAGNOSIS — M9903 Segmental and somatic dysfunction of lumbar region: Secondary | ICD-10-CM | POA: Diagnosis not present

## 2018-03-12 DIAGNOSIS — M9903 Segmental and somatic dysfunction of lumbar region: Secondary | ICD-10-CM | POA: Diagnosis not present

## 2018-03-12 DIAGNOSIS — S338XXA Sprain of other parts of lumbar spine and pelvis, initial encounter: Secondary | ICD-10-CM | POA: Diagnosis not present

## 2018-03-12 DIAGNOSIS — M47816 Spondylosis without myelopathy or radiculopathy, lumbar region: Secondary | ICD-10-CM | POA: Diagnosis not present

## 2018-03-14 DIAGNOSIS — S338XXA Sprain of other parts of lumbar spine and pelvis, initial encounter: Secondary | ICD-10-CM | POA: Diagnosis not present

## 2018-03-14 DIAGNOSIS — M9903 Segmental and somatic dysfunction of lumbar region: Secondary | ICD-10-CM | POA: Diagnosis not present

## 2018-03-14 DIAGNOSIS — M47816 Spondylosis without myelopathy or radiculopathy, lumbar region: Secondary | ICD-10-CM | POA: Diagnosis not present

## 2018-03-16 DIAGNOSIS — S338XXA Sprain of other parts of lumbar spine and pelvis, initial encounter: Secondary | ICD-10-CM | POA: Diagnosis not present

## 2018-03-16 DIAGNOSIS — M9903 Segmental and somatic dysfunction of lumbar region: Secondary | ICD-10-CM | POA: Diagnosis not present

## 2018-03-16 DIAGNOSIS — M47816 Spondylosis without myelopathy or radiculopathy, lumbar region: Secondary | ICD-10-CM | POA: Diagnosis not present

## 2018-03-22 DIAGNOSIS — H5201 Hypermetropia, right eye: Secondary | ICD-10-CM | POA: Diagnosis not present

## 2018-03-22 DIAGNOSIS — Z961 Presence of intraocular lens: Secondary | ICD-10-CM | POA: Diagnosis not present

## 2018-03-28 DIAGNOSIS — R809 Proteinuria, unspecified: Secondary | ICD-10-CM | POA: Diagnosis not present

## 2018-03-28 DIAGNOSIS — I129 Hypertensive chronic kidney disease with stage 1 through stage 4 chronic kidney disease, or unspecified chronic kidney disease: Secondary | ICD-10-CM | POA: Diagnosis not present

## 2018-03-28 DIAGNOSIS — Z79899 Other long term (current) drug therapy: Secondary | ICD-10-CM | POA: Diagnosis not present

## 2018-03-28 DIAGNOSIS — E559 Vitamin D deficiency, unspecified: Secondary | ICD-10-CM | POA: Diagnosis not present

## 2018-03-28 DIAGNOSIS — D509 Iron deficiency anemia, unspecified: Secondary | ICD-10-CM | POA: Diagnosis not present

## 2018-03-28 DIAGNOSIS — N184 Chronic kidney disease, stage 4 (severe): Secondary | ICD-10-CM | POA: Diagnosis not present

## 2018-04-03 ENCOUNTER — Ambulatory Visit (INDEPENDENT_AMBULATORY_CARE_PROVIDER_SITE_OTHER): Payer: Medicare Other | Admitting: Pharmacist

## 2018-04-03 DIAGNOSIS — I482 Chronic atrial fibrillation: Secondary | ICD-10-CM | POA: Diagnosis not present

## 2018-04-03 DIAGNOSIS — Z5181 Encounter for therapeutic drug level monitoring: Secondary | ICD-10-CM

## 2018-04-03 DIAGNOSIS — I4821 Permanent atrial fibrillation: Secondary | ICD-10-CM | POA: Insufficient documentation

## 2018-04-03 LAB — POCT INR: INR: 3 (ref 2.0–3.0)

## 2018-04-03 NOTE — Patient Instructions (Signed)
Description   Skip tomorrow then continue 1 1/2 tablets daily except 1 tablet on Sundays and Wednesdays Recheck in 4 weeks

## 2018-04-11 DIAGNOSIS — M47816 Spondylosis without myelopathy or radiculopathy, lumbar region: Secondary | ICD-10-CM | POA: Diagnosis not present

## 2018-04-11 DIAGNOSIS — S338XXA Sprain of other parts of lumbar spine and pelvis, initial encounter: Secondary | ICD-10-CM | POA: Diagnosis not present

## 2018-04-11 DIAGNOSIS — M9903 Segmental and somatic dysfunction of lumbar region: Secondary | ICD-10-CM | POA: Diagnosis not present

## 2018-04-13 DIAGNOSIS — M47816 Spondylosis without myelopathy or radiculopathy, lumbar region: Secondary | ICD-10-CM | POA: Diagnosis not present

## 2018-04-13 DIAGNOSIS — S338XXA Sprain of other parts of lumbar spine and pelvis, initial encounter: Secondary | ICD-10-CM | POA: Diagnosis not present

## 2018-04-13 DIAGNOSIS — M9903 Segmental and somatic dysfunction of lumbar region: Secondary | ICD-10-CM | POA: Diagnosis not present

## 2018-04-20 DIAGNOSIS — S338XXA Sprain of other parts of lumbar spine and pelvis, initial encounter: Secondary | ICD-10-CM | POA: Diagnosis not present

## 2018-04-20 DIAGNOSIS — M47816 Spondylosis without myelopathy or radiculopathy, lumbar region: Secondary | ICD-10-CM | POA: Diagnosis not present

## 2018-04-20 DIAGNOSIS — M9903 Segmental and somatic dysfunction of lumbar region: Secondary | ICD-10-CM | POA: Diagnosis not present

## 2018-04-26 DIAGNOSIS — M47816 Spondylosis without myelopathy or radiculopathy, lumbar region: Secondary | ICD-10-CM | POA: Diagnosis not present

## 2018-04-26 DIAGNOSIS — S338XXA Sprain of other parts of lumbar spine and pelvis, initial encounter: Secondary | ICD-10-CM | POA: Diagnosis not present

## 2018-04-26 DIAGNOSIS — M9903 Segmental and somatic dysfunction of lumbar region: Secondary | ICD-10-CM | POA: Diagnosis not present

## 2018-05-01 ENCOUNTER — Ambulatory Visit (INDEPENDENT_AMBULATORY_CARE_PROVIDER_SITE_OTHER): Payer: Medicare Other | Admitting: *Deleted

## 2018-05-01 DIAGNOSIS — I4821 Permanent atrial fibrillation: Secondary | ICD-10-CM

## 2018-05-01 DIAGNOSIS — I482 Chronic atrial fibrillation: Secondary | ICD-10-CM

## 2018-05-01 DIAGNOSIS — Z5181 Encounter for therapeutic drug level monitoring: Secondary | ICD-10-CM

## 2018-05-01 LAB — POCT INR: INR: 2.4 (ref 2.0–3.0)

## 2018-05-01 NOTE — Patient Instructions (Signed)
Continue coumadin 1 1/2 tablets daily except 1 tablet on Sundays and Wednesdays. Recheck in 4 weeks. 

## 2018-05-03 DIAGNOSIS — M9903 Segmental and somatic dysfunction of lumbar region: Secondary | ICD-10-CM | POA: Diagnosis not present

## 2018-05-03 DIAGNOSIS — M47816 Spondylosis without myelopathy or radiculopathy, lumbar region: Secondary | ICD-10-CM | POA: Diagnosis not present

## 2018-05-03 DIAGNOSIS — S338XXA Sprain of other parts of lumbar spine and pelvis, initial encounter: Secondary | ICD-10-CM | POA: Diagnosis not present

## 2018-05-09 DIAGNOSIS — M47816 Spondylosis without myelopathy or radiculopathy, lumbar region: Secondary | ICD-10-CM | POA: Diagnosis not present

## 2018-05-09 DIAGNOSIS — S338XXA Sprain of other parts of lumbar spine and pelvis, initial encounter: Secondary | ICD-10-CM | POA: Diagnosis not present

## 2018-05-09 DIAGNOSIS — M9903 Segmental and somatic dysfunction of lumbar region: Secondary | ICD-10-CM | POA: Diagnosis not present

## 2018-05-14 DIAGNOSIS — M47816 Spondylosis without myelopathy or radiculopathy, lumbar region: Secondary | ICD-10-CM | POA: Diagnosis not present

## 2018-05-14 DIAGNOSIS — M9903 Segmental and somatic dysfunction of lumbar region: Secondary | ICD-10-CM | POA: Diagnosis not present

## 2018-05-14 DIAGNOSIS — S338XXA Sprain of other parts of lumbar spine and pelvis, initial encounter: Secondary | ICD-10-CM | POA: Diagnosis not present

## 2018-05-18 DIAGNOSIS — S338XXA Sprain of other parts of lumbar spine and pelvis, initial encounter: Secondary | ICD-10-CM | POA: Diagnosis not present

## 2018-05-18 DIAGNOSIS — M9903 Segmental and somatic dysfunction of lumbar region: Secondary | ICD-10-CM | POA: Diagnosis not present

## 2018-05-18 DIAGNOSIS — M47816 Spondylosis without myelopathy or radiculopathy, lumbar region: Secondary | ICD-10-CM | POA: Diagnosis not present

## 2018-05-25 DIAGNOSIS — M47816 Spondylosis without myelopathy or radiculopathy, lumbar region: Secondary | ICD-10-CM | POA: Diagnosis not present

## 2018-05-25 DIAGNOSIS — M9903 Segmental and somatic dysfunction of lumbar region: Secondary | ICD-10-CM | POA: Diagnosis not present

## 2018-05-25 DIAGNOSIS — S338XXA Sprain of other parts of lumbar spine and pelvis, initial encounter: Secondary | ICD-10-CM | POA: Diagnosis not present

## 2018-05-29 ENCOUNTER — Ambulatory Visit (INDEPENDENT_AMBULATORY_CARE_PROVIDER_SITE_OTHER): Payer: Medicare Other | Admitting: *Deleted

## 2018-05-29 DIAGNOSIS — I4821 Permanent atrial fibrillation: Secondary | ICD-10-CM

## 2018-05-29 DIAGNOSIS — Z5181 Encounter for therapeutic drug level monitoring: Secondary | ICD-10-CM

## 2018-05-29 LAB — POCT INR: INR: 2.1 (ref 2.0–3.0)

## 2018-05-29 NOTE — Patient Instructions (Signed)
Continue coumadin 1 1/2 tablets daily except 1 tablet on Sundays and Wednesdays Recheck in 5 weeks

## 2018-05-31 DIAGNOSIS — M47816 Spondylosis without myelopathy or radiculopathy, lumbar region: Secondary | ICD-10-CM | POA: Diagnosis not present

## 2018-05-31 DIAGNOSIS — S338XXA Sprain of other parts of lumbar spine and pelvis, initial encounter: Secondary | ICD-10-CM | POA: Diagnosis not present

## 2018-05-31 DIAGNOSIS — M9903 Segmental and somatic dysfunction of lumbar region: Secondary | ICD-10-CM | POA: Diagnosis not present

## 2018-06-25 DIAGNOSIS — M47816 Spondylosis without myelopathy or radiculopathy, lumbar region: Secondary | ICD-10-CM | POA: Diagnosis not present

## 2018-06-25 DIAGNOSIS — Z23 Encounter for immunization: Secondary | ICD-10-CM | POA: Diagnosis not present

## 2018-06-25 DIAGNOSIS — M9903 Segmental and somatic dysfunction of lumbar region: Secondary | ICD-10-CM | POA: Diagnosis not present

## 2018-06-25 DIAGNOSIS — S338XXA Sprain of other parts of lumbar spine and pelvis, initial encounter: Secondary | ICD-10-CM | POA: Diagnosis not present

## 2018-07-03 ENCOUNTER — Ambulatory Visit (INDEPENDENT_AMBULATORY_CARE_PROVIDER_SITE_OTHER): Payer: Medicare Other | Admitting: *Deleted

## 2018-07-03 DIAGNOSIS — Z5181 Encounter for therapeutic drug level monitoring: Secondary | ICD-10-CM | POA: Diagnosis not present

## 2018-07-03 DIAGNOSIS — E291 Testicular hypofunction: Secondary | ICD-10-CM | POA: Diagnosis not present

## 2018-07-03 DIAGNOSIS — R351 Nocturia: Secondary | ICD-10-CM | POA: Diagnosis not present

## 2018-07-03 DIAGNOSIS — I4821 Permanent atrial fibrillation: Secondary | ICD-10-CM | POA: Diagnosis not present

## 2018-07-03 DIAGNOSIS — N401 Enlarged prostate with lower urinary tract symptoms: Secondary | ICD-10-CM | POA: Diagnosis not present

## 2018-07-03 LAB — POCT INR: INR: 2 (ref 2.0–3.0)

## 2018-07-03 NOTE — Patient Instructions (Signed)
Continue coumadin 1 1/2 tablets daily except 1 tablet on Sundays and Wednesdays Recheck in 6 weeks 

## 2018-07-09 DIAGNOSIS — M545 Low back pain: Secondary | ICD-10-CM | POA: Diagnosis not present

## 2018-07-09 DIAGNOSIS — M9903 Segmental and somatic dysfunction of lumbar region: Secondary | ICD-10-CM | POA: Diagnosis not present

## 2018-07-09 DIAGNOSIS — S338XXA Sprain of other parts of lumbar spine and pelvis, initial encounter: Secondary | ICD-10-CM | POA: Diagnosis not present

## 2018-07-17 DIAGNOSIS — E7800 Pure hypercholesterolemia, unspecified: Secondary | ICD-10-CM | POA: Diagnosis not present

## 2018-07-17 DIAGNOSIS — N183 Chronic kidney disease, stage 3 (moderate): Secondary | ICD-10-CM | POA: Diagnosis not present

## 2018-07-17 DIAGNOSIS — I10 Essential (primary) hypertension: Secondary | ICD-10-CM | POA: Diagnosis not present

## 2018-07-17 DIAGNOSIS — R739 Hyperglycemia, unspecified: Secondary | ICD-10-CM | POA: Diagnosis not present

## 2018-07-17 DIAGNOSIS — E782 Mixed hyperlipidemia: Secondary | ICD-10-CM | POA: Diagnosis not present

## 2018-07-20 DIAGNOSIS — I10 Essential (primary) hypertension: Secondary | ICD-10-CM | POA: Diagnosis not present

## 2018-07-20 DIAGNOSIS — I4891 Unspecified atrial fibrillation: Secondary | ICD-10-CM | POA: Diagnosis not present

## 2018-07-20 DIAGNOSIS — I2510 Atherosclerotic heart disease of native coronary artery without angina pectoris: Secondary | ICD-10-CM | POA: Diagnosis not present

## 2018-07-20 DIAGNOSIS — G3184 Mild cognitive impairment, so stated: Secondary | ICD-10-CM | POA: Diagnosis not present

## 2018-07-20 DIAGNOSIS — Z6824 Body mass index (BMI) 24.0-24.9, adult: Secondary | ICD-10-CM | POA: Diagnosis not present

## 2018-07-20 DIAGNOSIS — E782 Mixed hyperlipidemia: Secondary | ICD-10-CM | POA: Diagnosis not present

## 2018-07-20 DIAGNOSIS — K210 Gastro-esophageal reflux disease with esophagitis: Secondary | ICD-10-CM | POA: Diagnosis not present

## 2018-07-20 DIAGNOSIS — N183 Chronic kidney disease, stage 3 (moderate): Secondary | ICD-10-CM | POA: Diagnosis not present

## 2018-07-23 DIAGNOSIS — M9903 Segmental and somatic dysfunction of lumbar region: Secondary | ICD-10-CM | POA: Diagnosis not present

## 2018-07-23 DIAGNOSIS — M47816 Spondylosis without myelopathy or radiculopathy, lumbar region: Secondary | ICD-10-CM | POA: Diagnosis not present

## 2018-07-23 DIAGNOSIS — S338XXA Sprain of other parts of lumbar spine and pelvis, initial encounter: Secondary | ICD-10-CM | POA: Diagnosis not present

## 2018-07-30 DIAGNOSIS — M9903 Segmental and somatic dysfunction of lumbar region: Secondary | ICD-10-CM | POA: Diagnosis not present

## 2018-07-30 DIAGNOSIS — M545 Low back pain: Secondary | ICD-10-CM | POA: Diagnosis not present

## 2018-07-30 DIAGNOSIS — S338XXA Sprain of other parts of lumbar spine and pelvis, initial encounter: Secondary | ICD-10-CM | POA: Diagnosis not present

## 2018-08-01 NOTE — Progress Notes (Signed)
Cardiology Office Note  Date: 08/02/2018   ID: Benjamin Mason, DOB [birth year], MRN [id number]  PCP: Manon Hilding, MD  Primary Cardiologist: Rozann Lesches, MD   Chief Complaint  Patient presents with  . Atrial Fibrillation    History of Present Illness: Benjamin Mason is an 84 y.o. male last seen in June.  He is here for a routine visit.  He does not describe any change in stamina, no exertional chest pain or palpitations.  He is on Coumadin with follow-up in the anticoagulation clinic.  He has had no spontaneous bleeding problems.  He remains on atenolol for heart rate control and otherwise his blood pressure is also well controlled on Norvasc.  Past Medical History:  Diagnosis Date  . Anxiety   . Aortic regurgitation    Mild to moderate  . CKD (chronic kidney disease) stage 3, GFR 30-59 ml/min (HCC)   . Coronary atherosclerosis of native coronary artery    BMS to RCA and LAD 1995  . Essential hypertension, benign   . IBS (irritable bowel syndrome)   . Lactose intolerance   . Mitral regurgitation    Moderate  . Mixed hyperlipidemia   . Permanent atrial fibrillation     Past Surgical History:  Procedure Laterality Date  . BALLOON DILATION  08/03/2012   Procedure: BALLOON DILATION;  Surgeon: Rogene Houston, MD;  [facility];  Service: Endoscopy;  Laterality: N/A;  . COLONOSCOPY WITH ESOPHAGOGASTRODUODENOSCOPY (EGD)  08/03/2012   Procedure: COLONOSCOPY WITH ESOPHAGOGASTRODUODENOSCOPY (EGD);  Surgeon: Rogene Houston, MD;  Location: [facility];  Service: Endoscopy;  Laterality: N/A;  215  . LEFT HEART CATH AND CORONARY ANGIOGRAPHY N/A 11/18/2016   Procedure: Left Heart Cath and Coronary Angiography;  Surgeon: Peter M Martinique, MD;  Location: [facility];  Service: Cardiovascular;  Laterality: N/A;  . LEFT INGUINAL HERNIA    . MALONEY DILATION  08/03/2012   Procedure: MALONEY DILATION;  Surgeon: Rogene Houston, MD;  [facility]: [facility];   Service: Endoscopy;  Laterality: N/A;  . PENILE PROSTHESIS IMPLANT    . SAVORY DILATION  08/03/2012   Procedure: SAVORY DILATION;  Surgeon: Rogene Houston, MD;  [facility]: [facility];  Service: Endoscopy;  Laterality: N/A;    Current Outpatient Medications  Medication Sig Dispense Refill  . acetaminophen (TYLENOL ARTHRITIS PAIN) 650 MG CR tablet Take 1,300 mg by mouth 2 (two) times daily.     Marland Kitchen amLODipine (NORVASC) 2.5 MG tablet TAKE ONE TABLET BY MOUTH DAILY 90 tablet 3  . Apoaequorin (PREVAGEN) 10 MG CAPS Take by mouth daily.    Marland Kitchen atenolol (TENORMIN) 50 MG tablet TAKE ONE TABLET BY MOUTH ONCE DAILY 90 tablet 3  . finasteride (PROSCAR) 5 MG tablet Take 5 mg by mouth daily.      . Lactase 9000 units CHEW Chew 1 tablet by mouth See admin instructions. Pt takes only as needed when eating any dairy products    . MELATONIN PO Take 5 mg by mouth at bedtime.     Marland Kitchen NITROSTAT 0.4 MG SL tablet PLACE ONE TABLET UNDER THE TONGUE EVERY 5 MINUTES AS NEEDED FOR CHEST PAIN 25 tablet 3  . Probiotic Product ([place]) Take by mouth. This is Ultraflora IB - He takes PO 4 times weekly.    . simvastatin (ZOCOR) 10 MG tablet Take 20 mg by mouth every evening.     . Tamsulosin HCl (FLOMAX) 0.4 MG CAPS  Take 0.4 mg by mouth daily.      . Testosterone Cypionate 200 MG/ML KIT Inject 100-150 mg into the muscle every 14 (fourteen) days. For IM use only.   Pt uses 0.5 to 0.7 mL    . warfarin (COUMADIN) 3 MG tablet TAKE AS DIRECTED BY COUMADIN CLINIC 135 tablet 3   No current facility-administered medications for this visit.    Allergies:  Patient has no known allergies.   Social History: The patient  reports that he quit smoking about 56 years ago. His smoking use included cigarettes. He has a 50.00 pack-year smoking history. He has never used smokeless tobacco. He reports that he does not drink alcohol or use drugs.   ROS:  Please see the history of present illness. Otherwise, complete review  of systems is positive for hearing loss.  All other systems are reviewed and negative.   Physical Exam: VS:  BP 110/82   Pulse 66   Ht 6' (1.829 m)   Wt 187 lb (84.8 kg)   SpO2 94%   BMI 25.36 kg/m , BMI Body mass index is 25.36 kg/m.  Wt Readings from Last 3 Encounters:  08/02/18 187 lb (84.8 kg)  02/08/18 181 lb (82.1 kg)  06/12/17 181 lb (82.1 kg)    General: Elderly male, appears comfortable at rest. HEENT: Conjunctiva and lids normal, oropharynx clear. Neck: Supple, no elevated JVP or carotid bruits, no thyromegaly. Lungs: Clear to auscultation, nonlabored breathing at rest. Cardiac: Irregularly irregular, no S3, soft systolic murmur. Abdomen: Soft, nontender, bowel sounds present. Extremities: No pitting edema, distal pulses 2+.  ECG: I personally reviewed the tracing from 02/08/2018 which showed rate controlled atrial fibrillation.  Recent Labwork:  11/18/2016: BUN 39; Creatinine, Ser 1.92; Hemoglobin 13.9; Platelets 154; Potassium 4.8; Sodium 138  Other Studies Reviewed Today:  Echocardiogram 01/11/2017: Study Conclusions  - Left ventricle: The cavity size was normal. Wall thickness was normal. Systolic function was normal. The estimated ejection fraction was in the range of 50% to 55%. Wall motion was normal; there were no regional wall motion abnormalities. The study was not technically sufficient to allow evaluation of LV diastolic dysfunction due to atrial fibrillation. - Aortic valve: Trileaflet; mildly thickened, mildly calcified leaflets. There was no stenosis. There was moderate regurgitation. - Mitral valve: Mildly thickened leaflets . There were two regurgitant jets (one mild to moderate in severity, the other mild), leading to overall moderate regurgitation. - Left atrium: The atrium was moderately dilated. - Right atrium: The atrium was moderately dilated. - Tricuspid valve: Mildly thickened leaflets. There was  moderate regurgitation. - Pulmonic valve: There was mild regurgitation. - Pulmonary arteries: Inadequate tricuspid regurgitant jet to accurately assess pulmonary pressures.  Cardiac catheterization 11/18/2016:  Ost RCA to Mid RCA lesion, 25 %stenosed.  Dist RCA lesion, 45 %stenosed.  Ost LAD to Prox LAD lesion, 0 %stenosed.  Prox LAD to Mid LAD lesion, 35 %stenosed.  LV end diastolic pressure is normal.  Mid LAD to Dist LAD lesion, 35 %stenosed.  1. Nonobstructive CAD 2. Normal LVEDP  Assessment and Plan:  1.  Permanent atrial fibrillation.  He is asymptomatic at this time.  Continue on atenolol and Coumadin.  2.  Nonobstructive CAD by previous work-up.  He reports no active angina symptoms.  Continue statin therapy and observation.  Current medicines were reviewed with the patient today.  Disposition: Follow-up in 6 months.  Signed, Satira Sark, MD, [facility] 08/02/2018 9:00 AM    [facility]  at [place] [place], [place], [place] [address] Phone: [phone]; Fax: [phone]

## 2018-08-02 ENCOUNTER — Encounter: Payer: Self-pay | Admitting: Cardiology

## 2018-08-02 ENCOUNTER — Ambulatory Visit (INDEPENDENT_AMBULATORY_CARE_PROVIDER_SITE_OTHER): Payer: Medicare Other | Admitting: Cardiology

## 2018-08-02 VITALS — BP 110/82 | HR 66 | Ht 72.0 in | Wt 187.0 lb

## 2018-08-02 DIAGNOSIS — I25119 Atherosclerotic heart disease of native coronary artery with unspecified angina pectoris: Secondary | ICD-10-CM

## 2018-08-02 DIAGNOSIS — M9903 Segmental and somatic dysfunction of lumbar region: Secondary | ICD-10-CM | POA: Diagnosis not present

## 2018-08-02 DIAGNOSIS — S338XXA Sprain of other parts of lumbar spine and pelvis, initial encounter: Secondary | ICD-10-CM | POA: Diagnosis not present

## 2018-08-02 DIAGNOSIS — I4821 Permanent atrial fibrillation: Secondary | ICD-10-CM | POA: Diagnosis not present

## 2018-08-02 DIAGNOSIS — M47816 Spondylosis without myelopathy or radiculopathy, lumbar region: Secondary | ICD-10-CM | POA: Diagnosis not present

## 2018-08-02 NOTE — Patient Instructions (Addendum)

## 2018-08-03 DIAGNOSIS — M9903 Segmental and somatic dysfunction of lumbar region: Secondary | ICD-10-CM | POA: Diagnosis not present

## 2018-08-03 DIAGNOSIS — S338XXA Sprain of other parts of lumbar spine and pelvis, initial encounter: Secondary | ICD-10-CM | POA: Diagnosis not present

## 2018-08-03 DIAGNOSIS — M47816 Spondylosis without myelopathy or radiculopathy, lumbar region: Secondary | ICD-10-CM | POA: Diagnosis not present

## 2018-08-06 DIAGNOSIS — M9903 Segmental and somatic dysfunction of lumbar region: Secondary | ICD-10-CM | POA: Diagnosis not present

## 2018-08-06 DIAGNOSIS — S338XXA Sprain of other parts of lumbar spine and pelvis, initial encounter: Secondary | ICD-10-CM | POA: Diagnosis not present

## 2018-08-06 DIAGNOSIS — M47816 Spondylosis without myelopathy or radiculopathy, lumbar region: Secondary | ICD-10-CM | POA: Diagnosis not present

## 2018-08-10 DIAGNOSIS — S338XXA Sprain of other parts of lumbar spine and pelvis, initial encounter: Secondary | ICD-10-CM | POA: Diagnosis not present

## 2018-08-10 DIAGNOSIS — M9903 Segmental and somatic dysfunction of lumbar region: Secondary | ICD-10-CM | POA: Diagnosis not present

## 2018-08-10 DIAGNOSIS — M47816 Spondylosis without myelopathy or radiculopathy, lumbar region: Secondary | ICD-10-CM | POA: Diagnosis not present

## 2018-08-13 DIAGNOSIS — S338XXA Sprain of other parts of lumbar spine and pelvis, initial encounter: Secondary | ICD-10-CM | POA: Diagnosis not present

## 2018-08-13 DIAGNOSIS — M9903 Segmental and somatic dysfunction of lumbar region: Secondary | ICD-10-CM | POA: Diagnosis not present

## 2018-08-13 DIAGNOSIS — M47816 Spondylosis without myelopathy or radiculopathy, lumbar region: Secondary | ICD-10-CM | POA: Diagnosis not present

## 2018-08-16 DIAGNOSIS — M47816 Spondylosis without myelopathy or radiculopathy, lumbar region: Secondary | ICD-10-CM | POA: Diagnosis not present

## 2018-08-16 DIAGNOSIS — M9903 Segmental and somatic dysfunction of lumbar region: Secondary | ICD-10-CM | POA: Diagnosis not present

## 2018-08-20 DIAGNOSIS — M1712 Unilateral primary osteoarthritis, left knee: Secondary | ICD-10-CM | POA: Diagnosis not present

## 2018-08-20 DIAGNOSIS — M25462 Effusion, left knee: Secondary | ICD-10-CM | POA: Diagnosis not present

## 2018-08-20 DIAGNOSIS — I4821 Permanent atrial fibrillation: Secondary | ICD-10-CM | POA: Diagnosis not present

## 2018-08-23 ENCOUNTER — Ambulatory Visit (INDEPENDENT_AMBULATORY_CARE_PROVIDER_SITE_OTHER): Payer: Medicare Other | Admitting: Pharmacist

## 2018-08-23 DIAGNOSIS — Z5181 Encounter for therapeutic drug level monitoring: Secondary | ICD-10-CM

## 2018-08-23 DIAGNOSIS — I4821 Permanent atrial fibrillation: Secondary | ICD-10-CM

## 2018-08-23 LAB — POCT INR: INR: 2.2 (ref 2.0–3.0)

## 2018-08-23 NOTE — Patient Instructions (Signed)
Description   Continue coumadin 1 1/2 tablets daily except 1 tablet on Sundays and Wednesdays Recheck in 6 weeks

## 2018-09-05 DIAGNOSIS — M9903 Segmental and somatic dysfunction of lumbar region: Secondary | ICD-10-CM | POA: Diagnosis not present

## 2018-09-05 DIAGNOSIS — M47816 Spondylosis without myelopathy or radiculopathy, lumbar region: Secondary | ICD-10-CM | POA: Diagnosis not present

## 2018-09-10 DIAGNOSIS — M47816 Spondylosis without myelopathy or radiculopathy, lumbar region: Secondary | ICD-10-CM | POA: Diagnosis not present

## 2018-09-10 DIAGNOSIS — M9903 Segmental and somatic dysfunction of lumbar region: Secondary | ICD-10-CM | POA: Diagnosis not present

## 2018-09-12 ENCOUNTER — Other Ambulatory Visit: Payer: Self-pay | Admitting: Cardiology

## 2018-09-17 DIAGNOSIS — M47816 Spondylosis without myelopathy or radiculopathy, lumbar region: Secondary | ICD-10-CM | POA: Diagnosis not present

## 2018-09-17 DIAGNOSIS — M9903 Segmental and somatic dysfunction of lumbar region: Secondary | ICD-10-CM | POA: Diagnosis not present

## 2018-09-20 DIAGNOSIS — M47816 Spondylosis without myelopathy or radiculopathy, lumbar region: Secondary | ICD-10-CM | POA: Diagnosis not present

## 2018-09-20 DIAGNOSIS — M9903 Segmental and somatic dysfunction of lumbar region: Secondary | ICD-10-CM | POA: Diagnosis not present

## 2018-10-04 ENCOUNTER — Ambulatory Visit (INDEPENDENT_AMBULATORY_CARE_PROVIDER_SITE_OTHER): Payer: Medicare Other | Admitting: *Deleted

## 2018-10-04 DIAGNOSIS — Z5181 Encounter for therapeutic drug level monitoring: Secondary | ICD-10-CM

## 2018-10-04 DIAGNOSIS — I4821 Permanent atrial fibrillation: Secondary | ICD-10-CM

## 2018-10-04 LAB — POCT INR: INR: 2.2 (ref 2.0–3.0)

## 2018-10-04 NOTE — Patient Instructions (Signed)
Continue coumadin 1 1/2 tablets daily except 1 tablet on Sundays and Wednesdays Recheck in 6 weeks 

## 2018-10-15 ENCOUNTER — Other Ambulatory Visit: Payer: Self-pay | Admitting: Cardiology

## 2018-11-12 ENCOUNTER — Other Ambulatory Visit: Payer: Self-pay | Admitting: *Deleted

## 2018-11-12 MED ORDER — ATENOLOL 50 MG PO TABS: 50.0000 mg | ORAL_TABLET | Freq: Every day | ORAL | 3 refills | Status: DC

## 2018-11-14 ENCOUNTER — Telehealth: Payer: Self-pay | Admitting: *Deleted

## 2018-11-14 NOTE — Telephone Encounter (Signed)
° °  _____________   [id number] Pre-Screening Questions:   Do you currently have a fever? (yes = cancel and refer to pcp for e-visit) NO  Have you recently travelled on a cruise, internationally, or to [place], [place], [place], [place], [place], or [place], [place] [employer]) ?  (yes = cancel, stay home, monitor symptoms, and contact pcp or initiate e-visit if symptoms develop) NO  Have you been in contact with someone that is currently pending confirmation of Covid19 testing or has been confirmed to have the [place] virus?  NO     (yes = cancel, stay home, away from tested individual, monitor symptoms, and contact pcp or initiate e-visit if symptoms develop)  NO          Are you currently experiencing fatigue or cough? (yes = pt should be prepared to have a                  mask placed at the time of their visit).NO

## 2018-11-15 ENCOUNTER — Other Ambulatory Visit: Payer: Self-pay

## 2018-11-15 ENCOUNTER — Ambulatory Visit (INDEPENDENT_AMBULATORY_CARE_PROVIDER_SITE_OTHER): Payer: Medicare Other | Admitting: *Deleted

## 2018-11-15 DIAGNOSIS — Z5181 Encounter for therapeutic drug level monitoring: Secondary | ICD-10-CM

## 2018-11-15 DIAGNOSIS — I4821 Permanent atrial fibrillation: Secondary | ICD-10-CM

## 2018-11-15 LAB — POCT INR: INR: 2.4 (ref 2.0–3.0)

## 2018-11-15 NOTE — Patient Instructions (Signed)
Continue coumadin 1 1/2 tablets daily except 1 tablet on Sundays and Wednesdays Recheck in 6 weeks

## 2018-11-28 DIAGNOSIS — M9903 Segmental and somatic dysfunction of lumbar region: Secondary | ICD-10-CM | POA: Diagnosis not present

## 2018-11-28 DIAGNOSIS — M47816 Spondylosis without myelopathy or radiculopathy, lumbar region: Secondary | ICD-10-CM | POA: Diagnosis not present

## 2018-11-30 DIAGNOSIS — M9903 Segmental and somatic dysfunction of lumbar region: Secondary | ICD-10-CM | POA: Diagnosis not present

## 2018-11-30 DIAGNOSIS — M47816 Spondylosis without myelopathy or radiculopathy, lumbar region: Secondary | ICD-10-CM | POA: Diagnosis not present

## 2018-12-20 ENCOUNTER — Telehealth: Payer: Self-pay | Admitting: Pharmacist

## 2018-12-20 NOTE — Telephone Encounter (Signed)
Called patient to discuss potentially switching from warfarin to DOAC to decrease exposure in the office given Coronavirus outbreak. Patient declined and wishes to remain on warfarin at this time. Patient states that he does not mind getting his INR checked every ~6 weeks and would rather stay on warfarin since he has been on it for years.

## 2018-12-26 ENCOUNTER — Telehealth: Payer: Self-pay | Admitting: *Deleted

## 2018-12-26 NOTE — Telephone Encounter (Signed)

## 2018-12-27 ENCOUNTER — Ambulatory Visit (INDEPENDENT_AMBULATORY_CARE_PROVIDER_SITE_OTHER): Payer: Medicare Other | Admitting: *Deleted

## 2018-12-27 DIAGNOSIS — Z5181 Encounter for therapeutic drug level monitoring: Secondary | ICD-10-CM

## 2018-12-27 DIAGNOSIS — I4821 Permanent atrial fibrillation: Secondary | ICD-10-CM | POA: Diagnosis not present

## 2018-12-27 LAB — POCT INR: INR: 2.3 (ref 2.0–3.0)

## 2018-12-27 NOTE — Patient Instructions (Signed)
Continue coumadin 1 1/2 tablets daily except 1 tablet on Sundays and Wednesdays Recheck in 6 weeks

## 2019-01-14 DIAGNOSIS — R739 Hyperglycemia, unspecified: Secondary | ICD-10-CM | POA: Diagnosis not present

## 2019-01-14 DIAGNOSIS — E7800 Pure hypercholesterolemia, unspecified: Secondary | ICD-10-CM | POA: Diagnosis not present

## 2019-01-14 DIAGNOSIS — E782 Mixed hyperlipidemia: Secondary | ICD-10-CM | POA: Diagnosis not present

## 2019-01-14 DIAGNOSIS — E875 Hyperkalemia: Secondary | ICD-10-CM | POA: Diagnosis not present

## 2019-01-14 DIAGNOSIS — K210 Gastro-esophageal reflux disease with esophagitis: Secondary | ICD-10-CM | POA: Diagnosis not present

## 2019-01-17 DIAGNOSIS — Z6824 Body mass index (BMI) 24.0-24.9, adult: Secondary | ICD-10-CM | POA: Diagnosis not present

## 2019-01-17 DIAGNOSIS — I4891 Unspecified atrial fibrillation: Secondary | ICD-10-CM | POA: Diagnosis not present

## 2019-01-17 DIAGNOSIS — I10 Essential (primary) hypertension: Secondary | ICD-10-CM | POA: Diagnosis not present

## 2019-01-17 DIAGNOSIS — E782 Mixed hyperlipidemia: Secondary | ICD-10-CM | POA: Diagnosis not present

## 2019-01-17 DIAGNOSIS — G3184 Mild cognitive impairment, so stated: Secondary | ICD-10-CM | POA: Diagnosis not present

## 2019-01-17 DIAGNOSIS — N183 Chronic kidney disease, stage 3 (moderate): Secondary | ICD-10-CM | POA: Diagnosis not present

## 2019-01-24 ENCOUNTER — Telehealth: Payer: Self-pay | Admitting: Cardiology

## 2019-01-24 NOTE — Telephone Encounter (Signed)
Virtual Visit Pre-Appointment Phone Call  "(Name), I am calling you today to discuss your upcoming appointment. We are currently trying to limit exposure to the virus that causes COVID-19 by seeing patients at home rather than in the office."  1. "What is the BEST phone number to call the day of the visit?" - include this in appointment notes  2. Do you have or have access to (through a family member/friend) a smartphone with video capability that we can use for your visit?" a. If yes - list this number in appt notes as cell (if different from BEST phone #) and list the appointment type as a VIDEO visit in appointment notes b. If no - list the appointment type as a PHONE visit in appointment notes  3. Confirm consent - "In the setting of the current Covid19 crisis, you are scheduled for a (phone or video) visit with your provider on (date) at (time).  Just as we do with many in-office visits, in order for you to participate in this visit, we must obtain consent.  If you'd like, I can send this to your mychart (if signed up) or email for you to review.  Otherwise, I can obtain your verbal consent now.  All virtual visits are billed to your insurance company just like a normal visit would be.  By agreeing to a virtual visit, we'd like you to understand that the technology does not allow for your provider to perform an examination, and thus may limit your provider's ability to fully assess your condition. If your provider identifies any concerns that need to be evaluated in person, we will make arrangements to do so.  Finally, though the technology is pretty good, we cannot assure that it will always work on either your or our end, and in the setting of a video visit, we may have to convert it to a phone-only visit.  In either situation, we cannot ensure that we have a secure connection.  Are you willing to proceed?" STAFF: Did the patient verbally acknowledge consent to telehealth visit? Document  YES/NO here: yes  4. Advise patient to be prepared - "Two hours prior to your appointment, go ahead and check your blood pressure, pulse, oxygen saturation, and your weight (if you have the equipment to check those) and write them all down. When your visit starts, your provider will ask you for this information. If you have an Apple Watch or Kardia device, please plan to have heart rate information ready on the day of your appointment. Please have a pen and paper handy nearby the day of the visit as well."  5. Give patient instructions for MyChart download to smartphone OR [email or web address] as below if video visit (depending on what platform provider is using)  6. Inform patient they will receive a phone call 15 minutes prior to their appointment time (may be from unknown caller ID) so they should be prepared to answer    TELEPHONE CALL NOTE  Benjamin Mason has been deemed a candidate for a follow-up tele-health visit to limit community exposure during the Covid-19 pandemic. I spoke with the patient via phone to ensure availability of phone/video source, confirm preferred email & phone number, and discuss instructions and expectations.  I reminded Benjamin Mason to be prepared with any vital sign and/or heart rhythm information that could potentially be obtained via home monitoring, at the time of his visit. I reminded Benjamin Mason to expect a phone call prior to  his visit.  Benjamin Mason 01/24/2019 2:52 PM   INSTRUCTIONS FOR DOWNLOADING THE MYCHART APP TO SMARTPHONE  - The patient must first make sure to have activated MyChart and know their login information - If Apple, go to [employer] and type in MyChart in the search bar and download the app. If Android, ask patient to go to [employer] and type in [place] in the search bar and download the app. The app is free but as with any other app downloads, their phone may require them to verify saved payment information or  Apple/Android password.  - The patient will need to then log into the app with their MyChart username and password, and select [facility] as their healthcare provider to link the account. When it is time for your visit, go to the MyChart app, find appointments, and click Begin Video Visit. Be sure to Select Allow for your device to access the Microphone and Camera for your visit. You will then be connected, and your provider will be with you shortly.  **If they have any issues connecting, or need assistance please contact MyChart service desk [phone])**  **If using a computer, in order to ensure the best quality for their visit they will need to use either of the following Internet Browsers: [employer], or Google Chrome**  IF USING DOXIMITY or DOXY.ME - The patient will receive a link just prior to their visit by text.     FULL LENGTH CONSENT FOR TELE-HEALTH VISIT   I hereby voluntarily request, consent and authorize [facility] and its employed or contracted physicians, physician assistants, nurse practitioners or other licensed health care professionals (the Practitioner), to provide me with telemedicine health care services (the Services") as deemed necessary by the treating Practitioner. I acknowledge and consent to receive the Services by the Practitioner via telemedicine. I understand that the telemedicine visit will involve communicating with the Practitioner through live audiovisual communication technology and the disclosure of certain medical information by electronic transmission. I acknowledge that I have been given the opportunity to request an in-person assessment or other available alternative prior to the telemedicine visit and am voluntarily participating in the telemedicine visit.  I understand that I have the right to withhold or withdraw my consent to the use of telemedicine in the course of my care at any time, without affecting my right to future care  or treatment, and that the Practitioner or I may terminate the telemedicine visit at any time. I understand that I have the right to inspect all information obtained and/or recorded in the course of the telemedicine visit and may receive copies of available information for a reasonable fee.  I understand that some of the potential risks of receiving the Services via telemedicine include:   Delay or interruption in medical evaluation due to technological equipment failure or disruption;  Information transmitted may not be sufficient (e.g. poor resolution of images) to allow for appropriate medical decision making by the Practitioner; and/or   In rare instances, security protocols could fail, causing a breach of personal health information.  Furthermore, I acknowledge that it is my responsibility to provide information about my medical history, conditions and care that is complete and accurate to the best of my ability. I acknowledge that Practitioner's advice, recommendations, and/or decision may be based on factors not within their control, such as incomplete or inaccurate data provided by me or distortions of diagnostic images or specimens that may result from electronic transmissions. I  understand that the practice of medicine is not an exact science and that Practitioner makes no warranties or guarantees regarding treatment outcomes. I acknowledge that I will receive a copy of this consent concurrently upon execution via email to the email address I last provided but may also request a printed copy by calling the office of [facility].    I understand that my insurance will be billed for this visit.   I have read or had this consent read to me.  I understand the contents of this consent, which adequately explains the benefits and risks of the Services being provided via telemedicine.   I have been provided ample opportunity to ask questions regarding this consent and the Services and have had  my questions answered to my satisfaction.  I give my informed consent for the services to be provided through the use of telemedicine in my medical care  By participating in this telemedicine visit I agree to the above.

## 2019-01-29 NOTE — Progress Notes (Signed)
Virtual Visit via Telephone Note   This visit type was conducted due to national recommendations for restrictions regarding the COVID-19 Pandemic (e.g. social distancing) in an effort to limit this patient's exposure and mitigate transmission in our community.  Due to his co-morbid illnesses, this patient is at least at moderate risk for complications without adequate follow up.  This format is felt to be most appropriate for this patient at this time.  The patient did not have access to video technology/had technical difficulties with video requiring transitioning to audio format only (telephone).  All issues noted in this document were discussed and addressed.  No physical exam could be performed with this format.  Please refer to the patient's chart for his  consent to telehealth for [facility].   Date:  01/30/2019   ID:  Benjamin Mason, DOB 1931-08-03, MRN [id number]  Patient Location: Home Provider Location: Office  PCP:  Manon Hilding, MD  Cardiologist:  Rozann Lesches, MD Electrophysiologist:  None   Evaluation Performed:  Follow-Up Visit  Chief Complaint:   Cardiac follow-up  History of Present Illness:    Benjamin Mason is an 85 y.o. male last seen in December 2019.  He did not have video access today and we spoke by phone.  He tells me that he has been doing well overall, no chest pain with exertion, no palpitations, lightheadedness or syncope.  He remains on Coumadin with follow-up in anticoagulation clinic.  Recent INR was 2.3.  We did talk today about switching to Eliquis, he states that he had been thinking about this and now that he has better prescription coverage would like to make a change.  I am requesting his most recent lab work from [facility], based on his last creatinine we will likely use Eliquis at 2.5 mg twice daily.  He would also need to stop aspirin.  I reviewed his remaining medications which are outlined below.  He reports no other changes.   The patient does not have symptoms concerning for COVID-19 infection (fever, chills, cough, or new shortness of breath).    Past Medical History:  Diagnosis Date  . Anxiety   . Aortic regurgitation    Mild to moderate  . CKD (chronic kidney disease) stage 3, GFR 30-59 ml/min (HCC)   . Coronary atherosclerosis of native coronary artery    BMS to RCA and LAD 1995  . Essential hypertension   . IBS (irritable bowel syndrome)   . Lactose intolerance   . Mitral regurgitation    Moderate  . Mixed hyperlipidemia   . Permanent atrial fibrillation    Past Surgical History:  Procedure Laterality Date  . BALLOON DILATION  08/03/2012   Procedure: BALLOON DILATION;  Surgeon: Rogene Houston, MD;  [facility];  Service: Endoscopy;  Laterality: N/A;  . COLONOSCOPY WITH ESOPHAGOGASTRODUODENOSCOPY (EGD)  08/03/2012   Procedure: COLONOSCOPY WITH ESOPHAGOGASTRODUODENOSCOPY (EGD);  Surgeon: Rogene Houston, MD;  [facility];  Service: Endoscopy;  Laterality: N/A;  215  . LEFT HEART CATH AND CORONARY ANGIOGRAPHY N/A 11/18/2016   Procedure: Left Heart Cath and Coronary Angiography;  Surgeon: Peter M Martinique, MD;  Location: [place] CV LAB;  Service: Cardiovascular;  Laterality: N/A;  . LEFT INGUINAL HERNIA    . MALONEY DILATION  08/03/2012   Procedure: MALONEY DILATION;  Surgeon: Rogene Houston, MD;  [facility];  Service: Endoscopy;  Laterality: N/A;  . PENILE PROSTHESIS IMPLANT    . SAVORY  DILATION  08/03/2012   Procedure: SAVORY DILATION;  Surgeon: Rogene Houston, MD;  [facility];  Service: Endoscopy;  Laterality: N/A;     Current Meds  Medication Sig  . acetaminophen (TYLENOL ARTHRITIS PAIN) 650 MG CR tablet Take 1,300 mg by mouth 2 (two) times daily.   Marland Kitchen amLODipine (NORVASC) 2.5 MG tablet TAKE ONE TABLET BY MOUTH DAILY  . Apoaequorin (PREVAGEN) 10 MG CAPS Take by mouth daily.  Marland Kitchen aspirin EC 81 MG tablet Take 81 mg by mouth daily.  Marland Kitchen atenolol  (TENORMIN) 50 MG tablet Take 1 tablet (50 mg total) by mouth daily.  . cholecalciferol (VITAMIN D3) 25 MCG (1000 UT) tablet Take 1,000 Units by mouth daily.  . finasteride (PROSCAR) 5 MG tablet Take 5 mg by mouth daily.    . Lactase 9000 units CHEW Chew 1 tablet by mouth See admin instructions. Pt takes only as needed when eating any dairy products  . MELATONIN PO Take 3 mg by mouth at bedtime.   . Multiple Vitamin (MULTIVITAMIN) tablet Take 1 tablet by mouth daily.  Marland Kitchen NITROSTAT 0.4 MG SL tablet PLACE ONE TABLET UNDER THE TONGUE EVERY 5 MINUTES AS NEEDED FOR CHEST PAIN  . pantoprazole (PROTONIX) 40 MG tablet Take 1 tablet by mouth daily.  . Probiotic Product ([place]) Take by mouth. This is Ultraflora IB - He takes PO 4 times weekly.  . simvastatin (ZOCOR) 20 MG tablet Take 10 mg by mouth at bedtime.  . Tamsulosin HCl (FLOMAX) 0.4 MG CAPS Take 0.4 mg by mouth daily.    . Testosterone Cypionate 200 MG/ML KIT Inject 100-150 mg into the muscle every 14 (fourteen) days. For IM use only.   Pt uses 0.5 to 0.7 mL  . warfarin (COUMADIN) 3 MG tablet TAKE AS DIRECTED BY COUMADIN CLINIC  . [DISCONTINUED] simvastatin (ZOCOR) 10 MG tablet Take 10 mg by mouth every evening.      Allergies:   Patient has no known allergies.   Social History   Tobacco Use  . Smoking status: Former Smoker    Packs/day: 2.00    Years: 25.00    Pack years: 50.00    Types: Cigarettes    Quit date: 08/15/1962    Years since quitting: 56.4  . Smokeless tobacco: Never Used  Substance Use Topics  . Alcohol use: No    Alcohol/week: 0.0 standard drinks  . Drug use: No     Family Hx: The patient's family history includes Heart attack in his father.  ROS:   Please see the history of present illness. All other systems reviewed and are negative.   Prior CV studies:   The following studies were reviewed today:  Echocardiogram 01/11/2017: Study Conclusions  - Left ventricle: The cavity size was  normal. Wall thickness was normal. Systolic function was normal. The estimated ejection fraction was in the range of 50% to 55%. Wall motion was normal; there were no regional wall motion abnormalities. The study was not technically sufficient to allow evaluation of LV diastolic dysfunction due to atrial fibrillation. - Aortic valve: Trileaflet; mildly thickened, mildly calcified leaflets. There was no stenosis. There was moderate regurgitation. - Mitral valve: Mildly thickened leaflets . There were two regurgitant jets (one mild to moderate in severity, the other mild), leading to overall moderate regurgitation. - Left atrium: The atrium was moderately dilated. - Right atrium: The atrium was moderately dilated. - Tricuspid valve: Mildly thickened leaflets. There was moderate regurgitation. - Pulmonic valve:  There was mild regurgitation. - Pulmonary arteries: Inadequate tricuspid regurgitant jet to accurately assess pulmonary pressures.  Cardiac catheterization 11/18/2016:  Ost RCA to Mid RCA lesion, 25 %stenosed.  Dist RCA lesion, 45 %stenosed.  Ost LAD to Prox LAD lesion, 0 %stenosed.  Prox LAD to Mid LAD lesion, 35 %stenosed.  LV end diastolic pressure is normal.  Mid LAD to Dist LAD lesion, 35 %stenosed.  1. Nonobstructive CAD 2. Normal LVEDP  Labs/Other Tests and Data Reviewed:    EKG:  An ECG dated 02/08/2018 was personally reviewed today and demonstrated:  Rate controlled atrial fibrillation.  Recent Labs:  11/18/2016: BUN 39; Creatinine, Ser 1.92; Hemoglobin 13.9; Platelets 154; Potassium 4.8; Sodium 138  Wt Readings from Last 3 Encounters:  01/30/19 167 lb (75.8 kg)  08/02/18 187 lb (84.8 kg)  02/08/18 181 lb (82.1 kg)     Objective:    Vital Signs:  BP 128/73   Pulse 71   Ht 6' (1.829 m)   Wt 167 lb (75.8 kg)   BMI 22.65 kg/m    Patient spoke in full sentences, not short of breath. No audible wheezing or coughing. Speech  pattern normal.  ASSESSMENT & PLAN:    1.  Permanent atrial fibrillation.  He is doing well with good heart rate control on atenolol.  He has been on Coumadin long-term but would like to switch to Eliquis, states that he has better insurance coverage at this time.  We will get his most recent lab work from [facility].  Based on his last creatinine I suspect that we will need to use Eliquis 2.5 mg twice daily.  He will need to stop aspirin as well.  Further recommendations to follow.  2.  Nonobstructive CAD by cardiac catheterization in 2018.  He reports no active angina symptoms and remains on statin therapy.  3.  Mixed hyperlipidemia, on Zocor.  Requesting most recent lab work from [facility].  COVID-19 Education: The signs and symptoms of COVID-19 were discussed with the patient and how to seek care for testing (follow up with PCP or arrange E-visit).  The importance of social distancing was discussed today.  Time:   Today, I have spent 9 minutes with the patient with telehealth technology discussing the above problems.     Medication Adjustments/Labs and Tests Ordered: Current medicines are reviewed at length with the patient today.  Concerns regarding medicines are outlined above.   Tests Ordered: No orders of the defined types were placed in this encounter.   Medication Changes: No orders of the defined types were placed in this encounter.   Follow Up:  In Person 6 months in the [place] office.  Signed, Rozann Lesches, MD  01/30/2019 8:45 AM    [place]

## 2019-01-30 ENCOUNTER — Ambulatory Visit: Payer: Self-pay | Admitting: *Deleted

## 2019-01-30 ENCOUNTER — Telehealth: Payer: Self-pay | Admitting: *Deleted

## 2019-01-30 ENCOUNTER — Telehealth (INDEPENDENT_AMBULATORY_CARE_PROVIDER_SITE_OTHER): Payer: Medicare Other | Admitting: Cardiology

## 2019-01-30 ENCOUNTER — Encounter: Payer: Self-pay | Admitting: Cardiology

## 2019-01-30 ENCOUNTER — Encounter: Payer: Self-pay | Admitting: *Deleted

## 2019-01-30 VITALS — BP 128/73 | HR 71 | Ht 72.0 in | Wt 167.0 lb

## 2019-01-30 DIAGNOSIS — N183 Chronic kidney disease, stage 3 unspecified: Secondary | ICD-10-CM

## 2019-01-30 DIAGNOSIS — I25119 Atherosclerotic heart disease of native coronary artery with unspecified angina pectoris: Secondary | ICD-10-CM

## 2019-01-30 DIAGNOSIS — I4821 Permanent atrial fibrillation: Secondary | ICD-10-CM | POA: Diagnosis not present

## 2019-01-30 DIAGNOSIS — Z7189 Other specified counseling: Secondary | ICD-10-CM | POA: Diagnosis not present

## 2019-01-30 DIAGNOSIS — Z79899 Other long term (current) drug therapy: Secondary | ICD-10-CM

## 2019-01-30 MED ORDER — APIXABAN 2.5 MG PO TABS: 2.5000 mg | ORAL_TABLET | Freq: Two times a day (BID) | ORAL | 6 refills | Status: DC

## 2019-01-30 NOTE — Telephone Encounter (Signed)
Patient informed and verbalized understanding of plan. Lab orders mailed to home address per patient request so that lab work can be done at [facility] office.

## 2019-01-30 NOTE — Telephone Encounter (Signed)
-----   Message from Satira Sark, MD sent at 01/30/2019 11:56 AM EDT ----- Results reviewed.  Creatinine is 1.87.  Please see my note from today.  Would plan to stop Coumadin and aspirin.  Will initiate Eliquis 2.5 mg twice daily.  He will need CBC and BMET in 4 months.

## 2019-01-30 NOTE — Patient Instructions (Addendum)
Medication Instructions:   Your physician recommends that you continue on your current medications as directed. Please refer to the Current Medication list given to you today.  We will contact you about starting eliquis after your lab work is reviewed Labwork:  NONE  Testing/Procedures:  NONE  Follow-Up:  Your physician recommends that you schedule a follow-up appointment in: 6 months. You will receive a reminder letter in the mail in about months 4 reminding you to call and schedule your appointment. If you don't receive this letter, please contact our office.  Any Other Special Instructions Will Be Listed Below (If Applicable).  If you need a refill on your cardiac medications before your next appointment, please call your pharmacy.

## 2019-01-30 NOTE — Telephone Encounter (Signed)
Noted.  Pt removed from Coumadin Clinic call list.

## 2019-02-15 DIAGNOSIS — I2510 Atherosclerotic heart disease of native coronary artery without angina pectoris: Secondary | ICD-10-CM | POA: Diagnosis not present

## 2019-02-15 DIAGNOSIS — Z6824 Body mass index (BMI) 24.0-24.9, adult: Secondary | ICD-10-CM | POA: Diagnosis not present

## 2019-02-15 DIAGNOSIS — M1712 Unilateral primary osteoarthritis, left knee: Secondary | ICD-10-CM | POA: Diagnosis not present

## 2019-02-15 DIAGNOSIS — I10 Essential (primary) hypertension: Secondary | ICD-10-CM | POA: Diagnosis not present

## 2019-03-15 DIAGNOSIS — E782 Mixed hyperlipidemia: Secondary | ICD-10-CM | POA: Diagnosis not present

## 2019-03-15 DIAGNOSIS — I10 Essential (primary) hypertension: Secondary | ICD-10-CM | POA: Diagnosis not present

## 2019-04-15 DIAGNOSIS — I10 Essential (primary) hypertension: Secondary | ICD-10-CM | POA: Diagnosis not present

## 2019-04-15 DIAGNOSIS — E782 Mixed hyperlipidemia: Secondary | ICD-10-CM | POA: Diagnosis not present

## 2019-04-24 DIAGNOSIS — L570 Actinic keratosis: Secondary | ICD-10-CM | POA: Diagnosis not present

## 2019-05-21 DIAGNOSIS — Z79899 Other long term (current) drug therapy: Secondary | ICD-10-CM | POA: Diagnosis not present

## 2019-05-21 DIAGNOSIS — I4821 Permanent atrial fibrillation: Secondary | ICD-10-CM | POA: Diagnosis not present

## 2019-05-22 ENCOUNTER — Telehealth: Payer: Self-pay | Admitting: *Deleted

## 2019-05-22 NOTE — Telephone Encounter (Signed)
Patient informed. Copy sent to PCP °

## 2019-05-22 NOTE — Telephone Encounter (Signed)
-----   Message from Satira Sark, MD sent at 05/21/2019  2:15 PM EDT ----- Results reviewed.  Follow-up lab work on low-dose Eliquis.  Creatinine is relatively stable at 1.78, he is very mildly anemic with hemoglobin 12.2.  Would continue with same plan.

## 2019-06-07 ENCOUNTER — Other Ambulatory Visit: Payer: Self-pay | Admitting: Cardiology

## 2019-06-09 DIAGNOSIS — R42 Dizziness and giddiness: Secondary | ICD-10-CM | POA: Diagnosis not present

## 2019-06-09 DIAGNOSIS — N189 Chronic kidney disease, unspecified: Secondary | ICD-10-CM | POA: Diagnosis not present

## 2019-06-09 DIAGNOSIS — R0902 Hypoxemia: Secondary | ICD-10-CM | POA: Diagnosis not present

## 2019-06-09 DIAGNOSIS — I2510 Atherosclerotic heart disease of native coronary artery without angina pectoris: Secondary | ICD-10-CM | POA: Diagnosis not present

## 2019-06-09 DIAGNOSIS — Z955 Presence of coronary angioplasty implant and graft: Secondary | ICD-10-CM | POA: Diagnosis not present

## 2019-06-09 DIAGNOSIS — Z7901 Long term (current) use of anticoagulants: Secondary | ICD-10-CM | POA: Diagnosis not present

## 2019-06-09 DIAGNOSIS — J189 Pneumonia, unspecified organism: Secondary | ICD-10-CM | POA: Diagnosis not present

## 2019-06-09 DIAGNOSIS — I129 Hypertensive chronic kidney disease with stage 1 through stage 4 chronic kidney disease, or unspecified chronic kidney disease: Secondary | ICD-10-CM | POA: Diagnosis not present

## 2019-06-09 DIAGNOSIS — I482 Chronic atrial fibrillation, unspecified: Secondary | ICD-10-CM | POA: Diagnosis not present

## 2019-06-09 DIAGNOSIS — Z7982 Long term (current) use of aspirin: Secondary | ICD-10-CM | POA: Diagnosis not present

## 2019-06-09 DIAGNOSIS — Z87891 Personal history of nicotine dependence: Secondary | ICD-10-CM | POA: Diagnosis not present

## 2019-06-09 DIAGNOSIS — I517 Cardiomegaly: Secondary | ICD-10-CM | POA: Diagnosis not present

## 2019-06-09 DIAGNOSIS — I4821 Permanent atrial fibrillation: Secondary | ICD-10-CM | POA: Diagnosis not present

## 2019-06-09 DIAGNOSIS — N183 Chronic kidney disease, stage 3 unspecified: Secondary | ICD-10-CM | POA: Diagnosis not present

## 2019-06-09 DIAGNOSIS — R918 Other nonspecific abnormal finding of lung field: Secondary | ICD-10-CM | POA: Diagnosis not present

## 2019-06-09 DIAGNOSIS — R531 Weakness: Secondary | ICD-10-CM | POA: Diagnosis not present

## 2019-06-09 DIAGNOSIS — R008 Other abnormalities of heart beat: Secondary | ICD-10-CM | POA: Diagnosis not present

## 2019-06-12 ENCOUNTER — Encounter: Payer: Self-pay | Admitting: *Deleted

## 2019-06-12 ENCOUNTER — Encounter: Payer: Self-pay | Admitting: Cardiology

## 2019-06-12 DIAGNOSIS — Z23 Encounter for immunization: Secondary | ICD-10-CM | POA: Diagnosis not present

## 2019-06-17 ENCOUNTER — Ambulatory Visit (INDEPENDENT_AMBULATORY_CARE_PROVIDER_SITE_OTHER): Payer: Medicare Other | Admitting: Cardiology

## 2019-06-17 ENCOUNTER — Other Ambulatory Visit: Payer: Self-pay

## 2019-06-17 ENCOUNTER — Telehealth: Payer: Self-pay | Admitting: Cardiology

## 2019-06-17 ENCOUNTER — Encounter: Payer: Self-pay | Admitting: Cardiology

## 2019-06-17 ENCOUNTER — Ambulatory Visit (INDEPENDENT_AMBULATORY_CARE_PROVIDER_SITE_OTHER): Payer: Medicare Other

## 2019-06-17 VITALS — BP 136/77 | HR 73 | Ht 72.0 in | Wt 179.2 lb

## 2019-06-17 DIAGNOSIS — I493 Ventricular premature depolarization: Secondary | ICD-10-CM

## 2019-06-17 DIAGNOSIS — I25119 Atherosclerotic heart disease of native coronary artery with unspecified angina pectoris: Secondary | ICD-10-CM | POA: Diagnosis not present

## 2019-06-17 DIAGNOSIS — I4821 Permanent atrial fibrillation: Secondary | ICD-10-CM | POA: Diagnosis not present

## 2019-06-17 DIAGNOSIS — N1832 Chronic kidney disease, stage 3b: Secondary | ICD-10-CM | POA: Diagnosis not present

## 2019-06-17 DIAGNOSIS — I10 Essential (primary) hypertension: Secondary | ICD-10-CM | POA: Diagnosis not present

## 2019-06-17 NOTE — Addendum Note (Signed)
Addended by: Merlene Laughter on: 06/17/2019 12:07 PM   Modules accepted: Orders

## 2019-06-17 NOTE — Patient Instructions (Addendum)
Medication Instructions:    Your physician recommends that you continue on your current medications as directed. Please refer to the Current Medication list given to you today.  Labwork:  NONE  Testing/Procedures: Your physician has recommended that you wear a holter monitor for 72 hours. Holter monitors are medical devices that record the heart's electrical activity. Doctors most often use these monitors to diagnose arrhythmias. Arrhythmias are problems with the speed or rhythm of the heartbeat. The monitor is a small, portable device. You can wear one while you do your normal daily activities. This is usually used to diagnose what is causing palpitations/syncope (passing out).  Follow-Up:  Your physician recommends that you schedule a follow-up appointment in: pending test result.  Any Other Special Instructions Will Be Listed Below (If Applicable).  If you need a refill on your cardiac medications before your next appointment, please call your pharmacy.

## 2019-06-17 NOTE — Progress Notes (Signed)
Cardiology Office Note  Date: 06/17/2019   ID: Benjamin Mason, DOB 01/12/1931, MRN [id number]  PCP:  Manon Hilding, MD  Cardiologist:  Rozann Lesches, MD Electrophysiologist:  None   Chief Complaint  Patient presents with  . Hospitalization Follow-up    History of Present Illness: Benjamin Mason is an 85 y.o. male last assessed via telehealth encounter in June. I reviewed recent records, he was just discharged from [facility] after treatment for pneumonia, SARS coronavirus 2 negative.  Chest x-ray demonstrated developing left lung base infiltrate.  He was reported to have intermittent sinus tachycardia and also some ventricular bigeminy by monitoring in the setting of his illness.  I personally reviewed his ECG from October 25 which shows probable atrial fibrillation with intermittent PVCs and nonspecific ST changes.  He is here today with his daughter.  She tells me that they originally took him in for evaluation due to confusion, he says that he had a "funny feeling" in his head.  No syncope or chest pain, no palpitations.  He feels better, completed antibiotics this morning.  No obvious fevers or chills.  No cough.    I personally reviewed his ECG today which shows atrial fibrillation with pattern of ventricular bigeminy, heart rate in the 60s.  Otherwise nonspecific ST changes and low voltage.  He was asymptomatic at the time of the tracing.  Cardiac work-up from 2018 included a reassuring cardiac catheterization with mild to moderate nonobstructive atherosclerosis and LVEF of 50 to 55%.  We transitioned him from Coumadin to Eliquis since our last encounter.  He has tolerated this well.  Also continues on atenolol.  Past Medical History:  Diagnosis Date  . Anxiety   . Aortic regurgitation    Mild to moderate  . CKD (chronic kidney disease) stage 3, GFR 30-59 ml/min   . Coronary atherosclerosis of native coronary artery    BMS to RCA and LAD 1995  .  Essential hypertension   . IBS (irritable bowel syndrome)   . Lactose intolerance   . Mitral regurgitation    Moderate  . Mixed hyperlipidemia   . Permanent atrial fibrillation [facility])     Past Surgical History:  Procedure Laterality Date  . BALLOON DILATION  08/03/2012   Procedure: BALLOON DILATION;  Surgeon: Rogene Houston, MD;  [facility];  Service: Endoscopy;  Laterality: N/A;  . COLONOSCOPY WITH ESOPHAGOGASTRODUODENOSCOPY (EGD)  08/03/2012   Procedure: COLONOSCOPY WITH ESOPHAGOGASTRODUODENOSCOPY (EGD);  Surgeon: Rogene Houston, MD;  [facility];  Service: Endoscopy;  Laterality: N/A;  215  . LEFT HEART CATH AND CORONARY ANGIOGRAPHY N/A 11/18/2016   Procedure: Left Heart Cath and Coronary Angiography;  Surgeon: Peter M Martinique, MD;  Location: [place] CV LAB;  Service: Cardiovascular;  Laterality: N/A;  . LEFT INGUINAL HERNIA    . MALONEY DILATION  08/03/2012   Procedure: MALONEY DILATION;  Surgeon: Rogene Houston, MD;  [facility];  Service: Endoscopy;  Laterality: N/A;  . PENILE PROSTHESIS IMPLANT    . SAVORY DILATION  08/03/2012   Procedure: SAVORY DILATION;  Surgeon: Rogene Houston, MD;  [facility];  Service: Endoscopy;  Laterality: N/A;    Current Outpatient Medications  Medication Sig Dispense Refill  . acetaminophen (TYLENOL ARTHRITIS PAIN) 650 MG CR tablet Take 1,300 mg by mouth 2 (two) times daily.     Marland Kitchen amLODipine (NORVASC) 2.5 MG tablet TAKE ONE TABLET BY MOUTH DAILY 90  tablet 3  . apixaban (ELIQUIS) 2.5 MG TABS tablet Take 1 tablet (2.5 mg total) by mouth 2 (two) times daily. 60 tablet 6  . Apoaequorin (PREVAGEN) 10 MG CAPS Take by mouth daily.    Marland Kitchen atenolol (TENORMIN) 50 MG tablet TAKE ONE TABLET BY MOUTH DAILY (MORNING) 90 tablet 1  . cholecalciferol (VITAMIN D3) 25 MCG (1000 UT) tablet Take 1,000 Units by mouth daily.    . finasteride (PROSCAR) 5 MG tablet Take 5 mg by mouth daily.      . Lactase 9000 units CHEW  Chew 1 tablet by mouth See admin instructions. Pt takes only as needed when eating any dairy products    . MELATONIN PO Take 6 mg by mouth at bedtime.     . Multiple Vitamin (MULTIVITAMIN) tablet Take 1 tablet by mouth daily.    Marland Kitchen NITROSTAT 0.4 MG SL tablet PLACE ONE TABLET UNDER THE TONGUE EVERY 5 MINUTES AS NEEDED FOR CHEST PAIN 25 tablet 3  . pantoprazole (PROTONIX) 40 MG tablet Take 1 tablet by mouth daily.    . Probiotic Product ([place]) Take by mouth. This is Ultraflora IB - He takes PO 4 times weekly.    . simvastatin (ZOCOR) 20 MG tablet Take 10 mg by mouth at bedtime.    . Tamsulosin HCl (FLOMAX) 0.4 MG CAPS Take 0.4 mg by mouth daily.      . Testosterone Cypionate 200 MG/ML KIT Inject 100-150 mg into the muscle every 14 (fourteen) days. For IM use only.   Pt uses 0.5 to 0.7 mL     No current facility-administered medications for this visit.    Allergies:  Patient has no known allergies.   Social History: The patient  reports that he quit smoking about 56 years ago. His smoking use included cigarettes. He has a 50.00 pack-year smoking history. He has never used smokeless tobacco. He reports that he does not drink alcohol or use drugs.   ROS:  Please see the history of present illness. Otherwise, complete review of systems is positive for intermittent confusion - per daughter.  All other systems are reviewed and negative.   Physical Exam: VS:  BP 136/77   Pulse 73   Ht 6' (1.829 m)   Wt 179 lb 3.2 oz (81.3 kg)   SpO2 97%   BMI 24.30 kg/m , BMI Body mass index is 24.3 kg/m.  Wt Readings from Last 3 Encounters:  06/17/19 179 lb 3.2 oz (81.3 kg)  01/30/19 167 lb (75.8 kg)  08/02/18 187 lb (84.8 kg)    General: Elderly male, appears comfortable at rest. HEENT: Conjunctiva and lids normal, wearing a mask. Neck: Supple, no elevated JVP or carotid bruits, no thyromegaly. Lungs: Clear to auscultation, nonlabored breathing at rest. Cardiac: Irregular, no S3,  soft systolic murmur. Abdomen: Soft, nontender, bowel sounds present. Extremities: No pitting edema, distal pulses 2+. Skin: Warm and dry. Musculoskeletal: No kyphosis. Neuropsychiatric: Alert and oriented x3, affect grossly appropriate.  ECG:  An ECG dated 02/08/2018 was personally reviewed today and demonstrated:  Rate controlled atrial fibrillation.  Recent Labwork:  12/03/2018: BUN 34, creatinine 1.78, potassium 4.9, hemoglobin 12.2, platelets 146 October 2020: AST 20, ALT 13, BUN 29, creatinine 1.76, hemoglobin 11.8, magnesium 2.0, potassium 4.7, platelets 137, troponin T negative  Other Studies Reviewed Today:  Echocardiogram 01/11/2017: Study Conclusions  - Left ventricle: The cavity size was normal. Wall thickness was   normal. Systolic function was normal. The estimated ejection  fraction was in the range of 50% to 55%. Wall motion was normal;   there were no regional wall motion abnormalities. The study was   not technically sufficient to allow evaluation of LV diastolic   dysfunction due to atrial fibrillation. - Aortic valve: Trileaflet; mildly thickened, mildly calcified   leaflets. There was no stenosis. There was moderate   regurgitation. - Mitral valve: Mildly thickened leaflets . There were two   regurgitant jets (one mild to moderate in severity, the other   mild), leading to overall moderate regurgitation. - Left atrium: The atrium was moderately dilated. - Right atrium: The atrium was moderately dilated. - Tricuspid valve: Mildly thickened leaflets. There was moderate   regurgitation. - Pulmonic valve: There was mild regurgitation. - Pulmonary arteries: Inadequate tricuspid regurgitant jet to   accurately assess pulmonary pressures.  Cardiac catheterization 11/18/2016:  Ost RCA to Mid RCA lesion, 25 %stenosed.  Dist RCA lesion, 45 %stenosed.  Ost LAD to Prox LAD lesion, 0 %stenosed.  Prox LAD to Mid LAD lesion, 35 %stenosed.  LV end diastolic pressure  is normal.  Mid LAD to Dist LAD lesion, 35 %stenosed.   1. Nonobstructive CAD 2. Normal LVEDP  Plan: continue medical therapy.  Assessment and Plan:  1.  Recent hospital stay with suspected pneumonia, presented with confusion.  He states that he feels better, no fevers, completed antibiotics today.  Follow-up scheduled with PCP.  2.  Atrial fibrillation with PVCs in pattern of bigeminy by ECG today, also reportedly during hospital stay.  Based on record review I presumed that most of this was based on physiologic stress, although with continued ectopy at this time, we will obtain a 48-hour Zio patch to further quantify PVCs and also exclude NSVT.  Troponin T level was negative during hospital stay.  As noted above, cardiac structural and ischemic evaluation within the last 2 years was reassuring.  Continue beta-blocker and Xarelto.  3.  Mixed hyperlipidemia, he continues on Zocor.  4.  CKD stage III, recent creatinine 1.76.  Medication Adjustments/Labs and Tests Ordered: Current medicines are reviewed at length with the patient today.  Concerns regarding medicines are outlined above.   Tests Ordered: Orders Placed This Encounter  Procedures  . EKG 12-Lead    Medication Changes: No orders of the defined types were placed in this encounter.   Disposition:  Follow up monitor results and determine next step.  Signed, Satira Sark, MD, [facility] 06/17/2019 11:59 AM    [facility] [place], [place], [place] [address] Phone: [phone]; Fax: [phone]

## 2019-06-17 NOTE — Telephone Encounter (Signed)
Pre-cert Verification for the following procedure    48 hour ZIO dx: PVC's

## 2019-06-19 DIAGNOSIS — I4821 Permanent atrial fibrillation: Secondary | ICD-10-CM | POA: Diagnosis not present

## 2019-06-19 DIAGNOSIS — I493 Ventricular premature depolarization: Secondary | ICD-10-CM

## 2019-07-02 DIAGNOSIS — I498 Other specified cardiac arrhythmias: Secondary | ICD-10-CM | POA: Diagnosis not present

## 2019-07-02 DIAGNOSIS — I482 Chronic atrial fibrillation, unspecified: Secondary | ICD-10-CM | POA: Diagnosis not present

## 2019-07-03 ENCOUNTER — Telehealth: Payer: Self-pay | Admitting: Cardiology

## 2019-07-03 ENCOUNTER — Telehealth: Payer: Self-pay | Admitting: *Deleted

## 2019-07-03 DIAGNOSIS — I4821 Permanent atrial fibrillation: Secondary | ICD-10-CM

## 2019-07-03 DIAGNOSIS — I25119 Atherosclerotic heart disease of native coronary artery with unspecified angina pectoris: Secondary | ICD-10-CM

## 2019-07-03 DIAGNOSIS — I493 Ventricular premature depolarization: Secondary | ICD-10-CM

## 2019-07-03 NOTE — Telephone Encounter (Signed)
Patient informed and verbalized understanding of plan. Copy sent to PCP 

## 2019-07-03 NOTE — Telephone Encounter (Signed)
Pre-cert Verification for the following procedure    Echo scheduled for 07-09-2019 at [facility].

## 2019-07-03 NOTE — Telephone Encounter (Signed)
-----   Message from Satira Sark, MD sent at 07/03/2019  8:18 AM EST ----- Results reviewed.  Atrial fibrillation noted which is consistent with his history, but he does have frequent PVCs, only a brief 3 beat burst of NSVT.  He is already anticoagulated and on a beta-blocker.  We should start by getting a follow-up echocardiogram to ensure no change in LVEF, and can then decide about possibility of follow-up ischemic testing in light of the frequent PVCs.

## 2019-07-09 ENCOUNTER — Telehealth: Payer: Self-pay | Admitting: *Deleted

## 2019-07-09 ENCOUNTER — Other Ambulatory Visit: Payer: Self-pay | Admitting: *Deleted

## 2019-07-09 ENCOUNTER — Other Ambulatory Visit: Payer: Self-pay

## 2019-07-09 ENCOUNTER — Ambulatory Visit (HOSPITAL_COMMUNITY)
Admission: RE | Admit: 2019-07-09 | Discharge: 2019-07-09 | Disposition: A | Discharge status: Home / Self Care | Payer: Medicare Other | Source: Ambulatory Visit | Attending: Cardiology | Admitting: Cardiology

## 2019-07-09 ENCOUNTER — Encounter: Payer: Self-pay | Admitting: *Deleted

## 2019-07-09 DIAGNOSIS — J189 Pneumonia, unspecified organism: Secondary | ICD-10-CM | POA: Diagnosis not present

## 2019-07-09 DIAGNOSIS — N183 Chronic kidney disease, stage 3 unspecified: Secondary | ICD-10-CM | POA: Diagnosis not present

## 2019-07-09 DIAGNOSIS — I25119 Atherosclerotic heart disease of native coronary artery with unspecified angina pectoris: Secondary | ICD-10-CM

## 2019-07-09 DIAGNOSIS — I493 Ventricular premature depolarization: Secondary | ICD-10-CM

## 2019-07-09 DIAGNOSIS — G3184 Mild cognitive impairment, so stated: Secondary | ICD-10-CM | POA: Diagnosis not present

## 2019-07-09 DIAGNOSIS — I4821 Permanent atrial fibrillation: Secondary | ICD-10-CM

## 2019-07-09 DIAGNOSIS — R0902 Hypoxemia: Secondary | ICD-10-CM | POA: Diagnosis not present

## 2019-07-09 DIAGNOSIS — R410 Disorientation, unspecified: Secondary | ICD-10-CM | POA: Diagnosis not present

## 2019-07-09 NOTE — Progress Notes (Signed)
*  PRELIMINARY RESULTS* Echocardiogram 2D Echocardiogram has been performed.  Benjamin Mason 07/09/2019, 2:01 PM

## 2019-07-09 NOTE — Telephone Encounter (Signed)
Daughter Maudie Mercury informed and verbalized understanding of plan. Instructions for lexiscan read to daughter and she will set up mychart so that letter can be sent. Lab order faxed to [facility]. Copy sent to PCP

## 2019-07-09 NOTE — Telephone Encounter (Signed)
-----   Message from Satira Sark, MD sent at 07/09/2019  2:59 PM EST ----- Results reviewed.  Echocardiogram shows normal LVEF in the range of 55 to 60%.  Mild mitral and aortic regurgitation noted, would not be expected to cause symptoms or be associated with his frequent PVCs.  I would suggest that we get a Lexiscan Myoview on medical therapy to exclude any evidence of progressive ischemic heart disease, relatively reassuring by cardiac catheterization in 2018 however.  Also check TSH.  His PVCs are possibly from the outflow tract based on review of ECGs, a more benign process, but with increased frequency I may ultimately refer him for EP consultation.

## 2019-07-10 ENCOUNTER — Telehealth: Payer: Self-pay | Admitting: Cardiology

## 2019-07-10 NOTE — Telephone Encounter (Signed)
Pre-cert Verification for the following procedure    Lexiscan scheduled 07-18-2019 at [facility].

## 2019-07-10 NOTE — Telephone Encounter (Signed)
Pt says he has trouble swallowing while at dinner with his family (this is not a frequent problem but happens time to time) and says he was hitting his chest where the patch was and wanted to make sure this wouldn't cause any problem with monitor results - pt aware that hitting the monitor would not cause problems with the results and also made aware of upcoming stress test appt - pt appreciative

## 2019-07-10 NOTE — Telephone Encounter (Signed)
Benjamin Mason called in regarding to when he was wearing his heart monitor he started having problems with swallowing . He wanted to check with Dr. Domenic Polite about wearing the monitor. He stated that he to bang on his chest around the monitor area due to having an issue with his swallowing.

## 2019-07-15 DIAGNOSIS — N183 Chronic kidney disease, stage 3 unspecified: Secondary | ICD-10-CM | POA: Diagnosis not present

## 2019-07-15 DIAGNOSIS — R739 Hyperglycemia, unspecified: Secondary | ICD-10-CM | POA: Diagnosis not present

## 2019-07-15 DIAGNOSIS — E7800 Pure hypercholesterolemia, unspecified: Secondary | ICD-10-CM | POA: Diagnosis not present

## 2019-07-15 DIAGNOSIS — I10 Essential (primary) hypertension: Secondary | ICD-10-CM | POA: Diagnosis not present

## 2019-07-15 DIAGNOSIS — E875 Hyperkalemia: Secondary | ICD-10-CM | POA: Diagnosis not present

## 2019-07-16 ENCOUNTER — Encounter: Payer: Self-pay | Admitting: *Deleted

## 2019-07-18 ENCOUNTER — Other Ambulatory Visit: Payer: Self-pay

## 2019-07-18 ENCOUNTER — Encounter (HOSPITAL_COMMUNITY): Payer: Self-pay

## 2019-07-18 ENCOUNTER — Encounter (HOSPITAL_COMMUNITY)
Admission: RE | Admit: 2019-07-18 | Discharge: 2019-07-18 | Disposition: A | Discharge status: Home / Self Care | Payer: Medicare Other | Source: Ambulatory Visit | Attending: Cardiology | Admitting: Cardiology

## 2019-07-18 ENCOUNTER — Ambulatory Visit (HOSPITAL_COMMUNITY)
Admission: RE | Admit: 2019-07-18 | Discharge: 2019-07-18 | Disposition: A | Discharge status: Home / Self Care | Payer: Medicare Other | Source: Ambulatory Visit | Attending: Cardiology | Admitting: Cardiology

## 2019-07-18 DIAGNOSIS — I493 Ventricular premature depolarization: Secondary | ICD-10-CM | POA: Diagnosis not present

## 2019-07-18 DIAGNOSIS — I25119 Atherosclerotic heart disease of native coronary artery with unspecified angina pectoris: Secondary | ICD-10-CM | POA: Diagnosis not present

## 2019-07-18 LAB — NM MYOCAR MULTI W/SPECT W/WALL MOTION / EF
LV dias vol: 130 mL (ref 62–150)
LV sys vol: 69 mL
Peak HR: 70 {beats}/min
RATE: 0.39
Rest HR: 61 {beats}/min
SDS: 1
SRS: 1
SSS: 2
TID: 0.99

## 2019-07-18 MED ORDER — TECHNETIUM TC 99M TETROFOSMIN IV KIT
10.0000 | PACK | Freq: Once | INTRAVENOUS | Status: AC | PRN
  Administered 2019-07-18: 10.4 via INTRAVENOUS

## 2019-07-18 MED ORDER — TECHNETIUM TC 99M TETROFOSMIN IV KIT
30.0000 | PACK | Freq: Once | INTRAVENOUS | Status: AC | PRN
  Administered 2019-07-18: 33 via INTRAVENOUS

## 2019-07-18 MED ORDER — SODIUM CHLORIDE FLUSH 0.9 % IV SOLN
INTRAVENOUS | Status: AC
  Administered 2019-07-18: 10 mL via INTRAVENOUS
  Filled 2019-07-18: qty 10

## 2019-07-18 MED ORDER — REGADENOSON 0.4 MG/5ML IV SOLN
INTRAVENOUS | Status: AC
  Administered 2019-07-18: 0.4 mg via INTRAVENOUS
  Filled 2019-07-18: qty 5

## 2019-07-22 ENCOUNTER — Telehealth: Payer: Self-pay | Admitting: *Deleted

## 2019-07-22 DIAGNOSIS — I10 Essential (primary) hypertension: Secondary | ICD-10-CM | POA: Diagnosis not present

## 2019-07-22 DIAGNOSIS — I4891 Unspecified atrial fibrillation: Secondary | ICD-10-CM | POA: Diagnosis not present

## 2019-07-22 DIAGNOSIS — G3184 Mild cognitive impairment, so stated: Secondary | ICD-10-CM | POA: Diagnosis not present

## 2019-07-22 DIAGNOSIS — E782 Mixed hyperlipidemia: Secondary | ICD-10-CM | POA: Diagnosis not present

## 2019-07-22 DIAGNOSIS — I2510 Atherosclerotic heart disease of native coronary artery without angina pectoris: Secondary | ICD-10-CM | POA: Diagnosis not present

## 2019-07-22 DIAGNOSIS — Z6824 Body mass index (BMI) 24.0-24.9, adult: Secondary | ICD-10-CM | POA: Diagnosis not present

## 2019-07-22 DIAGNOSIS — K2100 Gastro-esophageal reflux disease with esophagitis, without bleeding: Secondary | ICD-10-CM | POA: Diagnosis not present

## 2019-07-22 NOTE — Telephone Encounter (Signed)
-----   Message from Satira Sark, MD sent at 07/18/2019  3:50 PM EST ----- Results reviewed.  I reviewed the study images as well.  Although there is description of prior inferior/inferoseptal infarct scar, this could also be related to soft tissue attenuation, particularly since we know his recent echocardiogram showed normal LVEF (not mildly reduced) and he had no focal wall motion abnormalities in that distribution.  His cardiac catheterization from 2 years ago showed only mild nonobstructive CAD.  Let us continue with the current plan for now.  Make sure that follow-up is already scheduled.

## 2019-07-22 NOTE — Telephone Encounter (Signed)
Daughter informed. Copy sent to PCP

## 2019-07-23 ENCOUNTER — Other Ambulatory Visit: Payer: Self-pay

## 2019-07-23 ENCOUNTER — Ambulatory Visit (INDEPENDENT_AMBULATORY_CARE_PROVIDER_SITE_OTHER): Payer: Medicare Other | Admitting: Cardiology

## 2019-07-23 ENCOUNTER — Encounter: Payer: Self-pay | Admitting: Cardiology

## 2019-07-23 VITALS — BP 137/57 | HR 70 | Ht 72.0 in | Wt 182.4 lb

## 2019-07-23 DIAGNOSIS — I25119 Atherosclerotic heart disease of native coronary artery with unspecified angina pectoris: Secondary | ICD-10-CM

## 2019-07-23 DIAGNOSIS — I4821 Permanent atrial fibrillation: Secondary | ICD-10-CM | POA: Diagnosis not present

## 2019-07-23 DIAGNOSIS — I493 Ventricular premature depolarization: Secondary | ICD-10-CM | POA: Diagnosis not present

## 2019-07-23 NOTE — Patient Instructions (Addendum)

## 2019-07-23 NOTE — Progress Notes (Signed)
Cardiology Office Note  Date: 07/23/2019   ID: Benjamin Mason, DOB 1931-04-10, MRN [id number]  PCP:  Manon Hilding, MD  Cardiologist:  Rozann Lesches, MD Electrophysiologist:  None   Chief Complaint  Patient presents with  . Cardiac follow-up    History of Present Illness: Benjamin Mason is an 85 y.o. male last seen in November.  He presents today with his wife for a follow-up visit.  He tells me that he feels well, does not report any chest pain, no palpitations or syncope.  Still has trouble with his memory as before.  Interval cardiac testing is reviewed below.  Cardiac monitor showed atrial fibrillation with frequent PVCs.  Echocardiography revealed normal LVEF at 55 to 60%, normal RV contraction, moderate biatrial enlargement, mildly sclerotic aortic valve with mild aortic regurgitation.  Myoview indicated region of possible inferior/inferoseptal scar versus soft tissue attenuation, suspect that reduced LVEF in the range of 45 to 54% may have been related to difficulties with gating in the setting of atrial fibrillation and frequent PVCs.  The PVCs are positive in the inferior leads and look to be coming from the outflow tract most likely.  Today we went over the results of his recent testing.  At this point plan will be to continue observation on beta-blocker.  Past Medical History:  Diagnosis Date  . Anxiety   . Aortic regurgitation    Mild to moderate  . Cancer (HCC)    Skin  . CKD (chronic kidney disease) stage 3, GFR 30-59 ml/min   . Coronary atherosclerosis of native coronary artery    BMS to RCA and LAD 1995  . Essential hypertension   . IBS (irritable bowel syndrome)   . Lactose intolerance   . Mitral regurgitation    Moderate  . Mixed hyperlipidemia   . Permanent atrial fibrillation [facility])     Past Surgical History:  Procedure Laterality Date  . BALLOON DILATION  08/03/2012   Procedure: BALLOON DILATION;  Surgeon: Rogene Houston, MD;  [facility];  Service: Endoscopy;  Laterality: N/A;  . COLONOSCOPY WITH ESOPHAGOGASTRODUODENOSCOPY (EGD)  08/03/2012   Procedure: COLONOSCOPY WITH ESOPHAGOGASTRODUODENOSCOPY (EGD);  Surgeon: Rogene Houston, MD;  [facility];  Service: Endoscopy;  Laterality: N/A;  215  . LEFT HEART CATH AND CORONARY ANGIOGRAPHY N/A 11/18/2016   Procedure: Left Heart Cath and Coronary Angiography;  Surgeon: Peter M Martinique, MD;  Location: [place] CV LAB;  Service: Cardiovascular;  Laterality: N/A;  . LEFT INGUINAL HERNIA    . MALONEY DILATION  08/03/2012   Procedure: MALONEY DILATION;  Surgeon: Rogene Houston, MD;  [facility];  Service: Endoscopy;  Laterality: N/A;  . PENILE PROSTHESIS IMPLANT    . SAVORY DILATION  08/03/2012   Procedure: SAVORY DILATION;  Surgeon: Rogene Houston, MD;  [facility];  Service: Endoscopy;  Laterality: N/A;    Current Outpatient Medications  Medication Sig Dispense Refill  . acetaminophen (TYLENOL ARTHRITIS PAIN) 650 MG CR tablet Take 1,300 mg by mouth 2 (two) times daily.     Marland Kitchen amLODipine (NORVASC) 2.5 MG tablet TAKE ONE TABLET BY MOUTH DAILY 90 tablet 3  . apixaban (ELIQUIS) 2.5 MG TABS tablet Take 1 tablet (2.5 mg total) by mouth 2 (two) times daily. 60 tablet 6  . Apoaequorin (PREVAGEN) 10 MG CAPS Take by mouth daily.    Marland Kitchen atenolol (TENORMIN) 50 MG tablet TAKE ONE TABLET BY MOUTH DAILY (MORNING) 90  tablet 1  . cholecalciferol (VITAMIN D3) 25 MCG (1000 UT) tablet Take 1,000 Units by mouth daily.    . finasteride (PROSCAR) 5 MG tablet Take 5 mg by mouth daily.      . Lactase 9000 units CHEW Chew 1 tablet by mouth See admin instructions. Pt takes only as needed when eating any dairy products    . MELATONIN PO Take 9 mg by mouth at bedtime. Melatonin 63m    . Multiple Vitamin (MULTIVITAMIN) tablet Take 1 tablet by mouth daily.    .Marland KitchenNITROSTAT 0.4 MG SL tablet PLACE ONE TABLET UNDER THE TONGUE EVERY 5 MINUTES AS NEEDED FOR CHEST PAIN 25  tablet 3  . pantoprazole (PROTONIX) 40 MG tablet Take 1 tablet by mouth daily.    . Probiotic Product ([place] Take by mouth. This is Ultraflora IB - He takes PO 4 times weekly.    . simvastatin (ZOCOR) 20 MG tablet Take 10 mg by mouth at bedtime.    . Tamsulosin HCl (FLOMAX) 0.4 MG CAPS Take 0.4 mg by mouth daily.      . Testosterone Cypionate 200 MG/ML KIT Inject 100-150 mg into the muscle every 14 (fourteen) days. For IM use only.   Pt uses 0.5 to 0.7 mL     No current facility-administered medications for this visit.    Allergies:  Patient has no known allergies.   Social History: The patient  reports that he quit smoking about 56 years ago. His smoking use included cigarettes. He has a 50.00 pack-year smoking history. He has never used smokeless tobacco. He reports that he does not drink alcohol or use drugs.   ROS:  Please see the history of present illness. Otherwise, complete review of systems is positive for memory loss.  All other systems are reviewed and negative.   Physical Exam: VS:  BP (!) 137/57   Pulse 70   Ht 6' (1.829 m)   Wt 182 lb 6.4 oz (82.7 kg)   SpO2 98%   BMI 24.74 kg/m , BMI Body mass index is 24.74 kg/m.  Wt Readings from Last 3 Encounters:  07/23/19 182 lb 6.4 oz (82.7 kg)  06/17/19 179 lb 3.2 oz (81.3 kg)  01/30/19 167 lb (75.8 kg)    General: Elderly male, appears comfortable at rest. HEENT: Conjunctiva and lids normal, wearing a mask. Neck: Supple, no elevated JVP or carotid bruits, no thyromegaly. Lungs: Clear to auscultation, nonlabored breathing at rest. Cardiac: Irregular, no S3, soft systolic murmur. Abdomen: Soft, nontender, bowel sounds present. Extremities: No pitting edema, distal pulses 2+. Skin: Warm and dry. Musculoskeletal: No kyphosis. Neuropsychiatric: Alert and oriented x3, affect grossly appropriate.  ECG:  An ECG dated 06/17/2019 was personally reviewed today and demonstrated:  Atrial fibrillation with  ventricular bigeminy.  Recent Labwork:  October 2020: AST 20, ALT 13, BUN 29, creatinine 1.76, hemoglobin 11.8, magnesium 2.0, potassium 4.7, platelets 137, troponin T negative  Other Studies Reviewed Today:  Cardiac monitor 06/17/2019: Zio patch reviewed, 2 days and 2 hours analyzed.  Atrial fibrillation was the predominant rhythm, although junctional rhythm also looked to be present particularly with episodes of ventricular bigeminy.  Heart rate ranged from 55 bpm up to 119 bpm with average heart rate 69 bpm.  Frequent PVCs were noted representing approximately 30% of total beats.  Brief burst of NSVT lasting 3 beats.  There were no pauses.  Echocardiogram 07/09/2019:  1. Left ventricular ejection fraction, by visual estimation, is 55 to 60%.  The left ventricle has normal function. There is no left ventricular hypertrophy.  2. Left ventricular diastolic parameters are indeterminate.  3. Global right ventricle has normal systolic function.The right ventricular size is mildly enlarged. No increase in right ventricular wall thickness.  4. Left atrial size was moderately dilated.  5. Right atrial size was moderately dilated.  6. Mild mitral annular calcification.  7. Mild to moderate aortic valve annular calcification.  8. The mitral valve is degenerative. Mild mitral valve regurgitation.  9. The tricuspid valve is grossly normal. Tricuspid valve regurgitation is mild. 10. The aortic valve is tricuspid. Aortic valve regurgitation is mild. Mild aortic valve sclerosis without stenosis. 11. The pulmonic valve was grossly normal. Pulmonic valve regurgitation is mild. 12. Normal pulmonary artery systolic pressure. 13. The tricuspid regurgitant velocity is 2.23 m/s, and with an assumed right atrial pressure of 8 mmHg, the estimated right ventricular systolic pressure is normal at 27.9 mmHg. 14. The inferior vena cava is normal in size with <50% respiratory variability, suggesting right atrial  pressure of 8 mmHg.  Lexiscan Myoview 07/18/2019:  There was no ST segment deviation noted during stress.  Findings consistent with prior inferior/inferoseptal myocardial infarction. There is no current ischemia.  This is an intermediate risk study. Risk based on decreased LVEF, there is no current ischemia. Recommend correlating LVEF with echo.  The left ventricular ejection fraction is mildly decreased (45-54%).  Assessment and Plan:  1.  Frequent PVCs, approximately 30% of total beats by interval cardiac monitor.  He is asymptomatic in terms of any sense of palpitations and has had no chest pain or syncope.  Follow-up echocardiogram shows normal LVEF at 55 to 60%.  Myoview did not reveal any large ischemic territories with possible inferior/inferoseptal scar versus soft tissue attenuation.  PVCs are positive in the inferior leads suggesting outflow tract origin.  Plan will be to continue beta-blocker at this point.  2.  Permanent atrial fibrillation.  Continue Eliquis for stroke prophylaxis along with beta-blocker.  3.  CKD stage III, last creatinine 1.76  4.  Mild to moderate nonobstructive CAD by cardiac catheterization in 2018.  He reports no definite angina symptoms, no pathologic inferior Q waves by ECG.  Myoview raised possibility of inferior/inferoseptal scar although attenuation artifact also possible.  No focal wall motion normality by echocardiogram.  Medication Adjustments/Labs and Tests Ordered: Current medicines are reviewed at length with the patient today.  Concerns regarding medicines are outlined above.   Tests Ordered: No orders of the defined types were placed in this encounter.   Medication Changes: No orders of the defined types were placed in this encounter.   Disposition:  Follow up 6 months in the [place] office.  Signed, Satira Sark, MD, [facility] 07/23/2019 4:36 PM    [facility] [place], [place], [place] [address]  Phone: [phone]; Fax: [phone]

## 2019-07-25 ENCOUNTER — Other Ambulatory Visit: Payer: Self-pay | Admitting: Cardiology

## 2019-08-13 DIAGNOSIS — U071 COVID-19: Secondary | ICD-10-CM | POA: Diagnosis not present

## 2019-08-28 ENCOUNTER — Ambulatory Visit (INDEPENDENT_AMBULATORY_CARE_PROVIDER_SITE_OTHER): Payer: Medicare Other | Admitting: *Deleted

## 2019-08-28 ENCOUNTER — Other Ambulatory Visit: Payer: Self-pay

## 2019-08-28 ENCOUNTER — Telehealth: Payer: Self-pay | Admitting: Cardiology

## 2019-08-28 DIAGNOSIS — I4821 Permanent atrial fibrillation: Secondary | ICD-10-CM

## 2019-08-28 NOTE — Telephone Encounter (Signed)
Pts daughter, Maudie Mercury, advised to bring the pt to the [place] office today per Alma Friendly, at 3:30 pm for an EKG, she is a Marine scientist and since he is 71 she will be coming with him. But, advised his wife cannot accompany due to trying to abide by COVID restrictions.

## 2019-08-28 NOTE — Telephone Encounter (Signed)
I would have him come by the [facility] office today if possible to get an ECG.  Need to clarify rhythm.  He has atrial fibrillation and also frequent PVCs.  This will help [place] determine any potential medication adjustments.

## 2019-08-28 NOTE — Progress Notes (Signed)
Presents to office for EKG per recent phone note and request by Dr. Domenic Polite. Brought home pulse oximeter for comparison of HR.  Home Pulse oximeter HR 56 SpO2 95

## 2019-08-28 NOTE — Telephone Encounter (Signed)
Pts daughter, Maudie Mercury on [place] and a nurse, called tor report that the pt has had COVID for the past 15 days but he has tested negative yesterday... he has done well but on Day 12 started to report to her that he was feeling very tired and taking much longer than usual naps during the day... his O2 sat is 97, BP 109/64 but his HR has been in the 40's... she checked it manually to verify.   He has not been eating well, his weight is at its lowest it has been in many years 157 lbs. and he is c/o increased fatigue. She is very concerned about his HR, it felt a little irregular but he is also not c/o palpitations.    He takes his Norvasc at bedtime, and his Atenolol 50 mg every morning.   She will continue to monitor and will forward to Dr. Domenic Polite for review and recommendations.

## 2019-08-28 NOTE — Telephone Encounter (Signed)
Patient's daughter calling asking about normal heart rate for her dad.  He recently had covid and his heart rate has been staying around 40

## 2019-08-29 ENCOUNTER — Telehealth: Payer: Self-pay | Admitting: *Deleted

## 2019-08-29 NOTE — Telephone Encounter (Signed)
Daughter informed and verbalized understanding

## 2019-08-29 NOTE — Telephone Encounter (Signed)
-----   Message from Satira Sark, MD sent at 08/28/2019  4:57 PM EST ----- Results reviewed.  Tracing shows probable atrial fibrillation with ventricular bigeminy, heart rate actually is in the 70s.  I wonder whether the low heart rates represent under counting in the setting of frequent PVCs.  Would not reduce atenolol at this time.  See recent telephone note.

## 2019-09-02 ENCOUNTER — Other Ambulatory Visit: Payer: Self-pay | Admitting: Cardiology

## 2019-09-09 DIAGNOSIS — L259 Unspecified contact dermatitis, unspecified cause: Secondary | ICD-10-CM | POA: Diagnosis not present

## 2019-10-15 DIAGNOSIS — H353132 Nonexudative age-related macular degeneration, bilateral, intermediate dry stage: Secondary | ICD-10-CM | POA: Diagnosis not present

## 2019-10-15 DIAGNOSIS — H2101 Hyphema, right eye: Secondary | ICD-10-CM | POA: Diagnosis not present

## 2019-10-15 DIAGNOSIS — T8522XS Displacement of intraocular lens, sequela: Secondary | ICD-10-CM | POA: Diagnosis not present

## 2019-10-15 DIAGNOSIS — H43813 Vitreous degeneration, bilateral: Secondary | ICD-10-CM | POA: Diagnosis not present

## 2019-11-20 ENCOUNTER — Other Ambulatory Visit: Payer: Self-pay | Admitting: Cardiology

## 2019-11-20 DIAGNOSIS — H524 Presbyopia: Secondary | ICD-10-CM | POA: Diagnosis not present

## 2019-11-22 DIAGNOSIS — H43813 Vitreous degeneration, bilateral: Secondary | ICD-10-CM | POA: Diagnosis not present

## 2019-11-22 DIAGNOSIS — H35372 Puckering of macula, left eye: Secondary | ICD-10-CM | POA: Diagnosis not present

## 2019-11-22 DIAGNOSIS — H353132 Nonexudative age-related macular degeneration, bilateral, intermediate dry stage: Secondary | ICD-10-CM | POA: Diagnosis not present

## 2019-11-22 DIAGNOSIS — T8522XS Displacement of intraocular lens, sequela: Secondary | ICD-10-CM | POA: Diagnosis not present

## 2019-12-06 DIAGNOSIS — R110 Nausea: Secondary | ICD-10-CM | POA: Diagnosis not present

## 2019-12-06 DIAGNOSIS — R42 Dizziness and giddiness: Secondary | ICD-10-CM | POA: Diagnosis not present

## 2019-12-06 DIAGNOSIS — I509 Heart failure, unspecified: Secondary | ICD-10-CM | POA: Diagnosis not present

## 2019-12-06 DIAGNOSIS — I4891 Unspecified atrial fibrillation: Secondary | ICD-10-CM | POA: Diagnosis not present

## 2019-12-06 DIAGNOSIS — I499 Cardiac arrhythmia, unspecified: Secondary | ICD-10-CM | POA: Diagnosis not present

## 2019-12-07 ENCOUNTER — Inpatient Hospital Stay
Admission: AD | Admit: 2019-12-07 | Payer: Medicare Other | Source: Other Acute Inpatient Hospital | Admitting: Internal Medicine

## 2019-12-07 DIAGNOSIS — R110 Nausea: Secondary | ICD-10-CM | POA: Diagnosis not present

## 2019-12-07 DIAGNOSIS — R42 Dizziness and giddiness: Secondary | ICD-10-CM | POA: Diagnosis not present

## 2019-12-09 ENCOUNTER — Telehealth: Payer: Self-pay | Admitting: Cardiology

## 2019-12-09 DIAGNOSIS — R42 Dizziness and giddiness: Secondary | ICD-10-CM | POA: Diagnosis not present

## 2019-12-09 DIAGNOSIS — R110 Nausea: Secondary | ICD-10-CM | POA: Diagnosis not present

## 2019-12-09 NOTE — Telephone Encounter (Signed)
Error

## 2019-12-10 ENCOUNTER — Telehealth: Payer: Self-pay | Admitting: *Deleted

## 2019-12-10 DIAGNOSIS — I130 Hypertensive heart and chronic kidney disease with heart failure and stage 1 through stage 4 chronic kidney disease, or unspecified chronic kidney disease: Secondary | ICD-10-CM | POA: Diagnosis not present

## 2019-12-10 DIAGNOSIS — R42 Dizziness and giddiness: Secondary | ICD-10-CM | POA: Diagnosis not present

## 2019-12-10 DIAGNOSIS — Z7901 Long term (current) use of anticoagulants: Secondary | ICD-10-CM | POA: Diagnosis not present

## 2019-12-10 DIAGNOSIS — R55 Syncope and collapse: Secondary | ICD-10-CM | POA: Diagnosis not present

## 2019-12-10 DIAGNOSIS — I5031 Acute diastolic (congestive) heart failure: Secondary | ICD-10-CM | POA: Diagnosis not present

## 2019-12-10 DIAGNOSIS — N183 Chronic kidney disease, stage 3 unspecified: Secondary | ICD-10-CM | POA: Diagnosis not present

## 2019-12-10 DIAGNOSIS — Z7982 Long term (current) use of aspirin: Secondary | ICD-10-CM | POA: Diagnosis not present

## 2019-12-10 DIAGNOSIS — N179 Acute kidney failure, unspecified: Secondary | ICD-10-CM | POA: Diagnosis not present

## 2019-12-10 DIAGNOSIS — Z20822 Contact with and (suspected) exposure to covid-19: Secondary | ICD-10-CM | POA: Diagnosis not present

## 2019-12-10 DIAGNOSIS — I4891 Unspecified atrial fibrillation: Secondary | ICD-10-CM | POA: Diagnosis not present

## 2019-12-10 DIAGNOSIS — R110 Nausea: Secondary | ICD-10-CM | POA: Diagnosis not present

## 2019-12-10 DIAGNOSIS — N189 Chronic kidney disease, unspecified: Secondary | ICD-10-CM | POA: Diagnosis not present

## 2019-12-10 DIAGNOSIS — I482 Chronic atrial fibrillation, unspecified: Secondary | ICD-10-CM | POA: Diagnosis not present

## 2019-12-10 NOTE — Telephone Encounter (Signed)
Daughter, Maudie Mercury informed and verbalized understanding.

## 2019-12-10 NOTE — Telephone Encounter (Signed)
Thank you, I have reached out to [place] myself as well to try and expedite transfer.

## 2019-12-10 NOTE — Telephone Encounter (Signed)
Thank you for this update.  As you know, I am working in the office in [place] today.  I would ask that you help me with this situation by contacting Trish at [facility] and seeing if Mr. Deren is on our transfer list and that our service is aware of him.

## 2019-12-10 NOTE — Telephone Encounter (Signed)
Per Wannetta Sender, patient is on the transfer list since Saturday and at this time-there are no available beds.  Patient's daughter contacted and informed. Verbalized understanding.

## 2019-12-10 NOTE — Telephone Encounter (Signed)
Reports being admitted to [facility] on Friday 12/06/19 with "heart problems". Reports that he is still in the ED awaiting for transfer to [facility]. Patient is asking that Dr. Domenic Polite give him a call to help with his transfer. Advised patient that this would need to be coordinated with his providers at [facility]. Patient continued to insist on Domenic Polite calling him.

## 2019-12-15 DIAGNOSIS — R9431 Abnormal electrocardiogram [ECG] [EKG]: Secondary | ICD-10-CM | POA: Diagnosis not present

## 2019-12-15 DIAGNOSIS — I472 Ventricular tachycardia: Secondary | ICD-10-CM | POA: Diagnosis not present

## 2019-12-16 ENCOUNTER — Ambulatory Visit (INDEPENDENT_AMBULATORY_CARE_PROVIDER_SITE_OTHER): Payer: Medicare Other | Admitting: Family Medicine

## 2019-12-16 ENCOUNTER — Encounter: Payer: Self-pay | Admitting: *Deleted

## 2019-12-16 ENCOUNTER — Other Ambulatory Visit: Payer: Self-pay

## 2019-12-16 ENCOUNTER — Encounter: Payer: Self-pay | Admitting: Family Medicine

## 2019-12-16 VITALS — BP 122/70 | HR 53 | Ht 72.0 in | Wt 169.0 lb

## 2019-12-16 DIAGNOSIS — N1832 Chronic kidney disease, stage 3b: Secondary | ICD-10-CM

## 2019-12-16 DIAGNOSIS — I493 Ventricular premature depolarization: Secondary | ICD-10-CM

## 2019-12-16 DIAGNOSIS — I25119 Atherosclerotic heart disease of native coronary artery with unspecified angina pectoris: Secondary | ICD-10-CM

## 2019-12-16 DIAGNOSIS — I4821 Permanent atrial fibrillation: Secondary | ICD-10-CM | POA: Diagnosis not present

## 2019-12-16 DIAGNOSIS — H40053 Ocular hypertension, bilateral: Secondary | ICD-10-CM | POA: Diagnosis not present

## 2019-12-16 NOTE — Patient Instructions (Signed)
Medication Instructions:  Continue all current medications.  Labwork: none  Testing/Procedures: none  Follow-Up: 3 months   Any Other Special Instructions Will Be Listed Below (If Applicable).  If you need a refill on your cardiac medications before your next appointment, please call your pharmacy.  

## 2019-12-16 NOTE — Progress Notes (Signed)
Cardiology Office Note  Date: 12/16/2019   ID: Benjamin Mason, DOB 03-23-1931, MRN [id number]  PCP:  Manon Hilding, MD  Cardiologist:  Rozann Lesches, MD Electrophysiologist:  None   Chief Complaint: Follow-up frequent PVCs, permanent atrial fibrillation, CKD stage III, mild to moderate nonobstructive CAD by cardiac catheterization 2018.  History of Present Illness: Benjamin Mason is a 86 y.o. male with a history of frequent PVCs, permanent atrial fibrillation, CKD stage III, mild to moderate nonobstructive CAD by cardiac catheterization 2018.  Last seen by Dr. Domenic Polite via office visit on 07/23/2019.  He was having frequent PVCs approximately 30%  by interval cardiac monitor.  He was asymptomatic in terms of any sense of palpitations and had no chest pains or syncope.  Follow-up echo showed normal EF 55-60.  Myoview did not reveal any large ischemic territories.  PVCs were positive in inferior leads suggesting outflow tract origin.  He was continuing Eliquis for stroke prophylaxis along with beta-blocker.  His CKD showed a creatinine of 1.76.  Patient is here with his son-in-law and his wife.  He recently had a hospital visit to [facility] for unstable gait.  He was found to apparently be in fluid overload with a BNP of 4000 on arrival to ED.  He was treated with IV Lasix with decrease of BNP down to 2800.  We do not have the records, diagnostics or lab work from that visit.  In the process of attempting to obtain those records.  There apparently was suspicion that he had atrial fibrillation with RVR.  He is now wearing a Zio patch prescribed by the cardiologist at [facility] to check for any tacky or bradycardia arrhythmias.  He denied at presentation having any sensation of rapid heart rates, palpitations, or arrhythmias, fluttering of his heart.  He states his primary symptom was inability to walk in a straight line and being unsteady on his feet.  He apparently had troponins drawn which  were basically flat.  He was diuresed and discharged.  His son-in-law who is with him states he did have an echocardiogram.  Patient states he feels fine now his heart rate today is 53.  Blood pressure 122/70.  He denies any progressive anginal or exertional symptoms, palpitations or arrhythmias, orthostatic symptoms or presyncope/syncopal symptoms, PND, orthopnea, lower extremity edema, weight gain.  States he actually has lost a few pounds since discharge from the hospital.  According to his son-in-law and daughter who he is texting on the phone the patient's troponin levels were not elevated.  Apparently patient had an echocardiogram during his recent hospital stay.  Previous echo in November 2020 showed normal EF of 55 to 60% and mild MR and TR.   Past Medical History:  Diagnosis Date  . Anxiety   . Aortic regurgitation    Mild to moderate  . Cancer (HCC)    Skin  . CKD (chronic kidney disease) stage 3, GFR 30-59 ml/min   . Coronary atherosclerosis of native coronary artery    BMS to RCA and LAD 1995  . Essential hypertension   . IBS (irritable bowel syndrome)   . Lactose intolerance   . Mitral regurgitation    Moderate  . Mixed hyperlipidemia   . Permanent atrial fibrillation [facility])     Past Surgical History:  Procedure Laterality Date  . BALLOON DILATION  08/03/2012   Procedure: BALLOON DILATION;  Surgeon: Rogene Houston, MD;  [facility];  Service: Endoscopy;  Laterality: N/A;  .  COLONOSCOPY WITH ESOPHAGOGASTRODUODENOSCOPY (EGD)  08/03/2012   Procedure: COLONOSCOPY WITH ESOPHAGOGASTRODUODENOSCOPY (EGD);  Surgeon: Rogene Houston, MD;  [facility];  Service: Endoscopy;  Laterality: N/A;  215  . LEFT HEART CATH AND CORONARY ANGIOGRAPHY N/A 11/18/2016   Procedure: Left Heart Cath and Coronary Angiography;  Surgeon: Peter M Martinique, MD;  Location: [place] CV LAB;  Service: Cardiovascular;  Laterality: N/A;  . LEFT INGUINAL HERNIA    . MALONEY DILATION   08/03/2012   Procedure: MALONEY DILATION;  Surgeon: Rogene Houston, MD;  [facility];  Service: Endoscopy;  Laterality: N/A;  . PENILE PROSTHESIS IMPLANT    . SAVORY DILATION  08/03/2012   Procedure: SAVORY DILATION;  Surgeon: Rogene Houston, MD;  [facility];  Service: Endoscopy;  Laterality: N/A;    Current Outpatient Medications  Medication Sig Dispense Refill  . acetaminophen (TYLENOL ARTHRITIS PAIN) 650 MG CR tablet Take 1,300 mg by mouth 2 (two) times daily.     Marland Kitchen amLODipine (NORVASC) 2.5 MG tablet TAKE ONE TABLET BY MOUTH DAILY (BEDTIME) 90 tablet 3  . Apoaequorin (PREVAGEN) 10 MG CAPS Take by mouth daily.    Marland Kitchen atenolol (TENORMIN) 50 MG tablet TAKE ONE TABLET BY MOUTH DAILY (MORNING) 90 tablet 1  . cholecalciferol (VITAMIN D3) 25 MCG (1000 UT) tablet Take 1,000 Units by mouth daily.    Marland Kitchen ELIQUIS 2.5 MG TABS tablet TAKE ONE TABLET BY MOUTH TWICE DAILY. (MORNING ,BEDTIME) 60 tablet 6  . finasteride (PROSCAR) 5 MG tablet Take 5 mg by mouth daily.      . Lactase 9000 units CHEW Chew 1 tablet by mouth See admin instructions. Pt takes only as needed when eating any dairy products    . MELATONIN PO Take 9 mg by mouth at bedtime. Melatonin 69m    . Multiple Vitamin (MULTIVITAMIN) tablet Take 1 tablet by mouth daily.    .Marland KitchenNITROSTAT 0.4 MG SL tablet PLACE ONE TABLET UNDER THE TONGUE EVERY 5 MINUTES AS NEEDED FOR CHEST PAIN 25 tablet 3  . pantoprazole (PROTONIX) 40 MG tablet Take 1 tablet by mouth daily.    . simvastatin (ZOCOR) 20 MG tablet Take 10 mg by mouth at bedtime.    . Tamsulosin HCl (FLOMAX) 0.4 MG CAPS Take 0.4 mg by mouth daily.      . Testosterone Cypionate 200 MG/ML KIT Inject 100-150 mg into the muscle every 14 (fourteen) days. For IM use only.   Pt uses 0.5 to 0.7 mL     No current facility-administered medications for this visit.   Allergies:  Patient has no known allergies.   Social History: The patient  reports that he quit smoking about 57 years  ago. His smoking use included cigarettes. He has a 50.00 pack-year smoking history. He has never used smokeless tobacco. He reports that he does not drink alcohol or use drugs.   Family History: The patient's family history includes Heart attack in his father.   ROS:  Please see the history of present illness. Otherwise, complete review of systems is positive for none.  All other systems are reviewed and negative.   Physical Exam: VS:  BP 122/70   Pulse (!) 53   Ht 6' (1.829 m)   Wt 169 lb (76.7 kg)   SpO2 97%   BMI 22.92 kg/m , BMI Body mass index is 22.92 kg/m.  Wt Readings from Last 3 Encounters:  12/16/19 169 lb (76.7 kg)  07/23/19 182 lb 6.4  oz (82.7 kg)  06/17/19 179 lb 3.2 oz (81.3 kg)    General: Patient appears comfortable at rest. Neck: Supple, no elevated JVP or carotid bruits, no thyromegaly. Lungs: Clear to auscultation, nonlabored breathing at rest. Cardiac: Irregularly irregular rate and rhythm, no S3 or significant systolic murmur, no pericardial rub. Abdomen: Soft, nontender, no hepatomegaly, bowel sounds present, no guarding or rebound. Extremities: No pitting edema, distal pulses 2+. Skin: Warm and dry. Musculoskeletal: No kyphosis. Neuropsychiatric: Alert and oriented x3, affect grossly appropriate.  ECG:  An ECG dated 08/28/2019 was personally reviewed today and demonstrated:  Probable atrial fibrillation with ventricular bigeminy.  Heart rate 70  Recent Labwork: No results found for requested labs within last 8760 hours.  No results found for: CHOL, TRIG, HDL, CHOLHDL, VLDL, LDLCALC, LDLDIRECT  Other Studies Reviewed Today: Cardiac monitor 06/17/2019: Zio patch reviewed, 2 days and 2 hours analyzed. Atrial fibrillation was the predominant rhythm, although junctional rhythm also looked to be present particularly with episodes of ventricular bigeminy. Heart rate ranged from 55 bpm up to 119 bpm with average heart rate 69 bpm. Frequent PVCs were noted  representing approximately 30% of total beats. Brief burst of NSVT lasting 3 beats. There were no pauses.  Echocardiogram 07/09/2019: 1. Left ventricular ejection fraction, by visual estimation, is 55 to 60%. The left ventricle has normal function. There is no left ventricular hypertrophy. 2. Left ventricular diastolic parameters are indeterminate. 3. Global right ventricle has normal systolic function.The right ventricular size is mildly enlarged. No increase in right ventricular wall thickness. 4. Left atrial size was moderately dilated. 5. Right atrial size was moderately dilated. 6. Mild mitral annular calcification. 7. Mild to moderate aortic valve annular calcification. 8. The mitral valve is degenerative. Mild mitral valve regurgitation. 9. The tricuspid valve is grossly normal. Tricuspid valve regurgitation is mild. 10. The aortic valve is tricuspid. Aortic valve regurgitation is mild. Mild aortic valve sclerosis without stenosis. 11. The pulmonic valve was grossly normal. Pulmonic valve regurgitation is mild. 12. Normal pulmonary artery systolic pressure. 13. The tricuspid regurgitant velocity is 2.23 m/s, and with an assumed right atrial pressure of 8 mmHg, the estimated right ventricular systolic pressure is normal at 27.9 mmHg. 14. The inferior vena cava is normal in size with <50% respiratory variability, suggesting right atrial pressure of 8 mmHg.  Lexiscan Myoview 07/18/2019:  There was no ST segment deviation noted during stress.  Findings consistent with prior inferior/inferoseptal myocardial infarction. There is no current ischemia.  This is an intermediate risk study. Risk based on decreased LVEF, there is no current ischemia. Recommend correlating LVEF with echo.  The left ventricular ejection fraction is mildly decreased (45-54%).   Assessment and Plan:  1. Frequent PVCs   2. Permanent atrial fibrillation (HCC)   3. Stage 3b chronic kidney disease     4. Coronary artery disease involving native coronary artery of native heart with angina pectoris ([place])     1. Frequent PVCs Last EKG at office showed ventricular bigeminy along with possible atrial fibrillation.  He is wearing a Zio XT patch prescribed by cardiologist at [facility] today during his visit.  2. Permanent atrial fibrillation [facility]) Patient had a recent hospital visit at [facility] for unsteady gait.  We do not have the records of that visit.  Apparently there was a suspicion that he had A. fib with RVR.  He is currently wearing a ZIO XT patch prescribed by the cardiologist from [facility].  Patient was apparently in  fluid overload secondary to rapid heart rate.  His initial BNP was greater than 4000.  He was diuresed with IV Lasix  and discharged after a 6-day hospital stay.  Patient states he feels much better today.  His heart rate is 53 on arrival.  It is irregularly irregular.  We will obtain all records from recent [facility] visit as well as results of ZIO monitor.  Continue atenolol 50 mg daily, Eliquis 2.5 mg daily.  3. Stage 3b chronic kidney disease Lab work on May 21, 2019 showed a creatinine of 1.78 and GFR of 37.  We will continue to monitor.  He had recent lab work from hospital admission 2 weeks ago at [facility].  We are in the process of obtaining those results  4. Coronary artery disease involving native coronary artery of native heart with angina pectoris [facility]) Patient had a left heart catheterization in 2018 showing nonobstructive coronary artery disease.  Has a remote history of stenting to proximal LAD in 1995.  He currently denies any progressive anginal or exertional symptoms.  Continue sublingual nitroglycerin as needed for chest pain.  Continue Zocor 10 mg daily.   Medication Adjustments/Labs and Tests Ordered: Current medicines are reviewed at length with the patient today.  Concerns regarding medicines are outlined above.    Disposition: Follow-up with Dr. Domenic Polite in 3 months  Signed, Levell July, NP 12/16/2019 4:56 PM    [place] [facility] [address], [place], [place] [address] Phone: [phone]; Fax: [phone]

## 2019-12-22 ENCOUNTER — Encounter: Payer: Self-pay | Admitting: Cardiology

## 2019-12-23 ENCOUNTER — Encounter: Payer: Self-pay | Admitting: *Deleted

## 2019-12-23 ENCOUNTER — Telehealth: Payer: Self-pay | Admitting: *Deleted

## 2019-12-23 DIAGNOSIS — I4821 Permanent atrial fibrillation: Secondary | ICD-10-CM

## 2019-12-23 DIAGNOSIS — I493 Ventricular premature depolarization: Secondary | ICD-10-CM

## 2019-12-23 NOTE — Telephone Encounter (Signed)
Patient informed and verbalized understanding of plan. 

## 2019-12-23 NOTE — Telephone Encounter (Signed)
Spoke with daughter who says that patient was eating lunch yesterday at 12:55 pm and did not have any symptoms. Advised that per Dr. Domenic Polite, patient should stop atenolol and will be referred to EP (ASAP). Verbalized understanding.

## 2019-12-23 NOTE — Telephone Encounter (Signed)
Thank you for the update.  I was aware that Mr. Tortora was recently at [facility], I had been contacted about trying to facilitate a transfer to [facility] from the ER at that facility. I communicated with our transfer coordinator, however there were no beds and he was ultimately admitted to [facility] after discussion with our [place] of the day.  I do not have complete information for review as yet, but he was apparently diuresed given concerns about fluid overload with elevated BNP, no clear evidence of ACS based on troponin levels.  He had presented with an unsteady gait as well and there were concerns about possible arrhythmogenic cause.  Dr. Candis Musa with [facility] cardiology placed a Zio patch at discharge.  Mr. Checchi did follow-up with Mr. Leonides Sake NP in the interim.  It sounds like he may have had an echocardiogram during his stay, we have requested this result.  Known cardiac history includes remote stenting of the RCA and LAD, nonobstructive disease by cardiac catheterization in 2018, normal LVEF by our most recent assessment, paroxysmal atrial fibrillation, and also frequent PVCs potentially from the outflow tract.  He has been on atenolol most recently.  We were able to obtain copy of recent rhythm report on outpatient Zio patch ordered by Dr. Candis Musa showing significant pauses occurring at 12:55 PM on May 9, longest of which was around 7 or 8 seconds.  Baseline rhythm looks to be atrial fibrillation with frequent PVCs largely in pattern of bigeminy.  Reportedly, patient was not clearly symptomatic during the recording.  For now I would recommend stopping atenolol, he needs to have an EP consultation ASAP for further evaluation.  Also need results of his recent echocardiogram to ensure stability in LVEF.  He did have a Myoview in December 2020 that showed no active ischemia, possible inferoseptal scar.

## 2019-12-25 NOTE — H&P (View-Only) (Signed)
ELECTROPHYSIOLOGY CONSULT NOTE  Patient ID: Benjamin Mason, MRN: [id number], DOB/AGE: 84-Sep-1932 84 y.o. Admit date: (Not on file) Date of Consult: 12/26/2019  Primary Physician: Manon Hilding, MD Primary Cardiologist: Karle Starch     MERRIL NAGY is a 86 y.o. male who is being seen today for the evaluation of pause  at the request of S McD.    HPI Benjamin Mason is a 86 y.o. male seen by telehealth for PVCs and a pause  Long standing afib and PVCs.  Was at [place] with his wife  Felt dizzy and difficulty with ambulation nauseated -- dizziness improved with sitting recurred with standing;  Orthostatic LH ( BP change) -- BNP >4000  Rx with diuretic without significant change and had no symptoms/imaging for CHF per family  Daughter had noted pauses on monitor ( See Below)   Admitted x 6d  Hx remote MI with LAD stenting 1995  DATE TEST EF   4/18 LHC    % CAD Nonobstructive  11/20 Echo   55-60 % BAE- mod  12/20 MYOVIEW 45-54%    Date Cr K Hgb  10/17 1.72 4.6    4/21 2.12 4.0 12  MCV>105 x 8+ years--B12 folate normal (scanned)  2013   Date PVCs  11/20 30.3%       ECG Afib with bigeminal PVCs with LBBB/Infer axis morphology--QS avR/avL with transition hard to appreciate, ( one tracing V1 others V3)  ECG 2013-2019 reviewed none with PVCs    BP   Lying  sitting standing 0   41mn   134/69 134/63   143/88      141/67     51`    60    83  64  Past Medical History:  Diagnosis Date  . Anxiety   . Aortic regurgitation    Mild to moderate  . Cancer (HCC)    Skin  . CKD (chronic kidney disease) stage 3, GFR 30-59 ml/min   . Coronary atherosclerosis of native coronary artery    BMS to RCA and LAD 1995  . Essential hypertension   . IBS (irritable bowel syndrome)   . Lactose intolerance   . Mitral regurgitation    Moderate  . Mixed hyperlipidemia   . Permanent atrial fibrillation ([facility]       Surgical History:  Past Surgical History:  Procedure  Laterality Date  . BALLOON DILATION  08/03/2012   Procedure: BALLOON DILATION;  Surgeon: NRogene Houston MD;  [facility];  Service: Endoscopy;  Laterality: N/A;  . COLONOSCOPY WITH ESOPHAGOGASTRODUODENOSCOPY (EGD)  08/03/2012   Procedure: COLONOSCOPY WITH ESOPHAGOGASTRODUODENOSCOPY (EGD);  Surgeon: NRogene Houston MD;  [facility];  Service: Endoscopy;  Laterality: N/A;  215  . LEFT HEART CATH AND CORONARY ANGIOGRAPHY N/A 11/18/2016   Procedure: Left Heart Cath and Coronary Angiography;  Surgeon: Peter M JMartinique MD;  [facility];  Service: Cardiovascular;  Laterality: N/A;  . LEFT INGUINAL HERNIA    . MALONEY DILATION  08/03/2012   Procedure: MALONEY DILATION;  Surgeon: NRogene Houston MD;  [facility];  Service: Endoscopy;  Laterality: N/A;  . PENILE PROSTHESIS IMPLANT    . SAVORY DILATION  08/03/2012   Procedure: SAVORY DILATION;  Surgeon: NRogene Houston MD;  [facility];  Service: Endoscopy;  Laterality: N/A;     Home Meds: Current Meds  Medication Sig  . acetaminophen (TYLENOL ARTHRITIS PAIN) 650 MG  CR tablet Take 1,300 mg by mouth 2 (two) times daily.   Marland Kitchen amLODipine (NORVASC) 2.5 MG tablet TAKE ONE TABLET BY MOUTH DAILY (BEDTIME)  . Apoaequorin (PREVAGEN) 10 MG CAPS Take by mouth daily.  . cholecalciferol (VITAMIN D3) 25 MCG (1000 UT) tablet Take 1,000 Units by mouth daily.  Marland Kitchen ELIQUIS 2.5 MG TABS tablet TAKE ONE TABLET BY MOUTH TWICE DAILY. (MORNING ,BEDTIME)  . finasteride (PROSCAR) 5 MG tablet Take 5 mg by mouth daily.    . Lactase 9000 units CHEW Chew 1 tablet by mouth See admin instructions. Pt takes only as needed when eating any dairy products  . MELATONIN PO Take 9 mg by mouth at bedtime. Melatonin 33m  . Multiple Vitamin (MULTIVITAMIN) tablet Take 1 tablet by mouth daily.  .Marland KitchenNITROSTAT 0.4 MG SL tablet PLACE ONE TABLET UNDER THE TONGUE EVERY 5 MINUTES AS NEEDED FOR CHEST PAIN  . pantoprazole (PROTONIX) 40 MG  tablet Take 1 tablet by mouth daily.  . simvastatin (ZOCOR) 20 MG tablet Take 10 mg by mouth at bedtime.  . Tamsulosin HCl (FLOMAX) 0.4 MG CAPS Take 0.4 mg by mouth daily.    . Testosterone Cypionate 200 MG/ML KIT Inject 100-150 mg into the muscle every 14 (fourteen) days. For IM use only.   Pt uses 0.5 to 0.7 mL    Allergies: No Known Allergies  Social History   Socioeconomic History  . Marital status: Married    Spouse name: Not on file  . Number of children: 7  . Years of education: Not on file  . Highest education level: Not on file  Occupational History  . Occupation: RETIRED    Employer: OTHER    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  Tobacco Use  . Smoking status: Former Smoker    Packs/day: 2.00    Years: 25.00    Pack years: 50.00    Types: Cigarettes    Quit date: 08/15/1962    Years since quitting: 57.4  . Smokeless tobacco: Never Used  Substance and Sexual Activity  . Alcohol use: No    Alcohol/week: 0.0 standard drinks  . Drug use: No  . Sexual activity: Not on file  Other Topics Concern  . Not on file  Social History Narrative  . Not on file   Social Determinants of Health   Financial Resource Strain:   . Difficulty of Paying Living Expenses:   Food Insecurity:   . Worried About RCharity fundraiserin the Last Year:   . RArboriculturistin the Last Year:   Transportation Needs:   . LFilm/video editor(Medical):   .Marland KitchenLack of Transportation (Non-Medical):   Physical Activity:   . Days of Exercise per Week:   . Minutes of Exercise per Session:   Stress:   . Feeling of Stress :   Social Connections:   . Frequency of Communication with Friends and Family:   . Frequency of Social Gatherings with Friends and Family:   . Attends Religious Services:   . Active Member of Clubs or Organizations:   . Attends CArchivistMeetings:   .Marland KitchenMarital Status:   Intimate Partner Violence:   . Fear of Current or Ex-Partner:   . Emotionally Abused:    .Marland KitchenPhysically Abused:   . Sexually Abused:      Family History  Problem Relation Age of Onset  . Heart attack Father        in his early 642's  ROS:  Please see the history of present illness. All other systems reviewed and negative.    Physical Exam:  Blood pressure 128/68, pulse (!) 57, weight 164 lb (74.4 kg). General: Well developed, well nourished male in no acute distress. Head: Normocephalic, atraumatic, sclera non-icteric, no xanthomas, nares are without discharge.  . Skin: Warm and Dry Neuro: Alert and oriented X 2 CN III-XII intact Grossly normal sensory and motor function . Psych:  Responds to questions appropriately with a normal affect.      Labs: Cardiac Enzymes No results for input(s): CKTOTAL, CKMB, TROPONINI in the last 72 hours. CBC Lab Results  Component Value Date   WBC 6.4 11/18/2016   HGB 13.9 11/18/2016   HCT 43.5 11/18/2016   MCV 108.5 (H) 11/18/2016   PLT 154 11/18/2016   PRO  Radiology/Studies:  No results found.     Assessment and Plan:  PVCs  Afib permanent   CHF chronic diastolic  Pause 4-8 sec  ( cant tell if intermediate signal is real or not) in the setting ofabrupt bradycardia to 20 bpm  ? Cardiomyopathy  Renal insufficiency grade 3-4   Dementia   There are a series of issues    PVCs are asymptomatic and as such do not need therapy.  There may be a deleterious impact on LV function suggested by the [facility] report, and it is worth clarifying as if there is a normal EF then there is no indication for pacing these PVCs -- interestingly they are relatively new in onset, at least at this density based on their absence from all ECGs prior to 2020>> will repeat echo  The pause is striking and his daughter thought she saw others while he was in the hospital.  The bradycardia could well have contributed to the gait instability esp knowing now that orthostatic hypotension is not a major issue ( not evident today)  What is striking  and beyond my ken is that he did not have syncope  His daughter is quite insistent on  Mothers day the family would have all been together at the 1300 hr  He should have been symptomatic-- and these pauses are certainly at risk for his falling   His atenolol has been stopped appropriately  Would consider loop insertion to follow for pauses in the wake of having stopped his atenolol.  Discussed pacing, but in light of the information that he was on atenolol at the time more appropriate to follow for further pausing prior to pacing       COVID 19 screen The patient denies symptoms of COVID 19 at this time.  The importance of social distancing was discussed today.  Follow-up: to be determined    Current medicines are reviewed at length with the patient today.   The patient does not have concerns regarding his medicines.  The following changes were made today:  none  Labs/ tests ordered today include: echo to assess LV function   They are doing orthostatics  No orders of the defined types were placed in this encounter.   Future tests ( post COVID )    Patient Risk:  after full review of this patients clinical status, I feel that they are at moderate  risk at this time.  Today, I have spent 35  minutes with the patient with telehealth technology discussing the above.  Signed, Virl Axe, MD  12/26/2019 6:14 PM     [address] [place] [address] (  [phone] (office) [phone] (fax)

## 2019-12-25 NOTE — Progress Notes (Signed)
    ELECTROPHYSIOLOGY CONSULT NOTE  Patient ID: Benjamin Mason, MRN: [id number], DOB/AGE: 04/06/1931 86 y.o. Admit date: (Not on file) Date of Consult: 12/26/2019  Primary Physician: Sasser, Paul W, MD Primary Cardiologist: SM     Benjamin Mason is a 86 y.o. male who is being seen today for the evaluation of pause  at the request of S McD.    HPI Benjamin Mason is a 86 y.o. male seen by telehealth for PVCs and a pause  Long standing afib and PVCs.  Was at [facility] with his wife  Felt dizzy and difficulty with ambulation nauseated -- dizziness improved with sitting recurred with standing;  Orthostatic LH ( BP change) -- BNP >4000  Rx with diuretic without significant change and had no symptoms/imaging for CHF per family  Daughter had noted pauses on monitor ( See Below)   Admitted x 6d  Hx remote MI with LAD stenting 1995  DATE TEST EF   4/18 LHC    % CAD Nonobstructive  11/20 Echo   55-60 % BAE- mod  12/20 MYOVIEW 45-54%    Date Cr K Hgb  10/17 1.72 4.6    4/21 2.12 4.0 12  MCV>105 x 8+ years--B12 folate normal (scanned)  2013   Date PVCs  11/20 30.3%       ECG Afib with bigeminal PVCs with LBBB/Infer axis morphology--QS avR/avL with transition hard to appreciate, ( one tracing V1 others V3)  ECG 2013-2019 reviewed none with PVCs    BP   Lying  sitting standing 0   3min   134/69 134/63   143/88      141/67     51`    60    83  64  Past Medical History:  Diagnosis Date  . Anxiety   . Aortic regurgitation    Mild to moderate  . Cancer (HCC)    Skin  . CKD (chronic kidney disease) stage 3, GFR 30-59 ml/min   . Coronary atherosclerosis of native coronary artery    BMS to RCA and LAD 1995  . Essential hypertension   . IBS (irritable bowel syndrome)   . Lactose intolerance   . Mitral regurgitation    Moderate  . Mixed hyperlipidemia   . Permanent atrial fibrillation (HCC)       Surgical History:  Past Surgical History:  Procedure  Laterality Date  . BALLOON DILATION  08/03/2012   Procedure: BALLOON DILATION;  Surgeon: Najeeb U Rehman, MD;  [facility];  Service: Endoscopy;  Laterality: N/A;  . COLONOSCOPY WITH ESOPHAGOGASTRODUODENOSCOPY (EGD)  08/03/2012   Procedure: COLONOSCOPY WITH ESOPHAGOGASTRODUODENOSCOPY (EGD);  Surgeon: Najeeb U Rehman, MD;  Location: [facility];  Service: Endoscopy;  Laterality: N/A;  215  . LEFT HEART CATH AND CORONARY ANGIOGRAPHY N/A 11/18/2016   Procedure: Left Heart Cath and Coronary Angiography;  Surgeon: Peter M Jordan, MD;  [facility]: [facility];  Service: Cardiovascular;  Laterality: N/A;  . LEFT INGUINAL HERNIA    . MALONEY DILATION  08/03/2012   Procedure: MALONEY DILATION;  Surgeon: Najeeb U Rehman, MD;  Location: [facility];  Service: Endoscopy;  Laterality: N/A;  . PENILE PROSTHESIS IMPLANT    . SAVORY DILATION  08/03/2012   Procedure: SAVORY DILATION;  Surgeon: Najeeb U Rehman, MD;  [facility]: [facility];  Service: Endoscopy;  Laterality: N/A;     Home Meds: Current Meds  Medication Sig  . acetaminophen (TYLENOL ARTHRITIS PAIN) 650 MG   CR tablet Take 1,300 mg by mouth 2 (two) times daily.   . amLODipine (NORVASC) 2.5 MG tablet TAKE ONE TABLET BY MOUTH DAILY (BEDTIME)  . Apoaequorin (PREVAGEN) 10 MG CAPS Take by mouth daily.  . cholecalciferol (VITAMIN D3) 25 MCG (1000 UT) tablet Take 1,000 Units by mouth daily.  . ELIQUIS 2.5 MG TABS tablet TAKE ONE TABLET BY MOUTH TWICE DAILY. (MORNING ,BEDTIME)  . finasteride (PROSCAR) 5 MG tablet Take 5 mg by mouth daily.    . Lactase 9000 units CHEW Chew 1 tablet by mouth See admin instructions. Pt takes only as needed when eating any dairy products  . MELATONIN PO Take 9 mg by mouth at bedtime. Melatonin 3mg  . Multiple Vitamin (MULTIVITAMIN) tablet Take 1 tablet by mouth daily.  . NITROSTAT 0.4 MG SL tablet PLACE ONE TABLET UNDER THE TONGUE EVERY 5 MINUTES AS NEEDED FOR CHEST PAIN  . pantoprazole (PROTONIX) 40 MG  tablet Take 1 tablet by mouth daily.  . simvastatin (ZOCOR) 20 MG tablet Take 10 mg by mouth at bedtime.  . Tamsulosin HCl (FLOMAX) 0.4 MG CAPS Take 0.4 mg by mouth daily.    . Testosterone Cypionate 200 MG/ML KIT Inject 100-150 mg into the muscle every 14 (fourteen) days. For IM use only.   Pt uses 0.5 to 0.7 mL    Allergies: No Known Allergies  Social History   Socioeconomic History  . Marital status: Married    Spouse name: Not on file  . Number of children: 7  . Years of education: Not on file  . Highest education level: Not on file  Occupational History  . Occupation: RETIRED    Employer: OTHER    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  Tobacco Use  . Smoking status: Former Smoker    Packs/day: 2.00    Years: 25.00    Pack years: 50.00    Types: Cigarettes    Quit date: 08/15/1962    Years since quitting: 57.4  . Smokeless tobacco: Never Used  Substance and Sexual Activity  . Alcohol use: No    Alcohol/week: 0.0 standard drinks  . Drug use: No  . Sexual activity: Not on file  Other Topics Concern  . Not on file  Social History Narrative  . Not on file   Social Determinants of Health   Financial Resource Strain:   . Difficulty of Paying Living Expenses:   Food Insecurity:   . Worried About Running Out of Food in the Last Year:   . Ran Out of Food in the Last Year:   Transportation Needs:   . Lack of Transportation (Medical):   . Lack of Transportation (Non-Medical):   Physical Activity:   . Days of Exercise per Week:   . Minutes of Exercise per Session:   Stress:   . Feeling of Stress :   Social Connections:   . Frequency of Communication with Friends and Family:   . Frequency of Social Gatherings with Friends and Family:   . Attends Religious Services:   . Active Member of Clubs or Organizations:   . Attends Club or Organization Meetings:   . Marital Status:   Intimate Partner Violence:   . Fear of Current or Ex-Partner:   . Emotionally Abused:    . Physically Abused:   . Sexually Abused:      Family History  Problem Relation Age of Onset  . Heart attack Father        in his early 60's       ROS:  Please see the history of present illness. All other systems reviewed and negative.    Physical Exam:  Blood pressure 128/68, pulse (!) 57, weight 164 lb (74.4 kg). General: Well developed, well nourished male in no acute distress. Head: Normocephalic, atraumatic, sclera non-icteric, no xanthomas, nares are without discharge.  . Skin: Warm and Dry Neuro: Alert and oriented X 2 CN III-XII intact Grossly normal sensory and motor function . Psych:  Responds to questions appropriately with a normal affect.      Labs: Cardiac Enzymes No results for input(s): CKTOTAL, CKMB, TROPONINI in the last 72 hours. CBC Lab Results  Component Value Date   WBC 6.4 11/18/2016   HGB 13.9 11/18/2016   HCT 43.5 11/18/2016   MCV 108.5 (H) 11/18/2016   PLT 154 11/18/2016   PRO  Radiology/Studies:  No results found.     Assessment and Plan:  PVCs  Afib permanent   CHF chronic diastolic  Pause 4-8 sec  ( cant tell if intermediate signal is real or not) in the setting ofabrupt bradycardia to 20 bpm  ? Cardiomyopathy  Renal insufficiency grade 3-4   Dementia   There are a series of issues    PVCs are asymptomatic and as such do not need therapy.  There may be a deleterious impact on LV function suggested by the myoview report, and it is worth clarifying as if there is a normal EF then there is no indication for pacing these PVCs -- interestingly they are relatively new in onset, at least at this density based on their absence from all ECGs prior to 2020>> will repeat echo  The pause is striking and his daughter thought she saw others while he was in the hospital.  The bradycardia could well have contributed to the gait instability esp knowing now that orthostatic hypotension is not a major issue ( not evident today)  What is striking  and beyond my ken is that he did not have syncope  His daughter is quite insistent on  Mothers day the family would have all been together at the 1300 hr  He should have been symptomatic-- and these pauses are certainly at risk for his falling   His atenolol has been stopped appropriately  Would consider loop insertion to follow for pauses in the wake of having stopped his atenolol.  Discussed pacing, but in light of the information that he was on atenolol at the time more appropriate to follow for further pausing prior to pacing       COVID 19 screen The patient denies symptoms of COVID 19 at this time.  The importance of social distancing was discussed today.  Follow-up: to be determined    Current medicines are reviewed at length with the patient today.   The patient does not have concerns regarding his medicines.  The following changes were made today:  none  Labs/ tests ordered today include: echo to assess LV function   They are doing orthostatics  No orders of the defined types were placed in this encounter.   Future tests ( post COVID )    Patient Risk:  after full review of this patients clinical status, I feel that they are at moderate  risk at this time.  Today, I have spent 35  minutes with the patient with telehealth technology discussing the above.  Signed, Kimbly Eanes, MD  12/26/2019 6:14 PM     [facility] [address] [place] [address] (  [phone] (office) [phone] (fax)  

## 2019-12-26 ENCOUNTER — Telehealth: Payer: Self-pay

## 2019-12-26 ENCOUNTER — Other Ambulatory Visit: Payer: Self-pay

## 2019-12-26 ENCOUNTER — Telehealth (INDEPENDENT_AMBULATORY_CARE_PROVIDER_SITE_OTHER): Payer: Medicare Other | Admitting: Internal Medicine

## 2019-12-26 VITALS — BP 128/68 | HR 57 | Wt 164.0 lb

## 2019-12-26 DIAGNOSIS — I493 Ventricular premature depolarization: Secondary | ICD-10-CM | POA: Diagnosis not present

## 2019-12-26 DIAGNOSIS — I4821 Permanent atrial fibrillation: Secondary | ICD-10-CM | POA: Diagnosis not present

## 2019-12-26 DIAGNOSIS — I5032 Chronic diastolic (congestive) heart failure: Secondary | ICD-10-CM | POA: Diagnosis not present

## 2019-12-26 NOTE — Telephone Encounter (Signed)
  Patient Consent for Virtual Visit         Benjamin Mason has provided verbal consent on 12/26/2019 for a virtual visit (video or telephone).   CONSENT FOR VIRTUAL VISIT FOR:  Benjamin Mason  By participating in this virtual visit I agree to the following:  I hereby voluntarily request, consent and authorize [facility] and its employed or contracted physicians, physician assistants, nurse practitioners or other licensed health care professionals (the Practitioner), to provide me with telemedicine health care services (the "Services") as deemed necessary by the treating Practitioner. I acknowledge and consent to receive the Services by the Practitioner via telemedicine. I understand that the telemedicine visit will involve communicating with the Practitioner through live audiovisual communication technology and the disclosure of certain medical information by electronic transmission. I acknowledge that I have been given the opportunity to request an in-person assessment or other available alternative prior to the telemedicine visit and am voluntarily participating in the telemedicine visit.  I understand that I have the right to withhold or withdraw my consent to the use of telemedicine in the course of my care at any time, without affecting my right to future care or treatment, and that the Practitioner or I may terminate the telemedicine visit at any time. I understand that I have the right to inspect all information obtained and/or recorded in the course of the telemedicine visit and may receive copies of available information for a reasonable fee.  I understand that some of the potential risks of receiving the Services via telemedicine include:  Marland Kitchen Delay or interruption in medical evaluation due to technological equipment failure or disruption; . Information transmitted may not be sufficient (e.g. poor resolution of images) to allow for appropriate medical decision making by the  Practitioner; and/or  . In rare instances, security protocols could fail, causing a breach of personal health information.  Furthermore, I acknowledge that it is my responsibility to provide information about my medical history, conditions and care that is complete and accurate to the best of my ability. I acknowledge that Practitioner's advice, recommendations, and/or decision may be based on factors not within their control, such as incomplete or inaccurate data provided by me or distortions of diagnostic images or specimens that may result from electronic transmissions. I understand that the practice of medicine is not an exact science and that Practitioner makes no warranties or guarantees regarding treatment outcomes. I acknowledge that a copy of this consent can be made available to me via my patient portal ([facility]), or I can request a printed copy by calling the office of [facility].    I understand that my insurance will be billed for this visit.   I have read or had this consent read to me. . I understand the contents of this consent, which adequately explains the benefits and risks of the Services being provided via telemedicine.  . I have been provided ample opportunity to ask questions regarding this consent and the Services and have had my questions answered to my satisfaction. . I give my informed consent for the services to be provided through the use of telemedicine in my medical care

## 2019-12-27 NOTE — Patient Instructions (Signed)
Medication Instructions:  Your physician recommends that you continue on your current medications as directed. Please refer to the Current Medication list given to you today.  *If you need a refill on your cardiac medications before your next appointment, please call your pharmacy*   Lab Work: Pt will need CBC and BMET at 12/30/2019 OV.   If you have labs (blood work) drawn today and your tests are completely normal, you will receive your results only by: Marland Kitchen MyChart Message (if you have MyChart) OR . A paper copy in the mail If you have any lab test that is abnormal or we need to change your treatment, we will call you to review the results.   Testing/Procedures: Your physician has recommended that you have a pacemaker inserted. A pacemaker is a small device that is placed under the skin of your chest or abdomen to help control abnormal heart rhythms. This device uses electrical pulses to prompt the heart to beat at a normal rate. Pacemakers are used to treat heart rhythms that are too slow. Wire (leads) are attached to the pacemaker that goes into the chambers of you heart. This is done in the hospital and usually requires and overnight stay. Please see the instruction sheet given to you today for more information.     Follow-Up: At [facility], you and your health needs are our priority.  As part of our continuing mission to provide you with exceptional heart care, we have created designated Provider Care Teams.  These Care Teams include your primary Cardiologist (physician) and Advanced Practice Providers (APPs -  Physician Assistants and Nurse Practitioners) who all work together to provide you with the care you need, when you need it.  We recommend signing up for the patient portal called "MyChart".  Sign up information is provided on this After Visit Summary.  MyChart is used to connect with patients for Virtual Visits (Telemedicine).  Patients are able to view lab/test results,  encounter notes, upcoming appointments, etc.  Non-urgent messages can be sent to your provider as well.   To learn more about what you can do with MyChart, go to [email or web address].    Your next appointment:   12/30/2019 at 145pm  The format for your next appointment:   In person  Provider:   Dr Caryl Comes   Other Instructions Dr Caryl Comes has spoken with pt's daughter, [name]).  Pt will proceed with Pacemaker implantation.

## 2019-12-28 DIAGNOSIS — I455 Other specified heart block: Secondary | ICD-10-CM | POA: Insufficient documentation

## 2019-12-29 ENCOUNTER — Other Ambulatory Visit: Payer: Self-pay

## 2019-12-29 ENCOUNTER — Encounter (HOSPITAL_COMMUNITY): Payer: Self-pay | Admitting: Emergency Medicine

## 2019-12-29 ENCOUNTER — Observation Stay (HOSPITAL_COMMUNITY)
Admission: EM | Admit: 2019-12-29 | Discharge: 2019-12-31 | Disposition: A | Discharge status: Home / Self Care | Payer: Medicare Other | Attending: Internal Medicine | Admitting: Internal Medicine

## 2019-12-29 DIAGNOSIS — Z955 Presence of coronary angioplasty implant and graft: Secondary | ICD-10-CM | POA: Diagnosis not present

## 2019-12-29 DIAGNOSIS — Z20822 Contact with and (suspected) exposure to covid-19: Secondary | ICD-10-CM | POA: Insufficient documentation

## 2019-12-29 DIAGNOSIS — R55 Syncope and collapse: Secondary | ICD-10-CM | POA: Diagnosis not present

## 2019-12-29 DIAGNOSIS — F0390 Unspecified dementia without behavioral disturbance: Secondary | ICD-10-CM | POA: Insufficient documentation

## 2019-12-29 DIAGNOSIS — I252 Old myocardial infarction: Secondary | ICD-10-CM | POA: Diagnosis not present

## 2019-12-29 DIAGNOSIS — F419 Anxiety disorder, unspecified: Secondary | ICD-10-CM | POA: Insufficient documentation

## 2019-12-29 DIAGNOSIS — I130 Hypertensive heart and chronic kidney disease with heart failure and stage 1 through stage 4 chronic kidney disease, or unspecified chronic kidney disease: Secondary | ICD-10-CM | POA: Diagnosis not present

## 2019-12-29 DIAGNOSIS — K589 Irritable bowel syndrome without diarrhea: Secondary | ICD-10-CM | POA: Insufficient documentation

## 2019-12-29 DIAGNOSIS — Z950 Presence of cardiac pacemaker: Secondary | ICD-10-CM | POA: Diagnosis not present

## 2019-12-29 DIAGNOSIS — Z03818 Encounter for observation for suspected exposure to other biological agents ruled out: Secondary | ICD-10-CM | POA: Diagnosis not present

## 2019-12-29 DIAGNOSIS — Z79899 Other long term (current) drug therapy: Secondary | ICD-10-CM | POA: Insufficient documentation

## 2019-12-29 DIAGNOSIS — I5032 Chronic diastolic (congestive) heart failure: Secondary | ICD-10-CM | POA: Insufficient documentation

## 2019-12-29 DIAGNOSIS — N183 Chronic kidney disease, stage 3 unspecified: Secondary | ICD-10-CM | POA: Diagnosis not present

## 2019-12-29 DIAGNOSIS — Z7901 Long term (current) use of anticoagulants: Secondary | ICD-10-CM | POA: Diagnosis not present

## 2019-12-29 DIAGNOSIS — I4821 Permanent atrial fibrillation: Secondary | ICD-10-CM | POA: Insufficient documentation

## 2019-12-29 DIAGNOSIS — R001 Bradycardia, unspecified: Secondary | ICD-10-CM | POA: Diagnosis not present

## 2019-12-29 DIAGNOSIS — Z959 Presence of cardiac and vascular implant and graft, unspecified: Secondary | ICD-10-CM

## 2019-12-29 DIAGNOSIS — Z95810 Presence of automatic (implantable) cardiac defibrillator: Secondary | ICD-10-CM | POA: Diagnosis not present

## 2019-12-29 DIAGNOSIS — Z87891 Personal history of nicotine dependence: Secondary | ICD-10-CM | POA: Diagnosis not present

## 2019-12-29 DIAGNOSIS — I495 Sick sinus syndrome: Secondary | ICD-10-CM | POA: Diagnosis not present

## 2019-12-29 DIAGNOSIS — I10 Essential (primary) hypertension: Secondary | ICD-10-CM | POA: Diagnosis not present

## 2019-12-29 DIAGNOSIS — E782 Mixed hyperlipidemia: Secondary | ICD-10-CM | POA: Diagnosis not present

## 2019-12-29 LAB — COMPREHENSIVE METABOLIC PANEL
ALT: 19 U/L (ref 0–44)
AST: 29 U/L (ref 15–41)
Albumin: 3.9 g/dL (ref 3.5–5.0)
Alkaline Phosphatase: 61 U/L (ref 38–126)
Anion gap: 11 (ref 5–15)
BUN: 34 mg/dL — ABNORMAL HIGH (ref 8–23)
CO2: 25 mmol/L (ref 22–32)
Calcium: 9.8 mg/dL (ref 8.9–10.3)
Chloride: 106 mmol/L (ref 98–111)
Creatinine, Ser: 1.93 mg/dL — ABNORMAL HIGH (ref 0.61–1.24)
GFR calc Af Amer: 36 mL/min — ABNORMAL LOW (ref >60)
GFR calc non Af Amer: 31 mL/min — ABNORMAL LOW (ref >60)
Glucose, Bld: 84 mg/dL (ref 70–99)
Potassium: 4.7 mmol/L (ref 3.5–5.1)
Sodium: 142 mmol/L (ref 135–145)
Total Bilirubin: 1.1 mg/dL (ref 0.3–1.2)
Total Protein: 6.9 g/dL (ref 6.5–8.1)

## 2019-12-29 LAB — CBC
HCT: 36.5 % — ABNORMAL LOW (ref 39.0–52.0)
HCT: 38.1 % — ABNORMAL LOW (ref 39.0–52.0)
Hemoglobin: 11.7 g/dL — ABNORMAL LOW (ref 13.0–17.0)
Hemoglobin: 12.3 g/dL — ABNORMAL LOW (ref 13.0–17.0)
MCH: 35.1 pg — ABNORMAL HIGH (ref 26.0–34.0)
MCH: 35.1 pg — ABNORMAL HIGH (ref 26.0–34.0)
MCHC: 32.1 g/dL (ref 30.0–36.0)
MCHC: 32.3 g/dL (ref 30.0–36.0)
MCV: 109.6 fL — ABNORMAL HIGH (ref 80.0–100.0)
MCV: 108.9 fL — ABNORMAL HIGH (ref 80.0–100.0)
Platelets: 170 10*3/uL (ref 150–400)
Platelets: 164 10*3/uL (ref 150–400)
RBC: 3.33 MIL/uL — ABNORMAL LOW (ref 4.22–5.81)
RBC: 3.5 MIL/uL — ABNORMAL LOW (ref 4.22–5.81)
RDW: 11.9 % (ref 11.5–15.5)
RDW: 12 % (ref 11.5–15.5)
WBC: 4.3 10*3/uL (ref 4.0–10.5)
WBC: 4 10*3/uL (ref 4.0–10.5)
nRBC: 0 % (ref 0.0–0.2)
nRBC: 0 % (ref 0.0–0.2)

## 2019-12-29 LAB — CREATININE, SERUM
Creatinine, Ser: 1.85 mg/dL — ABNORMAL HIGH (ref 0.61–1.24)
GFR calc Af Amer: 37 mL/min — ABNORMAL LOW (ref >60)
GFR calc non Af Amer: 32 mL/min — ABNORMAL LOW (ref >60)

## 2019-12-29 LAB — SARS CORONAVIRUS 2 BY RT PCR (HOSPITAL ORDER, PERFORMED IN ~~LOC~~ HOSPITAL LAB): SARS Coronavirus 2: POSITIVE — AB

## 2019-12-29 MED ORDER — ADULT MULTIVITAMIN W/MINERALS CH
1.0000 | ORAL_TABLET | Freq: Every day | ORAL | Status: DC
  Administered 2019-12-30 – 2019-12-31 (×2): 1 via ORAL
  Filled 2019-12-29 (×2): qty 1

## 2019-12-29 MED ORDER — SIMVASTATIN 20 MG PO TABS
10.0000 mg | ORAL_TABLET | Freq: Every day | ORAL | Status: DC
  Administered 2019-12-30 (×2): 10 mg via ORAL
  Filled 2019-12-29 (×2): qty 1

## 2019-12-29 MED ORDER — LACTASE 9000 UNITS PO CHEW: 1.0000 | CHEWABLE_TABLET | Freq: Three times a day (TID) | ORAL | Status: DC | PRN

## 2019-12-29 MED ORDER — TAMSULOSIN HCL 0.4 MG PO CAPS
0.4000 mg | ORAL_CAPSULE | Freq: Every day | ORAL | Status: DC
  Administered 2019-12-30 (×2): 0.4 mg via ORAL
  Filled 2019-12-29 (×2): qty 1

## 2019-12-29 MED ORDER — TESTOSTERONE CYPIONATE 200 MG/ML IM SOLN: 140.0000 mg | INTRAMUSCULAR | Status: DC

## 2019-12-29 MED ORDER — AMLODIPINE BESYLATE 2.5 MG PO TABS
2.5000 mg | ORAL_TABLET | Freq: Every day | ORAL | Status: DC
  Administered 2019-12-30 (×2): 2.5 mg via ORAL
  Filled 2019-12-29 (×2): qty 1

## 2019-12-29 MED ORDER — PREDNISOLONE ACETATE 1 % OP SUSP
1.0000 [drp] | Freq: Every day | OPHTHALMIC | Status: DC
  Administered 2019-12-30: 1 [drp] via OPHTHALMIC
  Filled 2019-12-29: qty 5

## 2019-12-29 MED ORDER — BRIMONIDINE TARTRATE 0.15 % OP SOLN
1.0000 [drp] | Freq: Two times a day (BID) | OPHTHALMIC | Status: DC
  Administered 2019-12-30 – 2019-12-31 (×2): 1 [drp] via OPHTHALMIC
  Filled 2019-12-29 (×2): qty 5

## 2019-12-29 MED ORDER — VITAMIN D 25 MCG (1000 UNIT) PO TABS
1000.0000 [IU] | ORAL_TABLET | Freq: Every day | ORAL | Status: DC
  Administered 2019-12-30 – 2019-12-31 (×2): 1000 [IU] via ORAL
  Filled 2019-12-29 (×2): qty 1

## 2019-12-29 MED ORDER — ENOXAPARIN SODIUM 40 MG/0.4ML ~~LOC~~ SOLN
40.0000 mg | SUBCUTANEOUS | Status: DC
  Administered 2019-12-29: 40 mg via SUBCUTANEOUS
  Filled 2019-12-29: qty 0.4

## 2019-12-29 MED ORDER — APIXABAN 2.5 MG PO TABS
2.5000 mg | ORAL_TABLET | Freq: Two times a day (BID) | ORAL | Status: DC
  Filled 2019-12-29 (×2): qty 1

## 2019-12-29 MED ORDER — FINASTERIDE 5 MG PO TABS
5.0000 mg | ORAL_TABLET | Freq: Every day | ORAL | Status: DC
  Administered 2019-12-30 – 2019-12-31 (×2): 5 mg via ORAL
  Filled 2019-12-29 (×3): qty 1

## 2019-12-29 MED ORDER — MELATONIN 3 MG PO TABS
9.0000 mg | ORAL_TABLET | Freq: Every day | ORAL | Status: DC
  Administered 2019-12-30 (×2): 9 mg via ORAL
  Filled 2019-12-29 (×2): qty 3

## 2019-12-29 MED ORDER — APOAEQUORIN 10 MG PO CAPS: 10.0000 mg | ORAL_CAPSULE | Freq: Every day | ORAL | Status: DC

## 2019-12-29 MED ORDER — PANTOPRAZOLE SODIUM 40 MG PO TBEC
40.0000 mg | DELAYED_RELEASE_TABLET | Freq: Every day | ORAL | Status: DC
  Administered 2019-12-30 – 2019-12-31 (×2): 40 mg via ORAL
  Filled 2019-12-29 (×2): qty 1

## 2019-12-29 NOTE — ED Provider Notes (Signed)
[facility] EMERGENCY DEPARTMENT Provider Note   CSN: [id number] Arrival date & time: 12/29/19  1502     History Chief Complaint  Patient presents with  . Near Syncope    Benjamin Mason is a 86 y.o. male.  The history is provided by the patient, medical records and a relative. No language interpreter was used.  Near Syncope   Benjamin Mason is a 86 y.o. male who presents to the Emergency Department complaining of near syncope.  He presented to the hospital in [facility] April 29 for dizziness and was admitted for several days. He was discharged home with a heart monitor. On review of the monitor he did have an eight second pause he was referred to Dr. Caryl Comes with code heart care. He has been scheduled for a pacemaker placement on Tuesday of this week. He no longer is wearing the heart monitor. Today he became dizzy and slimy headed. The episode lasted the majority of the morning.  Denies fever, chest pain, sob, N/V, black/bloody stools, leg swelling.  Lives at home with wife.  Took his morning medications.  His atenolol was discontinued during his recent hospitalization.       Past Medical History:  Diagnosis Date  . Anxiety   . Aortic regurgitation    Mild to moderate  . Cancer (HCC)    Skin  . CKD (chronic kidney disease) stage 3, GFR 30-59 ml/min   . Coronary atherosclerosis of native coronary artery    BMS to RCA and LAD 1995  . Essential hypertension   . IBS (irritable bowel syndrome)   . Lactose intolerance   . Mitral regurgitation    Moderate  . Mixed hyperlipidemia   . Permanent atrial fibrillation [facility])     Patient Active Problem List   Diagnosis Date Noted  . Sick sinus syndrome ([place]) 12/29/2019  . Sinus pause 12/28/2019  . Progressive angina ([place]) 11/18/2016  . CKD (chronic kidney disease), stage III 11/18/2016  . GERD (gastroesophageal reflux disease) 12/04/2013  . Dysphagia, unspecified(787.20) 12/04/2013  . Valvular heart disease  07/31/2013  . Long term (current) use of anticoagulants 11/23/2010  . Essential hypertension, benign 12/17/2009  . Coronary atherosclerosis of native coronary artery 05/26/2009  . Mixed hyperlipidemia 05/25/2009    Past Surgical History:  Procedure Laterality Date  . BALLOON DILATION  08/03/2012   Procedure: BALLOON DILATION;  Surgeon: Rogene Houston, MD;  [facility];  Service: Endoscopy;  Laterality: N/A;  . COLONOSCOPY WITH ESOPHAGOGASTRODUODENOSCOPY (EGD)  08/03/2012   Procedure: COLONOSCOPY WITH ESOPHAGOGASTRODUODENOSCOPY (EGD);  Surgeon: Rogene Houston, MD;  [facility];  Service: Endoscopy;  Laterality: N/A;  215  . LEFT HEART CATH AND CORONARY ANGIOGRAPHY N/A 11/18/2016   Procedure: Left Heart Cath and Coronary Angiography;  Surgeon: Peter M Martinique, MD;  Location: [facility] CV LAB;  Service: Cardiovascular;  Laterality: N/A;  . LEFT INGUINAL HERNIA    . MALONEY DILATION  08/03/2012   Procedure: MALONEY DILATION;  Surgeon: Rogene Houston, MD;  [facility];  Service: Endoscopy;  Laterality: N/A;  . PENILE PROSTHESIS IMPLANT    . SAVORY DILATION  08/03/2012   Procedure: SAVORY DILATION;  Surgeon: Rogene Houston, MD;  [facility];  Service: Endoscopy;  Laterality: N/A;       Family History  Problem Relation Age of Onset  . Heart attack Father        in his early 35's  Social History   Tobacco Use  . Smoking status: Former Smoker    Packs/day: 2.00    Years: 25.00    Pack years: 50.00    Types: Cigarettes    Quit date: 08/15/1962    Years since quitting: 57.4  . Smokeless tobacco: Never Used  Substance Use Topics  . Alcohol use: No    Alcohol/week: 0.0 standard drinks  . Drug use: No    Home Medications Prior to Admission medications   Medication Sig Start Date End Date Taking? Authorizing Provider  acetaminophen (TYLENOL ARTHRITIS PAIN) 650 MG CR tablet Take 1,300 mg by mouth 2 (two) times daily.    Yes  [provider]  amLODipine (NORVASC) 2.5 MG tablet TAKE ONE TABLET BY MOUTH DAILY (BEDTIME) Patient taking differently: Take 2.5 mg by mouth at bedtime.  09/02/19  Yes Satira Sark, MD  Apoaequorin (PREVAGEN) 10 MG CAPS Take 10 mg by mouth daily.    Yes [provider]  brimonidine (ALPHAGAN) 0.15 % ophthalmic solution Place 1 drop into the right eye 2 (two) times daily. 12/17/19  Yes [provider]  cholecalciferol (VITAMIN D3) 25 MCG (1000 UT) tablet Take 1,000 Units by mouth daily.   Yes [provider]  ELIQUIS 2.5 MG TABS tablet TAKE ONE TABLET BY MOUTH TWICE DAILY. (MORNING ,BEDTIME) Patient taking differently: Take 2.5 mg by mouth 2 (two) times daily.  07/25/19  Yes Satira Sark, MD  finasteride (PROSCAR) 5 MG tablet Take 5 mg by mouth daily.     Yes [provider]  Lactase 9000 units CHEW Chew 1 tablet by mouth 3 (three) times daily as needed (when eating dairy products).    Yes [provider]  melatonin 3 MG TABS tablet Take 9 mg by mouth at bedtime.   Yes [provider]  Multiple Vitamin (MULTIVITAMIN WITH MINERALS) TABS tablet Take 1 tablet by mouth daily.   Yes [provider]  NITROSTAT 0.4 MG SL tablet PLACE ONE TABLET UNDER THE TONGUE EVERY 5 MINUTES AS NEEDED FOR CHEST PAIN Patient taking differently: Place 0.4 mg under the tongue every 5 (five) minutes as needed for chest pain.  12/20/16  Yes Satira Sark, MD  pantoprazole (PROTONIX) 40 MG tablet Take 40 mg by mouth daily.  01/17/19  Yes [provider]  prednisoLONE acetate (PRED FORTE) 1 % ophthalmic suspension Place 1 drop into the right eye at bedtime. 10/15/19  Yes [provider]  simvastatin (ZOCOR) 20 MG tablet Take 10 mg by mouth at bedtime. 01/17/19  Yes [provider]  Tamsulosin HCl (FLOMAX) 0.4 MG CAPS Take 0.4 mg by mouth at bedtime.    Yes [provider]  testosterone cypionate (DEPOTESTOSTERONE  CYPIONATE) 200 MG/ML injection Inject 140 mg into the muscle every 14 (fourteen) days. 09/16/19  Yes [provider]  triamcinolone cream (KENALOG) 0.1 % Apply 1 application topically daily as needed (wound care).  09/09/19  Yes [provider]    Allergies    Patient has no known allergies.  Review of Systems   Review of Systems  Cardiovascular: Positive for near-syncope.  All other systems reviewed and are negative.   Physical Exam Updated Vital Signs BP (!) 151/67   Pulse (!) 36   Temp 98 F (36.7 C) (Oral)   Resp 11   SpO2 99%   Physical Exam Vitals and nursing note reviewed.  Constitutional:      Appearance: He is well-developed.  HENT:  Head: Normocephalic and atraumatic.  Cardiovascular:     Rate and Rhythm: Bradycardia present. Rhythm irregular.     Heart sounds: No murmur.  Pulmonary:     Effort: Pulmonary effort is normal. No respiratory distress.     Breath sounds: Normal breath sounds.  Abdominal:     Palpations: Abdomen is soft.     Tenderness: There is no abdominal tenderness. There is no guarding or rebound.  Musculoskeletal:        General: No swelling or tenderness.  Skin:    General: Skin is warm and dry.  Neurological:     Mental Status: He is alert and oriented to person, place, and time.  Psychiatric:        Behavior: Behavior normal.     ED Results / Procedures / Treatments   Labs (all labs ordered are listed, but only abnormal results are displayed) Labs Reviewed  SARS CORONAVIRUS 2 BY RT PCR (HOSPITAL ORDER, [facility]) - Abnormal; Notable for the following components:      Result Value   SARS Coronavirus 2 PRESUMPTIVE POSITIVE (*)    All other components within normal limits  CBC - Abnormal; Notable for the following components:   RBC 3.33 (*)    Hemoglobin 11.7 (*)    HCT 36.5 (*)    MCV 109.6 (*)    MCH 35.1 (*)    All other components within normal limits  COMPREHENSIVE METABOLIC  PANEL - Abnormal; Notable for the following components:   BUN 34 (*)    Creatinine, Ser 1.93 (*)    GFR calc non Af Amer 31 (*)    GFR calc Af Amer 36 (*)    All other components within normal limits  CBC - Abnormal; Notable for the following components:   RBC 3.50 (*)    Hemoglobin 12.3 (*)    HCT 38.1 (*)    MCV 108.9 (*)    MCH 35.1 (*)    All other components within normal limits  CREATININE, SERUM - Abnormal; Notable for the following components:   Creatinine, Ser 1.85 (*)    GFR calc non Af Amer 32 (*)    GFR calc Af Amer 37 (*)    All other components within normal limits  SARS CORONAVIRUS 2 (TAT 6-24 HRS)  BASIC METABOLIC PANEL  CBC  PROTIME-INR    EKG EKG Interpretation  Date/Time:  Sunday Dec 29 2019 15:17:56 EDT Ventricular Rate:  79 PR Interval:    QRS Duration: 90 QT Interval:  372 QTC Calculation: 426 R Axis:   -55 Text Interpretation: atrial fibrillation with multiple PVCs Abnormal ECG Confirmed by Quintella Reichert [phone]) on 12/29/2019 5:34:57 PM   Radiology No results found.  Procedures Procedures (including critical care time)  Medications Ordered in ED Medications  amLODipine (NORVASC) tablet 2.5 mg (has no administration in time range)  brimonidine (ALPHAGAN) 0.15 % ophthalmic solution 1 drop (has no administration in time range)  cholecalciferol (VITAMIN D3) tablet 1,000 Units (has no administration in time range)  apixaban (ELIQUIS) tablet 2.5 mg (has no administration in time range)  finasteride (PROSCAR) tablet 5 mg (has no administration in time range)  melatonin tablet 9 mg (has no administration in time range)  multivitamin with minerals tablet 1 tablet (has no administration in time range)  pantoprazole (PROTONIX) EC tablet 40 mg (has no administration in time range)  prednisoLONE acetate (PRED FORTE) 1 % ophthalmic suspension 1 drop (has no administration in time range)  simvastatin (ZOCOR) tablet 10 mg (has no administration in time  range)  tamsulosin (FLOMAX) capsule 0.4 mg (has no administration in time range)    ED Course  I have reviewed the triage vital signs and the nursing notes.  Pertinent labs & imaging results that were available during my care of the patient were reviewed by me and considered in my medical decision making (see chart for details).    MDM Rules/Calculators/A&P                     Patient here for evaluation of dizziness, outpatient heart monitor with significant sinus pauses. There is concern that his dizziness today was related to arrhythmia and prolonged pauses. Cardiology consulted for admission for telemetry and possible pacemaker placement. Patient updated of findings of studies and is in agreement with treatment plan.  Final Clinical Impression(s) / ED Diagnoses Final diagnoses:  None    Rx / DC Orders ED Discharge Orders    None       Quintella Reichert, MD 12/30/19 0021

## 2019-12-29 NOTE — ED Triage Notes (Signed)
Pt reports that he was wearing a cardiac monitor until Thursday of this week and was noted to have an 8 second pause, states he woke up this morning and felt slightly dizzy, denies LOC/chest pain/shortness of breath.

## 2019-12-29 NOTE — ED Notes (Signed)
Hospital bed ordered for pt.

## 2019-12-29 NOTE — H&P (Signed)
Cardiology Admission History and Physical:   Patient ID: Benjamin Mason MRN: [id number]; DOB: [birth year]   Admission date: 12/29/2019  Primary Care Provider: Manon Hilding, MD Primary Cardiologist: Rozann Lesches, MD  Primary Electrophysiologist:  Caryl Comes  Chief Complaint:  Symptomatic bradycardia/pauses on monitor  Patient Profile:   Benjamin Mason is a 86 y.o. male with w/ h/o CAD and PCI, CKD stage 3, mild-mod AI, HTN, mod MR, permanent afib, dyslipidemia who was scheduled for elective PPM placement on 5/18 but came in tonight after feeling dizzy. He talked w/ EP on call who advised him to come in to ED for evaluation and possible admission.  History of Present Illness:   Benjamin Mason recently wore an ambulatory monitor ordered by EP that showed a 4-8 second pause, bradycardia w/ HR in the 20s, and frequent PVCs. He was symptomatic from the bradycardia, thus PPM was planned electively for later this week.  Today however he has had issues w/ feeling dizziness. He has not had any chest discomfort, SOB, palpitations, nausea, diaphoresis or any other associated sx. Given this recent diagnosis of symptomatic bradycardia and pause, and planned PPM, pt contacted the EP on call who was Dr. Rayann Heman. He was advised to come to the ED for evaluation. He has not had any bradycardia or pause on the monitor since being in the ED. He is currently asymptomatic.  Past Medical History:  Diagnosis Date  . Anxiety   . Aortic regurgitation    Mild to moderate  . Cancer (HCC)    Skin  . CKD (chronic kidney disease) stage 3, GFR 30-59 ml/min   . Coronary atherosclerosis of native coronary artery    BMS to RCA and LAD 1995  . Essential hypertension   . IBS (irritable bowel syndrome)   . Lactose intolerance   . Mitral regurgitation    Moderate  . Mixed hyperlipidemia   . Permanent atrial fibrillation [facility])     Past Surgical History:  Procedure Laterality Date  . BALLOON DILATION   08/03/2012   Procedure: BALLOON DILATION;  Surgeon: Rogene Houston, MD;  [facility];  Service: Endoscopy;  Laterality: N/A;  . COLONOSCOPY WITH ESOPHAGOGASTRODUODENOSCOPY (EGD)  08/03/2012   Procedure: COLONOSCOPY WITH ESOPHAGOGASTRODUODENOSCOPY (EGD);  Surgeon: Rogene Houston, MD;  [facility];  Service: Endoscopy;  Laterality: N/A;  215  . LEFT HEART CATH AND CORONARY ANGIOGRAPHY N/A 11/18/2016   Procedure: Left Heart Cath and Coronary Angiography;  Surgeon: Peter M Martinique, MD;  Location: [facility] LAB;  Service: Cardiovascular;  Laterality: N/A;  . LEFT INGUINAL HERNIA    . MALONEY DILATION  08/03/2012   Procedure: MALONEY DILATION;  Surgeon: Rogene Houston, MD;  [facility];  Service: Endoscopy;  Laterality: N/A;  . PENILE PROSTHESIS IMPLANT    . SAVORY DILATION  08/03/2012   Procedure: SAVORY DILATION;  Surgeon: Rogene Houston, MD;  [facility];  Service: Endoscopy;  Laterality: N/A;     Medications Prior to Admission: Prior to Admission medications   Medication Sig Start Date End Date Taking? Authorizing Provider  acetaminophen (TYLENOL ARTHRITIS PAIN) 650 MG CR tablet Take 1,300 mg by mouth 2 (two) times daily.     [provider]  amLODipine (NORVASC) 2.5 MG tablet TAKE ONE TABLET BY MOUTH DAILY (BEDTIME) 09/02/19   Satira Sark, MD  Apoaequorin (PREVAGEN) 10 MG CAPS Take by mouth daily.    [provider]  cholecalciferol (VITAMIN D3)  25 MCG (1000 UT) tablet Take 1,000 Units by mouth daily.    [provider]  ELIQUIS 2.5 MG TABS tablet TAKE ONE TABLET BY MOUTH TWICE DAILY. (MORNING ,BEDTIME) 07/25/19   Satira Sark, MD  finasteride (PROSCAR) 5 MG tablet Take 5 mg by mouth daily.      [provider]  Lactase 9000 units CHEW Chew 1 tablet by mouth See admin instructions. Pt takes only as needed when eating any dairy products    [provider]  MELATONIN PO Take 9 mg by  mouth at bedtime. Melatonin 60m    [provider]  Multiple Vitamin (MULTIVITAMIN) tablet Take 1 tablet by mouth daily.    [provider]  NITROSTAT 0.4 MG SL tablet PLACE ONE TABLET UNDER THE TONGUE EVERY 5 MINUTES AS NEEDED FOR CHEST PAIN 12/20/16   MSatira Sark MD  pantoprazole (PROTONIX) 40 MG tablet Take 1 tablet by mouth daily. 01/17/19   [provider]  simvastatin (ZOCOR) 20 MG tablet Take 10 mg by mouth at bedtime. 01/17/19   [provider]  Tamsulosin HCl (FLOMAX) 0.4 MG CAPS Take 0.4 mg by mouth daily.      [provider]  Testosterone Cypionate 200 MG/ML KIT Inject 100-150 mg into the muscle every 14 (fourteen) days. For IM use only.   Pt uses 0.5 to 0.7 mL    [provider]     Allergies:   No Known Allergies  Social History:   Social History   Socioeconomic History  . Marital status: Married    Spouse name: Not on file  . Number of children: 7  . Years of education: Not on file  . Highest education level: Not on file  Occupational History  . Occupation: RETIRED    Employer: OTHER    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 [employer]  Tobacco Use  . Smoking status: Former Smoker    Packs/day: 2.00    Years: 25.00    Pack years: 50.00    Types: Cigarettes    Quit date: 08/15/1962    Years since quitting: 57.4  . Smokeless tobacco: Never Used  Substance and Sexual Activity  . Alcohol use: No    Alcohol/week: 0.0 standard drinks  . Drug use: No  . Sexual activity: Not on file  Other Topics Concern  . Not on file  Social History Narrative  . Not on file   Social Determinants of Health   Financial Resource Strain:   . Difficulty of Paying Living Expenses:   Food Insecurity:   . Worried About RCharity fundraiserin the Last Year:   . RArboriculturistin the Last Year:   Transportation Needs:   . LFilm/video editor(Medical):   .Marland KitchenLack of Transportation (Non-Medical):   Physical Activity:   . Days of  Exercise per Week:   . Minutes of Exercise per Session:   Stress:   . Feeling of Stress :   Social Connections:   . Frequency of Communication with Friends and Family:   . Frequency of Social Gatherings with Friends and Family:   . Attends Religious Services:   . Active Member of Clubs or Organizations:   . Attends CArchivistMeetings:   .Marland KitchenMarital Status:   Intimate Partner Violence:   . Fear of Current or Ex-Partner:   . Emotionally Abused:   .Marland KitchenPhysically Abused:   . Sexually Abused:     Family History:  The patient's family history includes Heart attack in his father.    ROS:  Please see the history of present illness.  All other ROS reviewed and negative.     Physical Exam/Data:   Vitals:   12/29/19 1930 12/29/19 2015 12/29/19 2030 12/29/19 2045  BP: (!) 153/61 (!) 161/71 (!) 170/86 137/67  Pulse: 74 76 (!) 59 60  Resp: 20 (!) 22 17 (!) 25  Temp:      TempSrc:      SpO2: 98% 99% 92% 98%   No intake or output data in the 24 hours ending 12/29/19 2103 Last 3 Weights 12/26/2019 12/16/2019 07/23/2019  Weight (lbs) 164 lb 169 lb 182 lb 6.4 oz  Weight (kg) 74.39 kg 76.658 kg 82.736 kg     There is no height or weight on file to calculate BMI.  General:  Well nourished, well developed, in no acute distress HEENT: normal Lymph: no adenopathy Neck: no JVD Endocrine:  No thryomegaly Vascular: No carotid bruits; DP pulses 2+ bilaterally  Cardiac:  normal S1, S2; irreg irreg; no murmur  Lungs:  clear to auscultation bilaterally, no wheezing, rhonchi or rales  Abd: soft, nontender, no hepatomegaly  Ext: no edema Musculoskeletal:  No deformities Skin: warm and dry  Neuro:  no focal abnormalities noted Psych:  Normal affect    EKG:  The ECG that was done 12-29-19 was personally reviewed and demonstrates afib w/ multifocal PVC, HR 79  Relevant CV Studies: TTE 07-09-19 1. Left ventricular ejection fraction, by visual estimation, is 55 to  60%. The left ventricle  has normal function. There is no left ventricular  hypertrophy.  2. Left ventricular diastolic parameters are indeterminate.  3. Global right ventricle has normal systolic function.The right  ventricular size is mildly enlarged. No increase in right ventricular wall  thickness.  4. Left atrial size was moderately dilated.  5. Right atrial size was moderately dilated.  6. Mild mitral annular calcification.  7. Mild to moderate aortic valve annular calcification.  8. The mitral valve is degenerative. Mild mitral valve regurgitation.  9. The tricuspid valve is grossly normal. Tricuspid valve regurgitation  is mild.  10. The aortic valve is tricuspid. Aortic valve regurgitation is mild.  Mild aortic valve sclerosis without stenosis.  11. The pulmonic valve was grossly normal. Pulmonic valve regurgitation is  mild.  12. Normal pulmonary artery systolic pressure.  13. The tricuspid regurgitant velocity is 2.23 m/s, and with an assumed  right atrial pressure of 8 mmHg, the estimated right ventricular systolic  pressure is normal at 27.9 mmHg.  14. The inferior vena cava is normal in size with <50% respiratory  variability, suggesting right atrial pressure of 8 mmHg.   Laboratory Data:  High Sensitivity Troponin:  No results for input(s): TROPONINIHS in the last 720 hours.    Chemistry Recent Labs  Lab 12/29/19 1542 12/29/19 2021  NA 142  --   K 4.7  --   CL 106  --   CO2 25  --   GLUCOSE 84  --   BUN 34*  --   CREATININE 1.93* 1.85*  CALCIUM 9.8  --   GFRNONAA 31* 32*  GFRAA 36* 37*  ANIONGAP 11  --     Recent Labs  Lab 12/29/19 1542  PROT 6.9  ALBUMIN 3.9  AST 29  ALT 19  ALKPHOS 61  BILITOT 1.1   Hematology Recent Labs  Lab 12/29/19 1542 12/29/19 2021  WBC 4.3 4.0  RBC 3.33*  3.50*  HGB 11.7* 12.3*  HCT 36.5* 38.1*  MCV 109.6* 108.9*  MCH 35.1* 35.1*  MCHC 32.1 32.3  RDW 11.9 12.0  PLT 170 164   BNPNo results for input(s): BNP, PROBNP in the last  168 hours.  DDimer No results for input(s): DDIMER in the last 168 hours.   Radiology/Studies:  No results found.       Assessment and Plan:   1. Permanent AF/SSS/bradycardia/pause: PPM was previously planned for 12-31-19, but pt has been having worsening dizziness and symptoms possibly tied to his bradycardia. Will bring in for observation and monitoring. Will have EP see pt in AM; will leave him NPO after MN tonight in case they may be able to add him on tomorrow for PPM. TTE had been ordered OP; will go ahead and get that done as well. 2. AF: permanent. Rate-controlled.  3. CKD: Cr ~at baseline currently.   For questions or updates, please contact [place] Please consult [email or web address] for contact info under     Signed, Rudean Curt, MD, [facility]  12/29/2019 9:03 PM   ADDENDUM: preliminary COVID test read as "presumptive positive"; this may have been due to lack of proper control for comparison per the lab. Pt had COVID in December, and has received both doses of the Pfizer vaccine subsequent to his illness, making true positive unlikely. A confirmatory nasal swab send-out has been sent and is pending and should result no later than ~10am tomorrow morning. Pt will be held in the ED until that test is resulted. He will remain on the cardiology service for now unless he is confirmed COVID positive.

## 2019-12-30 ENCOUNTER — Ambulatory Visit: Payer: Medicare Other | Admitting: Internal Medicine

## 2019-12-30 ENCOUNTER — Other Ambulatory Visit (HOSPITAL_COMMUNITY): Payer: Medicare Other

## 2019-12-30 ENCOUNTER — Encounter (HOSPITAL_COMMUNITY): Payer: Self-pay | Admitting: Cardiology

## 2019-12-30 ENCOUNTER — Ambulatory Visit (HOSPITAL_COMMUNITY)
Admission: EM | Disposition: A | Discharge status: Home / Self Care | Payer: Self-pay | Source: Home / Self Care | Attending: Emergency Medicine

## 2019-12-30 DIAGNOSIS — I495 Sick sinus syndrome: Secondary | ICD-10-CM

## 2019-12-30 DIAGNOSIS — R55 Syncope and collapse: Secondary | ICD-10-CM | POA: Diagnosis not present

## 2019-12-30 HISTORY — PX: PACEMAKER IMPLANT: EP1218

## 2019-12-30 LAB — BASIC METABOLIC PANEL
Anion gap: 9 (ref 5–15)
BUN: 31 mg/dL — ABNORMAL HIGH (ref 8–23)
CO2: 21 mmol/L — ABNORMAL LOW (ref 22–32)
Calcium: 9.4 mg/dL (ref 8.9–10.3)
Chloride: 110 mmol/L (ref 98–111)
Creatinine, Ser: 1.8 mg/dL — ABNORMAL HIGH (ref 0.61–1.24)
GFR calc Af Amer: 39 mL/min — ABNORMAL LOW (ref >60)
GFR calc non Af Amer: 33 mL/min — ABNORMAL LOW (ref >60)
Glucose, Bld: 89 mg/dL (ref 70–99)
Potassium: 4.9 mmol/L (ref 3.5–5.1)
Sodium: 140 mmol/L (ref 135–145)

## 2019-12-30 LAB — CBC
HCT: 36.1 % — ABNORMAL LOW (ref 39.0–52.0)
Hemoglobin: 11.7 g/dL — ABNORMAL LOW (ref 13.0–17.0)
MCH: 35.2 pg — ABNORMAL HIGH (ref 26.0–34.0)
MCHC: 32.4 g/dL (ref 30.0–36.0)
MCV: 108.7 fL — ABNORMAL HIGH (ref 80.0–100.0)
Platelets: 165 10*3/uL (ref 150–400)
RBC: 3.32 MIL/uL — ABNORMAL LOW (ref 4.22–5.81)
RDW: 11.9 % (ref 11.5–15.5)
WBC: 5 10*3/uL (ref 4.0–10.5)
nRBC: 0 % (ref 0.0–0.2)

## 2019-12-30 LAB — SARS CORONAVIRUS 2 (TAT 6-24 HRS): SARS Coronavirus 2: NEGATIVE

## 2019-12-30 LAB — PROTIME-INR
INR: 1.1 (ref 0.8–1.2)
Prothrombin Time: 14.2 seconds (ref 11.4–15.2)

## 2019-12-30 SURGERY — PACEMAKER IMPLANT

## 2019-12-30 MED ORDER — HEPARIN (PORCINE) IN NACL 1000-0.9 UT/500ML-% IV SOLN
INTRAVENOUS | Status: DC | PRN
  Administered 2019-12-30: 500 mL

## 2019-12-30 MED ORDER — SODIUM CHLORIDE 0.9 % IV SOLN
80.0000 mg | INTRAVENOUS | Status: AC
  Administered 2019-12-30: 80 mg
  Filled 2019-12-30: qty 2

## 2019-12-30 MED ORDER — ATENOLOL 50 MG PO TABS
50.0000 mg | ORAL_TABLET | Freq: Every day | ORAL | Status: DC
  Administered 2019-12-31: 50 mg via ORAL
  Filled 2019-12-30 (×3): qty 1

## 2019-12-30 MED ORDER — CEFAZOLIN SODIUM-DEXTROSE 2-4 GM/100ML-% IV SOLN
2.0000 g | INTRAVENOUS | Status: AC
  Administered 2019-12-30: 2 g via INTRAVENOUS
  Filled 2019-12-30: qty 100

## 2019-12-30 MED ORDER — HEPARIN (PORCINE) IN NACL 1000-0.9 UT/500ML-% IV SOLN
INTRAVENOUS | Status: AC
  Filled 2019-12-30: qty 500

## 2019-12-30 MED ORDER — LIDOCAINE HCL (PF) 1 % IJ SOLN
INTRAMUSCULAR | Status: DC | PRN
  Administered 2019-12-30: 45 mL

## 2019-12-30 MED ORDER — CEFAZOLIN SODIUM-DEXTROSE 2-4 GM/100ML-% IV SOLN
INTRAVENOUS | Status: AC
  Filled 2019-12-30: qty 100

## 2019-12-30 MED ORDER — SODIUM CHLORIDE 0.9 % IV SOLN
INTRAVENOUS | Status: AC
  Filled 2019-12-30: qty 2

## 2019-12-30 MED ORDER — SODIUM CHLORIDE 0.9 % IV SOLN: INTRAVENOUS | Status: DC

## 2019-12-30 MED ORDER — SODIUM CHLORIDE 0.9 % IV SOLN
INTRAVENOUS | Status: DC | PRN
  Administered 2019-12-30 – 2019-12-31 (×2): 250 mL via INTRAVENOUS

## 2019-12-30 MED ORDER — LIDOCAINE HCL 1 % IJ SOLN
INTRAMUSCULAR | Status: AC
  Filled 2019-12-30: qty 60

## 2019-12-30 MED ORDER — ACETAMINOPHEN 325 MG PO TABS
325.0000 mg | ORAL_TABLET | ORAL | Status: DC | PRN
  Administered 2019-12-30: 650 mg via ORAL
  Filled 2019-12-30: qty 2

## 2019-12-30 MED ORDER — ONDANSETRON HCL 4 MG/2ML IJ SOLN: 4.0000 mg | Freq: Four times a day (QID) | INTRAMUSCULAR | Status: DC | PRN

## 2019-12-30 MED ORDER — CEFAZOLIN SODIUM-DEXTROSE 1-4 GM/50ML-% IV SOLN
1.0000 g | Freq: Four times a day (QID) | INTRAVENOUS | Status: AC
  Administered 2019-12-30 – 2019-12-31 (×3): 1 g via INTRAVENOUS
  Filled 2019-12-30 (×3): qty 50

## 2019-12-30 SURGICAL SUPPLY — 8 items
CABLE SURGICAL S-101-97-12 (CABLE) ×3 IMPLANT
HEMOSTAT SURGICEL 2X4 FIBR (HEMOSTASIS) ×2 IMPLANT
KIT MICROPUNCTURE NIT STIFF (SHEATH) ×2 IMPLANT
LEAD TENDRIL MRI 58CM LPA1200M (Lead) ×2 IMPLANT
PACEMAKER ASSURITY SR-SF (Pacemaker) ×2 IMPLANT
PAD PRO RADIOLUCENT 2001M-C (PAD) ×3 IMPLANT
SHEATH 8FR PRELUDE SNAP 13 (SHEATH) ×2 IMPLANT
TRAY PACEMAKER INSERTION (PACKS) ×3 IMPLANT

## 2019-12-30 NOTE — Consult Note (Addendum)
Cardiology Consultation:   Patient ID: Benjamin Mason MRN: [id number]; DOB: 21-Oct-1930  Admit date: 12/29/2019 Date of Consult: 12/30/2019  Primary Care Provider: Manon Hilding, MD Primary Cardiologist: Rozann Lesches, MD  Primary Electrophysiologist:  Dr. Caryl Comes   Patient Profile:   Benjamin Mason is a 86 y.o. male with a hx of CKD (III), mod nonobstructive CAD (by cath 2018, remote PCI to RCA, LAD 1995), HLD, PVCs, AFIb (permanent) who is being seen today for the evaluation of symptomatic bradycardia at the request of Dr. Hassell Done.  History of Present Illness:   Benjamin Mason 07/23/2019.  He was having frequent PVCs approximately 30%  by interval cardiac monitor.  He was asymptomatic in terms of any sense of palpitations and had no chest pains or syncope.  Follow-up echo showed normal EF 55-60.  Myoview did not reveal any large ischemic territories  He had a recent hospital stay at [facility] 2/2 unsteady gait, apparently found volume OL, ?RRVR with his AFib, reportedly via family account flat or negative troponins.  He was diuresed and discharged with Zio monitor to evaluate rate control, ? Tachy-brady.  He was referred to Dr. Caryl Comes 12/26/19 with a finding of long pause on the monitor.  Dr. Caryl Comes elicited h/o Felt dizzy and difficulty with ambulation nauseated -- dizziness improved with sitting recurred with standing;  Orthostatic LH ( BP change) -- BNP >4000  Rx with diuretic without significant change and had no symptoms/imaging for CHF per family ECG Afib with bigeminal PVCs with LBBB/Infer axis morphology--QS avR/avL with transition hard to appreciate, ( one tracing V1 others V3)  ECG 2013-2019 reviewed none with PVCs  Pause 4-8 sec  ( cant tell if intermediate signal is real or not) in the setting ofabrupt bradycardia to 20 bpm (asymptomatic) His atenolol had been stopped (12/23/19), there were thoughts towards loop implant given he had been on BB with the pause.   He was  admitted last night with c/o dizziness, lightheadedness, given recent monitor findings and apparently had been ultimately planned for pacing recommended to come in for admission and pacing.  LABS K+ 4.9 BUN/Creat 31/1.80 (baseline Creat looks 1.9) WBC 5.0 H/H  11.7/36.1 Plts 165  Pt reports COVID infection Dec with subsequent vaccination with single J&J vaccine His 1st COVID test here was reported as "presumptive positive" verbally told to the nurse to be inconclusive A second collected and resulted negative  I called the lab, unclear, noted they were run on different machines.  Also unclear why the "presumptive" rather then just positive.   He is a very pleasant, knowledgeable man, retired Software engineer, he tells me that when he was admitted to [facility], he was feeling dizzy and having trouble walking, his legs were getting crossed in front of him.  He says they did CT and MRI brain without findings of stroke.  He also mentions found nothing wrong with his heart. He reports that yesterday when getting ready for church, he just didn't feel right, a little dizzy maybe, just something off.  Decided to stay home.  This seemed to be persistent at home, and In d/w RN from our office apparently eventually recommended to go to the hospital.    He denies symptoms of illness, or fever He had no near syncope, he has not fainted.   He has a hard time describing what he feels, but "just off" No overt palpitations, no CP, no SOB     Past Medical History:  Diagnosis Date  .  Anxiety   . Aortic regurgitation    Mild to moderate  . Cancer (HCC)    Skin  . CKD (chronic kidney disease) stage 3, GFR 30-59 ml/min   . Coronary atherosclerosis of native coronary artery    BMS to RCA and LAD 1995  . Essential hypertension   . IBS (irritable bowel syndrome)   . Lactose intolerance   . Mitral regurgitation    Moderate  . Mixed hyperlipidemia   . Permanent atrial fibrillation [facility])     Past  Surgical History:  Procedure Laterality Date  . BALLOON DILATION  08/03/2012   Procedure: BALLOON DILATION;  Surgeon: Rogene Houston, MD;  [facility];  Service: Endoscopy;  Laterality: N/A;  . COLONOSCOPY WITH ESOPHAGOGASTRODUODENOSCOPY (EGD)  08/03/2012   Procedure: COLONOSCOPY WITH ESOPHAGOGASTRODUODENOSCOPY (EGD);  Surgeon: Rogene Houston, MD;  Location[facility];  Service: Endoscopy;  Laterality: N/A;  215  . LEFT HEART CATH AND CORONARY ANGIOGRAPHY N/A 11/18/2016   Procedure: Left Heart Cath and Coronary Angiography;  Surgeon: Peter M Martinique, MD;  Location: [place] CV LAB;  Service: Cardiovascular;  Laterality: N/A;  . LEFT INGUINAL HERNIA    . MALONEY DILATION  08/03/2012   Procedure: MALONEY DILATION;  Surgeon: Rogene Houston, MD;  [facility];  Service: Endoscopy;  Laterality: N/A;  . PENILE PROSTHESIS IMPLANT    . SAVORY DILATION  08/03/2012   Procedure: SAVORY DILATION;  Surgeon: Rogene Houston, MD;  [facility];  Service: Endoscopy;  Laterality: N/A;     Home Medications:  Prior to Admission medications   Medication Sig Start Date End Date Taking? Authorizing Provider  acetaminophen (TYLENOL ARTHRITIS PAIN) 650 MG CR tablet Take 1,300 mg by mouth 2 (two) times daily.    Yes [provider]  amLODipine (NORVASC) 2.5 MG tablet TAKE ONE TABLET BY MOUTH DAILY (BEDTIME) Patient taking differently: Take 2.5 mg by mouth at bedtime.  09/02/19  Yes Satira Sark, MD  Apoaequorin (PREVAGEN) 10 MG CAPS Take 10 mg by mouth daily.    Yes [provider]  brimonidine (ALPHAGAN) 0.15 % ophthalmic solution Place 1 drop into the right eye 2 (two) times daily. 12/17/19  Yes [provider]  cholecalciferol (VITAMIN D3) 25 MCG (1000 UT) tablet Take 1,000 Units by mouth daily.   Yes [provider]  ELIQUIS 2.5 MG TABS tablet TAKE ONE TABLET BY MOUTH TWICE DAILY. (MORNING ,BEDTIME) Patient taking differently: Take  2.5 mg by mouth 2 (two) times daily.  07/25/19  Yes Satira Sark, MD  finasteride (PROSCAR) 5 MG tablet Take 5 mg by mouth daily.     Yes [provider]  Lactase 9000 units CHEW Chew 1 tablet by mouth 3 (three) times daily as needed (when eating dairy products).    Yes [provider]  melatonin 3 MG TABS tablet Take 9 mg by mouth at bedtime.   Yes [provider]  Multiple Vitamin (MULTIVITAMIN WITH MINERALS) TABS tablet Take 1 tablet by mouth daily.   Yes [provider]  NITROSTAT 0.4 MG SL tablet PLACE ONE TABLET UNDER THE TONGUE EVERY 5 MINUTES AS NEEDED FOR CHEST PAIN Patient taking differently: Place 0.4 mg under the tongue every 5 (five) minutes as needed for chest pain.  12/20/16  Yes Satira Sark, MD  pantoprazole (PROTONIX) 40 MG tablet Take 40 mg by mouth daily.  01/17/19  Yes [provider]  prednisoLONE acetate (PRED FORTE)  1 % ophthalmic suspension Place 1 drop into the right eye at bedtime. 10/15/19  Yes [provider]  simvastatin (ZOCOR) 20 MG tablet Take 10 mg by mouth at bedtime. 01/17/19  Yes [provider]  Tamsulosin HCl (FLOMAX) 0.4 MG CAPS Take 0.4 mg by mouth at bedtime.    Yes [provider]  testosterone cypionate (DEPOTESTOSTERONE CYPIONATE) 200 MG/ML injection Inject 140 mg into the muscle every 14 (fourteen) days. 09/16/19  Yes [provider]  triamcinolone cream (KENALOG) 0.1 % Apply 1 application topically daily as needed (wound care).  09/09/19  Yes [provider]    Inpatient Medications: Scheduled Meds: . amLODipine  2.5 mg Oral QHS  . brimonidine  1 drop Right Eye BID  . cholecalciferol  1,000 Units Oral Daily  . finasteride  5 mg Oral Daily  . melatonin  9 mg Oral QHS  . multivitamin with minerals  1 tablet Oral Daily  . pantoprazole  40 mg Oral Daily  . prednisoLONE acetate  1 drop Right Eye QHS  . simvastatin  10 mg Oral QHS  . tamsulosin  0.4 mg Oral QHS    Continuous Infusions:  PRN Meds:   Allergies:   No Known Allergies  Social History:   Social History   Socioeconomic History  . Marital status: Married    Spouse name: Not on file  . Number of children: 7  . Years of education: Not on file  . Highest education level: Not on file  Occupational History  . Occupation: RETIRED    Employer: OTHER    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 [employer]  Tobacco Use  . Smoking status: Former Smoker    Packs/day: 2.00    Years: 25.00    Pack years: 50.00    Types: Cigarettes    Quit date: 08/15/1962    Years since quitting: 57.4  . Smokeless tobacco: Never Used  Substance and Sexual Activity  . Alcohol use: No    Alcohol/week: 0.0 standard drinks  . Drug use: No  . Sexual activity: Not on file  Other Topics Concern  . Not on file  Social History Narrative  . Not on file   Social Determinants of Health   Financial Resource Strain:   . Difficulty of Paying Living Expenses:   Food Insecurity:   . Worried About Charity fundraiser in the Last Year:   . Arboriculturist in the Last Year:   Transportation Needs:   . Film/video editor (Medical):   Benjamin Kitchen Lack of Transportation (Non-Medical):   Physical Activity:   . Days of Exercise per Week:   . Minutes of Exercise per Session:   Stress:   . Feeling of Stress :   Social Connections:   . Frequency of Communication with Friends and Family:   . Frequency of Social Gatherings with Friends and Family:   . Attends Religious Services:   . Active Member of Clubs or Organizations:   . Attends Archivist Meetings:   Benjamin Kitchen Marital Status:   Intimate Partner Violence:   . Fear of Current or Ex-Partner:   . Emotionally Abused:   Benjamin Kitchen Physically Abused:   . Sexually Abused:     Family History:   Family History  Problem Relation Age of Onset  . Heart attack Father        in his early 72's     ROS:  Please see the history of present illness.  All other ROS reviewed  and negative.      Physical Exam/Data:   Vitals:   12/30/19 0542 12/30/19 0600 12/30/19 0615 12/30/19 0630  BP: 132/87 (!) 141/80  (!) 141/85  Pulse: 95 82 78 80  Resp: 11 14 14 17   Temp:      TempSrc:      SpO2: 96% 96% 95% 95%   No intake or output data in the 24 hours ending 12/30/19 0707 Last 3 Weights 12/26/2019 12/16/2019 07/23/2019  Weight (lbs) 164 lb 169 lb 182 lb 6.4 oz  Weight (kg) 74.39 kg 76.658 kg 82.736 kg     There is no height or weight on file to calculate BMI.  General:  Well nourished, well developed, in no acute distress HEENT: normal Lymph: no adenopathy Neck: no JVD Endocrine:  No thryomegaly Vascular: No carotid bruits Cardiac:  irreg-irrrg; no murmur s, gallops or rubs Lungs:  CTA b/l, no wheezing, rhonchi or rales  Abd: soft, nontender Ext: no edema Musculoskeletal:  No deformities Skin: warm and dry  Neuro:  no gross focal motor abnormalities noted Psych:  Normal affect     EKG:  The EKG was personally reviewed and demonstrates:   AFib 79bpm, PVC (have 2 morphologies)  Telemetry:  Telemetry was personally reviewed and demonstrates:   AFib 5's-15's, intermittently frequent PVCs (multifocal     Relevant CV Studies:   07/18/2019: lexiscan stress myoview  There was no ST segment deviation noted during stress.  Findings consistent with prior inferior/inferoseptal myocardial infarction. There is no current ischemia.  This is an intermediate risk study. Risk based on decreased LVEF, there is no current ischemia. Recommend correlating LVEF with echo.  The left ventricular ejection fraction is mildly decreased (45-54%).   07/09/2019: TTE IMPRESSIONS  1. Left ventricular ejection fraction, by visual estimation, is 55 to  60%. The left ventricle has normal function. There is no left ventricular  hypertrophy.  2. Left ventricular diastolic parameters are indeterminate.  3. Global right ventricle has normal systolic function.The right  ventricular size is  mildly enlarged. No increase in right ventricular wall  thickness.  4. Left atrial size was moderately dilated.  5. Right atrial size was moderately dilated.  6. Mild mitral annular calcification.  7. Mild to moderate aortic valve annular calcification.  8. The mitral valve is degenerative. Mild mitral valve regurgitation.  9. The tricuspid valve is grossly normal. Tricuspid valve regurgitation  is mild.  10. The aortic valve is tricuspid. Aortic valve regurgitation is mild.  Mild aortic valve sclerosis without stenosis.  11. The pulmonic valve was grossly normal. Pulmonic valve regurgitation is  mild.  12. Normal pulmonary artery systolic pressure.  13. The tricuspid regurgitant velocity is 2.23 m/s, and with an assumed  right atrial pressure of 8 mmHg, the estimated right ventricular systolic  pressure is normal at 27.9 mmHg.  14. The inferior vena cava is normal in size with <50% respiratory  variability, suggesting right atrial pressure of 8 mmHg.     11/18/2016: LHC  Ost RCA to Mid RCA lesion, 25 %stenosed.  Dist RCA lesion, 45 %stenosed.  Ost LAD to Prox LAD lesion, 0 %stenosed.  Prox LAD to Mid LAD lesion, 35 %stenosed.  LV end diastolic pressure is normal.  Mid LAD to Dist LAD lesion, 35 %stenosed.   1. Nonobstructive CAD 2. Normal LVEDP   Laboratory Data:  High Sensitivity Troponin:  No results for input(s): TROPONINIHS in the last 720 hours.   Chemistry Recent Labs  Lab 12/29/19 1542  12/29/19 2021 12/30/19 0325  NA 142  --  140  K 4.7  --  4.9  CL 106  --  110  CO2 25  --  21*  GLUCOSE 84  --  89  BUN 34*  --  31*  CREATININE 1.93* 1.85* 1.80*  CALCIUM 9.8  --  9.4  GFRNONAA 31* 32* 33*  GFRAA 36* 37* 39*  ANIONGAP 11  --  9    Recent Labs  Lab 12/29/19 1542  PROT 6.9  ALBUMIN 3.9  AST 29  ALT 19  ALKPHOS 61  BILITOT 1.1   Hematology Recent Labs  Lab 12/29/19 1542 12/29/19 2021 12/30/19 0325  WBC 4.3 4.0 5.0  RBC 3.33* 3.50*  3.32*  HGB 11.7* 12.3* 11.7*  HCT 36.5* 38.1* 36.1*  MCV 109.6* 108.9* 108.7*  MCH 35.1* 35.1* 35.2*  MCHC 32.1 32.3 32.4  RDW 11.9 12.0 11.9  PLT 170 164 165   BNPNo results for input(s): BNP, PROBNP in the last 168 hours.  DDimer No results for input(s): DDIMER in the last 168 hours.   Radiology/Studies:  No results found. {  Assessment and Plan:   1. Unclear feeling of dizziness, or feeling "off"     Known marked V pause on his recent monitor and notable RVR here intermittently as well     Tachy-brady  He was already planned for PPM Admitted last night  Scheduled for PPM today with Dr. Caryl Comes  I have discussed PPM implant procedure, rational for implant. Discussed potential risks and benefits he would like to proceed Discussed unclear etiology of his dizziness, He has not been brady here His daughter mentions these are moments of dizziness, and feeling strange at hime    2. Permanent AFib     Resume BB post pacing for his RVR     Hold eliquis for now  3. PVCs         No clear symptoms     No ischemic on his stress test and preserved LVEF        For questions or updates, please contact [place] Please consult [email or web address] for contact info under     Signed, Baldwin Jamaica, PA-C  12/30/2019 7:07 AM  Deglutition syncope  Atrial fib permanent  PVCs  Documented bradycardia  Renal insufficiency grade 3    Reviewed story with daughters   Things are a little clearer now as deglutition syncope and presyncope has been described and the timing with the monitor now fits  The benefits and risks were reviewed including but not limited to death,  perforation, infection, lead dislodgement and device malfunction.  The patient understands agrees and is willing to proceed.  Also has afib with more rapid HR today and so will benefit from resuming his betablockers

## 2019-12-30 NOTE — Interval H&P Note (Signed)
History and Physical Interval Note:  12/30/2019 10:22 AM  Benjamin Mason  has presented today for surgery, with the diagnosis of bradycardia.  The various methods of treatment have been discussed with the patient and family. After consideration of risks, benefits and other options for treatment, the patient has consented to  Procedure(s): PACEMAKER IMPLANT (N/A) as a surgical intervention.  The patient's history has been reviewed, patient examined, no change in status, stable for surgery.  I have reviewed the patient's chart and labs.  Questions were answered to the patient's satisfaction.     Virl Axe

## 2019-12-30 NOTE — Discharge Instructions (Signed)
    Supplemental Discharge Instructions for  Pacemaker/Defibrillator Patients  Activity No heavy lifting or vigorous activity with your left/right arm for 6 to 8 weeks.  Do not raise your left/right arm above your head for one week.  Gradually raise your affected arm as drawn below.             01/03/2020                 01/04/2020                01/05/2020              01/06/2020 __  NO DRIVING for  1 week   ; you may begin driving on  [id number] .  WOUND CARE - Keep the wound area clean and dry.  Do not get this area wet for24 hours. You may shower on  01/01/2020   . - Dr. Caryl Comes used DERMABOND (skin glue) on your wound, DO NOT peel this off. - No bandage is needed on the site.  DO  NOT apply any creams, oils, or ointments to the wound area. - If you notice any drainage or discharge from the wound, any swelling or bruising at the site, or you develop a fever > 101? F after you are discharged home, call the office at once.  Special Instructions - You are still able to use cellular telephones; use the ear opposite the side where you have your pacemaker/defibrillator.  Avoid carrying your cellular phone near your device. - When traveling through airports, show security personnel your identification card to avoid being screened in the metal detectors.  Ask the security personnel to use the hand wand. - Avoid arc welding equipment, MRI testing (magnetic resonance imaging), TENS units (transcutaneous nerve stimulators).  Call the office for questions about other devices. - Avoid electrical appliances that are in poor condition or are not properly grounded. - Microwave ovens are safe to be near or to operate.

## 2019-12-30 NOTE — Progress Notes (Addendum)
Follow up on initial presumptive positive COVID test, ran on Cephid followed by negative ran on Holigic  Microbiology retested both (same) samples again, 1st again presumptive positive though on Hologic, and the second negative on Cephid.  Discussed with ID NP, unclear significance or meaning of "presumptove positive"  Though given he had COVID illness in Dec/Jan and had been vaccinated, not likely to require anything further though referred me to infection prevention. Spoke with E.C., he agreed, givin no symptoms of illness, natural disease in Dec/Jan (pt reported positive test Dec 31) and subsequently vaccinated, no need for isolation or further testing.  Tommye Standard, PA-C

## 2019-12-31 ENCOUNTER — Encounter (HOSPITAL_COMMUNITY): Payer: Self-pay

## 2019-12-31 ENCOUNTER — Ambulatory Visit (HOSPITAL_COMMUNITY): Admit: 2019-12-31 | Payer: Medicare Other | Admitting: Internal Medicine

## 2019-12-31 ENCOUNTER — Observation Stay (HOSPITAL_COMMUNITY): Payer: Medicare Other

## 2019-12-31 DIAGNOSIS — I495 Sick sinus syndrome: Secondary | ICD-10-CM | POA: Diagnosis not present

## 2019-12-31 DIAGNOSIS — I130 Hypertensive heart and chronic kidney disease with heart failure and stage 1 through stage 4 chronic kidney disease, or unspecified chronic kidney disease: Secondary | ICD-10-CM | POA: Diagnosis not present

## 2019-12-31 DIAGNOSIS — Z20822 Contact with and (suspected) exposure to covid-19: Secondary | ICD-10-CM | POA: Diagnosis not present

## 2019-12-31 DIAGNOSIS — Z950 Presence of cardiac pacemaker: Secondary | ICD-10-CM | POA: Diagnosis not present

## 2019-12-31 DIAGNOSIS — I252 Old myocardial infarction: Secondary | ICD-10-CM | POA: Diagnosis not present

## 2019-12-31 DIAGNOSIS — Z955 Presence of coronary angioplasty implant and graft: Secondary | ICD-10-CM | POA: Diagnosis not present

## 2019-12-31 DIAGNOSIS — R55 Syncope and collapse: Secondary | ICD-10-CM | POA: Diagnosis not present

## 2019-12-31 LAB — BASIC METABOLIC PANEL
Anion gap: 9 (ref 5–15)
BUN: 31 mg/dL — ABNORMAL HIGH (ref 8–23)
CO2: 24 mmol/L (ref 22–32)
Calcium: 9 mg/dL (ref 8.9–10.3)
Chloride: 105 mmol/L (ref 98–111)
Creatinine, Ser: 1.88 mg/dL — ABNORMAL HIGH (ref 0.61–1.24)
GFR calc Af Amer: 37 mL/min — ABNORMAL LOW (ref >60)
GFR calc non Af Amer: 32 mL/min — ABNORMAL LOW (ref >60)
Glucose, Bld: 106 mg/dL — ABNORMAL HIGH (ref 70–99)
Potassium: 4.4 mmol/L (ref 3.5–5.1)
Sodium: 138 mmol/L (ref 135–145)

## 2019-12-31 SURGERY — PACEMAKER IMPLANT

## 2019-12-31 MED ORDER — ATENOLOL 50 MG PO TABS
50.0000 mg | ORAL_TABLET | Freq: Two times a day (BID) | ORAL | Status: DC
  Filled 2019-12-31: qty 1

## 2019-12-31 MED ORDER — ATENOLOL 50 MG PO TABS: 50.0000 mg | ORAL_TABLET | Freq: Two times a day (BID) | ORAL | 6 refills | Status: DC

## 2019-12-31 MED FILL — Lidocaine HCl Local Inj 1%: INTRAMUSCULAR | Qty: 60 | Status: AC

## 2019-12-31 NOTE — Progress Notes (Signed)
Pt ambulated in the hallway did very well. Returned to room.

## 2019-12-31 NOTE — Discharge Summary (Signed)
ELECTROPHYSIOLOGY PROCEDURE DISCHARGE SUMMARY    Patient ID: Benjamin Mason,  MRN: [id number], DOB/AGE: Jun 05, 1931 86 y.o.  Admit date: 12/29/2019 Discharge date: 12/31/2019  Primary Care Physician: Manon Hilding, MD  Primary Cardiologist: Dr. Domenic Polite Electrophysiologist: Dr. Caryl Comes  Primary Discharge Diagnosis:  1. Tachycardia  2. Symptomatic bradycardia  Secondary Discharge Diagnosis:  1. CKD (III) 2. CAD 3. PVCs 4. Permanent AFib     CHA2DS2Vasc is 3, on Eliquis, appropriately dosed for Creat/age 60. PVCs   No Known Allergies   Procedures This Admission:  1.  Implantation of a SJM sungle chamber PPM on 12/30/2019 by Dr Caryl Comes.  The patient received a St Jude  pulse generator serial number [id number], St Jude  ventricular lead serial number [id number]  There were no immediate post procedure complications. 2.  CXR on 12/31/2019 demonstrated no pneumothorax status post device implantation.   Brief HPI: Benjamin Mason is a 86 y.o. male with PMHx as above was referred to electrophysiology in the outpatient setting for evaluation with frequent pVCs, V pause s of 4-8seconds on an wearable monitor for evaluation and consideration of PPM implantation.  Past medical history includes above.  The patient was planned for PPM electively, though had further symptoms of dizziness and came to [facility] ER.   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  He and underwent implantation of a PPM with details as outlined above.  Post implant atenolol was ordered but not given, in d/w today's RN, unclear why, he  was monitored on telemetry overnight which AFib 90's-120's.  Left chest was without hematoma or ecchymosis.  The device was interrogated and found to be functioning normally.  CXR was obtained and demonstrated no pneumothorax status post device implantation.  Wound  care, arm mobility, and restrictions were reviewed with the patient.  The patient had some unsteadiness on his feet, orthostatics looked OK, with no dip in BP, he did have RVR after standing 3 minutes.  Will stop his amlodipine He feels well, has ambulated without difficulty, was examined by Dr. Caryl Comes and considered stable for discharge to home.   Hold Eliquis for 2 days post implant  Physical Exam: Vitals:   12/31/19 0032 12/31/19 0459 12/31/19 0723 12/31/19 0850  BP: 113/68 118/67 131/79 114/69  Pulse: 88 87 94 86  Resp: 19 16 15 16   Temp: 98.1 F (36.7 C) 98.6 F (37 C) 98 F (36.7 C) 97.6 F (36.4 C)  TempSrc: Oral Oral Oral Oral  SpO2: 96% 96% 95% 96%  Weight:  71.6 kg      GEN- The patient is well appearing, alert and oriented x 3 today.   HEENT: normocephalic, atraumatic; sclera clear, conjunctiva pink; hearing intact; oropharynx clear; neck supple, no JVP Lungs- Clear to ausculation bilaterally, normal work of breathing.  No wheezes, rales, rhonchi Heart- Regular rate and rhythm, no murmurs, rubs or gallops, PMI not laterally displaced GI- soft, non-tender, non-distended, bowel sounds present, no hepatosplenomegaly Extremities- no clubbing, cyanosis, or edema; DP/PT/radial pulses 2+ bilaterally MS- no significant deformity or atrophy Skin- warm and dry, no rash or lesion, left chest without hematoma/ecchymosis Psych- euthymic mood, full affect Neuro- no gross deficits   Labs:   Lab Results  Component Value Date   WBC 5.0 12/30/2019   HGB 11.7 (L) 12/30/2019   HCT 36.1 (L) 12/30/2019  MCV 108.7 (H) 12/30/2019   PLT 165 12/30/2019    Recent Labs  Lab 12/29/19 1542 12/29/19 2021 12/31/19 0226  NA 142   < > 138  K 4.7   < > 4.4  CL 106   < > 105  CO2 25   < > 24  BUN 34*   < > 31*  CREATININE 1.93*   < > 1.88*  CALCIUM 9.8   < > 9.0  PROT 6.9  --   --   BILITOT 1.1  --   --   ALKPHOS 61  --   --   ALT 19  --   --   AST 29  --   --   GLUCOSE 84   < >  106*   < > = values in this interval not displayed.    Discharge Medications:  Allergies as of 12/31/2019   No Known Allergies     Medication List    STOP taking these medications   amLODipine 2.5 MG tablet Commonly known as: NORVASC     TAKE these medications   atenolol 50 MG tablet Commonly known as: TENORMIN Take 1 tablet (50 mg total) by mouth 2 (two) times daily.   brimonidine 0.15 % ophthalmic solution Commonly known as: ALPHAGAN Place 1 drop into the right eye 2 (two) times daily.   cholecalciferol 25 MCG (1000 UNIT) tablet Commonly known as: VITAMIN D3 Take 1,000 Units by mouth daily.   Eliquis 2.5 MG Tabs tablet Generic drug: apixaban TAKE ONE TABLET BY MOUTH TWICE DAILY. (MORNING ,BEDTIME) What changed: See the new instructions. Notes to patient: DO NOT resume until Thursday 01/02/2020   finasteride 5 MG tablet Commonly known as: PROSCAR Take 5 mg by mouth daily.   Flomax 0.4 MG Caps capsule Generic drug: tamsulosin Take 0.4 mg by mouth at bedtime.   Lactase 9000 units Chew Chew 1 tablet by mouth 3 (three) times daily as needed (when eating dairy products).   melatonin 3 MG Tabs tablet Take 9 mg by mouth at bedtime.   multivitamin with minerals Tabs tablet Take 1 tablet by mouth daily.   Nitrostat 0.4 MG SL tablet Generic drug: nitroGLYCERIN PLACE ONE TABLET UNDER THE TONGUE EVERY 5 MINUTES AS NEEDED FOR CHEST PAIN What changed: See the new instructions.   pantoprazole 40 MG tablet Commonly known as: PROTONIX Take 40 mg by mouth daily.   prednisoLONE acetate 1 % ophthalmic suspension Commonly known as: PRED FORTE Place 1 drop into the right eye at bedtime.   Prevagen 10 MG Caps Generic drug: Apoaequorin Take 10 mg by mouth daily.   simvastatin 20 MG tablet Commonly known as: ZOCOR Take 10 mg by mouth at bedtime.   testosterone cypionate 200 MG/ML injection Commonly known as: DEPOTESTOSTERONE CYPIONATE Inject 140 mg into the muscle  every 14 (fourteen) days.   triamcinolone cream 0.1 % Commonly known as: KENALOG Apply 1 application topically daily as needed (wound care).   Tylenol Arthritis Pain 650 MG CR tablet Generic drug: acetaminophen Take 1,300 mg by mouth 2 (two) times daily.       Disposition: Home   Discharge Instructions    Diet - low sodium heart healthy   Complete by: As directed    Increase activity slowly   Complete by: As directed      Follow-up Information    [facility] Follow up.   Specialty: Cardiology Why: 01/09/2020 @# 4:00PM, wound check visit Contact information: [id number]  [address], [address] [place] [phone]       Deboraha Sprang, MD Follow up.   Specialty: Cardiology Why: 04/07/2020 @ 2:15PM Contact information: [address] [place] [address] [phone]           Duration of Discharge Encounter: Greater than 30 minutes including physician time.  Venetia Night, PA-C 12/31/2019 11:26 AM

## 2019-12-31 NOTE — Progress Notes (Signed)
Pt given discharge instructions daughter at the bedside. IV tele and IV removed. Taken out via wheelchair. Belongings taken out with daughter

## 2020-01-07 DIAGNOSIS — R410 Disorientation, unspecified: Secondary | ICD-10-CM | POA: Diagnosis not present

## 2020-01-07 DIAGNOSIS — D649 Anemia, unspecified: Secondary | ICD-10-CM | POA: Diagnosis not present

## 2020-01-07 DIAGNOSIS — D513 Other dietary vitamin B12 deficiency anemia: Secondary | ICD-10-CM | POA: Diagnosis not present

## 2020-01-07 DIAGNOSIS — I495 Sick sinus syndrome: Secondary | ICD-10-CM | POA: Diagnosis not present

## 2020-01-07 DIAGNOSIS — I482 Chronic atrial fibrillation, unspecified: Secondary | ICD-10-CM | POA: Diagnosis not present

## 2020-01-07 DIAGNOSIS — I4891 Unspecified atrial fibrillation: Secondary | ICD-10-CM | POA: Diagnosis not present

## 2020-01-07 DIAGNOSIS — I10 Essential (primary) hypertension: Secondary | ICD-10-CM | POA: Diagnosis not present

## 2020-01-09 ENCOUNTER — Other Ambulatory Visit: Payer: Self-pay

## 2020-01-09 ENCOUNTER — Ambulatory Visit (INDEPENDENT_AMBULATORY_CARE_PROVIDER_SITE_OTHER): Payer: Medicare Other | Admitting: Emergency Medicine

## 2020-01-09 DIAGNOSIS — I455 Other specified heart block: Secondary | ICD-10-CM

## 2020-01-09 DIAGNOSIS — I495 Sick sinus syndrome: Secondary | ICD-10-CM | POA: Diagnosis not present

## 2020-01-09 LAB — CUP PACEART INCLINIC DEVICE CHECK
Battery Remaining Longevity: 128 mo
Battery Voltage: 3.07 V
Brady Statistic RV Percent Paced: 16 %
Date Time Interrogation Session: 20210527152133
Implantable Lead Implant Date: 20210517
Implantable Lead Location: 753860
Implantable Pulse Generator Implant Date: 20210517
Lead Channel Impedance Value: 512.5 Ohm
Lead Channel Pacing Threshold Amplitude: 0.75 V
Lead Channel Pacing Threshold Pulse Width: 0.5 ms
Lead Channel Sensing Intrinsic Amplitude: 3.1 mV
Lead Channel Setting Pacing Amplitude: 3.5 V
Lead Channel Setting Pacing Pulse Width: 0.5 ms
Lead Channel Setting Sensing Sensitivity: 0.7 mV
Pulse Gen Model: 1272
Pulse Gen Serial Number: 3800831

## 2020-01-09 NOTE — Progress Notes (Signed)
Wound check appointment. Steri-strips removed. Wound without redness or edema. Incision edges approximated, wound well healed. Normal device function. Thresholds, sensing, and impedances consistent with implant measurements. Device programmed at 3.5V/auto capture programmed on for extra safety margin until 3 month visit. Histogram distribution appropriate for patient and level of activity. No high ventricular rates noted. Patient  and family educated about wound care, arm mobility, lifting restrictions. ROV with DR Caryl Comes on 04/07/20. Next remote transmission on 04/02/20 and every 91 days after.

## 2020-01-11 DIAGNOSIS — R112 Nausea with vomiting, unspecified: Secondary | ICD-10-CM | POA: Diagnosis not present

## 2020-01-11 DIAGNOSIS — K5900 Constipation, unspecified: Secondary | ICD-10-CM | POA: Diagnosis not present

## 2020-01-11 DIAGNOSIS — R410 Disorientation, unspecified: Secondary | ICD-10-CM | POA: Diagnosis not present

## 2020-01-11 DIAGNOSIS — R42 Dizziness and giddiness: Secondary | ICD-10-CM | POA: Diagnosis not present

## 2020-01-11 DIAGNOSIS — R2689 Other abnormalities of gait and mobility: Secondary | ICD-10-CM | POA: Diagnosis not present

## 2020-01-21 DIAGNOSIS — I10 Essential (primary) hypertension: Secondary | ICD-10-CM | POA: Diagnosis not present

## 2020-01-21 DIAGNOSIS — R739 Hyperglycemia, unspecified: Secondary | ICD-10-CM | POA: Diagnosis not present

## 2020-01-21 DIAGNOSIS — N183 Chronic kidney disease, stage 3 unspecified: Secondary | ICD-10-CM | POA: Diagnosis not present

## 2020-01-21 DIAGNOSIS — K2100 Gastro-esophageal reflux disease with esophagitis, without bleeding: Secondary | ICD-10-CM | POA: Diagnosis not present

## 2020-01-21 DIAGNOSIS — E875 Hyperkalemia: Secondary | ICD-10-CM | POA: Diagnosis not present

## 2020-01-24 ENCOUNTER — Ambulatory Visit: Payer: Medicare Other | Admitting: Cardiology

## 2020-01-30 DIAGNOSIS — I10 Essential (primary) hypertension: Secondary | ICD-10-CM | POA: Diagnosis not present

## 2020-01-30 DIAGNOSIS — I495 Sick sinus syndrome: Secondary | ICD-10-CM | POA: Diagnosis not present

## 2020-01-30 DIAGNOSIS — D649 Anemia, unspecified: Secondary | ICD-10-CM | POA: Diagnosis not present

## 2020-01-30 DIAGNOSIS — I4891 Unspecified atrial fibrillation: Secondary | ICD-10-CM | POA: Diagnosis not present

## 2020-02-03 ENCOUNTER — Ambulatory Visit: Payer: Medicare Other | Admitting: Cardiology

## 2020-02-10 DIAGNOSIS — G3184 Mild cognitive impairment, so stated: Secondary | ICD-10-CM | POA: Diagnosis not present

## 2020-02-10 DIAGNOSIS — I10 Essential (primary) hypertension: Secondary | ICD-10-CM | POA: Diagnosis not present

## 2020-02-10 DIAGNOSIS — Z0000 Encounter for general adult medical examination without abnormal findings: Secondary | ICD-10-CM | POA: Diagnosis not present

## 2020-02-10 DIAGNOSIS — E782 Mixed hyperlipidemia: Secondary | ICD-10-CM | POA: Diagnosis not present

## 2020-02-10 DIAGNOSIS — D638 Anemia in other chronic diseases classified elsewhere: Secondary | ICD-10-CM | POA: Diagnosis not present

## 2020-02-10 DIAGNOSIS — Z6823 Body mass index (BMI) 23.0-23.9, adult: Secondary | ICD-10-CM | POA: Diagnosis not present

## 2020-02-12 DIAGNOSIS — I129 Hypertensive chronic kidney disease with stage 1 through stage 4 chronic kidney disease, or unspecified chronic kidney disease: Secondary | ICD-10-CM | POA: Diagnosis not present

## 2020-02-12 DIAGNOSIS — I482 Chronic atrial fibrillation, unspecified: Secondary | ICD-10-CM | POA: Diagnosis not present

## 2020-02-12 DIAGNOSIS — N183 Chronic kidney disease, stage 3 unspecified: Secondary | ICD-10-CM | POA: Diagnosis not present

## 2020-02-12 DIAGNOSIS — E7849 Other hyperlipidemia: Secondary | ICD-10-CM | POA: Diagnosis not present

## 2020-02-18 DIAGNOSIS — T8522XD Displacement of intraocular lens, subsequent encounter: Secondary | ICD-10-CM | POA: Diagnosis not present

## 2020-02-18 DIAGNOSIS — H2101 Hyphema, right eye: Secondary | ICD-10-CM | POA: Diagnosis not present

## 2020-02-18 DIAGNOSIS — H18231 Secondary corneal edema, right eye: Secondary | ICD-10-CM | POA: Diagnosis not present

## 2020-02-18 DIAGNOSIS — H353132 Nonexudative age-related macular degeneration, bilateral, intermediate dry stage: Secondary | ICD-10-CM | POA: Diagnosis not present

## 2020-02-21 DIAGNOSIS — H524 Presbyopia: Secondary | ICD-10-CM | POA: Diagnosis not present

## 2020-03-03 DIAGNOSIS — H43813 Vitreous degeneration, bilateral: Secondary | ICD-10-CM | POA: Diagnosis not present

## 2020-03-03 DIAGNOSIS — T8522XD Displacement of intraocular lens, subsequent encounter: Secondary | ICD-10-CM | POA: Diagnosis not present

## 2020-03-03 DIAGNOSIS — H353132 Nonexudative age-related macular degeneration, bilateral, intermediate dry stage: Secondary | ICD-10-CM | POA: Diagnosis not present

## 2020-03-05 ENCOUNTER — Other Ambulatory Visit: Payer: Self-pay | Admitting: Cardiology

## 2020-03-06 ENCOUNTER — Telehealth: Payer: Self-pay | Admitting: Cardiology

## 2020-03-06 NOTE — Telephone Encounter (Signed)
Daughter called office back to get response given to patient. Daughter informed of below response. Daughter Maudie Mercury) says she has called PCP and was advised that patient needed to go to the ED for an evaluation. Daughter asked if we could see patient in the office today. Advised that no appointments available today. Advised daughter for chest pain rated 7/10, patient would need to be seen in the ED. Verbalized understanding.

## 2020-03-06 NOTE — Telephone Encounter (Signed)
Patient called stating that he was walking around the house and started having chest pains. States that he went back to bed and the pain became worse. Got up and the pain has stopped. Has not taken any medications this am.  # [phone].

## 2020-03-06 NOTE — Telephone Encounter (Signed)
Reports occasionally having chest pain when swallowing food d/t esophageal spasms. Reports today experiencing chest pain rated 7/10, that was worse after laying down and after sitting up the chest pain resolved. Denies dizziness or SOB during that time. Vitals were taken by daughter and were normal. Denis chest pain now. Reports that he feels much better. Advised to contact his PCP and or GI doctor with these symptoms. Advised if chest pain returns, to go to the ED for an evaluation. Verbalized understanding of plan.

## 2020-03-09 DIAGNOSIS — R42 Dizziness and giddiness: Secondary | ICD-10-CM | POA: Diagnosis not present

## 2020-03-09 DIAGNOSIS — R269 Unspecified abnormalities of gait and mobility: Secondary | ICD-10-CM | POA: Diagnosis not present

## 2020-03-11 DIAGNOSIS — R269 Unspecified abnormalities of gait and mobility: Secondary | ICD-10-CM | POA: Diagnosis not present

## 2020-03-11 DIAGNOSIS — R42 Dizziness and giddiness: Secondary | ICD-10-CM | POA: Diagnosis not present

## 2020-03-13 DIAGNOSIS — N183 Chronic kidney disease, stage 3 unspecified: Secondary | ICD-10-CM | POA: Diagnosis not present

## 2020-03-13 DIAGNOSIS — E7849 Other hyperlipidemia: Secondary | ICD-10-CM | POA: Diagnosis not present

## 2020-03-13 DIAGNOSIS — I129 Hypertensive chronic kidney disease with stage 1 through stage 4 chronic kidney disease, or unspecified chronic kidney disease: Secondary | ICD-10-CM | POA: Diagnosis not present

## 2020-03-13 DIAGNOSIS — I482 Chronic atrial fibrillation, unspecified: Secondary | ICD-10-CM | POA: Diagnosis not present

## 2020-03-17 ENCOUNTER — Ambulatory Visit: Payer: Medicare Other | Admitting: Cardiology

## 2020-03-17 DIAGNOSIS — H2101 Hyphema, right eye: Secondary | ICD-10-CM | POA: Diagnosis not present

## 2020-03-18 ENCOUNTER — Encounter: Payer: Self-pay | Admitting: Cardiology

## 2020-03-18 ENCOUNTER — Ambulatory Visit (INDEPENDENT_AMBULATORY_CARE_PROVIDER_SITE_OTHER): Payer: Medicare Other | Admitting: Cardiology

## 2020-03-18 VITALS — BP 118/72 | HR 64 | Ht 72.0 in | Wt 173.0 lb

## 2020-03-18 DIAGNOSIS — I4821 Permanent atrial fibrillation: Secondary | ICD-10-CM | POA: Diagnosis not present

## 2020-03-18 DIAGNOSIS — I25119 Atherosclerotic heart disease of native coronary artery with unspecified angina pectoris: Secondary | ICD-10-CM | POA: Diagnosis not present

## 2020-03-18 DIAGNOSIS — I493 Ventricular premature depolarization: Secondary | ICD-10-CM | POA: Diagnosis not present

## 2020-03-18 DIAGNOSIS — I495 Sick sinus syndrome: Secondary | ICD-10-CM | POA: Diagnosis not present

## 2020-03-18 MED ORDER — NITROGLYCERIN 0.4 MG SL SUBL: 0.4000 mg | SUBLINGUAL_TABLET | SUBLINGUAL | 3 refills | Status: DC | PRN

## 2020-03-18 NOTE — Progress Notes (Signed)
Cardiology Office Note  Date: 03/18/2020   ID: Benjamin Mason, DOB 06-14-1931, MRN [id number]  PCP:  Manon Hilding, MD  Cardiologist:  Rozann Lesches, MD Electrophysiologist:  None   Chief Complaint  Patient presents with  . Cardiac follow-up    History of Present Illness: Benjamin Mason is an 86 y.o. male last seen by Mr. Leonides Sake NP in May. He is here today for follow-up with family members.  Overall, doing reasonably well.  He did have an episode of suspected angina while walking uphill per discussion today.  Did not have to use nitroglycerin.  This has not been a recurring symptom.  EP consultation via video encounter noted with Dr. Caryl Comes in May.  I reviewed his note and recommendations.  Patient ultimately underwent placement of a single-chamber St. Jude pacemaker on May 17.  He does not report any sudden dizziness or syncope.  I reviewed his medications which are stable and outlined below.  He does not report any bleeding problems on Eliquis.  We discussed getting a refill for a fresh bottle of nitroglycerin.  I personally reviewed his ECG today which shows intermittent ventricular pacing with underlying atrial fibrillation, nonspecific T wave changes.   Past Medical History:  Diagnosis Date  . Anxiety   . Aortic regurgitation    Mild to moderate  . CKD (chronic kidney disease) stage 3, GFR 30-59 ml/min   . Coronary atherosclerosis of native coronary artery    BMS to RCA and LAD 1995  . Essential hypertension   . Frequent PVCs   . IBS (irritable bowel syndrome)   . Lactose intolerance   . Mitral regurgitation    Moderate  . Mixed hyperlipidemia   . Permanent atrial fibrillation ([place])   . Sick sinus syndrome [facility])    St. Jude single-chamber pacemaker May 2021 - Dr. Caryl Comes  . Skin cancer     Past Surgical History:  Procedure Laterality Date  . BALLOON DILATION  08/03/2012   Procedure: BALLOON DILATION;  Surgeon: Rogene Houston, MD;  [facility];   Service: Endoscopy;  Laterality: N/A;  . COLONOSCOPY WITH ESOPHAGOGASTRODUODENOSCOPY (EGD)  08/03/2012   Procedure: COLONOSCOPY WITH ESOPHAGOGASTRODUODENOSCOPY (EGD);  Surgeon: Rogene Houston, MD;  Location: [facility];  Service: Endoscopy;  Laterality: N/A;  215  . LEFT HEART CATH AND CORONARY ANGIOGRAPHY N/A 11/18/2016   Procedure: Left Heart Cath and Coronary Angiography;  Surgeon: Peter M Martinique, MD;  Location: [place] [facility];  Service: Cardiovascular;  Laterality: N/A;  . LEFT INGUINAL HERNIA    . MALONEY DILATION  08/03/2012   Procedure: MALONEY DILATION;  Surgeon: Rogene Houston, MD;  [facility];  Service: Endoscopy;  Laterality: N/A;  . PACEMAKER IMPLANT N/A 12/30/2019   Procedure: PACEMAKER IMPLANT;  Surgeon: Deboraha Sprang, MD;  Location: [place] [facility];  Service: Cardiovascular;  Laterality: N/A;  . PENILE PROSTHESIS IMPLANT    . SAVORY DILATION  08/03/2012   Procedure: SAVORY DILATION;  Surgeon: Rogene Houston, MD;  [facility];  Service: Endoscopy;  Laterality: N/A;    Current Outpatient Medications  Medication Sig Dispense Refill  . acetaminophen (TYLENOL ARTHRITIS PAIN) 650 MG CR tablet Take 1,300 mg by mouth 2 (two) times daily.     Marland Kitchen apixaban (ELIQUIS) 2.5 MG TABS tablet Take 1 tablet (2.5 mg total) by mouth 2 (two) times daily. 60 tablet 6  . Apoaequorin (PREVAGEN) 10 MG CAPS Take 10 mg  by mouth daily.     Marland Kitchen atenolol (TENORMIN) 50 MG tablet Take 1 tablet (50 mg total) by mouth 2 (two) times daily. 60 tablet 6  . brimonidine (ALPHAGAN) 0.15 % ophthalmic solution Place 1 drop into the right eye 2 (two) times daily.    . cholecalciferol (VITAMIN D3) 25 MCG (1000 UT) tablet Take 1,000 Units by mouth daily.    . dorzolamide-timolol (COSOPT) 22.3-6.8 MG/ML ophthalmic solution 1 drop 2 (two) times daily.    . finasteride (PROSCAR) 5 MG tablet Take 5 mg by mouth daily.      . Lactase 9000 units CHEW Chew 1 tablet by mouth 3 (three) times daily  as needed (when eating dairy products).     . melatonin 3 MG TABS tablet Take 9 mg by mouth at bedtime.    . Multiple Vitamin (MULTIVITAMIN WITH MINERALS) TABS tablet Take 1 tablet by mouth daily.    . nitroGLYCERIN (NITROSTAT) 0.4 MG SL tablet Place 1 tablet (0.4 mg total) under the tongue every 5 (five) minutes x 3 doses as needed for chest pain (if no relief after 3rd dose, proceed to the ED for an evaluation or call 911). PLACE ONE TABLET UNDER THE TONGUE EVERY 5 MINUTES AS NEEDED FOR CHEST PAIN 25 tablet 3  . pantoprazole (PROTONIX) 40 MG tablet Take 40 mg by mouth daily.     . prednisoLONE acetate (PRED FORTE) 1 % ophthalmic suspension Place 1 drop into the right eye at bedtime.    . simvastatin (ZOCOR) 20 MG tablet Take 10 mg by mouth at bedtime.    . Tamsulosin HCl (FLOMAX) 0.4 MG CAPS Take 0.4 mg by mouth at bedtime.     Marland Kitchen testosterone cypionate (DEPOTESTOSTERONE CYPIONATE) 200 MG/ML injection Inject 140 mg into the muscle every 14 (fourteen) days.    Marland Kitchen triamcinolone cream (KENALOG) 0.1 % Apply 1 application topically daily as needed (wound care).      No current facility-administered medications for this visit.   Allergies:  Patient has no known allergies.   ROS: Hearing and memory loss.  Physical Exam: VS:  BP 118/72   Pulse 64   Ht 6' (1.829 m)   Wt 173 lb (78.5 kg)   SpO2 98%   BMI 23.46 kg/m , BMI Body mass index is 23.46 kg/m.  Wt Readings from Last 3 Encounters:  03/18/20 173 lb (78.5 kg)  12/31/19 157 lb 14.4 oz (71.6 kg)  12/26/19 164 lb (74.4 kg)    General: Elderly male, appears comfortable at rest. HEENT: Conjunctiva and lids normal, wearing a mask. Neck: Supple, no elevated JVP or carotid bruits, no thyromegaly. Lungs: Clear to auscultation, nonlabored breathing at rest. Cardiac: Regular rate and rhythm, no S3, soft systolic murmur, no pericardial rub. Extremities: No pitting edema, distal pulses 2+.  ECG:  An ECG dated 12/31/2019 was personally reviewed  today and demonstrated:  Atrial fibrillation with controlled ventricular response.  Recent Labwork: 12/29/2019: ALT 19; AST 29 12/30/2019: Hemoglobin 11.7; Platelets 165 12/31/2019: BUN 31; Creatinine, Ser 1.88; Potassium 4.4; Sodium 138   Other Studies Reviewed Today:  Echocardiogram 07/09/2019: 1. Left ventricular ejection fraction, by visual estimation, is 55 to  60%. The left ventricle has normal function. There is no left ventricular  hypertrophy.  2. Left ventricular diastolic parameters are indeterminate.  3. Global right ventricle has normal systolic function.The right  ventricular size is mildly enlarged. No increase in right ventricular wall  thickness.  4. Left atrial size was moderately dilated.  5. Right atrial size was moderately dilated.  6. Mild mitral annular calcification.  7. Mild to moderate aortic valve annular calcification.  8. The mitral valve is degenerative. Mild mitral valve regurgitation.  9. The tricuspid valve is grossly normal. Tricuspid valve regurgitation  is mild.  10. The aortic valve is tricuspid. Aortic valve regurgitation is mild.  Mild aortic valve sclerosis without stenosis.  11. The pulmonic valve was grossly normal. Pulmonic valve regurgitation is  mild.  12. Normal pulmonary artery systolic pressure.  13. The tricuspid regurgitant velocity is 2.23 m/s, and with an assumed  right atrial pressure of 8 mmHg, the estimated right ventricular systolic  pressure is normal at 27.9 mmHg.  14. The inferior vena cava is normal in size with <50% respiratory  variability, suggesting right atrial pressure of 8 mmHg.   Assessment and Plan:  1.  CAD status post BMS to the RCA and LAD in 1995.  Physician in 2018 revealed mild to moderate nonobstructive disease and he has been managed medically.  Does report an episode of exertional angina as discussed above.  This has not been a recurring symptom.  We will make sure he has a refill for fresh bottle  of nitroglycerin.  Otherwise continue atenolol and Zocor.  He is not on aspirin given use of Eliquis.  2.  Permanent atrial fibrillation.  Continue Eliquis for stroke prophylaxis.  I reviewed his lab work from May.  3.  Sick sinus syndrome with intermittent pauses, consultation noted with Dr. Caryl Comes and now status post single-chamber St. Jude pacemaker in May.  He is asymptomatic at this time.  4.  CKD stage III, creatinine 1.88.  5.  Frequent PVCs.  Asymptomatic at this time.  Continues on atenolol. LVEF 55 to 60% by last assessment.  Medication Adjustments/Labs and Tests Ordered: Current medicines are reviewed at length with the patient today.  Concerns regarding medicines are outlined above.   Tests Ordered: Orders Placed This Encounter  Procedures  . EKG 12-Lead    Medication Changes: Meds ordered this encounter  Medications  . nitroGLYCERIN (NITROSTAT) 0.4 MG SL tablet    Sig: Place 1 tablet (0.4 mg total) under the tongue every 5 (five) minutes x 3 doses as needed for chest pain (if no relief after 3rd dose, proceed to the ED for an evaluation or call 911). PLACE ONE TABLET UNDER THE TONGUE EVERY 5 MINUTES AS NEEDED FOR CHEST PAIN    Dispense:  25 tablet    Refill:  3    Disposition:  Follow up 3 months in the [place] office.  Signed, Satira Sark, MD, [facility] 03/18/2020 9:59 AM    [facility] at [place], [place], [place] [address] Phone: [phone]; Fax: [phone]

## 2020-03-18 NOTE — Patient Instructions (Addendum)
Medication Instructions:   Your physician recommends that you continue on your current medications as directed. Please refer to the Current Medication list given to you today.  Labwork:  NONE  Testing/Procedures:  NONE  Follow-Up:  Your physician recommends that you schedule a follow-up appointment in: 3 months.  Any Other Special Instructions Will Be Listed Below (If Applicable).  If you need a refill on your cardiac medications before your next appointment, please call your pharmacy. 

## 2020-03-23 DIAGNOSIS — R42 Dizziness and giddiness: Secondary | ICD-10-CM | POA: Diagnosis not present

## 2020-03-23 DIAGNOSIS — R269 Unspecified abnormalities of gait and mobility: Secondary | ICD-10-CM | POA: Diagnosis not present

## 2020-03-24 DIAGNOSIS — R269 Unspecified abnormalities of gait and mobility: Secondary | ICD-10-CM | POA: Diagnosis not present

## 2020-03-24 DIAGNOSIS — R42 Dizziness and giddiness: Secondary | ICD-10-CM | POA: Diagnosis not present

## 2020-03-26 DIAGNOSIS — R269 Unspecified abnormalities of gait and mobility: Secondary | ICD-10-CM | POA: Diagnosis not present

## 2020-03-26 DIAGNOSIS — R42 Dizziness and giddiness: Secondary | ICD-10-CM | POA: Diagnosis not present

## 2020-03-31 DIAGNOSIS — R269 Unspecified abnormalities of gait and mobility: Secondary | ICD-10-CM | POA: Diagnosis not present

## 2020-03-31 DIAGNOSIS — R42 Dizziness and giddiness: Secondary | ICD-10-CM | POA: Diagnosis not present

## 2020-04-02 ENCOUNTER — Ambulatory Visit (INDEPENDENT_AMBULATORY_CARE_PROVIDER_SITE_OTHER): Payer: Medicare Other | Admitting: *Deleted

## 2020-04-02 DIAGNOSIS — R269 Unspecified abnormalities of gait and mobility: Secondary | ICD-10-CM | POA: Diagnosis not present

## 2020-04-02 DIAGNOSIS — I495 Sick sinus syndrome: Secondary | ICD-10-CM | POA: Diagnosis not present

## 2020-04-02 DIAGNOSIS — R42 Dizziness and giddiness: Secondary | ICD-10-CM | POA: Diagnosis not present

## 2020-04-02 LAB — CUP PACEART REMOTE DEVICE CHECK
Battery Remaining Longevity: 104 mo
Battery Remaining Percentage: 95.5 %
Battery Voltage: 3.02 V
Brady Statistic RV Percent Paced: 35 %
Date Time Interrogation Session: 20210819020020
Implantable Lead Implant Date: 20210517
Implantable Lead Location: 753860
Implantable Pulse Generator Implant Date: 20210517
Lead Channel Impedance Value: 550 Ohm
Lead Channel Pacing Threshold Amplitude: 0.75 V
Lead Channel Pacing Threshold Pulse Width: 0.5 ms
Lead Channel Sensing Intrinsic Amplitude: 4.2 mV
Lead Channel Setting Pacing Amplitude: 3.5 V
Lead Channel Setting Pacing Pulse Width: 0.5 ms
Lead Channel Setting Sensing Sensitivity: 0.7 mV
Pulse Gen Model: 1272
Pulse Gen Serial Number: 3800831

## 2020-04-06 DIAGNOSIS — R42 Dizziness and giddiness: Secondary | ICD-10-CM | POA: Diagnosis not present

## 2020-04-06 DIAGNOSIS — R269 Unspecified abnormalities of gait and mobility: Secondary | ICD-10-CM | POA: Diagnosis not present

## 2020-04-06 NOTE — Progress Notes (Signed)
Remote pacemaker transmission.   

## 2020-04-07 ENCOUNTER — Other Ambulatory Visit: Payer: Self-pay

## 2020-04-07 ENCOUNTER — Ambulatory Visit: Payer: Medicare Other | Admitting: Internal Medicine

## 2020-04-07 VITALS — BP 108/56 | HR 67 | Ht 72.0 in | Wt 171.4 lb

## 2020-04-07 DIAGNOSIS — I4821 Permanent atrial fibrillation: Secondary | ICD-10-CM

## 2020-04-07 DIAGNOSIS — Z950 Presence of cardiac pacemaker: Secondary | ICD-10-CM

## 2020-04-07 DIAGNOSIS — R42 Dizziness and giddiness: Secondary | ICD-10-CM

## 2020-04-07 DIAGNOSIS — I495 Sick sinus syndrome: Secondary | ICD-10-CM | POA: Diagnosis not present

## 2020-04-07 MED ORDER — ATENOLOL 50 MG PO TABS: 50.0000 mg | ORAL_TABLET | Freq: Every day | ORAL | 6 refills | Status: DC

## 2020-04-07 NOTE — Progress Notes (Signed)
Patient Care Team: Sasser, Silvestre Moment, MD as PCP - General (Cardiology) Satira Sark, MD as PCP - Cardiology (Cardiology)   HPI  Benjamin Mason is a 86 y.o. male Seen in followup for permanent atrial fib, PVCs  Dizzy spell while visiting his wife in hospital >An event recorder>> 4-8 sec pauses>> pacing 5/21  No interval syncope; some dizziness ? Positional  No sob or chest pain. DATE TEST EF   4/18 LHC    % CAD Nonobstructive  11/20 Echo   55-60 % BAE- mod  12/20 MYOVIEW 45-54%    Date Cr K Hgb  10/17 1.72 4.6    4/21 2.12 4.0 12  5/21 1.88 4.4 10.7  MCV>105 x 8+ years--B12 folate normal (scanned)  2013   Date PVCs  11/20 30.3%          Records and Results Reviewed   Past Medical History:  Diagnosis Date  . Anxiety   . Aortic regurgitation    Mild to moderate  . CKD (chronic kidney disease) stage 3, GFR 30-59 ml/min   . Coronary atherosclerosis of native coronary artery    BMS to RCA and LAD 1995  . Essential hypertension   . Frequent PVCs   . IBS (irritable bowel syndrome)   . Lactose intolerance   . Mitral regurgitation    Moderate  . Mixed hyperlipidemia   . Permanent atrial fibrillation ([place])   . Sick sinus syndrome [facility])    St. Jude single-chamber pacemaker May 2021 - Dr. Caryl Comes  . Skin cancer     Past Surgical History:  Procedure Laterality Date  . BALLOON DILATION  08/03/2012   Procedure: BALLOON DILATION;  Surgeon: Rogene Houston, MD;  [facility];  Service: Endoscopy;  Laterality: N/A;  . COLONOSCOPY WITH ESOPHAGOGASTRODUODENOSCOPY (EGD)  08/03/2012   Procedure: COLONOSCOPY WITH ESOPHAGOGASTRODUODENOSCOPY (EGD);  Surgeon: Rogene Houston, MD;  [facility];  Service: Endoscopy;  Laterality: N/A;  215  . LEFT HEART CATH AND CORONARY ANGIOGRAPHY N/A 11/18/2016   Procedure: Left Heart Cath and Coronary Angiography;  Surgeon: Peter M Martinique, MD;  Location: [place] CV LAB;  Service: Cardiovascular;   Laterality: N/A;  . LEFT INGUINAL HERNIA    . MALONEY DILATION  08/03/2012   Procedure: MALONEY DILATION;  Surgeon: Rogene Houston, MD;  [facility];  Service: Endoscopy;  Laterality: N/A;  . PACEMAKER IMPLANT N/A 12/30/2019   Procedure: PACEMAKER IMPLANT;  Surgeon: Deboraha Sprang, MD;  Location: [place] CV LAB;  Service: Cardiovascular;  Laterality: N/A;  . PENILE PROSTHESIS IMPLANT    . SAVORY DILATION  08/03/2012   Procedure: SAVORY DILATION;  Surgeon: Rogene Houston, MD;  [facility];  Service: Endoscopy;  Laterality: N/A;    Current Meds  Medication Sig  . acetaminophen (TYLENOL ARTHRITIS PAIN) 650 MG CR tablet Take 1,300 mg by mouth 2 (two) times daily.   Marland Kitchen apixaban (ELIQUIS) 2.5 MG TABS tablet Take 1 tablet (2.5 mg total) by mouth 2 (two) times daily.  Marland Kitchen Apoaequorin (PREVAGEN) 10 MG CAPS Take 10 mg by mouth daily.   Marland Kitchen atenolol (TENORMIN) 50 MG tablet Take 1 tablet (50 mg total) by mouth daily.  . brimonidine (ALPHAGAN) 0.15 % ophthalmic solution Place 1 drop into the right eye 2 (two) times daily.  . cholecalciferol (VITAMIN D3) 25 MCG (1000 UT) tablet Take 1,000 Units by mouth daily.  . dorzolamide-timolol (COSOPT) 22.3-6.8 MG/ML ophthalmic  solution 1 drop 2 (two) times daily.  . finasteride (PROSCAR) 5 MG tablet Take 5 mg by mouth daily.    . Lactase 9000 units CHEW Chew 1 tablet by mouth 3 (three) times daily as needed (when eating dairy products).   . melatonin 3 MG TABS tablet Take 9 mg by mouth at bedtime.  . Multiple Vitamin (MULTIVITAMIN WITH MINERALS) TABS tablet Take 1 tablet by mouth daily.  . nitroGLYCERIN (NITROSTAT) 0.4 MG SL tablet Place 1 tablet (0.4 mg total) under the tongue every 5 (five) minutes x 3 doses as needed for chest pain (if no relief after 3rd dose, proceed to the ED for an evaluation or call 911). PLACE ONE TABLET UNDER THE TONGUE EVERY 5 MINUTES AS NEEDED FOR CHEST PAIN  . pantoprazole (PROTONIX) 40 MG tablet Take 40 mg by  mouth daily.   . prednisoLONE acetate (PRED FORTE) 1 % ophthalmic suspension Place 1 drop into the right eye at bedtime.  . simvastatin (ZOCOR) 20 MG tablet Take 10 mg by mouth at bedtime.  . Tamsulosin HCl (FLOMAX) 0.4 MG CAPS Take 0.4 mg by mouth at bedtime.   Marland Kitchen testosterone cypionate (DEPOTESTOSTERONE CYPIONATE) 200 MG/ML injection Inject 140 mg into the muscle every 14 (fourteen) days.  Marland Kitchen triamcinolone cream (KENALOG) 0.1 % Apply 1 application topically daily as needed (wound care).   . [DISCONTINUED] atenolol (TENORMIN) 50 MG tablet Take 1 tablet (50 mg total) by mouth 2 (two) times daily.    No Known Allergies    Review of Systems negative except from HPI and PMH  Physical Exam BP (!) 108/56   Pulse 67   Ht 6' (1.829 m)   Wt 171 lb 6.4 oz (77.7 kg)   SpO2 95%   BMI 23.25 kg/m  Well developed and well nourished in no acute distress HENT normal E scleral and icterus clear Neck Supple JVP flat; carotids brisk and full Clear to ausculation Device pocket well healed; without hematoma or erythema.  There is no tethering Regular rate and rhythm, no murmurs gallops or rub Soft with active bowel sounds No clubbing cyanosis  Edema Alert and oriented, grossly normal motor and sensory function Skin Warm and Dry  ECG sinus   CrCl cannot be calculated (Patient's most recent lab result is older than the maximum 21 days allowed.).   Assessment and  Plan  PVCs  Afib permanent   CHF chronic diastolic  Pause 4-8 sec  ( cant tell if intermediate signal is real or not) in the setting ofabrupt bradycardia to 20 bpm  ? Cardiomyopathy  Renal insufficiency grade 3-4   Dementia   Pacemaker    Euvolemic continue current meds  Orthostatics neg  Will check CBC has had prog anemia  Needs followup with renal function       Current medicines are reviewed at length with the patient today .  The patient does not  have concerns regarding medicines.

## 2020-04-07 NOTE — Patient Instructions (Addendum)
Medication Instructions:  Your physician has recommended you make the following change in your medication:   ** Decrease your Atenolol 50mg  to once daily by mouth.   *If you need a refill on your cardiac medications before your next appointment, please call your pharmacy*   Lab Work:  CBC and Ferritin today   If you have labs (blood work) drawn today and your tests are completely normal, you will receive your results only by: Marland Kitchen MyChart Message (if you have MyChart) OR . A paper copy in the mail If you have any lab test that is abnormal or we need to change your treatment, we will call you to review the results.   Testing/Procedures: None ordered.    Follow-Up: At [facility], you and your health needs are our priority.  As part of our continuing mission to provide you with exceptional heart care, we have created designated Provider Care Teams.  These Care Teams include your primary Cardiologist (physician) and Advanced Practice Providers (APPs -  Physician Assistants and Nurse Practitioners) who all work together to provide you with the care you need, when you need it.  We recommend signing up for the patient portal called "MyChart".  Sign up information is provided on this After Visit Summary.  MyChart is used to connect with patients for Virtual Visits (Telemedicine).  Patients are able to view lab/test results, encounter notes, upcoming appointments, etc.  Non-urgent messages can be sent to your provider as well.   To learn more about what you can do with MyChart, go to [email or web address].    Your next appointment:   3 month virtual visit  Provider:   Virl Axe, MD

## 2020-04-08 DIAGNOSIS — D539 Nutritional anemia, unspecified: Secondary | ICD-10-CM | POA: Diagnosis not present

## 2020-04-08 DIAGNOSIS — D638 Anemia in other chronic diseases classified elsewhere: Secondary | ICD-10-CM | POA: Diagnosis not present

## 2020-04-08 DIAGNOSIS — R42 Dizziness and giddiness: Secondary | ICD-10-CM | POA: Diagnosis not present

## 2020-04-08 DIAGNOSIS — R5383 Other fatigue: Secondary | ICD-10-CM | POA: Diagnosis not present

## 2020-04-08 LAB — CBC
Hematocrit: 32 % — ABNORMAL LOW (ref 37.5–51.0)
Hemoglobin: 10.7 g/dL — ABNORMAL LOW (ref 13.0–17.7)
MCH: 35.5 pg — ABNORMAL HIGH (ref 26.6–33.0)
MCHC: 33.4 g/dL (ref 31.5–35.7)
MCV: 106 fL — ABNORMAL HIGH (ref 79–97)
Platelets: 146 10*3/uL — ABNORMAL LOW (ref 150–450)
RBC: 3.01 x10E6/uL — ABNORMAL LOW (ref 4.14–5.80)
RDW: 11.8 % (ref 11.6–15.4)
WBC: 4.5 10*3/uL (ref 3.4–10.8)

## 2020-04-08 LAB — FERRITIN: Ferritin: 827 ng/mL — ABNORMAL HIGH (ref 30–400)

## 2020-04-09 DIAGNOSIS — R42 Dizziness and giddiness: Secondary | ICD-10-CM | POA: Diagnosis not present

## 2020-04-09 DIAGNOSIS — R269 Unspecified abnormalities of gait and mobility: Secondary | ICD-10-CM | POA: Diagnosis not present

## 2020-04-10 DIAGNOSIS — H353132 Nonexudative age-related macular degeneration, bilateral, intermediate dry stage: Secondary | ICD-10-CM | POA: Diagnosis not present

## 2020-04-10 DIAGNOSIS — T8522XD Displacement of intraocular lens, subsequent encounter: Secondary | ICD-10-CM | POA: Diagnosis not present

## 2020-04-10 DIAGNOSIS — H35372 Puckering of macula, left eye: Secondary | ICD-10-CM | POA: Diagnosis not present

## 2020-04-10 DIAGNOSIS — H43813 Vitreous degeneration, bilateral: Secondary | ICD-10-CM | POA: Diagnosis not present

## 2020-04-10 LAB — CUP PACEART INCLINIC DEVICE CHECK
Battery Remaining Longevity: 133 mo
Battery Voltage: 3.02 V
Brady Statistic RV Percent Paced: 35 %
Date Time Interrogation Session: 20210824155500
Implantable Lead Implant Date: 20210517
Implantable Lead Location: 753860
Implantable Pulse Generator Implant Date: 20210517
Lead Channel Impedance Value: 562.5 Ohm
Lead Channel Pacing Threshold Amplitude: 0.5 V
Lead Channel Pacing Threshold Pulse Width: 0.5 ms
Lead Channel Sensing Intrinsic Amplitude: 3.7 mV
Lead Channel Setting Pacing Amplitude: 2.5 V
Lead Channel Setting Pacing Pulse Width: 0.5 ms
Lead Channel Setting Sensing Sensitivity: 0.7 mV
Pulse Gen Model: 1272
Pulse Gen Serial Number: 3800831

## 2020-04-14 DIAGNOSIS — H40051 Ocular hypertension, right eye: Secondary | ICD-10-CM | POA: Diagnosis not present

## 2020-04-14 DIAGNOSIS — R42 Dizziness and giddiness: Secondary | ICD-10-CM | POA: Diagnosis not present

## 2020-04-14 DIAGNOSIS — R269 Unspecified abnormalities of gait and mobility: Secondary | ICD-10-CM | POA: Diagnosis not present

## 2020-04-16 DIAGNOSIS — R269 Unspecified abnormalities of gait and mobility: Secondary | ICD-10-CM | POA: Diagnosis not present

## 2020-04-16 DIAGNOSIS — R42 Dizziness and giddiness: Secondary | ICD-10-CM | POA: Diagnosis not present

## 2020-04-24 DIAGNOSIS — H43813 Vitreous degeneration, bilateral: Secondary | ICD-10-CM | POA: Diagnosis not present

## 2020-04-24 DIAGNOSIS — T8522XA Displacement of intraocular lens, initial encounter: Secondary | ICD-10-CM | POA: Diagnosis not present

## 2020-04-24 DIAGNOSIS — H40051 Ocular hypertension, right eye: Secondary | ICD-10-CM | POA: Diagnosis not present

## 2020-04-24 DIAGNOSIS — H2101 Hyphema, right eye: Secondary | ICD-10-CM | POA: Diagnosis not present

## 2020-04-29 ENCOUNTER — Telehealth: Payer: Self-pay | Admitting: Cardiology

## 2020-04-29 DIAGNOSIS — R2689 Other abnormalities of gait and mobility: Secondary | ICD-10-CM | POA: Diagnosis not present

## 2020-04-29 DIAGNOSIS — M47816 Spondylosis without myelopathy or radiculopathy, lumbar region: Secondary | ICD-10-CM | POA: Diagnosis not present

## 2020-04-29 DIAGNOSIS — M9903 Segmental and somatic dysfunction of lumbar region: Secondary | ICD-10-CM | POA: Diagnosis not present

## 2020-04-29 DIAGNOSIS — M545 Low back pain: Secondary | ICD-10-CM | POA: Diagnosis not present

## 2020-04-29 NOTE — Telephone Encounter (Signed)
   Primary Cardiologist: Rozann Lesches, MD  Chart reviewed as part of pre-operative protocol coverage. Patient was contacted 04/29/2020 in reference to pre-operative risk assessment for pending surgery as outlined below.  Benjamin Mason was last seen on 03/18/20 by Dr. Domenic Polite.  Since that day, [name] has done well from a cardiac standpoint. No new issues or complaints.  Therefore, based on ACC/AHA guidelines, the patient would be at acceptable risk for the planned procedure without further cardiovascular testing.   The patient was advised that if he develops new symptoms prior to surgery to contact our office to arrange for a follow-up visit, and he verbalized understanding.  Per Pharmacy:   Date of procedure: 05/13/20  CHADS2-VASc score of 5 (age x2, CHF, HTN, CAD)  CrCl 70mL/min Platelet count 165K  Per office protocol, patient can hold Eliquis for 3 days prior to procedure as requested.    I will route this recommendation to the requesting party via Epic fax function and remove from pre-op pool. Please call with questions.  Reino Bellis, NP 04/29/2020, 4:51 PM

## 2020-04-29 NOTE — Telephone Encounter (Signed)
Patient with diagnosis of afib on Eliquis for anticoagulation.    Procedure: vitrectomy and IOL exchange of the right eye Date of procedure: 05/13/20  CHADS2-VASc score of 5 (age x2, CHF, HTN, CAD)  CrCl 50mL/min Platelet count 165K  Per office protocol, patient can hold Eliquis for 3 days prior to procedure as requested.

## 2020-04-29 NOTE — Telephone Encounter (Signed)
   [facility] Pre-operative Risk Assessment    HEARTCARE STAFF: - Please ensure there is not already an duplicate clearance open for this procedure. - Under Visit Info/Reason for Call, type in Other and utilize the format Clearance MM/DD/YY or Clearance TBD. Do not use dashes or single digits. - If request is for dental extraction, please clarify the # of teeth to be extracted.  Request for surgical clearance:  1. What type of surgery is being performed? Vitrectomy and IOL exchange of the right eye    2. When is this surgery scheduled? 05/13/2020  3. What type of clearance is required (medical clearance vs. Pharmacy clearance to hold med vs. Both)?   4. Are there any medications that need to be held prior to surgery and how long? Eliquis for 3 days prior to surgery  5.    6. Practice name and name of physician performing surgery? [facility]   7. What is the office phone number? [phone]   7.   What is the office fax number? [phone]  8.   Anesthesia type (None, local, MAC, general) ? None noted    Benjamin Mason 04/29/2020, 7:37 AM  _________________________________________________________________   (provider comments below)

## 2020-05-01 DIAGNOSIS — M9903 Segmental and somatic dysfunction of lumbar region: Secondary | ICD-10-CM | POA: Diagnosis not present

## 2020-05-01 DIAGNOSIS — M47816 Spondylosis without myelopathy or radiculopathy, lumbar region: Secondary | ICD-10-CM | POA: Diagnosis not present

## 2020-05-01 DIAGNOSIS — M545 Low back pain: Secondary | ICD-10-CM | POA: Diagnosis not present

## 2020-05-01 DIAGNOSIS — R2689 Other abnormalities of gait and mobility: Secondary | ICD-10-CM | POA: Diagnosis not present

## 2020-05-13 DIAGNOSIS — H33321 Round hole, right eye: Secondary | ICD-10-CM | POA: Diagnosis not present

## 2020-05-13 DIAGNOSIS — H4051X4 Glaucoma secondary to other eye disorders, right eye, indeterminate stage: Secondary | ICD-10-CM | POA: Diagnosis not present

## 2020-05-13 DIAGNOSIS — H2101 Hyphema, right eye: Secondary | ICD-10-CM | POA: Diagnosis not present

## 2020-05-13 DIAGNOSIS — T8522XA Displacement of intraocular lens, initial encounter: Secondary | ICD-10-CM | POA: Diagnosis not present

## 2020-05-14 DIAGNOSIS — N183 Chronic kidney disease, stage 3 unspecified: Secondary | ICD-10-CM | POA: Diagnosis not present

## 2020-05-14 DIAGNOSIS — I482 Chronic atrial fibrillation, unspecified: Secondary | ICD-10-CM | POA: Diagnosis not present

## 2020-05-14 DIAGNOSIS — E7849 Other hyperlipidemia: Secondary | ICD-10-CM | POA: Diagnosis not present

## 2020-05-14 DIAGNOSIS — I129 Hypertensive chronic kidney disease with stage 1 through stage 4 chronic kidney disease, or unspecified chronic kidney disease: Secondary | ICD-10-CM | POA: Diagnosis not present

## 2020-05-19 ENCOUNTER — Telehealth: Payer: Self-pay | Admitting: Cardiology

## 2020-05-19 DIAGNOSIS — H31301 Unspecified choroidal hemorrhage, right eye: Secondary | ICD-10-CM | POA: Diagnosis not present

## 2020-05-19 NOTE — Telephone Encounter (Signed)
KIm -daughter called stating that Mr. Holley is having a hemorrhage from his eye surgery. He was seen today by his Ophthalmologist. She states that he is going to start an eye drop. Daughter is wanting to know if Mr. Vickers can stop the Eliquis for a few days to see if this will help with the bleeding.  Please call [phone].

## 2020-05-19 NOTE — Telephone Encounter (Signed)
Advised daughter that any questions pertaining to holding eliquis would need to be communicated directly from the office doing the procedure. Verbalized understanding.

## 2020-05-21 DIAGNOSIS — H4311 Vitreous hemorrhage, right eye: Secondary | ICD-10-CM | POA: Diagnosis not present

## 2020-05-21 DIAGNOSIS — H31301 Unspecified choroidal hemorrhage, right eye: Secondary | ICD-10-CM | POA: Diagnosis not present

## 2020-05-22 ENCOUNTER — Telehealth: Payer: Self-pay | Admitting: Cardiology

## 2020-05-22 NOTE — Telephone Encounter (Signed)
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

## 2020-05-22 NOTE — Telephone Encounter (Signed)
Call came from [place], I will forward to [place]

## 2020-05-22 NOTE — Telephone Encounter (Signed)
Thank you for clarifying.  Yes, that sounds reasonable.

## 2020-05-22 NOTE — Telephone Encounter (Signed)
I spoke with Benjamin Mason and they will hold eliquis until re-check on Monday, 10/11.Dr.McDowell agrees.

## 2020-05-22 NOTE — Telephone Encounter (Signed)
New message    Patient had retina surgery and he held eliquis for 3 days , upon resuming patient is still having bleeding , can he hold the medication longer ?

## 2020-05-22 NOTE — Telephone Encounter (Signed)
Somewhat of an open question.  Certainly, if he is bleeding he can hold Eliquis further.  Would clarify with his ophthalmologist how long they would like the Eliquis to be held, and is he being reevaluated for cessation of bleeding, etc.

## 2020-05-25 DIAGNOSIS — H31301 Unspecified choroidal hemorrhage, right eye: Secondary | ICD-10-CM | POA: Diagnosis not present

## 2020-05-26 ENCOUNTER — Telehealth: Payer: Self-pay | Admitting: Cardiology

## 2020-05-26 NOTE — Telephone Encounter (Signed)
New message    Patient had surgery and you cleared him to hold his apixaban (ELIQUIS) 2.5 MG TABS tablet, after his procedure he went back on it and was having bleeding, they were told to cut the eliquis back to one tablet once a day, [facility] is requesting that the patient continue on the once a day tablet regimen for another week if possible,instead of 2x a day.  Please call to advise

## 2020-05-26 NOTE — Telephone Encounter (Signed)
If he is still having bleeding problems, Eliquis can be held one more week.  Taking the dose once a day is really not providing him any stroke prophylaxis.

## 2020-05-26 NOTE — Telephone Encounter (Signed)
Museum/gallery conservator at [place] notified and voiced understanding.

## 2020-06-05 DIAGNOSIS — K2100 Gastro-esophageal reflux disease with esophagitis, without bleeding: Secondary | ICD-10-CM | POA: Diagnosis not present

## 2020-06-05 DIAGNOSIS — E875 Hyperkalemia: Secondary | ICD-10-CM | POA: Diagnosis not present

## 2020-06-05 DIAGNOSIS — E7800 Pure hypercholesterolemia, unspecified: Secondary | ICD-10-CM | POA: Diagnosis not present

## 2020-06-05 DIAGNOSIS — R5383 Other fatigue: Secondary | ICD-10-CM | POA: Diagnosis not present

## 2020-06-05 DIAGNOSIS — E782 Mixed hyperlipidemia: Secondary | ICD-10-CM | POA: Diagnosis not present

## 2020-06-09 DIAGNOSIS — W19XXXA Unspecified fall, initial encounter: Secondary | ICD-10-CM | POA: Diagnosis not present

## 2020-06-09 DIAGNOSIS — S72144A Nondisplaced intertrochanteric fracture of right femur, initial encounter for closed fracture: Secondary | ICD-10-CM | POA: Diagnosis not present

## 2020-06-09 DIAGNOSIS — M25551 Pain in right hip: Secondary | ICD-10-CM | POA: Diagnosis not present

## 2020-06-09 DIAGNOSIS — I4891 Unspecified atrial fibrillation: Secondary | ICD-10-CM | POA: Diagnosis not present

## 2020-06-09 DIAGNOSIS — Z950 Presence of cardiac pacemaker: Secondary | ICD-10-CM | POA: Diagnosis not present

## 2020-06-09 DIAGNOSIS — R52 Pain, unspecified: Secondary | ICD-10-CM | POA: Diagnosis not present

## 2020-06-09 DIAGNOSIS — Z743 Need for continuous supervision: Secondary | ICD-10-CM | POA: Diagnosis not present

## 2020-06-09 DIAGNOSIS — S72141A Displaced intertrochanteric fracture of right femur, initial encounter for closed fracture: Secondary | ICD-10-CM | POA: Diagnosis not present

## 2020-06-10 DIAGNOSIS — Z01810 Encounter for preprocedural cardiovascular examination: Secondary | ICD-10-CM | POA: Diagnosis not present

## 2020-06-10 DIAGNOSIS — I351 Nonrheumatic aortic (valve) insufficiency: Secondary | ICD-10-CM | POA: Insufficient documentation

## 2020-06-10 DIAGNOSIS — I129 Hypertensive chronic kidney disease with stage 1 through stage 4 chronic kidney disease, or unspecified chronic kidney disease: Secondary | ICD-10-CM | POA: Diagnosis not present

## 2020-06-10 DIAGNOSIS — Z8249 Family history of ischemic heart disease and other diseases of the circulatory system: Secondary | ICD-10-CM | POA: Diagnosis not present

## 2020-06-10 DIAGNOSIS — R262 Difficulty in walking, not elsewhere classified: Secondary | ICD-10-CM | POA: Diagnosis not present

## 2020-06-10 DIAGNOSIS — Z8679 Personal history of other diseases of the circulatory system: Secondary | ICD-10-CM | POA: Diagnosis not present

## 2020-06-10 DIAGNOSIS — J8410 Pulmonary fibrosis, unspecified: Secondary | ICD-10-CM | POA: Diagnosis not present

## 2020-06-10 DIAGNOSIS — S72001A Fracture of unspecified part of neck of right femur, initial encounter for closed fracture: Secondary | ICD-10-CM | POA: Diagnosis not present

## 2020-06-10 DIAGNOSIS — I4821 Permanent atrial fibrillation: Secondary | ICD-10-CM | POA: Diagnosis not present

## 2020-06-10 DIAGNOSIS — Z961 Presence of intraocular lens: Secondary | ICD-10-CM | POA: Insufficient documentation

## 2020-06-10 DIAGNOSIS — I10 Essential (primary) hypertension: Secondary | ICD-10-CM | POA: Diagnosis not present

## 2020-06-10 DIAGNOSIS — Z79899 Other long term (current) drug therapy: Secondary | ICD-10-CM | POA: Diagnosis not present

## 2020-06-10 DIAGNOSIS — Z96641 Presence of right artificial hip joint: Secondary | ICD-10-CM | POA: Diagnosis not present

## 2020-06-10 DIAGNOSIS — I340 Nonrheumatic mitral (valve) insufficiency: Secondary | ICD-10-CM | POA: Diagnosis not present

## 2020-06-10 DIAGNOSIS — M6281 Muscle weakness (generalized): Secondary | ICD-10-CM | POA: Diagnosis not present

## 2020-06-10 DIAGNOSIS — Z4781 Encounter for orthopedic aftercare following surgical amputation: Secondary | ICD-10-CM | POA: Diagnosis not present

## 2020-06-10 DIAGNOSIS — D631 Anemia in chronic kidney disease: Secondary | ICD-10-CM | POA: Diagnosis not present

## 2020-06-10 DIAGNOSIS — I2510 Atherosclerotic heart disease of native coronary artery without angina pectoris: Secondary | ICD-10-CM | POA: Diagnosis not present

## 2020-06-10 DIAGNOSIS — E782 Mixed hyperlipidemia: Secondary | ICD-10-CM | POA: Diagnosis not present

## 2020-06-10 DIAGNOSIS — I493 Ventricular premature depolarization: Secondary | ICD-10-CM | POA: Diagnosis not present

## 2020-06-10 DIAGNOSIS — I495 Sick sinus syndrome: Secondary | ICD-10-CM | POA: Diagnosis not present

## 2020-06-10 DIAGNOSIS — Z950 Presence of cardiac pacemaker: Secondary | ICD-10-CM | POA: Diagnosis not present

## 2020-06-10 DIAGNOSIS — S72144A Nondisplaced intertrochanteric fracture of right femur, initial encounter for closed fracture: Secondary | ICD-10-CM | POA: Diagnosis not present

## 2020-06-10 DIAGNOSIS — H4051X3 Glaucoma secondary to other eye disorders, right eye, severe stage: Secondary | ICD-10-CM | POA: Diagnosis not present

## 2020-06-10 DIAGNOSIS — K5900 Constipation, unspecified: Secondary | ICD-10-CM | POA: Diagnosis not present

## 2020-06-10 DIAGNOSIS — I38 Endocarditis, valve unspecified: Secondary | ICD-10-CM | POA: Diagnosis not present

## 2020-06-10 DIAGNOSIS — Z743 Need for continuous supervision: Secondary | ICD-10-CM | POA: Diagnosis not present

## 2020-06-10 DIAGNOSIS — S72141A Displaced intertrochanteric fracture of right femur, initial encounter for closed fracture: Secondary | ICD-10-CM | POA: Diagnosis not present

## 2020-06-10 DIAGNOSIS — N183 Chronic kidney disease, stage 3 unspecified: Secondary | ICD-10-CM | POA: Diagnosis not present

## 2020-06-10 DIAGNOSIS — T1590XA Foreign body on external eye, part unspecified, unspecified eye, initial encounter: Secondary | ICD-10-CM | POA: Diagnosis not present

## 2020-06-10 DIAGNOSIS — Z9841 Cataract extraction status, right eye: Secondary | ICD-10-CM | POA: Diagnosis not present

## 2020-06-10 DIAGNOSIS — H2101 Hyphema, right eye: Secondary | ICD-10-CM | POA: Diagnosis not present

## 2020-06-10 DIAGNOSIS — S72141D Displaced intertrochanteric fracture of right femur, subsequent encounter for closed fracture with routine healing: Secondary | ICD-10-CM | POA: Diagnosis not present

## 2020-06-10 DIAGNOSIS — K219 Gastro-esophageal reflux disease without esophagitis: Secondary | ICD-10-CM | POA: Diagnosis not present

## 2020-06-10 DIAGNOSIS — H5789 Other specified disorders of eye and adnexa: Secondary | ICD-10-CM | POA: Diagnosis not present

## 2020-06-10 DIAGNOSIS — H4051X0 Glaucoma secondary to other eye disorders, right eye, stage unspecified: Secondary | ICD-10-CM | POA: Diagnosis not present

## 2020-06-10 DIAGNOSIS — Z955 Presence of coronary angioplasty implant and graft: Secondary | ICD-10-CM | POA: Diagnosis not present

## 2020-06-10 DIAGNOSIS — H4311 Vitreous hemorrhage, right eye: Secondary | ICD-10-CM | POA: Diagnosis not present

## 2020-06-10 DIAGNOSIS — K2289 Other specified disease of esophagus: Secondary | ICD-10-CM | POA: Diagnosis not present

## 2020-06-10 DIAGNOSIS — Z7901 Long term (current) use of anticoagulants: Secondary | ICD-10-CM | POA: Diagnosis not present

## 2020-06-10 DIAGNOSIS — E785 Hyperlipidemia, unspecified: Secondary | ICD-10-CM | POA: Diagnosis not present

## 2020-06-10 DIAGNOSIS — K449 Diaphragmatic hernia without obstruction or gangrene: Secondary | ICD-10-CM | POA: Diagnosis not present

## 2020-06-10 DIAGNOSIS — Z471 Aftercare following joint replacement surgery: Secondary | ICD-10-CM | POA: Diagnosis not present

## 2020-06-10 DIAGNOSIS — R29898 Other symptoms and signs involving the musculoskeletal system: Secondary | ICD-10-CM | POA: Diagnosis not present

## 2020-06-10 DIAGNOSIS — N1831 Chronic kidney disease, stage 3a: Secondary | ICD-10-CM | POA: Diagnosis not present

## 2020-06-10 DIAGNOSIS — I4891 Unspecified atrial fibrillation: Secondary | ICD-10-CM | POA: Diagnosis not present

## 2020-06-14 DIAGNOSIS — H4311 Vitreous hemorrhage, right eye: Secondary | ICD-10-CM | POA: Diagnosis not present

## 2020-06-14 DIAGNOSIS — N1831 Chronic kidney disease, stage 3a: Secondary | ICD-10-CM | POA: Diagnosis not present

## 2020-06-14 DIAGNOSIS — I351 Nonrheumatic aortic (valve) insufficiency: Secondary | ICD-10-CM | POA: Diagnosis not present

## 2020-06-14 DIAGNOSIS — H4051X3 Glaucoma secondary to other eye disorders, right eye, severe stage: Secondary | ICD-10-CM | POA: Diagnosis not present

## 2020-06-14 DIAGNOSIS — Z79899 Other long term (current) drug therapy: Secondary | ICD-10-CM | POA: Diagnosis not present

## 2020-06-14 DIAGNOSIS — Z950 Presence of cardiac pacemaker: Secondary | ICD-10-CM | POA: Diagnosis not present

## 2020-06-14 DIAGNOSIS — I2510 Atherosclerotic heart disease of native coronary artery without angina pectoris: Secondary | ICD-10-CM | POA: Diagnosis not present

## 2020-06-14 DIAGNOSIS — K219 Gastro-esophageal reflux disease without esophagitis: Secondary | ICD-10-CM | POA: Diagnosis not present

## 2020-06-14 DIAGNOSIS — I495 Sick sinus syndrome: Secondary | ICD-10-CM | POA: Diagnosis not present

## 2020-06-14 DIAGNOSIS — E782 Mixed hyperlipidemia: Secondary | ICD-10-CM | POA: Diagnosis not present

## 2020-06-14 DIAGNOSIS — Z961 Presence of intraocular lens: Secondary | ICD-10-CM | POA: Diagnosis not present

## 2020-06-14 DIAGNOSIS — H2101 Hyphema, right eye: Secondary | ICD-10-CM | POA: Diagnosis not present

## 2020-06-14 DIAGNOSIS — Z955 Presence of coronary angioplasty implant and graft: Secondary | ICD-10-CM | POA: Diagnosis not present

## 2020-06-14 DIAGNOSIS — I493 Ventricular premature depolarization: Secondary | ICD-10-CM | POA: Diagnosis not present

## 2020-06-14 DIAGNOSIS — I38 Endocarditis, valve unspecified: Secondary | ICD-10-CM | POA: Diagnosis not present

## 2020-06-14 DIAGNOSIS — I129 Hypertensive chronic kidney disease with stage 1 through stage 4 chronic kidney disease, or unspecified chronic kidney disease: Secondary | ICD-10-CM | POA: Diagnosis not present

## 2020-06-14 DIAGNOSIS — H4051X0 Glaucoma secondary to other eye disorders, right eye, stage unspecified: Secondary | ICD-10-CM | POA: Diagnosis not present

## 2020-06-14 DIAGNOSIS — I4821 Permanent atrial fibrillation: Secondary | ICD-10-CM | POA: Diagnosis not present

## 2020-06-17 DIAGNOSIS — N1831 Chronic kidney disease, stage 3a: Secondary | ICD-10-CM | POA: Diagnosis not present

## 2020-06-17 DIAGNOSIS — Z9181 History of falling: Secondary | ICD-10-CM | POA: Diagnosis not present

## 2020-06-17 DIAGNOSIS — R29898 Other symptoms and signs involving the musculoskeletal system: Secondary | ICD-10-CM | POA: Diagnosis not present

## 2020-06-17 DIAGNOSIS — Z961 Presence of intraocular lens: Secondary | ICD-10-CM | POA: Diagnosis not present

## 2020-06-17 DIAGNOSIS — R1312 Dysphagia, oropharyngeal phase: Secondary | ICD-10-CM | POA: Diagnosis not present

## 2020-06-17 DIAGNOSIS — S7291XD Unspecified fracture of right femur, subsequent encounter for closed fracture with routine healing: Secondary | ICD-10-CM | POA: Diagnosis not present

## 2020-06-17 DIAGNOSIS — Z4781 Encounter for orthopedic aftercare following surgical amputation: Secondary | ICD-10-CM | POA: Diagnosis not present

## 2020-06-17 DIAGNOSIS — M6281 Muscle weakness (generalized): Secondary | ICD-10-CM | POA: Diagnosis not present

## 2020-06-17 DIAGNOSIS — R2689 Other abnormalities of gait and mobility: Secondary | ICD-10-CM | POA: Diagnosis not present

## 2020-06-17 DIAGNOSIS — K219 Gastro-esophageal reflux disease without esophagitis: Secondary | ICD-10-CM | POA: Diagnosis not present

## 2020-06-17 DIAGNOSIS — S72141D Displaced intertrochanteric fracture of right femur, subsequent encounter for closed fracture with routine healing: Secondary | ICD-10-CM | POA: Diagnosis not present

## 2020-06-17 DIAGNOSIS — I2510 Atherosclerotic heart disease of native coronary artery without angina pectoris: Secondary | ICD-10-CM | POA: Diagnosis not present

## 2020-06-17 DIAGNOSIS — S72141A Displaced intertrochanteric fracture of right femur, initial encounter for closed fracture: Secondary | ICD-10-CM | POA: Diagnosis not present

## 2020-06-17 DIAGNOSIS — Z7901 Long term (current) use of anticoagulants: Secondary | ICD-10-CM | POA: Diagnosis not present

## 2020-06-17 DIAGNOSIS — I10 Essential (primary) hypertension: Secondary | ICD-10-CM | POA: Diagnosis not present

## 2020-06-17 DIAGNOSIS — Z950 Presence of cardiac pacemaker: Secondary | ICD-10-CM | POA: Diagnosis not present

## 2020-06-17 DIAGNOSIS — N183 Chronic kidney disease, stage 3 unspecified: Secondary | ICD-10-CM | POA: Diagnosis not present

## 2020-06-17 DIAGNOSIS — R1310 Dysphagia, unspecified: Secondary | ICD-10-CM | POA: Diagnosis not present

## 2020-06-17 DIAGNOSIS — K5900 Constipation, unspecified: Secondary | ICD-10-CM | POA: Diagnosis not present

## 2020-06-17 DIAGNOSIS — Z743 Need for continuous supervision: Secondary | ICD-10-CM | POA: Diagnosis not present

## 2020-06-17 DIAGNOSIS — N189 Chronic kidney disease, unspecified: Secondary | ICD-10-CM | POA: Diagnosis not present

## 2020-06-17 DIAGNOSIS — I4821 Permanent atrial fibrillation: Secondary | ICD-10-CM | POA: Diagnosis not present

## 2020-06-17 DIAGNOSIS — R262 Difficulty in walking, not elsewhere classified: Secondary | ICD-10-CM | POA: Diagnosis not present

## 2020-06-17 DIAGNOSIS — I495 Sick sinus syndrome: Secondary | ICD-10-CM | POA: Diagnosis not present

## 2020-06-17 DIAGNOSIS — I4891 Unspecified atrial fibrillation: Secondary | ICD-10-CM | POA: Diagnosis not present

## 2020-06-17 DIAGNOSIS — S72001A Fracture of unspecified part of neck of right femur, initial encounter for closed fracture: Secondary | ICD-10-CM | POA: Diagnosis not present

## 2020-06-18 ENCOUNTER — Ambulatory Visit: Payer: Medicare Other | Admitting: Cardiology

## 2020-06-18 DIAGNOSIS — K219 Gastro-esophageal reflux disease without esophagitis: Secondary | ICD-10-CM | POA: Diagnosis not present

## 2020-06-18 DIAGNOSIS — I4891 Unspecified atrial fibrillation: Secondary | ICD-10-CM | POA: Diagnosis not present

## 2020-06-18 DIAGNOSIS — S72001A Fracture of unspecified part of neck of right femur, initial encounter for closed fracture: Secondary | ICD-10-CM | POA: Diagnosis not present

## 2020-06-18 DIAGNOSIS — N189 Chronic kidney disease, unspecified: Secondary | ICD-10-CM | POA: Diagnosis not present

## 2020-06-18 DIAGNOSIS — R1310 Dysphagia, unspecified: Secondary | ICD-10-CM | POA: Diagnosis not present

## 2020-06-18 NOTE — Progress Notes (Deleted)
Cardiology Office Note  Date: 06/18/2020   ID: Benjamin Mason, DOB Dec 26, 1930, MRN [id number]  PCP:  Manon Hilding, MD  Cardiologist:  Rozann Lesches, MD Electrophysiologist:  None   No chief complaint on file.   History of Present Illness: Benjamin Mason is an 86 y.o. male last seen in August.  He follows with Dr. Caryl Comes, [place] pacemaker in place.  Last device interrogation was in August.  Past Medical History:  Diagnosis Date  . Anxiety   . Aortic regurgitation    Mild to moderate  . CKD (chronic kidney disease) stage 3, GFR 30-59 ml/min   . Coronary atherosclerosis of native coronary artery    BMS to RCA and LAD 1995  . Essential hypertension   . Frequent PVCs   . IBS (irritable bowel syndrome)   . Lactose intolerance   . Mitral regurgitation    Moderate  . Mixed hyperlipidemia   . Permanent atrial fibrillation ([place])   . Sick sinus syndrome [name]    St. Jude single-chamber pacemaker May 2021 - Dr. Caryl Comes  . Skin cancer     Past Surgical History:  Procedure Laterality Date  . BALLOON DILATION  08/03/2012   Procedure: BALLOON DILATION;  Surgeon: Rogene Houston, MD;  [facility];  Service: Endoscopy;  Laterality: N/A;  . COLONOSCOPY WITH ESOPHAGOGASTRODUODENOSCOPY (EGD)  08/03/2012   Procedure: COLONOSCOPY WITH ESOPHAGOGASTRODUODENOSCOPY (EGD);  Surgeon: Rogene Houston, MD;  [facility];  Service: Endoscopy;  Laterality: N/A;  215  . LEFT HEART CATH AND CORONARY ANGIOGRAPHY N/A 11/18/2016   Procedure: Left Heart Cath and Coronary Angiography;  Surgeon: Peter M Martinique, MD;  Location: [place] CV LAB;  Service: Cardiovascular;  Laterality: N/A;  . LEFT INGUINAL HERNIA    . MALONEY DILATION  08/03/2012   Procedure: MALONEY DILATION;  Surgeon: Rogene Houston, MD;  [facility];  Service: Endoscopy;  Laterality: N/A;  . PACEMAKER IMPLANT N/A 12/30/2019   Procedure: PACEMAKER IMPLANT;  Surgeon: Deboraha Sprang, MD;   Location: [place] CV LAB;  Service: Cardiovascular;  Laterality: N/A;  . PENILE PROSTHESIS IMPLANT    . SAVORY DILATION  08/03/2012   Procedure: SAVORY DILATION;  Surgeon: Rogene Houston, MD;  [facility];  Service: Endoscopy;  Laterality: N/A;    Current Outpatient Medications  Medication Sig Dispense Refill  . acetaminophen (TYLENOL ARTHRITIS PAIN) 650 MG CR tablet Take 1,300 mg by mouth 2 (two) times daily.     Marland Kitchen apixaban (ELIQUIS) 2.5 MG TABS tablet Take 1 tablet (2.5 mg total) by mouth 2 (two) times daily. 60 tablet 6  . Apoaequorin (PREVAGEN) 10 MG CAPS Take 10 mg by mouth daily.     Marland Kitchen atenolol (TENORMIN) 50 MG tablet Take 1 tablet (50 mg total) by mouth daily. 30 tablet 6  . brimonidine (ALPHAGAN) 0.15 % ophthalmic solution Place 1 drop into the right eye 2 (two) times daily.    . cholecalciferol (VITAMIN D3) 25 MCG (1000 UT) tablet Take 1,000 Units by mouth daily.    . dorzolamide-timolol (COSOPT) 22.3-6.8 MG/ML ophthalmic solution 1 drop 2 (two) times daily.    . finasteride (PROSCAR) 5 MG tablet Take 5 mg by mouth daily.      . Lactase 9000 units CHEW Chew 1 tablet by mouth 3 (three) times daily as needed (when eating dairy products).     . melatonin 3 MG TABS tablet Take 9 mg by mouth  at bedtime.    . Multiple Vitamin (MULTIVITAMIN WITH MINERALS) TABS tablet Take 1 tablet by mouth daily.    . nitroGLYCERIN (NITROSTAT) 0.4 MG SL tablet Place 1 tablet (0.4 mg total) under the tongue every 5 (five) minutes x 3 doses as needed for chest pain (if no relief after 3rd dose, proceed to the ED for an evaluation or call 911). PLACE ONE TABLET UNDER THE TONGUE EVERY 5 MINUTES AS NEEDED FOR CHEST PAIN 25 tablet 3  . pantoprazole (PROTONIX) 40 MG tablet Take 40 mg by mouth daily.     . prednisoLONE acetate (PRED FORTE) 1 % ophthalmic suspension Place 1 drop into the right eye at bedtime.    . simvastatin (ZOCOR) 20 MG tablet Take 10 mg by mouth at bedtime.    . Tamsulosin HCl  (FLOMAX) 0.4 MG CAPS Take 0.4 mg by mouth at bedtime.     Marland Kitchen testosterone cypionate (DEPOTESTOSTERONE CYPIONATE) 200 MG/ML injection Inject 140 mg into the muscle every 14 (fourteen) days.    Marland Kitchen triamcinolone cream (KENALOG) 0.1 % Apply 1 application topically daily as needed (wound care).      No current facility-administered medications for this visit.   Allergies:  Patient has no known allergies.   Social History: The patient  reports that he quit smoking about 57 years ago. His smoking use included cigarettes. He has a 50.00 pack-year smoking history. He has never used smokeless tobacco. He reports that he does not drink alcohol and does not use drugs.   Family History: The patient's family history includes Heart attack in his father.   ROS:  Please see the history of present illness. Otherwise, complete review of systems is positive for {NONE DEFAULTED:18576::"none"}.  All other systems are reviewed and negative.   Physical Exam: VS:  There were no vitals taken for this visit., BMI There is no height or weight on file to calculate BMI.  Wt Readings from Last 3 Encounters:  04/07/20 171 lb 6.4 oz (77.7 kg)  03/18/20 173 lb (78.5 kg)  12/31/19 157 lb 14.4 oz (71.6 kg)    General: Patient appears comfortable at rest. HEENT: Conjunctiva and lids normal, oropharynx clear with moist mucosa. Neck: Supple, no elevated JVP or carotid bruits, no thyromegaly. Lungs: Clear to auscultation, nonlabored breathing at rest. Cardiac: Regular rate and rhythm, no S3 or significant systolic murmur, no pericardial rub. Abdomen: Soft, nontender, no hepatomegaly, bowel sounds present, no guarding or rebound. Extremities: No pitting edema, distal pulses 2+. Skin: Warm and dry. Musculoskeletal: No kyphosis. Neuropsychiatric: Alert and oriented x3, affect grossly appropriate.  ECG:  An ECG dated 03/18/2020 was personally reviewed today and demonstrated:  Ventricular pacing with underlying atrial fibrillation,  nonspecific T wave changes.  Recent Labwork: 12/29/2019: ALT 19; AST 29 12/31/2019: BUN 31; Creatinine, Ser 1.88; Potassium 4.4; Sodium 138 04/07/2020: Hemoglobin 10.7; Platelets 146   Other Studies Reviewed Today:  Echocardiogram 07/09/2019: 1. Left ventricular ejection fraction, by visual estimation, is 55 to  60%. The left ventricle has normal function. There is no left ventricular  hypertrophy.  2. Left ventricular diastolic parameters are indeterminate.  3. Global right ventricle has normal systolic function.The right  ventricular size is mildly enlarged. No increase in right ventricular wall  thickness.  4. Left atrial size was moderately dilated.  5. Right atrial size was moderately dilated.  6. Mild mitral annular calcification.  7. Mild to moderate aortic valve annular calcification.  8. The mitral valve is degenerative. Mild mitral valve regurgitation.  9. The tricuspid valve is grossly normal. Tricuspid valve regurgitation  is mild.  10. The aortic valve is tricuspid. Aortic valve regurgitation is mild.  Mild aortic valve sclerosis without stenosis.  11. The pulmonic valve was grossly normal. Pulmonic valve regurgitation is  mild.  12. Normal pulmonary artery systolic pressure.  13. The tricuspid regurgitant velocity is 2.23 m/s, and with an assumed  right atrial pressure of 8 mmHg, the estimated right ventricular systolic  pressure is normal at 27.9 mmHg.  14. The inferior vena cava is normal in size with <50% respiratory  variability, suggesting right atrial pressure of 8 mmHg.   Assessment and Plan:    Medication Adjustments/Labs and Tests Ordered: Current medicines are reviewed at length with the patient today.  Concerns regarding medicines are outlined above.   Tests Ordered: No orders of the defined types were placed in this encounter.   Medication Changes: No orders of the defined types were placed in this encounter.   Disposition:  Follow up  {follow up:15908}  Signed, Satira Sark, MD, [facility] 06/18/2020 12:50 PM    [facility], [place], [place] [address] Phone: [phone]; Fax: [phone]

## 2020-06-20 DIAGNOSIS — S72351D Displaced comminuted fracture of shaft of right femur, subsequent encounter for closed fracture with routine healing: Secondary | ICD-10-CM | POA: Diagnosis not present

## 2020-06-20 DIAGNOSIS — Z09 Encounter for follow-up examination after completed treatment for conditions other than malignant neoplasm: Secondary | ICD-10-CM | POA: Diagnosis not present

## 2020-06-20 DIAGNOSIS — R1312 Dysphagia, oropharyngeal phase: Secondary | ICD-10-CM | POA: Diagnosis not present

## 2020-06-20 DIAGNOSIS — I4891 Unspecified atrial fibrillation: Secondary | ICD-10-CM | POA: Diagnosis not present

## 2020-06-20 DIAGNOSIS — I495 Sick sinus syndrome: Secondary | ICD-10-CM | POA: Diagnosis not present

## 2020-06-20 DIAGNOSIS — Z7901 Long term (current) use of anticoagulants: Secondary | ICD-10-CM | POA: Diagnosis not present

## 2020-06-20 DIAGNOSIS — M6281 Muscle weakness (generalized): Secondary | ICD-10-CM | POA: Diagnosis not present

## 2020-06-20 DIAGNOSIS — R2689 Other abnormalities of gait and mobility: Secondary | ICD-10-CM | POA: Diagnosis not present

## 2020-06-20 DIAGNOSIS — Z950 Presence of cardiac pacemaker: Secondary | ICD-10-CM | POA: Diagnosis not present

## 2020-06-20 DIAGNOSIS — I4821 Permanent atrial fibrillation: Secondary | ICD-10-CM | POA: Diagnosis not present

## 2020-06-20 DIAGNOSIS — S7291XD Unspecified fracture of right femur, subsequent encounter for closed fracture with routine healing: Secondary | ICD-10-CM | POA: Diagnosis not present

## 2020-06-20 DIAGNOSIS — Z9181 History of falling: Secondary | ICD-10-CM | POA: Diagnosis not present

## 2020-06-20 DIAGNOSIS — M80051D Age-related osteoporosis with current pathological fracture, right femur, subsequent encounter for fracture with routine healing: Secondary | ICD-10-CM | POA: Diagnosis not present

## 2020-06-20 DIAGNOSIS — S72141D Displaced intertrochanteric fracture of right femur, subsequent encounter for closed fracture with routine healing: Secondary | ICD-10-CM | POA: Diagnosis not present

## 2020-06-20 DIAGNOSIS — I2510 Atherosclerotic heart disease of native coronary artery without angina pectoris: Secondary | ICD-10-CM | POA: Diagnosis not present

## 2020-06-20 DIAGNOSIS — I10 Essential (primary) hypertension: Secondary | ICD-10-CM | POA: Diagnosis not present

## 2020-06-30 DIAGNOSIS — M80051D Age-related osteoporosis with current pathological fracture, right femur, subsequent encounter for fracture with routine healing: Secondary | ICD-10-CM | POA: Diagnosis not present

## 2020-07-02 ENCOUNTER — Ambulatory Visit (INDEPENDENT_AMBULATORY_CARE_PROVIDER_SITE_OTHER): Payer: Medicare Other

## 2020-07-02 DIAGNOSIS — I495 Sick sinus syndrome: Secondary | ICD-10-CM

## 2020-07-02 LAB — CUP PACEART REMOTE DEVICE CHECK
Battery Remaining Longevity: 129 mo
Battery Remaining Percentage: 95.5 %
Battery Voltage: 3.02 V
Brady Statistic RV Percent Paced: 23 %
Date Time Interrogation Session: 20211118020012
Implantable Lead Implant Date: 20210517
Implantable Lead Location: 753860
Implantable Pulse Generator Implant Date: 20210517
Lead Channel Impedance Value: 540 Ohm
Lead Channel Pacing Threshold Amplitude: 0.5 V
Lead Channel Pacing Threshold Pulse Width: 0.5 ms
Lead Channel Sensing Intrinsic Amplitude: 4.7 mV
Lead Channel Setting Pacing Amplitude: 2.5 V
Lead Channel Setting Pacing Pulse Width: 0.5 ms
Lead Channel Setting Sensing Sensitivity: 0.7 mV
Pulse Gen Model: 1272
Pulse Gen Serial Number: 3800831

## 2020-07-03 DIAGNOSIS — S72351D Displaced comminuted fracture of shaft of right femur, subsequent encounter for closed fracture with routine healing: Secondary | ICD-10-CM | POA: Diagnosis not present

## 2020-07-03 DIAGNOSIS — Z09 Encounter for follow-up examination after completed treatment for conditions other than malignant neoplasm: Secondary | ICD-10-CM | POA: Diagnosis not present

## 2020-07-03 NOTE — Progress Notes (Signed)
Remote pacemaker transmission.   

## 2020-07-04 DIAGNOSIS — M80051D Age-related osteoporosis with current pathological fracture, right femur, subsequent encounter for fracture with routine healing: Secondary | ICD-10-CM | POA: Diagnosis not present

## 2020-07-04 DIAGNOSIS — I4891 Unspecified atrial fibrillation: Secondary | ICD-10-CM | POA: Diagnosis not present

## 2020-07-05 DIAGNOSIS — N1831 Chronic kidney disease, stage 3a: Secondary | ICD-10-CM | POA: Diagnosis not present

## 2020-07-05 DIAGNOSIS — K589 Irritable bowel syndrome without diarrhea: Secondary | ICD-10-CM | POA: Diagnosis not present

## 2020-07-05 DIAGNOSIS — S72141D Displaced intertrochanteric fracture of right femur, subsequent encounter for closed fracture with routine healing: Secondary | ICD-10-CM | POA: Diagnosis not present

## 2020-07-05 DIAGNOSIS — I2510 Atherosclerotic heart disease of native coronary artery without angina pectoris: Secondary | ICD-10-CM | POA: Diagnosis not present

## 2020-07-05 DIAGNOSIS — I130 Hypertensive heart and chronic kidney disease with heart failure and stage 1 through stage 4 chronic kidney disease, or unspecified chronic kidney disease: Secondary | ICD-10-CM | POA: Diagnosis not present

## 2020-07-05 DIAGNOSIS — I509 Heart failure, unspecified: Secondary | ICD-10-CM | POA: Diagnosis not present

## 2020-07-05 DIAGNOSIS — H5461 Unqualified visual loss, right eye, normal vision left eye: Secondary | ICD-10-CM | POA: Diagnosis not present

## 2020-07-05 DIAGNOSIS — E782 Mixed hyperlipidemia: Secondary | ICD-10-CM | POA: Diagnosis not present

## 2020-07-05 DIAGNOSIS — I495 Sick sinus syndrome: Secondary | ICD-10-CM | POA: Diagnosis not present

## 2020-07-05 DIAGNOSIS — I080 Rheumatic disorders of both mitral and aortic valves: Secondary | ICD-10-CM | POA: Diagnosis not present

## 2020-07-05 DIAGNOSIS — G4700 Insomnia, unspecified: Secondary | ICD-10-CM | POA: Diagnosis not present

## 2020-07-05 DIAGNOSIS — I4821 Permanent atrial fibrillation: Secondary | ICD-10-CM | POA: Diagnosis not present

## 2020-07-06 DIAGNOSIS — E782 Mixed hyperlipidemia: Secondary | ICD-10-CM | POA: Diagnosis not present

## 2020-07-06 DIAGNOSIS — S72141D Displaced intertrochanteric fracture of right femur, subsequent encounter for closed fracture with routine healing: Secondary | ICD-10-CM | POA: Diagnosis not present

## 2020-07-06 DIAGNOSIS — I080 Rheumatic disorders of both mitral and aortic valves: Secondary | ICD-10-CM | POA: Diagnosis not present

## 2020-07-06 DIAGNOSIS — I495 Sick sinus syndrome: Secondary | ICD-10-CM | POA: Diagnosis not present

## 2020-07-06 DIAGNOSIS — K589 Irritable bowel syndrome without diarrhea: Secondary | ICD-10-CM | POA: Diagnosis not present

## 2020-07-06 DIAGNOSIS — G4700 Insomnia, unspecified: Secondary | ICD-10-CM | POA: Diagnosis not present

## 2020-07-06 DIAGNOSIS — I2510 Atherosclerotic heart disease of native coronary artery without angina pectoris: Secondary | ICD-10-CM | POA: Diagnosis not present

## 2020-07-06 DIAGNOSIS — H5461 Unqualified visual loss, right eye, normal vision left eye: Secondary | ICD-10-CM | POA: Diagnosis not present

## 2020-07-06 DIAGNOSIS — I4821 Permanent atrial fibrillation: Secondary | ICD-10-CM | POA: Diagnosis not present

## 2020-07-06 DIAGNOSIS — N1831 Chronic kidney disease, stage 3a: Secondary | ICD-10-CM | POA: Diagnosis not present

## 2020-07-06 DIAGNOSIS — I509 Heart failure, unspecified: Secondary | ICD-10-CM | POA: Diagnosis not present

## 2020-07-06 DIAGNOSIS — I130 Hypertensive heart and chronic kidney disease with heart failure and stage 1 through stage 4 chronic kidney disease, or unspecified chronic kidney disease: Secondary | ICD-10-CM | POA: Diagnosis not present

## 2020-07-07 DIAGNOSIS — G4700 Insomnia, unspecified: Secondary | ICD-10-CM | POA: Diagnosis not present

## 2020-07-07 DIAGNOSIS — K589 Irritable bowel syndrome without diarrhea: Secondary | ICD-10-CM | POA: Diagnosis not present

## 2020-07-07 DIAGNOSIS — I2510 Atherosclerotic heart disease of native coronary artery without angina pectoris: Secondary | ICD-10-CM | POA: Diagnosis not present

## 2020-07-07 DIAGNOSIS — I495 Sick sinus syndrome: Secondary | ICD-10-CM | POA: Diagnosis not present

## 2020-07-07 DIAGNOSIS — I4821 Permanent atrial fibrillation: Secondary | ICD-10-CM | POA: Diagnosis not present

## 2020-07-07 DIAGNOSIS — E782 Mixed hyperlipidemia: Secondary | ICD-10-CM | POA: Diagnosis not present

## 2020-07-07 DIAGNOSIS — I080 Rheumatic disorders of both mitral and aortic valves: Secondary | ICD-10-CM | POA: Diagnosis not present

## 2020-07-07 DIAGNOSIS — N1831 Chronic kidney disease, stage 3a: Secondary | ICD-10-CM | POA: Diagnosis not present

## 2020-07-07 DIAGNOSIS — I509 Heart failure, unspecified: Secondary | ICD-10-CM | POA: Diagnosis not present

## 2020-07-07 DIAGNOSIS — H5461 Unqualified visual loss, right eye, normal vision left eye: Secondary | ICD-10-CM | POA: Diagnosis not present

## 2020-07-07 DIAGNOSIS — I130 Hypertensive heart and chronic kidney disease with heart failure and stage 1 through stage 4 chronic kidney disease, or unspecified chronic kidney disease: Secondary | ICD-10-CM | POA: Diagnosis not present

## 2020-07-07 DIAGNOSIS — S72141D Displaced intertrochanteric fracture of right femur, subsequent encounter for closed fracture with routine healing: Secondary | ICD-10-CM | POA: Diagnosis not present

## 2020-07-14 DIAGNOSIS — S72001A Fracture of unspecified part of neck of right femur, initial encounter for closed fracture: Secondary | ICD-10-CM | POA: Diagnosis not present

## 2020-07-15 DIAGNOSIS — I2510 Atherosclerotic heart disease of native coronary artery without angina pectoris: Secondary | ICD-10-CM | POA: Diagnosis not present

## 2020-07-15 DIAGNOSIS — G4700 Insomnia, unspecified: Secondary | ICD-10-CM | POA: Diagnosis not present

## 2020-07-15 DIAGNOSIS — I509 Heart failure, unspecified: Secondary | ICD-10-CM | POA: Diagnosis not present

## 2020-07-15 DIAGNOSIS — H5461 Unqualified visual loss, right eye, normal vision left eye: Secondary | ICD-10-CM | POA: Diagnosis not present

## 2020-07-15 DIAGNOSIS — I080 Rheumatic disorders of both mitral and aortic valves: Secondary | ICD-10-CM | POA: Diagnosis not present

## 2020-07-15 DIAGNOSIS — I495 Sick sinus syndrome: Secondary | ICD-10-CM | POA: Diagnosis not present

## 2020-07-15 DIAGNOSIS — I4821 Permanent atrial fibrillation: Secondary | ICD-10-CM | POA: Diagnosis not present

## 2020-07-15 DIAGNOSIS — S72141D Displaced intertrochanteric fracture of right femur, subsequent encounter for closed fracture with routine healing: Secondary | ICD-10-CM | POA: Diagnosis not present

## 2020-07-15 DIAGNOSIS — N1831 Chronic kidney disease, stage 3a: Secondary | ICD-10-CM | POA: Diagnosis not present

## 2020-07-15 DIAGNOSIS — E782 Mixed hyperlipidemia: Secondary | ICD-10-CM | POA: Diagnosis not present

## 2020-07-15 DIAGNOSIS — I130 Hypertensive heart and chronic kidney disease with heart failure and stage 1 through stage 4 chronic kidney disease, or unspecified chronic kidney disease: Secondary | ICD-10-CM | POA: Diagnosis not present

## 2020-07-15 DIAGNOSIS — K589 Irritable bowel syndrome without diarrhea: Secondary | ICD-10-CM | POA: Diagnosis not present

## 2020-07-16 ENCOUNTER — Other Ambulatory Visit: Payer: Self-pay

## 2020-07-16 ENCOUNTER — Telehealth: Payer: Medicare Other | Admitting: Internal Medicine

## 2020-07-16 DIAGNOSIS — I495 Sick sinus syndrome: Secondary | ICD-10-CM

## 2020-07-16 DIAGNOSIS — I4821 Permanent atrial fibrillation: Secondary | ICD-10-CM | POA: Diagnosis not present

## 2020-07-16 DIAGNOSIS — I080 Rheumatic disorders of both mitral and aortic valves: Secondary | ICD-10-CM | POA: Diagnosis not present

## 2020-07-16 DIAGNOSIS — E782 Mixed hyperlipidemia: Secondary | ICD-10-CM | POA: Diagnosis not present

## 2020-07-16 DIAGNOSIS — G4700 Insomnia, unspecified: Secondary | ICD-10-CM | POA: Diagnosis not present

## 2020-07-16 DIAGNOSIS — H5461 Unqualified visual loss, right eye, normal vision left eye: Secondary | ICD-10-CM | POA: Diagnosis not present

## 2020-07-16 DIAGNOSIS — I130 Hypertensive heart and chronic kidney disease with heart failure and stage 1 through stage 4 chronic kidney disease, or unspecified chronic kidney disease: Secondary | ICD-10-CM | POA: Diagnosis not present

## 2020-07-16 DIAGNOSIS — N1831 Chronic kidney disease, stage 3a: Secondary | ICD-10-CM | POA: Diagnosis not present

## 2020-07-16 DIAGNOSIS — I509 Heart failure, unspecified: Secondary | ICD-10-CM | POA: Diagnosis not present

## 2020-07-16 DIAGNOSIS — K589 Irritable bowel syndrome without diarrhea: Secondary | ICD-10-CM | POA: Diagnosis not present

## 2020-07-16 DIAGNOSIS — S72141D Displaced intertrochanteric fracture of right femur, subsequent encounter for closed fracture with routine healing: Secondary | ICD-10-CM | POA: Diagnosis not present

## 2020-07-16 DIAGNOSIS — I455 Other specified heart block: Secondary | ICD-10-CM

## 2020-07-16 DIAGNOSIS — I2510 Atherosclerotic heart disease of native coronary artery without angina pectoris: Secondary | ICD-10-CM | POA: Diagnosis not present

## 2020-07-23 DIAGNOSIS — H43812 Vitreous degeneration, left eye: Secondary | ICD-10-CM | POA: Diagnosis not present

## 2020-07-25 DIAGNOSIS — G4700 Insomnia, unspecified: Secondary | ICD-10-CM | POA: Diagnosis not present

## 2020-07-25 DIAGNOSIS — I495 Sick sinus syndrome: Secondary | ICD-10-CM | POA: Diagnosis not present

## 2020-07-25 DIAGNOSIS — S72141D Displaced intertrochanteric fracture of right femur, subsequent encounter for closed fracture with routine healing: Secondary | ICD-10-CM | POA: Diagnosis not present

## 2020-07-25 DIAGNOSIS — I4821 Permanent atrial fibrillation: Secondary | ICD-10-CM | POA: Diagnosis not present

## 2020-07-25 DIAGNOSIS — I2510 Atherosclerotic heart disease of native coronary artery without angina pectoris: Secondary | ICD-10-CM | POA: Diagnosis not present

## 2020-07-25 DIAGNOSIS — I130 Hypertensive heart and chronic kidney disease with heart failure and stage 1 through stage 4 chronic kidney disease, or unspecified chronic kidney disease: Secondary | ICD-10-CM | POA: Diagnosis not present

## 2020-07-25 DIAGNOSIS — N1831 Chronic kidney disease, stage 3a: Secondary | ICD-10-CM | POA: Diagnosis not present

## 2020-07-25 DIAGNOSIS — I080 Rheumatic disorders of both mitral and aortic valves: Secondary | ICD-10-CM | POA: Diagnosis not present

## 2020-07-25 DIAGNOSIS — H5461 Unqualified visual loss, right eye, normal vision left eye: Secondary | ICD-10-CM | POA: Diagnosis not present

## 2020-07-25 DIAGNOSIS — I509 Heart failure, unspecified: Secondary | ICD-10-CM | POA: Diagnosis not present

## 2020-07-25 DIAGNOSIS — K589 Irritable bowel syndrome without diarrhea: Secondary | ICD-10-CM | POA: Diagnosis not present

## 2020-07-25 DIAGNOSIS — E782 Mixed hyperlipidemia: Secondary | ICD-10-CM | POA: Diagnosis not present

## 2020-08-03 DIAGNOSIS — N183 Chronic kidney disease, stage 3 unspecified: Secondary | ICD-10-CM | POA: Diagnosis not present

## 2020-08-03 DIAGNOSIS — I4891 Unspecified atrial fibrillation: Secondary | ICD-10-CM | POA: Diagnosis not present

## 2020-08-03 DIAGNOSIS — S72001A Fracture of unspecified part of neck of right femur, initial encounter for closed fracture: Secondary | ICD-10-CM | POA: Diagnosis not present

## 2020-08-11 DIAGNOSIS — S72141D Displaced intertrochanteric fracture of right femur, subsequent encounter for closed fracture with routine healing: Secondary | ICD-10-CM | POA: Diagnosis not present

## 2020-08-11 DIAGNOSIS — S7221XD Displaced subtrochanteric fracture of right femur, subsequent encounter for closed fracture with routine healing: Secondary | ICD-10-CM | POA: Diagnosis not present

## 2020-08-11 DIAGNOSIS — Z09 Encounter for follow-up examination after completed treatment for conditions other than malignant neoplasm: Secondary | ICD-10-CM | POA: Diagnosis not present

## 2020-08-14 DIAGNOSIS — E7849 Other hyperlipidemia: Secondary | ICD-10-CM | POA: Diagnosis not present

## 2020-08-14 DIAGNOSIS — I482 Chronic atrial fibrillation, unspecified: Secondary | ICD-10-CM | POA: Diagnosis not present

## 2020-08-14 DIAGNOSIS — N183 Chronic kidney disease, stage 3 unspecified: Secondary | ICD-10-CM | POA: Diagnosis not present

## 2020-08-14 DIAGNOSIS — I129 Hypertensive chronic kidney disease with stage 1 through stage 4 chronic kidney disease, or unspecified chronic kidney disease: Secondary | ICD-10-CM | POA: Diagnosis not present

## 2020-08-27 DIAGNOSIS — H35372 Puckering of macula, left eye: Secondary | ICD-10-CM | POA: Diagnosis not present

## 2020-08-31 ENCOUNTER — Telehealth: Payer: Medicare Other | Admitting: Internal Medicine

## 2020-09-08 DIAGNOSIS — I482 Chronic atrial fibrillation, unspecified: Secondary | ICD-10-CM | POA: Diagnosis not present

## 2020-09-08 DIAGNOSIS — D519 Vitamin B12 deficiency anemia, unspecified: Secondary | ICD-10-CM | POA: Diagnosis not present

## 2020-09-08 DIAGNOSIS — D529 Folate deficiency anemia, unspecified: Secondary | ICD-10-CM | POA: Diagnosis not present

## 2020-09-08 DIAGNOSIS — K2100 Gastro-esophageal reflux disease with esophagitis, without bleeding: Secondary | ICD-10-CM | POA: Diagnosis not present

## 2020-09-08 DIAGNOSIS — E7849 Other hyperlipidemia: Secondary | ICD-10-CM | POA: Diagnosis not present

## 2020-09-08 DIAGNOSIS — D638 Anemia in other chronic diseases classified elsewhere: Secondary | ICD-10-CM | POA: Diagnosis not present

## 2020-09-08 DIAGNOSIS — N183 Chronic kidney disease, stage 3 unspecified: Secondary | ICD-10-CM | POA: Diagnosis not present

## 2020-09-08 DIAGNOSIS — I10 Essential (primary) hypertension: Secondary | ICD-10-CM | POA: Diagnosis not present

## 2020-09-08 DIAGNOSIS — E782 Mixed hyperlipidemia: Secondary | ICD-10-CM | POA: Diagnosis not present

## 2020-09-11 DIAGNOSIS — I2510 Atherosclerotic heart disease of native coronary artery without angina pectoris: Secondary | ICD-10-CM | POA: Diagnosis not present

## 2020-09-11 DIAGNOSIS — E7849 Other hyperlipidemia: Secondary | ICD-10-CM | POA: Diagnosis not present

## 2020-09-11 DIAGNOSIS — I10 Essential (primary) hypertension: Secondary | ICD-10-CM | POA: Diagnosis not present

## 2020-09-11 DIAGNOSIS — Z1389 Encounter for screening for other disorder: Secondary | ICD-10-CM | POA: Diagnosis not present

## 2020-09-11 DIAGNOSIS — G3184 Mild cognitive impairment, so stated: Secondary | ICD-10-CM | POA: Diagnosis not present

## 2020-09-23 DIAGNOSIS — H35372 Puckering of macula, left eye: Secondary | ICD-10-CM | POA: Diagnosis not present

## 2020-10-01 ENCOUNTER — Ambulatory Visit (INDEPENDENT_AMBULATORY_CARE_PROVIDER_SITE_OTHER): Payer: Medicare Other

## 2020-10-01 DIAGNOSIS — I495 Sick sinus syndrome: Secondary | ICD-10-CM | POA: Diagnosis not present

## 2020-10-01 DIAGNOSIS — I4821 Permanent atrial fibrillation: Secondary | ICD-10-CM

## 2020-10-02 LAB — CUP PACEART REMOTE DEVICE CHECK
Battery Remaining Longevity: 127 mo
Battery Remaining Percentage: 95.5 %
Battery Voltage: 3.02 V
Brady Statistic RV Percent Paced: 25 %
Date Time Interrogation Session: 20220218002242
Implantable Lead Implant Date: 20210517
Implantable Lead Location: 753860
Implantable Pulse Generator Implant Date: 20210517
Lead Channel Impedance Value: 540 Ohm
Lead Channel Pacing Threshold Amplitude: 0.5 V
Lead Channel Pacing Threshold Pulse Width: 0.5 ms
Lead Channel Sensing Intrinsic Amplitude: 4.2 mV
Lead Channel Setting Pacing Amplitude: 2.5 V
Lead Channel Setting Pacing Pulse Width: 0.5 ms
Lead Channel Setting Sensing Sensitivity: 0.7 mV
Pulse Gen Model: 1272
Pulse Gen Serial Number: 3800831

## 2020-10-07 NOTE — Progress Notes (Signed)
Remote pacemaker transmission.   

## 2020-10-13 DIAGNOSIS — Z950 Presence of cardiac pacemaker: Secondary | ICD-10-CM | POA: Insufficient documentation

## 2020-10-15 ENCOUNTER — Telehealth (INDEPENDENT_AMBULATORY_CARE_PROVIDER_SITE_OTHER): Payer: Medicare Other | Admitting: Internal Medicine

## 2020-10-15 ENCOUNTER — Other Ambulatory Visit: Payer: Self-pay

## 2020-10-15 ENCOUNTER — Telehealth: Payer: Self-pay

## 2020-10-15 VITALS — BP 136/76 | HR 63 | Wt 160.0 lb

## 2020-10-15 DIAGNOSIS — Z950 Presence of cardiac pacemaker: Secondary | ICD-10-CM

## 2020-10-15 DIAGNOSIS — I495 Sick sinus syndrome: Secondary | ICD-10-CM | POA: Diagnosis not present

## 2020-10-15 DIAGNOSIS — I455 Other specified heart block: Secondary | ICD-10-CM | POA: Diagnosis not present

## 2020-10-15 NOTE — Telephone Encounter (Signed)
  Patient Consent for Virtual Visit         ROWYN SPILDE has provided verbal consent on 10/15/2020 for a virtual visit (video or telephone).   CONSENT FOR VIRTUAL VISIT FOR:  Gordan Payment  By participating in this virtual visit I agree to the following:  I hereby voluntarily request, consent and authorize [facility] and its employed or contracted physicians, physician assistants, nurse practitioners or other licensed health care professionals (the Practitioner), to provide me with telemedicine health care services (the "Services") as deemed necessary by the treating Practitioner. I acknowledge and consent to receive the Services by the Practitioner via telemedicine. I understand that the telemedicine visit will involve communicating with the Practitioner through live audiovisual communication technology and the disclosure of certain medical information by electronic transmission. I acknowledge that I have been given the opportunity to request an in-person assessment or other available alternative prior to the telemedicine visit and am voluntarily participating in the telemedicine visit.  I understand that I have the right to withhold or withdraw my consent to the use of telemedicine in the course of my care at any time, without affecting my right to future care or treatment, and that the Practitioner or I may terminate the telemedicine visit at any time. I understand that I have the right to inspect all information obtained and/or recorded in the course of the telemedicine visit and may receive copies of available information for a reasonable fee.  I understand that some of the potential risks of receiving the Services via telemedicine include:  Marland Kitchen Delay or interruption in medical evaluation due to technological equipment failure or disruption; . Information transmitted may not be sufficient (e.g. poor resolution of images) to allow for appropriate medical decision making by the  Practitioner; and/or  . In rare instances, security protocols could fail, causing a breach of personal health information.  Furthermore, I acknowledge that it is my responsibility to provide information about my medical history, conditions and care that is complete and accurate to the best of my ability. I acknowledge that Practitioner's advice, recommendations, and/or decision may be based on factors not within their control, such as incomplete or inaccurate data provided by me or distortions of diagnostic images or specimens that may result from electronic transmissions. I understand that the practice of medicine is not an exact science and that Practitioner makes no warranties or guarantees regarding treatment outcomes. I acknowledge that a copy of this consent can be made available to me via my patient portal ([facility]), or I can request a printed copy by calling the office of [facility].    I understand that my insurance will be billed for this visit.   I have read or had this consent read to me. . I understand the contents of this consent, which adequately explains the benefits and risks of the Services being provided via telemedicine.  . I have been provided ample opportunity to ask questions regarding this consent and the Services and have had my questions answered to my satisfaction. . I give my informed consent for the services to be provided through the use of telemedicine in my medical care

## 2020-10-15 NOTE — Progress Notes (Signed)
Electrophysiology TeleHealth Note   Due to national recommendations of social distancing due to COVID 19, an audio/video telehealth visit is felt to be most appropriate for this patient at this time.  See MyChart message from today for the patient's consent to telehealth for [name].   Date:  10/17/2020   ID:  Benjamin Mason, DOB October 22, 1930, MRN [id number]  Location: patient's home  Provider location: [address], [place]  Evaluation Performed: Follow-up visit  PCP:  Manon Hilding, MD  Cardiologist:     Electrophysiologist:  SK   Chief Complaint:  syncope  History of Present Illness:    Benjamin Mason is a 87 y.o. male who presents via audio/video conferencing for a telehealth visit today.  Since last being seen in our clinic for * afib, presyncope/syncope assoc with pauses*4-8 sec> pacemaker  the patient reports no interval syncope The patient denies chest pain, shortness of breath, nocturnal dyspnea, orthopnea or peripheral edema.  There have been no palpitations, lightheadedness         4/18 LHC % CAD Nonobstructive  11/20 Echo 55-60% BAE- mod  12/20 MYOVIEW 45-54%    Date Cr K Hgb  10/17 1.72 4.6   4/21 2.12 4.0 12  5/21 1.88 4.4 10.7  MCV>105 x 8+ years--B12 folate normal(scanned)2013   Date PVCs  11/20 30.3%           The patient denies symptoms of fevers, chills, cough, or new SOB worrisome for COVID 19.    Past Medical History:  Diagnosis Date  . Anxiety   . Aortic regurgitation    Mild to moderate  . CKD (chronic kidney disease) stage 3, GFR 30-59 ml/min   . Coronary atherosclerosis of native coronary artery    BMS to RCA and LAD 1995  . Essential hypertension   . Frequent PVCs   . IBS (irritable bowel syndrome)   . Lactose intolerance   . Mitral regurgitation    Moderate  . Mixed hyperlipidemia   . Permanent atrial fibrillation ([facility])   . Sick sinus syndrome [facility])    St. Jude single-chamber  pacemaker May 2021 - Dr. Caryl Comes  . Skin cancer     Past Surgical History:  Procedure Laterality Date  . BALLOON DILATION  08/03/2012   Procedure: BALLOON DILATION;  Surgeon: Rogene Houston, MD;  [facility];  Service: Endoscopy;  Laterality: N/A;  . COLONOSCOPY WITH ESOPHAGOGASTRODUODENOSCOPY (EGD)  08/03/2012   Procedure: COLONOSCOPY WITH ESOPHAGOGASTRODUODENOSCOPY (EGD);  Surgeon: Rogene Houston, MD;  [facility];  Service: Endoscopy;  Laterality: N/A;  215  . LEFT HEART CATH AND CORONARY ANGIOGRAPHY N/A 11/18/2016   Procedure: Left Heart Cath and Coronary Angiography;  Surgeon: Peter M Martinique, MD;  Location: [facility] CV LAB;  Service: Cardiovascular;  Laterality: N/A;  . LEFT INGUINAL HERNIA    . MALONEY DILATION  08/03/2012   Procedure: MALONEY DILATION;  Surgeon: Rogene Houston, MD;  [facility];  Service: Endoscopy;  Laterality: N/A;  . PACEMAKER IMPLANT N/A 12/30/2019   Procedure: PACEMAKER IMPLANT;  Surgeon: Deboraha Sprang, MD;  Location: [facility] CV LAB;  Service: Cardiovascular;  Laterality: N/A;  . PENILE PROSTHESIS IMPLANT    . SAVORY DILATION  08/03/2012   Procedure: SAVORY DILATION;  Surgeon: Rogene Houston, MD;  [facility];  Service: Endoscopy;  Laterality: N/A;    Current Outpatient Medications  Medication Sig Dispense Refill  .  acetaminophen (TYLENOL) 650 MG CR tablet Take 1,300 mg by mouth 2 (two) times daily.    Marland Kitchen apixaban (ELIQUIS) 2.5 MG TABS tablet Take 1 tablet (2.5 mg total) by mouth 2 (two) times daily. 60 tablet 6  . Apoaequorin 10 MG CAPS Take 10 mg by mouth daily.     Marland Kitchen atenolol (TENORMIN) 50 MG tablet Take 1 tablet (50 mg total) by mouth daily. 30 tablet 6  . cholecalciferol (VITAMIN D3) 25 MCG (1000 UT) tablet Take 1,000 Units by mouth daily.    . finasteride (PROSCAR) 5 MG tablet Take 5 mg by mouth daily.      . Lactase 9000 units CHEW Chew 1 tablet by mouth 3 (three) times daily as needed (when  eating dairy products).     . melatonin 3 MG TABS tablet Take 3 mg by mouth at bedtime.    . Multiple Vitamin (MULTIVITAMIN WITH MINERALS) TABS tablet Take 1 tablet by mouth daily.    . nitroGLYCERIN (NITROSTAT) 0.4 MG SL tablet Place 1 tablet (0.4 mg total) under the tongue every 5 (five) minutes x 3 doses as needed for chest pain (if no relief after 3rd dose, proceed to the ED for an evaluation or call 911). PLACE ONE TABLET UNDER THE TONGUE EVERY 5 MINUTES AS NEEDED FOR CHEST PAIN 25 tablet 3  . pantoprazole (PROTONIX) 40 MG tablet Take 40 mg by mouth daily.     . tamsulosin (FLOMAX) 0.4 MG CAPS capsule Take 0.4 mg by mouth at bedtime.     . triamcinolone cream (KENALOG) 0.1 % Apply 1 application topically daily as needed (wound care).     . brimonidine (ALPHAGAN) 0.15 % ophthalmic solution Place 1 drop into the right eye 2 (two) times daily. (Patient not taking: Reported on 10/15/2020)    . dorzolamide-timolol (COSOPT) 22.3-6.8 MG/ML ophthalmic solution 1 drop 2 (two) times daily. (Patient not taking: Reported on 10/15/2020)    . prednisoLONE acetate (PRED FORTE) 1 % ophthalmic suspension Place 1 drop into the right eye at bedtime. (Patient not taking: Reported on 10/15/2020)    . simvastatin (ZOCOR) 20 MG tablet Take 20 mg by mouth at bedtime.    Marland Kitchen testosterone cypionate (DEPOTESTOSTERONE CYPIONATE) 200 MG/ML injection Inject 140 mg into the muscle every 14 (fourteen) days. (Patient not taking: Reported on 10/15/2020)     No current facility-administered medications for this visit.    Allergies:   Patient has no known allergies.   Social History:  The patient  reports that he quit smoking about 58 years ago. His smoking use included cigarettes. He has a 50.00 pack-year smoking history. He has never used smokeless tobacco. He reports that he does not drink alcohol and does not use drugs.   Family History:  The patient's   family history includes Heart attack in his father.   ROS:  Please see the  history of present illness.   All other systems are personally reviewed and negative.    Exam:    Vital Signs:  BP 136/76   Pulse 63   Wt 160 lb (72.6 kg)   BMI 21.70 kg/m     Labs/Other Tests and Data Reviewed:    Recent Labs: 12/29/2019: ALT 19 12/31/2019: BUN 31; Creatinine, Ser 1.88; Potassium 4.4; Sodium 138 04/07/2020: Hemoglobin 10.7; Platelets 146   Wt Readings from Last 3 Encounters:  10/15/20 160 lb (72.6 kg)  04/07/20 171 lb 6.4 oz (77.7 kg)  03/18/20 173 lb (78.5 kg)  Other studies personally reviewed: Additional studies/ records that were reviewed today I  DEvice checked remotely most recently 2/22  Demonstrated  VVI pacemaker St Jude Vp at 25%   Rare rapid afib < 1% ; the peak of HR 80-90 may represent PVCs    ASSESSMENT & PLAN:    PVCs-late coupled  Afib permanent   Syncope  CHF chronic diastolic  Pacemaker St Jude single chamber  Renal insufficiency grade 3-4  Dementia   No interval syncope  Stable functional status        COVID 19 screen The patient denies symptoms of COVID 19 at this time.  The importance of social distancing was discussed today.  Follow-up: 4m    Current medicines are reviewed at length with the patient today.   The patient does not have concerns regarding his medicines.  The following changes were made today:  none  Labs/ tests ordered today include:   No orders of the defined types were placed in this encounter.    Patient Risk:  after full review of this patients clinical status, I feel that they are at moderate  risk at this time.  Today, I have spent6 minutes with the patient with telehealth technology discussing the above.  Signed, Virl Axe, MD  10/17/2020 7:10 PM     [place] [address] [phone] (office) [phone] (fax)

## 2020-10-20 NOTE — Patient Instructions (Signed)

## 2020-11-19 ENCOUNTER — Other Ambulatory Visit: Payer: Self-pay | Admitting: Cardiology

## 2020-11-19 NOTE — Telephone Encounter (Signed)
Prescription refill request for Eliquis received. Indication: Atrial Fib Last office visit: 10/15/20 Scr: 1.51 on 06/17/20 Age: 87 Weight: 72.6 kg  Based on above findings Eliquis 2.5mg  twice daily is the appropriate dose.  Refill approved.

## 2020-12-31 ENCOUNTER — Telehealth: Payer: Self-pay | Admitting: Emergency Medicine

## 2020-12-31 ENCOUNTER — Ambulatory Visit (INDEPENDENT_AMBULATORY_CARE_PROVIDER_SITE_OTHER): Payer: Medicare Other

## 2020-12-31 DIAGNOSIS — I495 Sick sinus syndrome: Secondary | ICD-10-CM

## 2020-12-31 LAB — CUP PACEART REMOTE DEVICE CHECK
Battery Remaining Longevity: 125 mo
Battery Remaining Percentage: 95.5 %
Battery Voltage: 3.02 V
Brady Statistic RV Percent Paced: 28 %
Date Time Interrogation Session: 20220519020035
Implantable Lead Implant Date: 20210517
Implantable Lead Location: 753860
Implantable Pulse Generator Implant Date: 20210517
Lead Channel Impedance Value: 550 Ohm
Lead Channel Pacing Threshold Amplitude: 0.5 V
Lead Channel Pacing Threshold Pulse Width: 0.5 ms
Lead Channel Sensing Intrinsic Amplitude: 3.4 mV
Lead Channel Setting Pacing Amplitude: 2.5 V
Lead Channel Setting Pacing Pulse Width: 0.5 ms
Lead Channel Setting Sensing Sensitivity: 0.7 mV
Pulse Gen Model: 1272
Pulse Gen Serial Number: 3800831

## 2020-12-31 NOTE — Telephone Encounter (Signed)
Spoke with daughter Kim(DPR) . Patient has dementia. Patient has not had a change in condition. May miss dose of atenolol or Eliquis occasionally. Education done on importance of taking meds consistently.Will call office if patient has any issues.

## 2021-01-07 DIAGNOSIS — H524 Presbyopia: Secondary | ICD-10-CM | POA: Diagnosis not present

## 2021-01-22 NOTE — Progress Notes (Signed)
Remote pacemaker transmission.   

## 2021-01-28 DIAGNOSIS — H43392 Other vitreous opacities, left eye: Secondary | ICD-10-CM | POA: Diagnosis not present

## 2021-01-28 DIAGNOSIS — H3540 Unspecified peripheral retinal degeneration: Secondary | ICD-10-CM | POA: Diagnosis not present

## 2021-01-28 DIAGNOSIS — H43812 Vitreous degeneration, left eye: Secondary | ICD-10-CM | POA: Diagnosis not present

## 2021-02-18 ENCOUNTER — Other Ambulatory Visit: Payer: Self-pay | Admitting: Internal Medicine

## 2021-02-24 DIAGNOSIS — M1612 Unilateral primary osteoarthritis, left hip: Secondary | ICD-10-CM | POA: Diagnosis not present

## 2021-02-24 DIAGNOSIS — S72141D Displaced intertrochanteric fracture of right femur, subsequent encounter for closed fracture with routine healing: Secondary | ICD-10-CM | POA: Diagnosis not present

## 2021-02-24 DIAGNOSIS — Z09 Encounter for follow-up examination after completed treatment for conditions other than malignant neoplasm: Secondary | ICD-10-CM | POA: Diagnosis not present

## 2021-02-26 DIAGNOSIS — E7849 Other hyperlipidemia: Secondary | ICD-10-CM | POA: Diagnosis not present

## 2021-02-26 DIAGNOSIS — I10 Essential (primary) hypertension: Secondary | ICD-10-CM | POA: Diagnosis not present

## 2021-02-26 DIAGNOSIS — E875 Hyperkalemia: Secondary | ICD-10-CM | POA: Diagnosis not present

## 2021-02-26 DIAGNOSIS — K2100 Gastro-esophageal reflux disease with esophagitis, without bleeding: Secondary | ICD-10-CM | POA: Diagnosis not present

## 2021-02-26 DIAGNOSIS — E782 Mixed hyperlipidemia: Secondary | ICD-10-CM | POA: Diagnosis not present

## 2021-02-26 DIAGNOSIS — N183 Chronic kidney disease, stage 3 unspecified: Secondary | ICD-10-CM | POA: Diagnosis not present

## 2021-02-26 DIAGNOSIS — R739 Hyperglycemia, unspecified: Secondary | ICD-10-CM | POA: Diagnosis not present

## 2021-03-02 DIAGNOSIS — I10 Essential (primary) hypertension: Secondary | ICD-10-CM | POA: Diagnosis not present

## 2021-03-02 DIAGNOSIS — I2510 Atherosclerotic heart disease of native coronary artery without angina pectoris: Secondary | ICD-10-CM | POA: Diagnosis not present

## 2021-03-02 DIAGNOSIS — D638 Anemia in other chronic diseases classified elsewhere: Secondary | ICD-10-CM | POA: Diagnosis not present

## 2021-03-02 DIAGNOSIS — K2100 Gastro-esophageal reflux disease with esophagitis, without bleeding: Secondary | ICD-10-CM | POA: Diagnosis not present

## 2021-03-02 DIAGNOSIS — E7849 Other hyperlipidemia: Secondary | ICD-10-CM | POA: Diagnosis not present

## 2021-03-02 DIAGNOSIS — G3184 Mild cognitive impairment, so stated: Secondary | ICD-10-CM | POA: Diagnosis not present

## 2021-04-01 ENCOUNTER — Ambulatory Visit (INDEPENDENT_AMBULATORY_CARE_PROVIDER_SITE_OTHER): Payer: Medicare Other

## 2021-04-01 DIAGNOSIS — I495 Sick sinus syndrome: Secondary | ICD-10-CM | POA: Diagnosis not present

## 2021-04-01 LAB — CUP PACEART REMOTE DEVICE CHECK
Battery Remaining Longevity: 119 mo
Battery Remaining Percentage: 93 %
Battery Voltage: 3.02 V
Brady Statistic RV Percent Paced: 30 %
Date Time Interrogation Session: 20220818020018
Implantable Lead Implant Date: 20210517
Implantable Lead Location: 753860
Implantable Pulse Generator Implant Date: 20210517
Lead Channel Impedance Value: 530 Ohm
Lead Channel Pacing Threshold Amplitude: 0.5 V
Lead Channel Pacing Threshold Pulse Width: 0.5 ms
Lead Channel Sensing Intrinsic Amplitude: 4.2 mV
Lead Channel Setting Pacing Amplitude: 2.5 V
Lead Channel Setting Pacing Pulse Width: 0.5 ms
Lead Channel Setting Sensing Sensitivity: 0.7 mV
Pulse Gen Model: 1272
Pulse Gen Serial Number: 3800831

## 2021-04-20 IMAGING — CR DG CHEST 2V
2 series · 2 of 2 positions shown · non-contrast
Comparison: 12/10/2019

CLINICAL DATA: Cardiac device placement

EXAM:
CHEST - 2 VIEW

[chest lat]
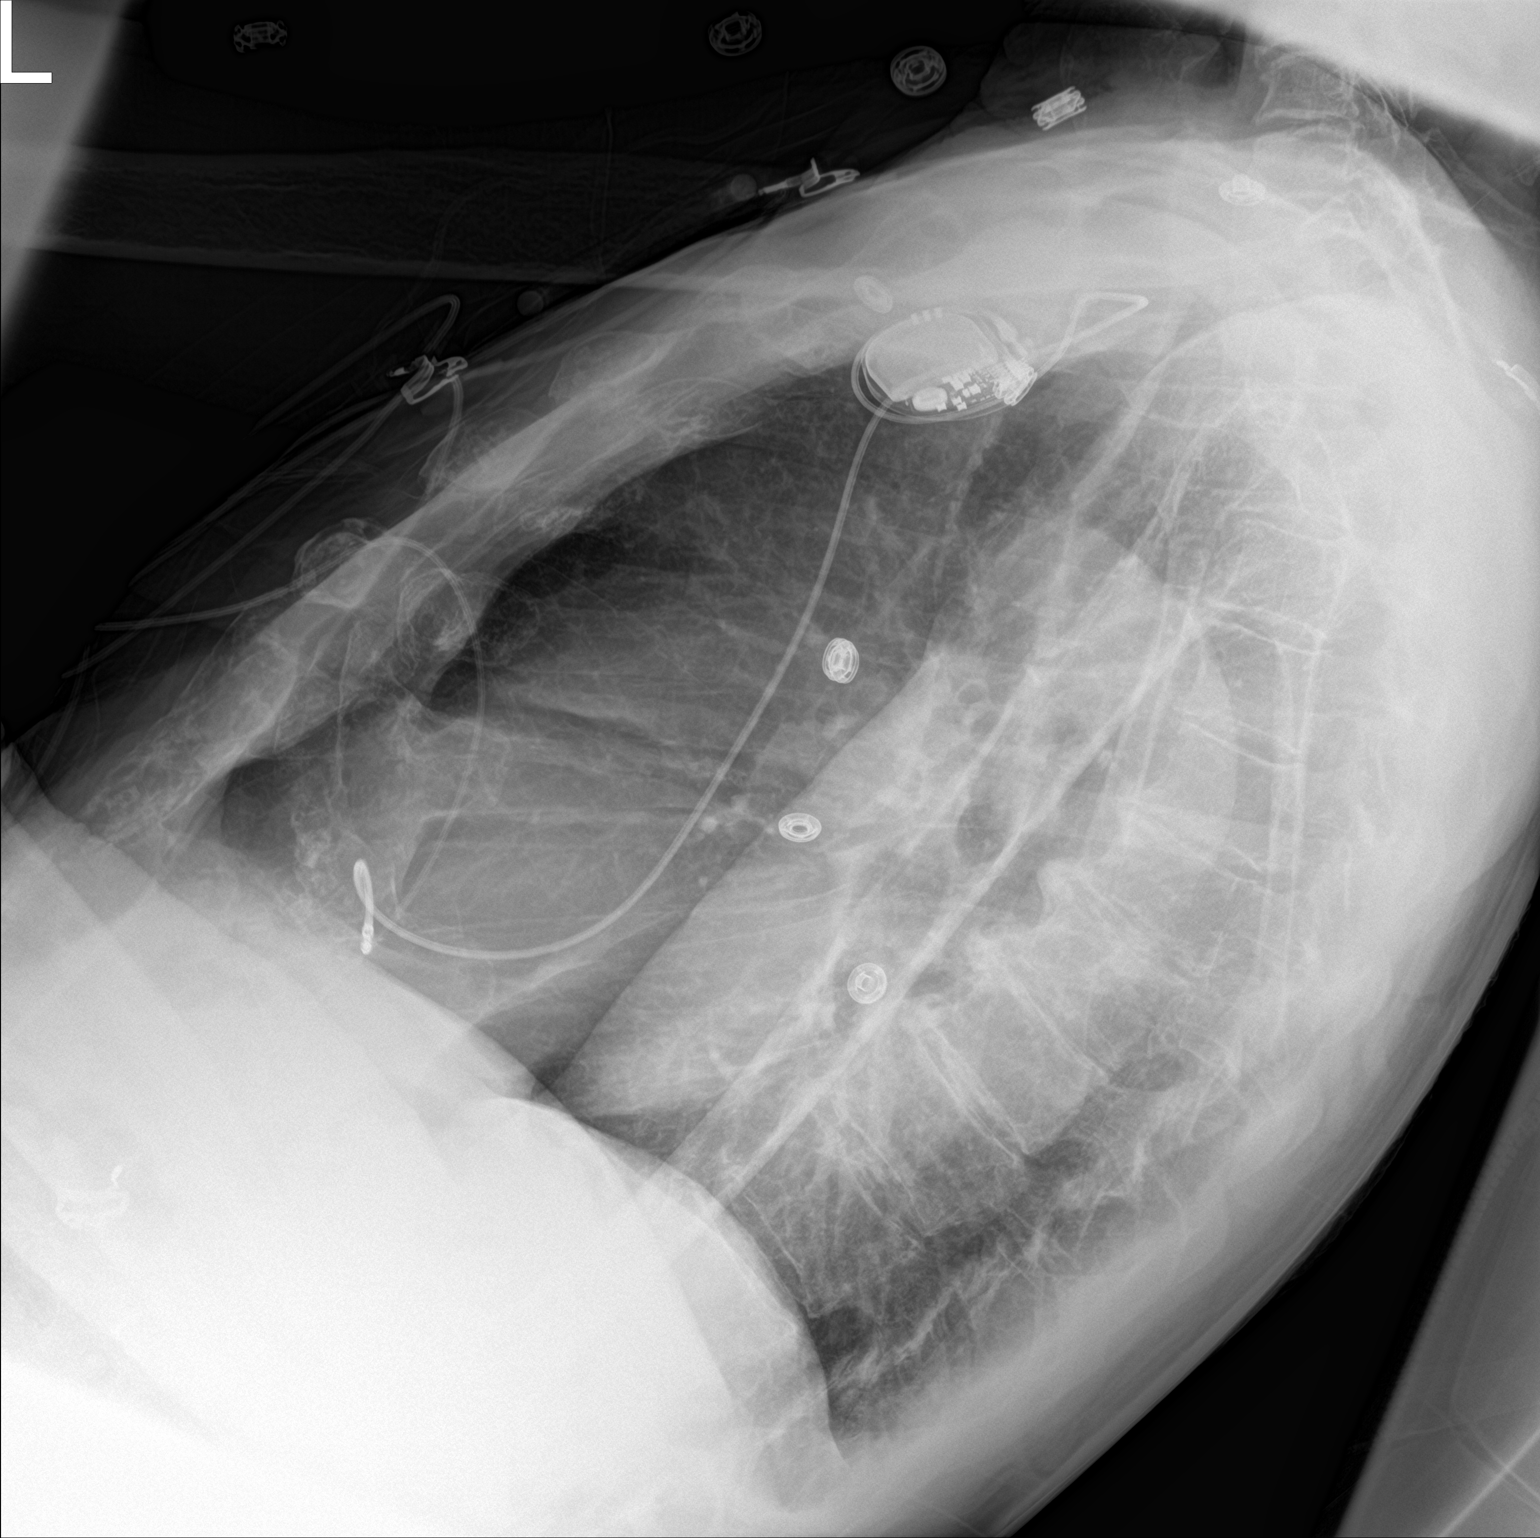

[chest ap]
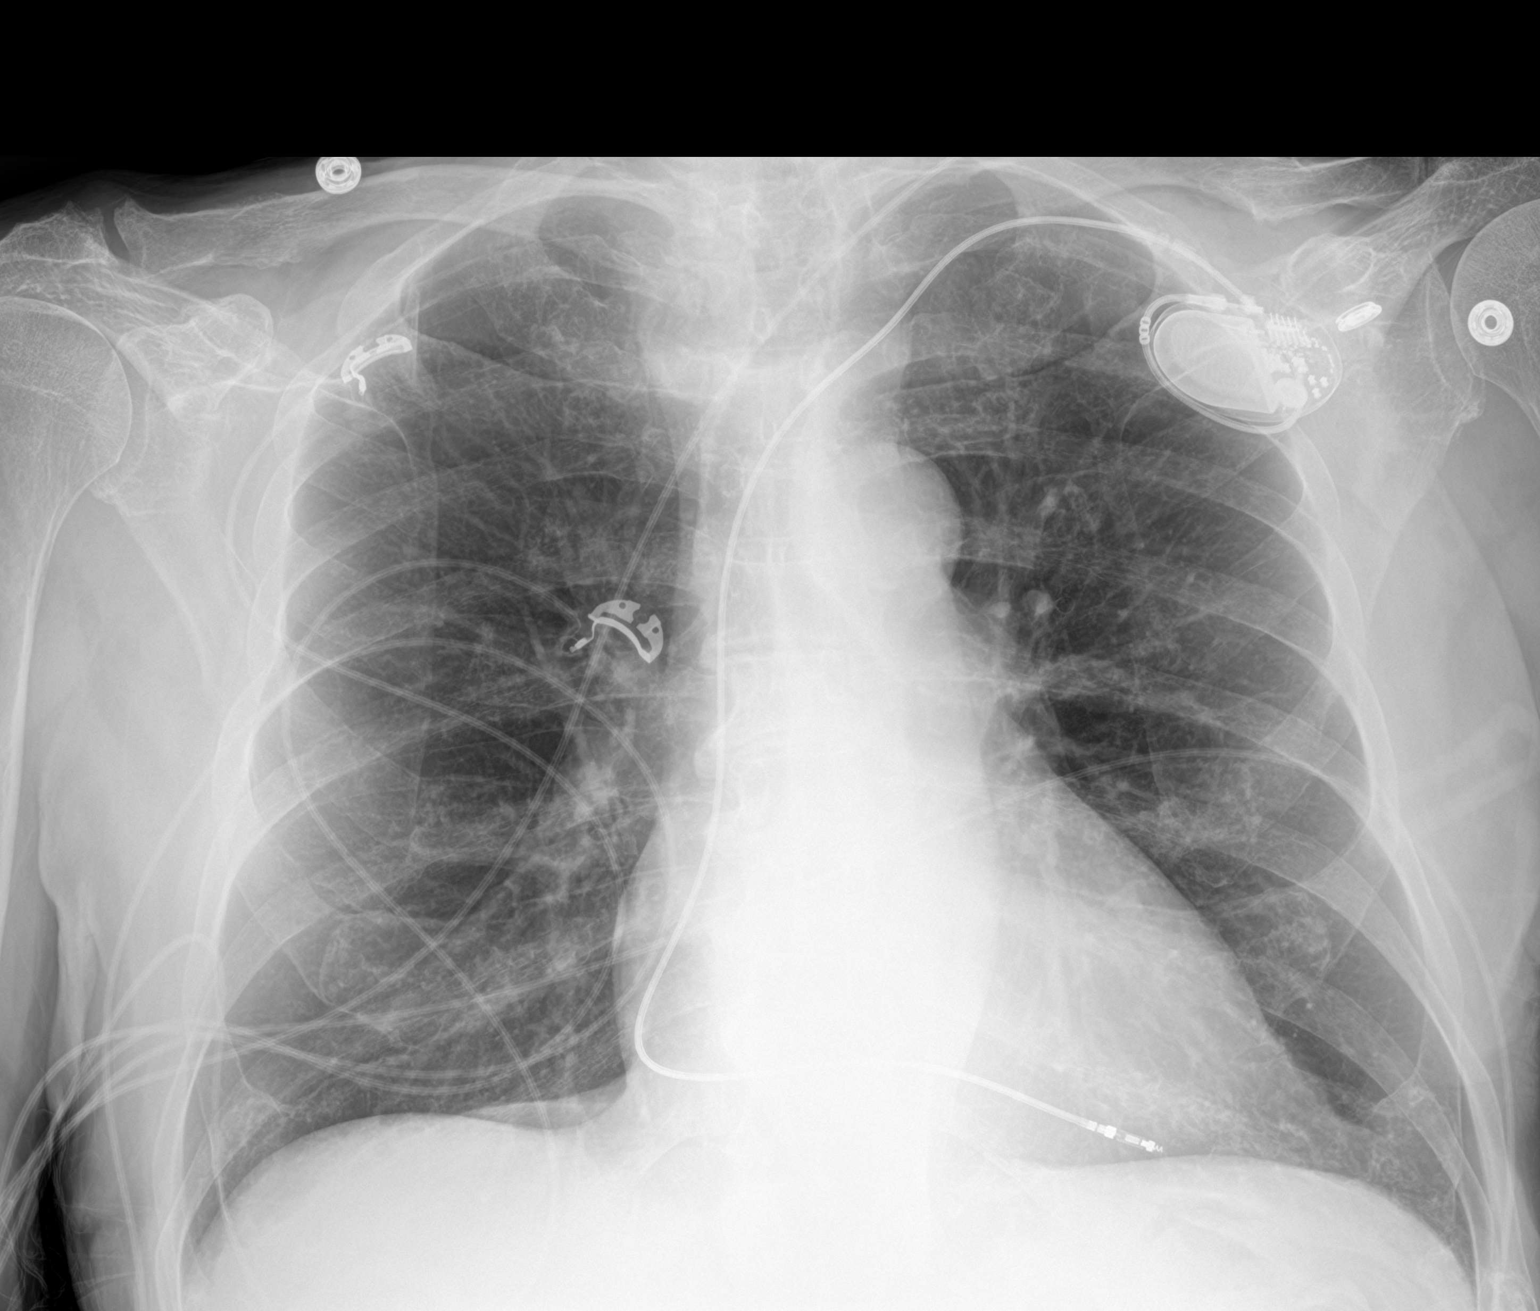

[2 of 2 positions shown; findings below may reference images not displayed]

FINDINGS: Single chamber pacer into the right ventricle. There is no edema,
consolidation, effusion, or pneumothorax. Stable heart size and
mediastinal contours which are normal.
IMPRESSION: No complicating feature after single chamber pacer implant.

## 2021-04-21 NOTE — Progress Notes (Signed)
Remote pacemaker transmission.   

## 2021-05-20 DIAGNOSIS — F32A Depression, unspecified: Secondary | ICD-10-CM | POA: Diagnosis not present

## 2021-05-20 DIAGNOSIS — Z6822 Body mass index (BMI) 22.0-22.9, adult: Secondary | ICD-10-CM | POA: Diagnosis not present

## 2021-05-20 DIAGNOSIS — G309 Alzheimer's disease, unspecified: Secondary | ICD-10-CM | POA: Diagnosis not present

## 2021-05-20 DIAGNOSIS — U071 COVID-19: Secondary | ICD-10-CM | POA: Diagnosis not present

## 2021-07-01 ENCOUNTER — Ambulatory Visit (INDEPENDENT_AMBULATORY_CARE_PROVIDER_SITE_OTHER): Payer: Medicare Other

## 2021-07-01 DIAGNOSIS — I495 Sick sinus syndrome: Secondary | ICD-10-CM | POA: Diagnosis not present

## 2021-07-01 LAB — CUP PACEART REMOTE DEVICE CHECK
Battery Remaining Longevity: 116 mo
Battery Remaining Percentage: 91 %
Battery Voltage: 3.02 V
Brady Statistic RV Percent Paced: 31 %
Date Time Interrogation Session: 20221117020016
Implantable Lead Implant Date: 20210517
Implantable Lead Location: 753860
Implantable Pulse Generator Implant Date: 20210517
Lead Channel Impedance Value: 510 Ohm
Lead Channel Pacing Threshold Amplitude: 0.5 V
Lead Channel Pacing Threshold Pulse Width: 0.5 ms
Lead Channel Sensing Intrinsic Amplitude: 3.2 mV
Lead Channel Setting Pacing Amplitude: 2.5 V
Lead Channel Setting Pacing Pulse Width: 0.5 ms
Lead Channel Setting Sensing Sensitivity: 0.7 mV
Pulse Gen Model: 1272
Pulse Gen Serial Number: 3800831

## 2021-07-12 DIAGNOSIS — I2510 Atherosclerotic heart disease of native coronary artery without angina pectoris: Secondary | ICD-10-CM | POA: Diagnosis not present

## 2021-07-12 DIAGNOSIS — G309 Alzheimer's disease, unspecified: Secondary | ICD-10-CM | POA: Diagnosis not present

## 2021-07-12 DIAGNOSIS — M1611 Unilateral primary osteoarthritis, right hip: Secondary | ICD-10-CM | POA: Diagnosis not present

## 2021-07-12 DIAGNOSIS — S72141D Displaced intertrochanteric fracture of right femur, subsequent encounter for closed fracture with routine healing: Secondary | ICD-10-CM | POA: Diagnosis not present

## 2021-07-12 DIAGNOSIS — Z09 Encounter for follow-up examination after completed treatment for conditions other than malignant neoplasm: Secondary | ICD-10-CM | POA: Diagnosis not present

## 2021-07-12 DIAGNOSIS — Z6821 Body mass index (BMI) 21.0-21.9, adult: Secondary | ICD-10-CM | POA: Diagnosis not present

## 2021-07-12 DIAGNOSIS — M1711 Unilateral primary osteoarthritis, right knee: Secondary | ICD-10-CM | POA: Diagnosis not present

## 2021-07-12 DIAGNOSIS — I4891 Unspecified atrial fibrillation: Secondary | ICD-10-CM | POA: Diagnosis not present

## 2021-07-12 DIAGNOSIS — R5383 Other fatigue: Secondary | ICD-10-CM | POA: Diagnosis not present

## 2021-07-12 DIAGNOSIS — F32A Depression, unspecified: Secondary | ICD-10-CM | POA: Diagnosis not present

## 2021-07-12 DIAGNOSIS — I959 Hypotension, unspecified: Secondary | ICD-10-CM | POA: Diagnosis not present

## 2021-07-12 NOTE — Progress Notes (Signed)
Remote pacemaker transmission.   

## 2021-07-14 DIAGNOSIS — I4891 Unspecified atrial fibrillation: Secondary | ICD-10-CM | POA: Diagnosis not present

## 2021-07-14 DIAGNOSIS — Z20822 Contact with and (suspected) exposure to covid-19: Secondary | ICD-10-CM | POA: Diagnosis not present

## 2021-07-14 DIAGNOSIS — I509 Heart failure, unspecified: Secondary | ICD-10-CM | POA: Diagnosis not present

## 2021-07-14 DIAGNOSIS — Z87891 Personal history of nicotine dependence: Secondary | ICD-10-CM | POA: Diagnosis not present

## 2021-07-14 DIAGNOSIS — Z950 Presence of cardiac pacemaker: Secondary | ICD-10-CM | POA: Diagnosis not present

## 2021-07-14 DIAGNOSIS — I110 Hypertensive heart disease with heart failure: Secondary | ICD-10-CM | POA: Diagnosis not present

## 2021-07-29 DIAGNOSIS — S299XXA Unspecified injury of thorax, initial encounter: Secondary | ICD-10-CM | POA: Diagnosis not present

## 2021-07-29 DIAGNOSIS — S0031XA Abrasion of nose, initial encounter: Secondary | ICD-10-CM | POA: Diagnosis not present

## 2021-07-29 DIAGNOSIS — I6782 Cerebral ischemia: Secondary | ICD-10-CM | POA: Diagnosis not present

## 2021-07-29 DIAGNOSIS — S0990XA Unspecified injury of head, initial encounter: Secondary | ICD-10-CM | POA: Diagnosis not present

## 2021-07-29 DIAGNOSIS — I509 Heart failure, unspecified: Secondary | ICD-10-CM | POA: Diagnosis not present

## 2021-07-29 DIAGNOSIS — I110 Hypertensive heart disease with heart failure: Secondary | ICD-10-CM | POA: Diagnosis not present

## 2021-07-29 DIAGNOSIS — G319 Degenerative disease of nervous system, unspecified: Secondary | ICD-10-CM | POA: Diagnosis not present

## 2021-07-29 DIAGNOSIS — S0081XA Abrasion of other part of head, initial encounter: Secondary | ICD-10-CM | POA: Diagnosis not present

## 2021-07-29 DIAGNOSIS — Z950 Presence of cardiac pacemaker: Secondary | ICD-10-CM | POA: Diagnosis not present

## 2021-07-29 DIAGNOSIS — I4891 Unspecified atrial fibrillation: Secondary | ICD-10-CM | POA: Diagnosis not present

## 2021-07-29 DIAGNOSIS — I700 Atherosclerosis of aorta: Secondary | ICD-10-CM | POA: Diagnosis not present

## 2021-07-29 DIAGNOSIS — Z87891 Personal history of nicotine dependence: Secondary | ICD-10-CM | POA: Diagnosis not present

## 2021-07-29 DIAGNOSIS — W01198A Fall on same level from slipping, tripping and stumbling with subsequent striking against other object, initial encounter: Secondary | ICD-10-CM | POA: Diagnosis not present

## 2021-07-29 DIAGNOSIS — S20211A Contusion of right front wall of thorax, initial encounter: Secondary | ICD-10-CM | POA: Diagnosis not present

## 2021-08-03 DIAGNOSIS — R269 Unspecified abnormalities of gait and mobility: Secondary | ICD-10-CM | POA: Diagnosis not present

## 2021-08-03 DIAGNOSIS — G309 Alzheimer's disease, unspecified: Secondary | ICD-10-CM | POA: Diagnosis not present

## 2021-08-03 DIAGNOSIS — Z6821 Body mass index (BMI) 21.0-21.9, adult: Secondary | ICD-10-CM | POA: Diagnosis not present

## 2021-08-03 DIAGNOSIS — J069 Acute upper respiratory infection, unspecified: Secondary | ICD-10-CM | POA: Diagnosis not present

## 2021-08-03 DIAGNOSIS — J40 Bronchitis, not specified as acute or chronic: Secondary | ICD-10-CM | POA: Diagnosis not present

## 2021-08-09 ENCOUNTER — Other Ambulatory Visit: Payer: Self-pay | Admitting: Cardiology

## 2021-08-10 NOTE — Telephone Encounter (Signed)
Prescription refill request for Eliquis received. Indication: Atrial fib Last office visit: 10/15/20  Olin Pia MD Scr: 1.97 on 07/14/21 Age: 87 Weight: 72.6kg  Based on above findings Eliquis 2.5mg  twice daily is the appropriate dose.  Refill approved.

## 2021-08-22 NOTE — Progress Notes (Deleted)
Cardiology Office Note Date:  08/22/2021  Patient ID:  Benjamin Mason, Benjamin Mason [birth year], MRN [id number] PCP:  Manon Hilding, MD  Cardiologist:  Dr. Domenic Polite Electrophysiologist: Dr. Caryl Comes  ***refresh   Chief Complaint: *** annual visit  History of Present Illness: Benjamin Mason is a 88 y.o. male with history of  CKD (III), CAD (non-obstructive disease by cath 2018, remote PCI to RCA, LAD 1995), HLD, PVCs, AFIb (permanent), dementia, symptomatic bradycardia w/PPM.  He comes in today to be seen for dr. Caryl Comes, last seen by him via telehealth visit March 2022.  He was doing well, no recurrent syncope or dizziness since PPM implant.  No changes made.  ER visit 07/14/21 at [facility] with a couple days of confusion/disorientation, CT head negative for acute findings, in d/w his PMD recommended starting aricept and discharged from the ER  LABS BUN/Creat 32/1.97 (looks his baseline) H/H 9.3/28.2  ER visit 07/29/21 with slip/fall, CT head negative, found with contusions abrasions, no fractures, injuries otherwise  *** falls?? A/c *** bleeding, eliquis, dose *** symptoms *** anemia *** CAD meds, mcdowell overdue  Device information Abbott single chamber PPM implanted 12/30/2019   Past Medical History:  Diagnosis Date   Anxiety    Aortic regurgitation    Mild to moderate   CKD (chronic kidney disease) stage 3, GFR 30-59 ml/min    Coronary atherosclerosis of native coronary artery    BMS to RCA and LAD 1995   Essential hypertension    Frequent PVCs    IBS (irritable bowel syndrome)    Lactose intolerance    Mitral regurgitation    Moderate   Mixed hyperlipidemia    Permanent atrial fibrillation ([facility])    Sick sinus syndrome [facility])    St. Jude single-chamber pacemaker May 2021 - Dr. Caryl Comes   Skin cancer     Past Surgical History:  Procedure Laterality Date   BALLOON DILATION  08/03/2012   Procedure: BALLOON DILATION;  Surgeon: Rogene Houston, MD;  [facility];   Service: Endoscopy;  Laterality: N/A;   COLONOSCOPY WITH ESOPHAGOGASTRODUODENOSCOPY (EGD)  08/03/2012   Procedure: COLONOSCOPY WITH ESOPHAGOGASTRODUODENOSCOPY (EGD);  Surgeon: Rogene Houston, MD;  [facility];  Service: Endoscopy;  Laterality: N/A;  215   LEFT HEART CATH AND CORONARY ANGIOGRAPHY N/A 11/18/2016   Procedure: Left Heart Cath and Coronary Angiography;  Surgeon: Peter M Martinique, MD;  Location: [facility] CV LAB;  Service: Cardiovascular;  Laterality: N/A;   LEFT INGUINAL HERNIA     MALONEY DILATION  08/03/2012   Procedure: MALONEY DILATION;  Surgeon: Rogene Houston, MD;  [facility];  Service: Endoscopy;  Laterality: N/A;   PACEMAKER IMPLANT N/A 12/30/2019   Procedure: PACEMAKER IMPLANT;  Surgeon: Deboraha Sprang, MD;  Location: [facility] CV LAB;  Service: Cardiovascular;  Laterality: N/A;   PENILE PROSTHESIS IMPLANT     SAVORY DILATION  08/03/2012   Procedure: SAVORY DILATION;  Surgeon: Rogene Houston, MD;  [facility];  Service: Endoscopy;  Laterality: N/A;    Current Outpatient Medications  Medication Sig Dispense Refill   acetaminophen (TYLENOL) 650 MG CR tablet Take 1,300 mg by mouth 2 (two) times daily.     Apoaequorin 10 MG CAPS Take 10 mg by mouth daily.      atenolol (TENORMIN) 50 MG tablet TAKE ONE TABLET BY MOUTH DAILY 30 tablet 6   brimonidine (ALPHAGAN) 0.15 % ophthalmic solution Place 1 drop into the right eye  2 (two) times daily. (Patient not taking: Reported on 10/15/2020)     cholecalciferol (VITAMIN D3) 25 MCG (1000 UT) tablet Take 1,000 Units by mouth daily.     dorzolamide-timolol (COSOPT) 22.3-6.8 MG/ML ophthalmic solution 1 drop 2 (two) times daily. (Patient not taking: Reported on 10/15/2020)     ELIQUIS 2.5 MG TABS tablet TAKE ONE TABLET BY MOUTH TWICE DAILY 60 tablet 6   finasteride (PROSCAR) 5 MG tablet Take 5 mg by mouth daily.       Lactase 9000 units CHEW Chew 1 tablet by mouth 3 (three) times daily as needed (when  eating dairy products).      melatonin 3 MG TABS tablet Take 3 mg by mouth at bedtime.     Multiple Vitamin (MULTIVITAMIN WITH MINERALS) TABS tablet Take 1 tablet by mouth daily.     nitroGLYCERIN (NITROSTAT) 0.4 MG SL tablet Place 1 tablet (0.4 mg total) under the tongue every 5 (five) minutes x 3 doses as needed for chest pain (if no relief after 3rd dose, proceed to the ED for an evaluation or call 911). PLACE ONE TABLET UNDER THE TONGUE EVERY 5 MINUTES AS NEEDED FOR CHEST PAIN 25 tablet 3   pantoprazole (PROTONIX) 40 MG tablet Take 40 mg by mouth daily.      prednisoLONE acetate (PRED FORTE) 1 % ophthalmic suspension Place 1 drop into the right eye at bedtime. (Patient not taking: Reported on 10/15/2020)     simvastatin (ZOCOR) 20 MG tablet Take 20 mg by mouth at bedtime.     tamsulosin (FLOMAX) 0.4 MG CAPS capsule Take 0.4 mg by mouth at bedtime.      testosterone cypionate (DEPOTESTOSTERONE CYPIONATE) 200 MG/ML injection Inject 140 mg into the muscle every 14 (fourteen) days. (Patient not taking: Reported on 10/15/2020)     triamcinolone cream (KENALOG) 0.1 % Apply 1 application topically daily as needed (wound care).      No current facility-administered medications for this visit.    Allergies:   Patient has no known allergies.   Social History:  The patient  reports that he quit smoking about 59 years ago. His smoking use included cigarettes. He has a 50.00 pack-year smoking history. He has never used smokeless tobacco. He reports that he does not drink alcohol and does not use drugs.   Family History:  The patient's family history includes Heart attack in his father.  ROS:  Please see the history of present illness.    All other systems are reviewed and otherwise negative.   PHYSICAL EXAM:  VS:  There were no vitals taken for this visit. BMI: There is no height or weight on file to calculate BMI. Well nourished, well developed, in no acute distress HEENT: normocephalic,  atraumatic Neck: no JVD, carotid bruits or masses Cardiac:  *** RRR; no significant murmurs, no rubs, or gallops Lungs:  *** CTA b/l, no wheezing, rhonchi or rales Abd: soft, nontender MS: no deformity or *** atrophy Ext: *** no edema Skin: warm and dry, no rash Neuro:  No gross deficits appreciated Psych: euthymic mood, full affect  *** PPM site is stable, no tethering or discomfort   EKG:  Done today and reviewed by myself shows  ***  Device interrogation done today and reviewed by myself:  ***   07/18/2019: lexiscan stress myoview There was no ST segment deviation noted during stress. Findings consistent with prior inferior/inferoseptal myocardial infarction. There is no current ischemia. This is an intermediate risk study. Risk based on  decreased LVEF, there is no current ischemia. Recommend correlating LVEF with echo. The left ventricular ejection fraction is mildly decreased (45-54%).     07/09/2019: TTE IMPRESSIONS  1. Left ventricular ejection fraction, by visual estimation, is 55 to  60%. The left ventricle has normal function. There is no left ventricular  hypertrophy.   2. Left ventricular diastolic parameters are indeterminate.   3. Global right ventricle has normal systolic function.The right  ventricular size is mildly enlarged. No increase in right ventricular wall  thickness.   4. Left atrial size was moderately dilated.   5. Right atrial size was moderately dilated.   6. Mild mitral annular calcification.   7. Mild to moderate aortic valve annular calcification.   8. The mitral valve is degenerative. Mild mitral valve regurgitation.   9. The tricuspid valve is grossly normal. Tricuspid valve regurgitation  is mild.  10. The aortic valve is tricuspid. Aortic valve regurgitation is mild.  Mild aortic valve sclerosis without stenosis.  11. The pulmonic valve was grossly normal. Pulmonic valve regurgitation is  mild.  12. Normal pulmonary artery systolic  pressure.  13. The tricuspid regurgitant velocity is 2.23 m/s, and with an assumed  right atrial pressure of 8 mmHg, the estimated right ventricular systolic  pressure is normal at 27.9 mmHg.  14. The inferior vena cava is normal in size with <50% respiratory  variability, suggesting right atrial pressure of 8 mmHg.        11/18/2016: LHC Ost RCA to Mid RCA lesion, 25 %stenosed. Dist RCA lesion, 45 %stenosed. Ost LAD to Prox LAD lesion, 0 %stenosed. Prox LAD to Mid LAD lesion, 35 %stenosed. LV end diastolic pressure is normal. Mid LAD to Dist LAD lesion, 35 %stenosed.   1. Nonobstructive CAD 2. Normal LVEDP   Recent Labs: No results found for requested labs within last 8760 hours.  No results found for requested labs within last 8760 hours.   CrCl cannot be calculated (Patient's most recent lab result is older than the maximum 21 days allowed.).   Wt Readings from Last 3 Encounters:  10/15/20 160 lb (72.6 kg)  04/07/20 171 lb 6.4 oz (77.7 kg)  03/18/20 173 lb (78.5 kg)     Other studies reviewed: Additional studies/records reviewed today include: summarized above  ASSESSMENT AND PLAN:  PPM ***  Permanent AFib CHA2DS2Vasc is 3, on Eliquis *** appropriately dosed *** rates  CAD ***   Disposition: F/u with ***  Current medicines are reviewed at length with the patient today.  The patient did not have any concerns regarding medicines.  Venetia Night, PA-C 08/22/2021 7:05 AM     [place] [address] [phone] (office)  [phone] (fax)

## 2021-08-23 ENCOUNTER — Encounter: Payer: Medicare Other | Admitting: Physician Assistant

## 2021-08-27 DIAGNOSIS — N183 Chronic kidney disease, stage 3 unspecified: Secondary | ICD-10-CM | POA: Diagnosis not present

## 2021-08-27 DIAGNOSIS — K2100 Gastro-esophageal reflux disease with esophagitis, without bleeding: Secondary | ICD-10-CM | POA: Diagnosis not present

## 2021-08-27 DIAGNOSIS — E7849 Other hyperlipidemia: Secondary | ICD-10-CM | POA: Diagnosis not present

## 2021-08-27 DIAGNOSIS — E782 Mixed hyperlipidemia: Secondary | ICD-10-CM | POA: Diagnosis not present

## 2021-08-27 DIAGNOSIS — E7800 Pure hypercholesterolemia, unspecified: Secondary | ICD-10-CM | POA: Diagnosis not present

## 2021-09-01 DIAGNOSIS — I2510 Atherosclerotic heart disease of native coronary artery without angina pectoris: Secondary | ICD-10-CM | POA: Diagnosis not present

## 2021-09-01 DIAGNOSIS — Z23 Encounter for immunization: Secondary | ICD-10-CM | POA: Diagnosis not present

## 2021-09-01 DIAGNOSIS — I482 Chronic atrial fibrillation, unspecified: Secondary | ICD-10-CM | POA: Diagnosis not present

## 2021-09-01 DIAGNOSIS — I10 Essential (primary) hypertension: Secondary | ICD-10-CM | POA: Diagnosis not present

## 2021-09-01 DIAGNOSIS — D638 Anemia in other chronic diseases classified elsewhere: Secondary | ICD-10-CM | POA: Diagnosis not present

## 2021-09-01 DIAGNOSIS — N189 Chronic kidney disease, unspecified: Secondary | ICD-10-CM | POA: Diagnosis not present

## 2021-09-01 DIAGNOSIS — G3184 Mild cognitive impairment, so stated: Secondary | ICD-10-CM | POA: Diagnosis not present

## 2021-09-01 DIAGNOSIS — E7849 Other hyperlipidemia: Secondary | ICD-10-CM | POA: Diagnosis not present

## 2021-09-16 ENCOUNTER — Telehealth: Payer: Self-pay

## 2021-09-16 NOTE — Telephone Encounter (Signed)
Alert remote reviewed. Normal device function.   No atrial lead.  Four HVRs, one was 34 minutes, most like AF with RVR.  Sent to triage. Next remote 09/30/2021.  Benjamin Breach, RN, CCDS, [employer]  Successful telephone encounter to patient however spoke with daughter Benjamin Mason (on [place]) as patient has dementia. Daughter states patient's atenolol was decreased by PCP from 50mg  to 25mg  07/14/22. Daughter also states patient has sitter 3 days a week who makes sure patient takes his medications however there may be some days patient does not. Also notes that all episodes happens in the am, prior to patient getting out of bed. Questioning if medication could be "wearing off" prior to taking daily med. Patient has not complained of symptoms. Continues to take eliquis. Appointment with Dr. Caryl Comes scheduled 10/04/21. Will route as FYI and update daughter if changes need to be made prior to 2/20 appointment.

## 2021-09-17 DIAGNOSIS — G309 Alzheimer's disease, unspecified: Secondary | ICD-10-CM | POA: Diagnosis not present

## 2021-09-17 DIAGNOSIS — R197 Diarrhea, unspecified: Secondary | ICD-10-CM | POA: Diagnosis not present

## 2021-09-17 DIAGNOSIS — Z6822 Body mass index (BMI) 22.0-22.9, adult: Secondary | ICD-10-CM | POA: Diagnosis not present

## 2021-09-21 NOTE — Telephone Encounter (Signed)
Pt is scheduled for an OV with Dr Caryl Comes 10/04/2021.

## 2021-09-30 ENCOUNTER — Ambulatory Visit (INDEPENDENT_AMBULATORY_CARE_PROVIDER_SITE_OTHER): Payer: Medicare Other

## 2021-09-30 DIAGNOSIS — I495 Sick sinus syndrome: Secondary | ICD-10-CM

## 2021-09-30 LAB — CUP PACEART REMOTE DEVICE CHECK
Battery Remaining Longevity: 115 mo
Battery Remaining Percentage: 89 %
Battery Voltage: 3.02 V
Brady Statistic RV Percent Paced: 30 %
Date Time Interrogation Session: 20230216020016
Implantable Lead Implant Date: 20210517
Implantable Lead Location: 753860
Implantable Pulse Generator Implant Date: 20210517
Lead Channel Impedance Value: 540 Ohm
Lead Channel Pacing Threshold Amplitude: 0.5 V
Lead Channel Pacing Threshold Pulse Width: 0.5 ms
Lead Channel Sensing Intrinsic Amplitude: 4 mV
Lead Channel Setting Pacing Amplitude: 2.5 V
Lead Channel Setting Pacing Pulse Width: 0.5 ms
Lead Channel Setting Sensing Sensitivity: 0.7 mV
Pulse Gen Model: 1272
Pulse Gen Serial Number: 3800831

## 2021-10-04 ENCOUNTER — Encounter: Payer: Self-pay | Admitting: Internal Medicine

## 2021-10-04 ENCOUNTER — Ambulatory Visit: Payer: Medicare Other | Admitting: Internal Medicine

## 2021-10-04 ENCOUNTER — Other Ambulatory Visit: Payer: Self-pay

## 2021-10-04 VITALS — BP 130/80 | HR 65 | Ht 72.0 in | Wt 157.0 lb

## 2021-10-04 DIAGNOSIS — I455 Other specified heart block: Secondary | ICD-10-CM | POA: Diagnosis not present

## 2021-10-04 DIAGNOSIS — I495 Sick sinus syndrome: Secondary | ICD-10-CM

## 2021-10-04 DIAGNOSIS — Z950 Presence of cardiac pacemaker: Secondary | ICD-10-CM | POA: Diagnosis not present

## 2021-10-04 NOTE — Patient Instructions (Signed)

## 2021-10-04 NOTE — Progress Notes (Signed)
Patient Care Team: Sasser, Silvestre Moment, MD as PCP - General (Cardiology) Satira Sark, MD as PCP - Cardiology (Cardiology)   HPI  Benjamin Mason is a 88 y.o. male Seen in followup for permanent atrial fib, PVCs and status post pacemaker-[employer] for sinus pauses 4-8 seconds and a history of presyncope.   The patient denies chest pain, shortness of breat, nocturnal dyspnea , orthopnea or peripheral edema.  There have been no palpitations, lightheadedness or syncope.  Memory is a major issue and he is no longer very ambulatory  .  DATE TEST EF    4/18 LHC    % CAD Nonobstructive  11/20 Echo   55-60 % BAE- mod  12/20 MYOVIEW 45-54%      Date Cr K Hgb  10/17 1.72 4.6    4/21 2.12 4.0 12  5/21 1.88 4.4 10.7  11/22 1.97 4.1 9.3  MCV>105 x 8+ years--B12 folate normal (scanned)  2013     Date PVCs  11/20 30.3%           Records and Results Reviewed   Past Medical History:  Diagnosis Date   Anxiety    Aortic regurgitation    Mild to moderate   CKD (chronic kidney disease) stage 3, GFR 30-59 ml/min (HCC)    Coronary atherosclerosis of native coronary artery    BMS to RCA and LAD 1995   Essential hypertension    Frequent PVCs    IBS (irritable bowel syndrome)    Lactose intolerance    Mitral regurgitation    Moderate   Mixed hyperlipidemia    Permanent atrial fibrillation (HCC)    Sick sinus syndrome [facility])    St[employer] Jude single-chamber pacemaker May 2021 - Dr. Caryl Comes   Skin cancer     Past Surgical History:  Procedure Laterality Date   BALLOON DILATION  08/03/2012   Procedure: BALLOON DILATION;  Surgeon: Rogene Houston, MD;  Location: [facility];  Service: Endoscopy;  Laterality: N/A;   COLONOSCOPY WITH ESOPHAGOGASTRODUODENOSCOPY (EGD)  08/03/2012   Procedure: COLONOSCOPY WITH ESOPHAGOGASTRODUODENOSCOPY (EGD);  Surgeon: Rogene Houston, MD;  Location: [facility];  Service: Endoscopy;  Laterality: N/A;  215   LEFT HEART CATH AND CORONARY  ANGIOGRAPHY N/A 11/18/2016   Procedure: Left Heart Cath and Coronary Angiography;  Surgeon: Peter M Martinique, MD;  Location: [place] [facility];  Service: Cardiovascular;  Laterality: N/A;   LEFT INGUINAL HERNIA     MALONEY DILATION  08/03/2012   Procedure: MALONEY DILATION;  Surgeon: Rogene Houston, MD;  Location: [facility];  Service: Endoscopy;  Laterality: N/A;   PACEMAKER IMPLANT N/A 12/30/2019   Procedure: PACEMAKER IMPLANT;  Surgeon: Deboraha Sprang, MD;  Location: [place] [facility];  Service: Cardiovascular;  Laterality: N/A;   PENILE PROSTHESIS IMPLANT     SAVORY DILATION  08/03/2012   Procedure: SAVORY DILATION;  Surgeon: Rogene Houston, MD;  Location: [facility];  Service: Endoscopy;  Laterality: N/A;    Current Meds  Medication Sig   acetaminophen (TYLENOL) 650 MG CR tablet Take 1,300 mg by mouth 2 (two) times daily.   Apoaequorin 10 MG CAPS Take 10 mg by mouth daily.    cholecalciferol (VITAMIN D3) 25 MCG (1000 UT) tablet Take 1,000 Units by mouth daily.   donepezil (ARICEPT) 10 MG tablet Take 10 mg by mouth at bedtime.   ELIQUIS 2.5 MG TABS tablet TAKE ONE TABLET BY MOUTH TWICE DAILY  fexofenadine (ALLEGRA) 180 MG tablet Take 180 mg by mouth daily.   finasteride (PROSCAR) 5 MG tablet Take 5 mg by mouth daily.     fluticasone (FLONASE) 50 MCG/ACT nasal spray Place into both nostrils daily.   melatonin 3 MG TABS tablet Take 3 mg by mouth at bedtime.   Multiple Vitamin (MULTIVITAMIN WITH MINERALS) TABS tablet Take 1 tablet by mouth daily.   nitroGLYCERIN (NITROSTAT) 0.4 MG SL tablet Place 1 tablet (0.4 mg total) under the tongue every 5 (five) minutes x 3 doses as needed for chest pain (if no relief after 3rd dose, proceed to the ED for an evaluation or call 911). PLACE ONE TABLET UNDER THE TONGUE EVERY 5 MINUTES AS NEEDED FOR CHEST PAIN   pantoprazole (PROTONIX) 40 MG tablet Take 40 mg by mouth daily.    sertraline (ZOLOFT) 25 MG tablet Take 25 mg by mouth every morning.    simvastatin (ZOCOR) 20 MG tablet Take 20 mg by mouth at bedtime.   tamsulosin (FLOMAX) 0.4 MG CAPS capsule Take 0.4 mg by mouth at bedtime.     No Known Allergies    Review of Systems negative except from HPI and PMH  Physical Exam BP 130/80    Pulse 65    Ht 6' (1.829 m)    Wt 157 lb (71.2 kg)    SpO2 97%    BMI 21.29 kg/m  Well developed and nourished in no acute distress HENT normal Neck supple with JVP-  flat  Clear Regular rate and rhythm, no murmurs or gallops Abd-soft with active BS No Clubbing cyanosis edema Skin-warm and dry A & Oriented  Grossly normal sensory and motor function  ECG atrial fibrillation with intermittent ventricular pacing at a mean rate of about 65 Intervals-/09/41   CrCl cannot be calculated (Patient's most recent lab result is older than the maximum 21 days allowed.).   Assessment and  Plan  PVCs   Afib permanent    CHF chronic diastolic   ? Cardiomyopathy   Renal insufficiency grade 3-4    Dementia   Pacemaker Abbott   Device function is normal.  We have reprogrammed his lower rate limit from 60--50 to try to decrease his ventricular pacing burden from his current 30%.  Blood pressure is well controlled.  His atenolol was decreased from 50--25 by Dr. Quintin Alto.  We will keep it at 25 not withstanding the few episodes of ventricular high rate noted particularly around the new year.  As they have not been significantly more, we will not adjust his atenolol at this time.  No bleeding.  We will continue him on Eliquis 2.5 twice daily.        Current medicines are reviewed at length with the patient today .  The patient does not  have concerns regarding medicines.

## 2021-10-05 ENCOUNTER — Telehealth: Payer: Self-pay | Admitting: Cardiology

## 2021-10-05 NOTE — Progress Notes (Signed)
Remote pacemaker transmission.   

## 2021-10-05 NOTE — Telephone Encounter (Signed)
Per daughter Maudie Mercury) - takes in the morning - usually around 10:00 am.

## 2021-10-05 NOTE — Telephone Encounter (Signed)
Patient did have OV with Dr. Caryl Comes yesterday.

## 2021-10-05 NOTE — Telephone Encounter (Signed)
STAT if patient feels like he/she is going to faint   Are you dizzy now? No, spoke with daughter. She said after lunch he felt better.   Do you feel faint or have you passed out? no  Do you have any other symptoms? Gets pale and weak.   Have you checked your HR and BP (record if available)? 111/60 HR 64   104/56 HR 60   120/? Daughter states his BP was normal   His blood pressure tends to be low in the morning.  That is when he tends to have the dizzy episodes.

## 2021-10-06 ENCOUNTER — Emergency Department (HOSPITAL_COMMUNITY)
Admission: EM | Admit: 2021-10-06 | Discharge: 2021-10-06 | Disposition: A | Discharge status: Home / Self Care | Payer: Medicare Other | Attending: Emergency Medicine | Admitting: Emergency Medicine

## 2021-10-06 ENCOUNTER — Emergency Department (HOSPITAL_COMMUNITY): Payer: Medicare Other

## 2021-10-06 ENCOUNTER — Other Ambulatory Visit: Payer: Self-pay

## 2021-10-06 ENCOUNTER — Encounter (HOSPITAL_COMMUNITY): Payer: Self-pay

## 2021-10-06 DIAGNOSIS — Z7901 Long term (current) use of anticoagulants: Secondary | ICD-10-CM | POA: Insufficient documentation

## 2021-10-06 DIAGNOSIS — F0390 Unspecified dementia without behavioral disturbance: Secondary | ICD-10-CM | POA: Insufficient documentation

## 2021-10-06 DIAGNOSIS — I951 Orthostatic hypotension: Secondary | ICD-10-CM | POA: Diagnosis not present

## 2021-10-06 DIAGNOSIS — Z950 Presence of cardiac pacemaker: Secondary | ICD-10-CM | POA: Diagnosis not present

## 2021-10-06 DIAGNOSIS — R55 Syncope and collapse: Secondary | ICD-10-CM | POA: Diagnosis not present

## 2021-10-06 DIAGNOSIS — R42 Dizziness and giddiness: Secondary | ICD-10-CM | POA: Diagnosis present

## 2021-10-06 LAB — BASIC METABOLIC PANEL
Anion gap: 6 (ref 5–15)
BUN: 42 mg/dL — ABNORMAL HIGH (ref 8–23)
CO2: 27 mmol/L (ref 22–32)
Calcium: 9.8 mg/dL (ref 8.9–10.3)
Chloride: 110 mmol/L (ref 98–111)
Creatinine, Ser: 1.93 mg/dL — ABNORMAL HIGH (ref 0.61–1.24)
GFR, Estimated: 33 mL/min — ABNORMAL LOW (ref >60)
Glucose, Bld: 117 mg/dL — ABNORMAL HIGH (ref 70–99)
Potassium: 4.5 mmol/L (ref 3.5–5.1)
Sodium: 143 mmol/L (ref 135–145)

## 2021-10-06 LAB — TROPONIN I (HIGH SENSITIVITY): Troponin I (High Sensitivity): 12 ng/L (ref <18)

## 2021-10-06 LAB — URINALYSIS, ROUTINE W REFLEX MICROSCOPIC
Bilirubin Urine: NEGATIVE
Glucose, UA: NEGATIVE mg/dL
Hgb urine dipstick: NEGATIVE
Ketones, ur: NEGATIVE mg/dL
Leukocytes,Ua: NEGATIVE
Nitrite: NEGATIVE
Protein, ur: NEGATIVE mg/dL
Specific Gravity, Urine: 1.018 (ref 1.005–1.030)
pH: 6 (ref 5.0–8.0)

## 2021-10-06 LAB — CBC
HCT: 33.7 % — ABNORMAL LOW (ref 39.0–52.0)
Hemoglobin: 10.4 g/dL — ABNORMAL LOW (ref 13.0–17.0)
MCH: 34.4 pg — ABNORMAL HIGH (ref 26.0–34.0)
MCHC: 30.9 g/dL (ref 30.0–36.0)
MCV: 111.6 fL — ABNORMAL HIGH (ref 80.0–100.0)
Platelets: 164 10*3/uL (ref 150–400)
RBC: 3.02 MIL/uL — ABNORMAL LOW (ref 4.22–5.81)
RDW: 12.6 % (ref 11.5–15.5)
WBC: 4 10*3/uL (ref 4.0–10.5)
nRBC: 0 % (ref 0.0–0.2)

## 2021-10-06 MED ORDER — SODIUM CHLORIDE 0.9 % IV BOLUS
500.0000 mL | Freq: Once | INTRAVENOUS | Status: AC
  Administered 2021-10-06: 500 mL via INTRAVENOUS

## 2021-10-06 MED ORDER — SODIUM CHLORIDE 0.9% FLUSH
3.0000 mL | Freq: Once | INTRAVENOUS | Status: AC
  Administered 2021-10-06: 3 mL via INTRAVENOUS

## 2021-10-06 MED ORDER — MIDODRINE HCL 2.5 MG PO TABS: 2.5000 mg | ORAL_TABLET | Freq: Two times a day (BID) | ORAL | 0 refills | Status: DC

## 2021-10-06 MED ORDER — ATENOLOL 25 MG PO TABS: 12.5000 mg | ORAL_TABLET | Freq: Every day | ORAL | 0 refills | Status: DC

## 2021-10-06 NOTE — ED Notes (Signed)
Patient back from CT.

## 2021-10-06 NOTE — ED Notes (Addendum)
Patient transported to CT 

## 2021-10-06 NOTE — ED Triage Notes (Signed)
BP low over last few days. Pt fell this morning and states he tripped, denies loc. BP was 84/57 after fall. Low BP worse in mornings. Pt has a pacemaker and recently saw cardiology and was told he some kind of tachycardia episodes. Denies cp or sob.

## 2021-10-06 NOTE — Discharge Instructions (Addendum)
You likely had a fall due to low blood pressure. I spoke with your cardiologist, Dr. Domenic Polite, who recommended starting midodrine 2.5 mg twice a day and decreasing atenolol to 12.5mg  daily (1/2 of what you are currently taking).  If you have chest pain, low blood pressure, another fall, please return to the emergency department for evaluation.

## 2021-10-06 NOTE — ED Provider Notes (Signed)
[facility] EMERGENCY DEPARTMENT Provider Note   CSN: [id number] Arrival date & time: 10/06/21  1137     History  Chief Complaint  Patient presents with   Dizziness    Benjamin Mason is a 88 y.o. male.  88 yo male presents after a fall at home. He reports that he was in his usual state of health this morning and went into the kitchen to eat his corn flakes. He believes that he was wearing his shoes and tripped over something and fell. He thinks he lightly hit his head, denies LOC. The fall was unwitnessed. His daughter reports that he has dementia and when she found him, he was not wearing shoes. She took his BP and it was 80s/50s. He has a history of sick sinus syndrome, he has a pacemaker which was interrogated on Monday with Dr. Caryl Comes and functioning appropriately. He is on atenolol and eliquis 2.5 mg qd due to known permanent afib. Rate controlled here today. He did not take his atenolol today as the fall and hypotensive episode occurred before he took his medication.       Home Medications Prior to Admission medications   Medication Sig Start Date End Date Taking? Authorizing Provider  acetaminophen (TYLENOL) 650 MG CR tablet Take 1,300 mg by mouth 2 (two) times daily.   Yes [provider]  Apoaequorin 10 MG CAPS Take 10 mg by mouth daily.    Yes [provider]  atenolol (TENORMIN) 25 MG tablet Take 25 mg by mouth daily. 10/05/21  Yes [provider]  cholecalciferol (VITAMIN D3) 25 MCG (1000 UT) tablet Take 1,000 Units by mouth daily.   Yes [provider]  donepezil (ARICEPT) 10 MG tablet Take 10 mg by mouth at bedtime. 09/07/21  Yes [provider]  ELIQUIS 2.5 MG TABS tablet TAKE ONE TABLET BY MOUTH TWICE DAILY 08/10/21  Yes Satira Sark, MD  fexofenadine (ALLEGRA) 180 MG tablet Take 180 mg by mouth daily.   Yes [provider]  finasteride (PROSCAR) 5 MG tablet Take 5 mg by mouth daily.     Yes [provider]  fluticasone (FLONASE) 50 MCG/ACT nasal spray Place into both nostrils daily.   Yes [provider]  melatonin 3 MG TABS tablet Take 3 mg by mouth at bedtime.   Yes [provider]  Multiple Vitamin (MULTIVITAMIN WITH MINERALS) TABS tablet Take 1 tablet by mouth daily.   Yes [provider]  nitroGLYCERIN (NITROSTAT) 0.4 MG SL tablet Place 1 tablet (0.4 mg total) under the tongue every 5 (five) minutes x 3 doses as needed for chest pain (if no relief after 3rd dose, proceed to the ED for an evaluation or call 911). PLACE ONE TABLET UNDER THE TONGUE EVERY 5 MINUTES AS NEEDED FOR CHEST PAIN 03/18/20  Yes Satira Sark, MD  pantoprazole (PROTONIX) 40 MG tablet Take 40 mg by mouth daily.  01/17/19  Yes [provider]  sertraline (ZOLOFT) 25 MG tablet Take 25 mg by mouth every morning. 09/07/21  Yes [provider]  simvastatin (ZOCOR) 20 MG tablet Take 20 mg by mouth at bedtime. 01/17/19  Yes [provider]  tamsulosin (FLOMAX) 0.4 MG CAPS capsule Take 0.4 mg by mouth at bedtime.    Yes [provider]      Allergies    Patient has no known allergies.    Review of Systems   Review of Systems  Constitutional: Negative.  Negative for activity  change, chills and fever.  HENT:  Negative for congestion, rhinorrhea and sore throat.   Respiratory:  Negative for cough, shortness of breath and wheezing.   Cardiovascular:  Negative for chest pain, palpitations and leg swelling.  Gastrointestinal:  Negative for abdominal distention and abdominal pain.   Physical Exam Updated Vital Signs BP (!) 150/74    Pulse 86    Temp 98.2 F (36.8 C) (Oral)    Resp 20    Ht 6' (1.829 m)    Wt 70.3 kg    SpO2 97%    BMI 21.02 kg/m  Physical Exam Vitals and nursing note reviewed.  Constitutional:      General: He is not in acute distress.    Appearance: Normal appearance. He is not ill-appearing, toxic-appearing or diaphoretic.  HENT:      Head: Normocephalic and atraumatic.     Nose: Nose normal.     Mouth/Throat:     Mouth: Mucous membranes are moist.     Pharynx: Oropharynx is clear. No oropharyngeal exudate or posterior oropharyngeal erythema.  Eyes:     General: No scleral icterus.    Extraocular Movements: Extraocular movements intact.     Conjunctiva/sclera: Conjunctivae normal.     Pupils: Pupils are equal, round, and reactive to light.  Neck:     Vascular: No carotid bruit.  Cardiovascular:     Rate and Rhythm: Normal rate and regular rhythm.     Pulses: Normal pulses.     Heart sounds: Normal heart sounds.  Pulmonary:     Effort: Pulmonary effort is normal.     Breath sounds: Normal breath sounds.  Abdominal:     General: Abdomen is flat. Bowel sounds are normal. There is no distension.     Palpations: Abdomen is soft.     Tenderness: There is no abdominal tenderness. There is no guarding.  Musculoskeletal:        General: Normal range of motion.     Cervical back: Normal range of motion and neck supple. No tenderness.     Right lower leg: No edema.     Left lower leg: No edema.  Lymphadenopathy:     Cervical: No cervical adenopathy.  Skin:    General: Skin is warm and dry.     Capillary Refill: Capillary refill takes less than 2 seconds.  Neurological:     General: No focal deficit present.     Mental Status: He is alert. Mental status is at baseline.     Comments: Alert and oriented x2, at baseline, knows unable to say day or month, thinks it is 2022 Cranial Nerves II: PERRL.  III,IV, VI: EOMI without ptosis or diplopia.  V: Facial sensation is symmetric to touch VII: Facial movement is symmetric.  VIII: Hearing is intact to voice X: Palate elevates symmetrically XI: Shoulder shrug is symmetric. XII: Tongue is midline without atrophy or fasciculations.  Motor: Tone is normal. Bulk is normal. 5/5 strength was present in all four extremities.  Sensory: Sensation is symmetric to light touch in  the arms and legs. Deep Tendon Reflexes: 2+ and symmetric in the biceps and patellae.    Psychiatric:        Mood and Affect: Mood normal.        Behavior: Behavior normal.    ED Results / Procedures / Treatments   Labs (all labs ordered are listed, but only abnormal results are displayed) Labs Reviewed  BASIC METABOLIC PANEL - Abnormal; Notable for the following  components:      Result Value   Glucose, Bld 117 (*)    BUN 42 (*)    Creatinine, Ser 1.93 (*)    GFR, Estimated 33 (*)    All other components within normal limits  CBC - Abnormal; Notable for the following components:   RBC 3.02 (*)    Hemoglobin 10.4 (*)    HCT 33.7 (*)    MCV 111.6 (*)    MCH 34.4 (*)    All other components within normal limits  URINALYSIS, ROUTINE W REFLEX MICROSCOPIC  TROPONIN I (HIGH SENSITIVITY)    EKG EKG Interpretation  Date/Time:  Wednesday October 06 2021 12:11:59 EST Ventricular Rate:  80 PR Interval:    QRS Duration: 88 QT Interval:  366 QTC Calculation: 422 R Axis:   -25 Text Interpretation: Atrial fibrillation Possible Anterior infarct , age undetermined Abnormal ECG When compared with ECG of 31-Dec-2019 04:38, Premature ventricular complexes are no longer Present QT has shortened Confirmed by Elnora Morrison [phone]) on 10/06/2021 12:56:06 PM  Radiology CT Head Wo Contrast  Result Date: 10/06/2021 CLINICAL DATA:  Head trauma, dementia, hypotension over past few days, fell this morning, denies loss of consciousness, on blood thinners EXAM: CT HEAD WITHOUT CONTRAST TECHNIQUE: Contiguous axial images were obtained from the base of the skull through the vertex without intravenous contrast. RADIATION DOSE REDUCTION: This exam was performed according to the departmental dose-optimization program which includes automated exposure control, adjustment of the mA and/or kV according to patient size and/or use of iterative reconstruction technique. COMPARISON:  07/29/2021 FINDINGS: Brain:  Generalized atrophy. Normal ventricular morphology. No midline shift or mass effect. Small vessel chronic ischemic changes of deep cerebral white matter. No intracranial hemorrhage, mass lesion, evidence of acute infarction, or extra-axial fluid collection. Vascular: No hyperdense vessels. Atherosclerotic calcification of internal carotid arteries at skull base Skull: Intact Sinuses/Orbits: Clear Other: N/A IMPRESSION: Generalized atrophy with small vessel chronic ischemic changes of deep cerebral white matter. No acute intracranial abnormalities. Electronically Signed   By: Lavonia Dana M.D.   On: 10/06/2021 14:11    Procedures Procedures    Medications Ordered in ED Medications  sodium chloride flush (NS) 0.9 % injection 3 mL (3 mLs Intravenous Given 10/06/21 1336)  sodium chloride 0.9 % bolus 500 mL (500 mLs Intravenous New Bag/Given 10/06/21 1335)    ED Course/ Medical Decision Making/ A&P Clinical Course as of 10/06/21 1531  Wed Oct 06, 2021  1459 I spoke with Dr Domenic Polite, patient's cardiologist, who recommends orthostatic vitals, then treating with midodrine 2.5mg  and decreasing atenolol to 12.5 mg. Then follow up in clinic if feeling well after ambulating. [CM]  1530 Patient able to ambulate without problem, orthostatics positive. Will start midodrine 2.5 and decrease atenolol as discussed with cardiology. Patient and daughter are in agreement with plan. VSS, safe for discharge home with strict return precautions. [CM]    Clinical Course User Index [CM] Gladys Damme, MD                           Medical Decision Making 88 yo male presents after fall. Because he has blood thinners and believes he hit his head, will obtain CT head. No focal neuro findings. Patient has had some intermittent diarrhea in the last two weeks, not yesterday though. His BP was notably low at home (daughter is nurse) and BP was 80s/50s, has been improved in the ED to 110s/50s-60s. CXR was clear and  he had no URI  symptoms. Hgb is at baseline, serum creatinine at baseline, no other remarkable findings on CBC or CMP. S/p 500cc NS bolus. CT head with no acute findings. Will road test patient and discuss with cardiology to get into clinic for closer follow up. Suspect orthostatic hypotension as likely cause of fall.  Amount and/or Complexity of Data Reviewed Independent Historian: caregiver    Details: daughter External Data Reviewed: notes. Labs: ordered. Decision-making details documented in ED Course. Radiology: ordered. Decision-making details documented in ED Course.  Risk Prescription drug management.          Final Clinical Impression(s) / ED Diagnoses Final diagnoses:  Orthostatic hypotension    Rx / DC Orders ED Discharge Orders     None         Gladys Damme, MD 10/06/21 1531    Elnora Morrison, MD 10/10/21 [phone]

## 2021-10-06 NOTE — ED Notes (Signed)
Patient ambulated in hall without difficulty, no complaints of dizziness or nausea noted.

## 2021-10-06 NOTE — ED Notes (Signed)
Advised patient we needed urine specimen.  Urinal left at bedside.

## 2021-10-07 ENCOUNTER — Ambulatory Visit (INDEPENDENT_AMBULATORY_CARE_PROVIDER_SITE_OTHER): Payer: Medicare Other | Admitting: Cardiology

## 2021-10-07 ENCOUNTER — Encounter: Payer: Self-pay | Admitting: Cardiology

## 2021-10-07 VITALS — BP 120/58 | HR 64 | Ht 72.0 in | Wt 162.0 lb

## 2021-10-07 DIAGNOSIS — I951 Orthostatic hypotension: Secondary | ICD-10-CM | POA: Diagnosis not present

## 2021-10-07 DIAGNOSIS — G909 Disorder of the autonomic nervous system, unspecified: Secondary | ICD-10-CM | POA: Diagnosis not present

## 2021-10-07 DIAGNOSIS — I4821 Permanent atrial fibrillation: Secondary | ICD-10-CM

## 2021-10-07 NOTE — Telephone Encounter (Signed)
Spoke with daughter Maudie Mercury) - stated that she did have to take him to ED yesterday & medication changes were made.  He is not due for f/u till May.  She is concerned that he needs to be seen sooner.  Please advise when you would like to see him again.  You have nothing available till May 4 in [place] office outside of new patient slots.

## 2021-10-07 NOTE — Progress Notes (Signed)
Cardiology Office Note  Date: 10/07/2021   ID: Benjamin Mason, DOB 06/07/1931, MRN [id number]  PCP:  Benjamin Hilding, MD  Cardiologist:  Benjamin Lesches, MD Electrophysiologist:  None   Chief Complaint  Patient presents with   Cardiac follow-up    History of Present Illness: Benjamin Mason is an 88 y.o. male most recently followed in the office by Dr. Caryl Mason, I reviewed the office note from February 20.  He is here today for a follow-up visit with his wife and daughter.  He has had recent symptomatic hypotension/orthostasis, ultimately a fall resulting in ER visit at [facility] yesterday.  I discussed the case with the ER provider at which point we started midodrine 2.5 mg twice daily and reduced his atenolol to 12.5 mg daily for now.  Today's blood pressure is much better, he is not orthostatic, and does not report any dizziness.  I suspect that he has autonomic dysfunction.  He has a St. Jude pacemaker in place with follow-up by Dr. Caryl Mason, history of sinus node dysfunction and symptomatic pauses.  He continues on Eliquis for stroke prophylaxis, recent lab work reviewed.  No spontaneous bleeding problems.  Head CT yesterday was negative for acute findings.  Past Medical History:  Diagnosis Date   Anxiety    Aortic regurgitation    Mild to moderate   CKD (chronic kidney disease) stage 3, GFR 30-59 ml/min (HCC)    Coronary atherosclerosis of native coronary artery    BMS to RCA and LAD 1995   Essential hypertension    Frequent PVCs    IBS (irritable bowel syndrome)    Lactose intolerance    Mitral regurgitation    Moderate   Mixed hyperlipidemia    Permanent atrial fibrillation [facility])    Sick sinus syndrome [facility])    St. Jude single-chamber pacemaker May 2021 - Dr. Caryl Mason   Skin cancer     Past Surgical History:  Procedure Laterality Date   BALLOON DILATION  08/03/2012   Procedure: BALLOON DILATION;  Surgeon: Rogene Houston, MD;  [facility];  Service:  Endoscopy;  Laterality: N/A;   COLONOSCOPY WITH ESOPHAGOGASTRODUODENOSCOPY (EGD)  08/03/2012   Procedure: COLONOSCOPY WITH ESOPHAGOGASTRODUODENOSCOPY (EGD);  Surgeon: Rogene Houston, MD;  [facility];  Service: Endoscopy;  Laterality: N/A;  215   LEFT HEART CATH AND CORONARY ANGIOGRAPHY N/A 11/18/2016   Procedure: Left Heart Cath and Coronary Angiography;  Surgeon: Peter M Martinique, MD;  Location: [place] CV LAB;  Service: Cardiovascular;  Laterality: N/A;   LEFT INGUINAL HERNIA     MALONEY DILATION  08/03/2012   Procedure: MALONEY DILATION;  Surgeon: Rogene Houston, MD;  [facility];  Service: Endoscopy;  Laterality: N/A;   PACEMAKER IMPLANT N/A 12/30/2019   Procedure: PACEMAKER IMPLANT;  Surgeon: Deboraha Sprang, MD;  Location: [place] CV LAB;  Service: Cardiovascular;  Laterality: N/A;   PENILE PROSTHESIS IMPLANT     SAVORY DILATION  08/03/2012   Procedure: SAVORY DILATION;  Surgeon: Rogene Houston, MD;  [facility];  Service: Endoscopy;  Laterality: N/A;    Current Outpatient Medications  Medication Sig Dispense Refill   acetaminophen (TYLENOL) 650 MG CR tablet Take 1,300 mg by mouth 2 (two) times daily.     Apoaequorin 10 MG CAPS Take 10 mg by mouth daily.      atenolol (TENORMIN) 25 MG tablet Take 0.5 tablets (12.5 mg total) by mouth daily. 30 tablet  0   cholecalciferol (VITAMIN D3) 25 MCG (1000 UT) tablet Take 1,000 Units by mouth daily.     donepezil (ARICEPT) 10 MG tablet Take 10 mg by mouth at bedtime.     ELIQUIS 2.5 MG TABS tablet TAKE ONE TABLET BY MOUTH TWICE DAILY 60 tablet 6   fexofenadine (ALLEGRA) 180 MG tablet Take 180 mg by mouth daily.     finasteride (PROSCAR) 5 MG tablet Take 5 mg by mouth daily.       fluticasone (FLONASE) 50 MCG/ACT nasal spray Place into both nostrils daily.     melatonin 3 MG TABS tablet Take 3 mg by mouth at bedtime.     midodrine (PROAMATINE) 2.5 MG tablet Take 1 tablet (2.5 mg total) by mouth 2 (two)  times daily with a meal. 60 tablet 0   Multiple Vitamin (MULTIVITAMIN WITH MINERALS) TABS tablet Take 1 tablet by mouth daily.     nitroGLYCERIN (NITROSTAT) 0.4 MG SL tablet Place 1 tablet (0.4 mg total) under the tongue every 5 (five) minutes x 3 doses as needed for chest pain (if no relief after 3rd dose, proceed to the ED for an evaluation or call 911). PLACE ONE TABLET UNDER THE TONGUE EVERY 5 MINUTES AS NEEDED FOR CHEST PAIN 25 tablet 3   pantoprazole (PROTONIX) 40 MG tablet Take 40 mg by mouth daily.      sertraline (ZOLOFT) 25 MG tablet Take 25 mg by mouth every morning.     simvastatin (ZOCOR) 20 MG tablet Take 20 mg by mouth at bedtime.     tamsulosin (FLOMAX) 0.4 MG CAPS capsule Take 0.4 mg by mouth at bedtime.      No current facility-administered medications for this visit.   Allergies:  Patient has no known allergies.   ROS: No palpitations or chest pain.  Physical Exam: VS:  BP (!) 120/58    Pulse 64    Ht 6' (1.829 m)    Wt 162 lb (73.5 kg)    SpO2 97%    BMI 21.97 kg/m , BMI Body mass index is 21.97 kg/m.  Wt Readings from Last 3 Encounters:  10/07/21 162 lb (73.5 kg)  2021/10/31 155 lb (70.3 kg)  10/04/21 157 lb (71.2 kg)    General: Patient appears comfortable at rest.  Using a cane. HEENT: Conjunctiva and lids normal, wearing a mask. Neck: Supple, no elevated JVP or carotid bruits, no thyromegaly. Lungs: Clear to auscultation, nonlabored breathing at rest. Cardiac: Irregularly irregular, no S3, 1/6 systolic murmur. Extremities: No pitting edema.  ECG:  An ECG dated 2021/10/31 was personally reviewed today and demonstrated:  Atrial fibrillation at 60 bpm.  Recent Labwork: October 31, 2021: BUN 42; Creatinine, Ser 1.93; Hemoglobin 10.4; Platelets 164; Potassium 4.5; Sodium 143   Other Studies Reviewed Today:  Head CT 10-31-21: IMPRESSION: Generalized atrophy with small vessel chronic ischemic changes of deep cerebral white matter.   No acute intracranial  abnormalities.  Assessment and Plan:  1.  Symptomatic hypotension and orthostasis likely related to autonomic dysfunction.  Now on midodrine 2.5 mg twice daily with stable blood pressure today.  Atenolol was concurrently reduced to 12.5 mg daily for now, however if blood pressure remains stable and his heart rate increases in atrial fibrillation, we will plan to go back up to 25 mg daily.  2.  Permanent atrial fibrillation with CHA2DS2-VASc score of 3-4.  Continue Eliquis 2.5 mg twice daily for stroke prophylaxis, recent lab work reviewed.  3.  Sinus node dysfunction with symptomatic  pauses status post St. Jude pacemaker, keep follow-up with Dr. Caryl Mason.  Medication Adjustments/Labs and Tests Ordered: Current medicines are reviewed at length with the patient today.  Concerns regarding medicines are outlined above.   Tests Ordered: No orders of the defined types were placed in this encounter.   Medication Changes: No orders of the defined types were placed in this encounter.   Disposition:  Follow up  May as scheduled.  Signed, Satira Sark, MD, [facility] 10/07/2021 1:43 PM    [place] at [facility] [address] [place], [place] [address] Phone: [phone]; Fax: [phone]

## 2021-10-07 NOTE — Patient Instructions (Signed)
Medication Instructions:  Your physician recommends that you continue on your current medications as directed. Please refer to the Current Medication list given to you today.   Labwork: None today  Testing/Procedures: None today  Follow-Up: Keep May appointment  Any Other Special Instructions Will Be Listed Below (If Applicable).    If you need a refill on your cardiac medications before your next appointment, please call your pharmacy.

## 2021-10-07 NOTE — Telephone Encounter (Signed)
Daughter Maudie Mercury) notified -- OV given for this evening per SM.

## 2021-10-21 ENCOUNTER — Telehealth: Payer: Self-pay | Admitting: Cardiology

## 2021-10-21 NOTE — Telephone Encounter (Signed)
Pt c/o BP issue: STAT if pt c/o blurred vision, one-sided weakness or slurred speech ? ?1. What are your last 5 BP readings? 137/77;74; 96/69; 89; 92/58; 62; 92/61; 65 ? ?2. Are you having any other symptoms (ex. Dizziness, headache, blurred vision, passed out)? Dizziness  ? ?3. What is your BP issue? Patient is suffering with hypotension. Please call back to discuss  ?

## 2021-10-21 NOTE — Telephone Encounter (Signed)
Pt daughter states that pt reports being dizzy in the morning. No other symptoms at this time. Please advise.  ?

## 2021-10-22 MED ORDER — MIDODRINE HCL 5 MG PO TABS: 5.0000 mg | ORAL_TABLET | Freq: Two times a day (BID) | ORAL | 3 refills | Status: DC

## 2021-10-22 NOTE — Telephone Encounter (Signed)
Daughter Maudie Mercury notified and order placed.  ?

## 2021-12-09 DIAGNOSIS — H40053 Ocular hypertension, bilateral: Secondary | ICD-10-CM | POA: Diagnosis not present

## 2021-12-14 ENCOUNTER — Ambulatory Visit: Payer: Medicare Other | Admitting: Cardiology

## 2021-12-14 ENCOUNTER — Encounter: Payer: Self-pay | Admitting: Cardiology

## 2021-12-14 VITALS — BP 104/58 | HR 72 | Ht 72.0 in | Wt 160.6 lb

## 2021-12-14 DIAGNOSIS — I951 Orthostatic hypotension: Secondary | ICD-10-CM | POA: Diagnosis not present

## 2021-12-14 DIAGNOSIS — N1832 Chronic kidney disease, stage 3b: Secondary | ICD-10-CM | POA: Diagnosis not present

## 2021-12-14 DIAGNOSIS — I4821 Permanent atrial fibrillation: Secondary | ICD-10-CM

## 2021-12-14 NOTE — Patient Instructions (Addendum)
Medication Instructions:   Your physician recommends that you continue on your current medications as directed. Please refer to the Current Medication list given to you today.  Labwork:  none  Testing/Procedures:  none  Follow-Up:  Your physician recommends that you schedule a follow-up appointment in: 3 months.  Any Other Special Instructions Will Be Listed Below (If Applicable).  If you need a refill on your cardiac medications before your next appointment, please call your pharmacy. 

## 2021-12-14 NOTE — Progress Notes (Signed)
? ? ?Cardiology Office Note ? ?Date: 12/14/2021  ? ?ID: Benjamin Mason, DOB 1931/06/19, MRN [id number] ? ?PCP:  Manon Hilding, MD  ?Cardiologist:  Rozann Lesches, MD ?Electrophysiologist:  None  ? ?Chief Complaint  ?Patient presents with  ? Cardiac follow-up  ? ? ?History of Present Illness: ?Benjamin Mason is an 88 y.o. male last seen in February.  He is here today with his wife and daughter.  Since last encounter he has come off of atenolol and Flomax due to hypotension.  I went over his home blood pressure checks which do look better subsequently.  Heart rate no faster than the 90s in atrial fibrillation.  He has felt better in general. ? ?St. Jude pacemaker in place with follow-up by Dr. Caryl Comes.  Device interrogation in February revealed normal function. ? ?I reviewed the remainder of his medications as noted below. ? ?Past Medical History:  ?Diagnosis Date  ? Anxiety   ? Aortic regurgitation   ? Mild to moderate  ? CKD (chronic kidney disease) stage 3, GFR 30-59 ml/min (HCC)   ? Coronary atherosclerosis of native coronary artery   ? BMS to RCA and LAD 1995  ? Essential hypertension   ? Frequent PVCs   ? IBS (irritable bowel syndrome)   ? Lactose intolerance   ? Mitral regurgitation   ? Moderate  ? Mixed hyperlipidemia   ? Permanent atrial fibrillation ([place])   ? Sick sinus syndrome ([place])   ? St. Jude single-chamber pacemaker May 2021 - Dr. Caryl Comes  ? Skin cancer   ? ? ?Past Surgical History:  ?Procedure Laterality Date  ? BALLOON DILATION  08/03/2012  ? Procedure: BALLOON DILATION;  Surgeon: Rogene Houston, MD;  [facility];  Service: Endoscopy;  Laterality: N/A;  ? COLONOSCOPY WITH ESOPHAGOGASTRODUODENOSCOPY (EGD)  08/03/2012  ? Procedure: COLONOSCOPY WITH ESOPHAGOGASTRODUODENOSCOPY (EGD);  Surgeon: Rogene Houston, MD;  [facility];  Service: Endoscopy;  Laterality: N/A;  215  ? LEFT HEART CATH AND CORONARY ANGIOGRAPHY N/A 11/18/2016  ? Procedure: Left Heart Cath and Coronary  Angiography;  Surgeon: Peter M Martinique, MD;  Location: [place] CV LAB;  Service: Cardiovascular;  Laterality: N/A;  ? LEFT INGUINAL HERNIA    ? MALONEY DILATION  08/03/2012  ? Procedure: MALONEY DILATION;  Surgeon: Rogene Houston, MD;  [facility];  Service: Endoscopy;  Laterality: N/A;  ? PACEMAKER IMPLANT N/A 12/30/2019  ? Procedure: PACEMAKER IMPLANT;  Surgeon: Deboraha Sprang, MD;  Location: [place] CV LAB;  Service: Cardiovascular;  Laterality: N/A;  ? PENILE PROSTHESIS IMPLANT    ? SAVORY DILATION  08/03/2012  ? Procedure: SAVORY DILATION;  Surgeon: Rogene Houston, MD;  [facility];  Service: Endoscopy;  Laterality: N/A;  ? ? ?Current Outpatient Medications  ?Medication Sig Dispense Refill  ? acetaminophen (TYLENOL) 650 MG CR tablet Take 1,300 mg by mouth 2 (two) times daily.    ? Apoaequorin 10 MG CAPS Take 10 mg by mouth daily.     ? cholecalciferol (VITAMIN D3) 25 MCG (1000 UT) tablet Take 1,000 Units by mouth daily.    ? donepezil (ARICEPT) 10 MG tablet Take 10 mg by mouth at bedtime.    ? ELIQUIS 2.5 MG TABS tablet TAKE ONE TABLET BY MOUTH TWICE DAILY 60 tablet 6  ? fexofenadine (ALLEGRA) 180 MG tablet Take 180 mg by mouth daily.    ? finasteride (PROSCAR) 5 MG tablet Take 5 mg by mouth  daily.      ? fluticasone (FLONASE) 50 MCG/ACT nasal spray Place into both nostrils daily.    ? melatonin 3 MG TABS tablet Take 3 mg by mouth at bedtime.    ? midodrine (PROAMATINE) 5 MG tablet Take 1 tablet (5 mg total) by mouth 2 (two) times daily with a meal. 180 tablet 3  ? Multiple Vitamin (MULTIVITAMIN WITH MINERALS) TABS tablet Take 1 tablet by mouth daily.    ? nitroGLYCERIN (NITROSTAT) 0.4 MG SL tablet Place 1 tablet (0.4 mg total) under the tongue every 5 (five) minutes x 3 doses as needed for chest pain (if no relief after 3rd dose, proceed to the ED for an evaluation or call 911). PLACE ONE TABLET UNDER THE TONGUE EVERY 5 MINUTES AS NEEDED FOR CHEST PAIN 25 tablet 3  ?  pantoprazole (PROTONIX) 40 MG tablet Take 40 mg by mouth daily.     ? sertraline (ZOLOFT) 25 MG tablet Take 25 mg by mouth every morning.    ? simvastatin (ZOCOR) 20 MG tablet Take 20 mg by mouth at bedtime.    ? ?No current facility-administered medications for this visit.  ? ?Allergies:  Patient has no known allergies.  ? ?ROS: No frank syncope.  No chest pain. ? ?Physical Exam: ?VS:  BP (!) 104/58   Pulse 72   Ht 6' (1.829 m)   Wt 160 lb 9.6 oz (72.8 kg)   SpO2 97%   BMI 21.78 kg/m? , BMI Body mass index is 21.78 kg/m?. ? ?Wt Readings from Last 3 Encounters:  ?12/14/21 160 lb 9.6 oz (72.8 kg)  ?10/07/21 162 lb (73.5 kg)  ?10/06/21 155 lb (70.3 kg)  ?  ?General: Patient appears comfortable at rest. ?HEENT: Conjunctiva and lids normal. ?Neck: Supple, no elevated JVP or carotid bruits, no thyromegaly. ?Lungs: Clear to auscultation, nonlabored breathing at rest. ?Cardiac: Irregularly irregular, no S3, 1/6 systolic murmur, no pericardial rub. ?Extremities: No pitting edema. ? ?ECG:  An ECG dated 10/06/2021 was personally reviewed today and demonstrated:  Atrial fibrillation. ? ?Recent Labwork: ?10/06/2021: BUN 42; Creatinine, Ser 1.93; Hemoglobin 10.4; Platelets 164; Potassium 4.5; Sodium 143  ? ?Other Studies Reviewed Today: ? ?Echocardiogram 07/09/2019: ? 1. Left ventricular ejection fraction, by visual estimation, is 55 to  ?60%. The left ventricle has normal function. There is no left ventricular  ?hypertrophy.  ? 2. Left ventricular diastolic parameters are indeterminate.  ? 3. Global right ventricle has normal systolic function.The right  ?ventricular size is mildly enlarged. No increase in right ventricular wall  ?thickness.  ? 4. Left atrial size was moderately dilated.  ? 5. Right atrial size was moderately dilated.  ? 6. Mild mitral annular calcification.  ? 7. Mild to moderate aortic valve annular calcification.  ? 8. The mitral valve is degenerative. Mild mitral valve regurgitation.  ? 9. The tricuspid  valve is grossly normal. Tricuspid valve regurgitation  ?is mild.  ?10. The aortic valve is tricuspid. Aortic valve regurgitation is mild.  ?Mild aortic valve sclerosis without stenosis.  ?11. The pulmonic valve was grossly normal. Pulmonic valve regurgitation is  ?mild.  ?12. Normal pulmonary artery systolic pressure.  ?13. The tricuspid regurgitant velocity is 2.23 m/s, and with an assumed  ?right atrial pressure of 8 mmHg, the estimated right ventricular systolic  ?pressure is normal at 27.9 mmHg.  ?14. The inferior vena cava is normal in size with <50% respiratory  ?variability, suggesting right atrial pressure of 8 mmHg.  ? ?Assessment and Plan: ? ?  1.  Permanent atrial fibrillation with CHA2DS2-VASc score of 3-4.  Continue Eliquis at 2.5 mg twice daily (age and renal function).  He is no longer on AV nodal blockers as discussed above, resting heart rates under 100. ? ?2.  Symptomatic hypotension and orthostasis.  Now off atenolol and Flomax since last visit.  Blood pressure trend is relatively stable most recently.  Continue midodrine at 5 mg twice daily. ? ?3.  Sinus node dysfunction with St. Jude pacemaker in place and follow-up by Dr. Caryl Comes. ? ?4.  CKD stage IIIb, last creatinine 1.93. ? ?Medication Adjustments/Labs and Tests Ordered: ?Current medicines are reviewed at length with the patient today.  Concerns regarding medicines are outlined above.  ? ?Tests Ordered: ?No orders of the defined types were placed in this encounter. ? ? ?Medication Changes: ?No orders of the defined types were placed in this encounter. ? ? ?Disposition:  Follow up  3 months. ? ?Signed, ?Satira Sark, MD, [facility] ?12/14/2021 2:22 PM   ?[place] at [facility] ?[place], [place], [place] [address] ?Phone: [phone]; Fax: [phone]  ?

## 2021-12-20 DIAGNOSIS — H401112 Primary open-angle glaucoma, right eye, moderate stage: Secondary | ICD-10-CM | POA: Diagnosis not present

## 2021-12-21 ENCOUNTER — Emergency Department (HOSPITAL_COMMUNITY): Payer: Medicare Other

## 2021-12-21 ENCOUNTER — Other Ambulatory Visit: Payer: Self-pay

## 2021-12-21 ENCOUNTER — Emergency Department (HOSPITAL_COMMUNITY)
Admission: EM | Admit: 2021-12-21 | Discharge: 2021-12-21 | Disposition: A | Discharge status: Home / Self Care | Payer: Medicare Other | Attending: Emergency Medicine | Admitting: Emergency Medicine

## 2021-12-21 ENCOUNTER — Encounter (HOSPITAL_COMMUNITY): Payer: Self-pay | Admitting: Emergency Medicine

## 2021-12-21 DIAGNOSIS — R0789 Other chest pain: Secondary | ICD-10-CM | POA: Diagnosis not present

## 2021-12-21 DIAGNOSIS — Z7951 Long term (current) use of inhaled steroids: Secondary | ICD-10-CM | POA: Diagnosis not present

## 2021-12-21 DIAGNOSIS — F0390 Unspecified dementia without behavioral disturbance: Secondary | ICD-10-CM | POA: Insufficient documentation

## 2021-12-21 DIAGNOSIS — Z7901 Long term (current) use of anticoagulants: Secondary | ICD-10-CM | POA: Insufficient documentation

## 2021-12-21 DIAGNOSIS — S51011A Laceration without foreign body of right elbow, initial encounter: Secondary | ICD-10-CM | POA: Insufficient documentation

## 2021-12-21 DIAGNOSIS — Z043 Encounter for examination and observation following other accident: Secondary | ICD-10-CM | POA: Diagnosis not present

## 2021-12-21 DIAGNOSIS — Y92511 Restaurant or cafe as the place of occurrence of the external cause: Secondary | ICD-10-CM | POA: Diagnosis not present

## 2021-12-21 DIAGNOSIS — W010XXA Fall on same level from slipping, tripping and stumbling without subsequent striking against object, initial encounter: Secondary | ICD-10-CM | POA: Diagnosis not present

## 2021-12-21 DIAGNOSIS — M1612 Unilateral primary osteoarthritis, left hip: Secondary | ICD-10-CM | POA: Diagnosis not present

## 2021-12-21 DIAGNOSIS — W19XXXA Unspecified fall, initial encounter: Secondary | ICD-10-CM

## 2021-12-21 DIAGNOSIS — R9082 White matter disease, unspecified: Secondary | ICD-10-CM | POA: Diagnosis not present

## 2021-12-21 DIAGNOSIS — R1084 Generalized abdominal pain: Secondary | ICD-10-CM | POA: Insufficient documentation

## 2021-12-21 DIAGNOSIS — I672 Cerebral atherosclerosis: Secondary | ICD-10-CM | POA: Diagnosis not present

## 2021-12-21 DIAGNOSIS — S59901A Unspecified injury of right elbow, initial encounter: Secondary | ICD-10-CM | POA: Diagnosis present

## 2021-12-21 DIAGNOSIS — S299XXA Unspecified injury of thorax, initial encounter: Secondary | ICD-10-CM | POA: Diagnosis not present

## 2021-12-21 DIAGNOSIS — I6529 Occlusion and stenosis of unspecified carotid artery: Secondary | ICD-10-CM | POA: Diagnosis not present

## 2021-12-21 DIAGNOSIS — I10 Essential (primary) hypertension: Secondary | ICD-10-CM | POA: Insufficient documentation

## 2021-12-21 DIAGNOSIS — S3991XA Unspecified injury of abdomen, initial encounter: Secondary | ICD-10-CM | POA: Diagnosis not present

## 2021-12-21 LAB — CBC WITH DIFFERENTIAL/PLATELET
Abs Immature Granulocytes: 0.02 10*3/uL (ref 0.00–0.07)
Basophils Absolute: 0 10*3/uL (ref 0.0–0.1)
Basophils Relative: 1 %
Eosinophils Absolute: 0.1 10*3/uL (ref 0.0–0.5)
Eosinophils Relative: 2 %
HCT: 31.6 % — ABNORMAL LOW (ref 39.0–52.0)
Hemoglobin: 10.1 g/dL — ABNORMAL LOW (ref 13.0–17.0)
Immature Granulocytes: 0 %
Lymphocytes Relative: 22 %
Lymphs Abs: 1 10*3/uL (ref 0.7–4.0)
MCH: 35.3 pg — ABNORMAL HIGH (ref 26.0–34.0)
MCHC: 32 g/dL (ref 30.0–36.0)
MCV: 110.5 fL — ABNORMAL HIGH (ref 80.0–100.0)
Monocytes Absolute: 0.4 10*3/uL (ref 0.1–1.0)
Monocytes Relative: 10 %
Neutro Abs: 3 10*3/uL (ref 1.7–7.7)
Neutrophils Relative %: 65 %
Platelets: 165 10*3/uL (ref 150–400)
RBC: 2.86 MIL/uL — ABNORMAL LOW (ref 4.22–5.81)
RDW: 12.5 % (ref 11.5–15.5)
WBC: 4.5 10*3/uL (ref 4.0–10.5)
nRBC: 0 % (ref 0.0–0.2)

## 2021-12-21 LAB — BASIC METABOLIC PANEL
Anion gap: 6 (ref 5–15)
BUN: 42 mg/dL — ABNORMAL HIGH (ref 8–23)
CO2: 25 mmol/L (ref 22–32)
Calcium: 9.6 mg/dL (ref 8.9–10.3)
Chloride: 109 mmol/L (ref 98–111)
Creatinine, Ser: 2.29 mg/dL — ABNORMAL HIGH (ref 0.61–1.24)
GFR, Estimated: 27 mL/min — ABNORMAL LOW (ref >60)
Glucose, Bld: 102 mg/dL — ABNORMAL HIGH (ref 70–99)
Potassium: 4.7 mmol/L (ref 3.5–5.1)
Sodium: 140 mmol/L (ref 135–145)

## 2021-12-21 NOTE — Discharge Instructions (Signed)
Your work-up today was reassuring, no signs of fractures dislocations.  Take Tylenol for pain.  Your kidney function was slightly elevated today, please follow-up with your PCP.  See your primary later this week for reevaluation to make sure you are doing well, return to ED if you have new symptoms or concerning symptoms. ?

## 2021-12-21 NOTE — ED Triage Notes (Signed)
Pt was walking out of restaurant and fell, landing on right side, bruising and laceration to right arm, pt c/o of ribcage pain, currently on Eliquis. ?

## 2021-12-21 NOTE — ED Provider Notes (Addendum)
?[name] ?Provider Note ? ? ?CSN: [id number] ?Arrival date & time: 12/21/21  1545 ? ?  ? ?History ? ?Chief Complaint  ?Patient presents with  ? Fall  ? ? ?Benjamin Mason is a 88 y.o. male. ? ? ?Fall ? ? ?Patient with medical history notable for atrial fibrillation on chronic Eliquis, hypertension, hyperlipidemia, dementia presents today due to fall.  He was walking in the restaurant when he tripped and landed forward on his right side.  Wife did not see him hit his head.  He endorses pain to the right side of his ribs and right abdomen.  Pain is constant, he denies hitting his head or having any vomiting, vision changes or neck pain. ? ?Home Medications ?Prior to Admission medications   ?Medication Sig Start Date End Date Taking? Authorizing Provider  ?acetaminophen (TYLENOL) 650 MG CR tablet Take 1,300 mg by mouth 2 (two) times daily.    [provider]  ?Apoaequorin 10 MG CAPS Take 10 mg by mouth daily.     [provider]  ?cholecalciferol (VITAMIN D3) 25 MCG (1000 UT) tablet Take 1,000 Units by mouth daily.    [provider]  ?donepezil (ARICEPT) 10 MG tablet Take 10 mg by mouth at bedtime. 09/07/21   [provider]  ?Arne Cleveland 2.5 MG TABS tablet TAKE ONE TABLET BY MOUTH TWICE DAILY 08/10/21   Satira Sark, MD  ?fexofenadine (ALLEGRA) 180 MG tablet Take 180 mg by mouth daily.    [provider]  ?finasteride (PROSCAR) 5 MG tablet Take 5 mg by mouth daily.      [provider]  ?fluticasone (FLONASE) 50 MCG/ACT nasal spray Place into both nostrils daily.    [provider]  ?melatonin 3 MG TABS tablet Take 3 mg by mouth at bedtime.    [provider]  ?midodrine (PROAMATINE) 5 MG tablet Take 1 tablet (5 mg total) by mouth 2 (two) times daily with a meal. 10/22/21   Satira Sark, MD  ?Multiple Vitamin (MULTIVITAMIN WITH MINERALS) TABS tablet Take 1 tablet by mouth daily.    [provider]   ?nitroGLYCERIN (NITROSTAT) 0.4 MG SL tablet Place 1 tablet (0.4 mg total) under the tongue every 5 (five) minutes x 3 doses as needed for chest pain (if no relief after 3rd dose, proceed to the ED for an evaluation or call 911). PLACE ONE TABLET UNDER THE TONGUE EVERY 5 MINUTES AS NEEDED FOR CHEST PAIN 03/18/20   Satira Sark, MD  ?pantoprazole (PROTONIX) 40 MG tablet Take 40 mg by mouth daily.  01/17/19   [provider]  ?sertraline (ZOLOFT) 25 MG tablet Take 25 mg by mouth every morning. 09/07/21   [provider]  ?simvastatin (ZOCOR) 20 MG tablet Take 20 mg by mouth at bedtime. 01/17/19   [provider]  ?   ? ?Allergies    ?Patient has no known allergies.   ? ?Review of Systems   ?Review of Systems ? ?Physical Exam ?Updated Vital Signs ?BP (!) 141/71   Pulse 69   Temp 98.4 ?F (36.9 ?C)   Resp 17   Ht 6' (1.829 m)   Wt 71.2 kg   SpO2 100%   BMI 21.28 kg/m?  ?Physical Exam ?Vitals and nursing note reviewed. Exam conducted with a chaperone present.  ?Constitutional:   ?   Appearance: Normal appearance.  ?HENT:  ?   Head: Normocephalic.  ?   Right Ear: Tympanic membrane normal.  ?  Left Ear: Tympanic membrane normal.  ?Eyes:  ?   General: No scleral icterus.    ?   Right eye: No discharge.     ?   Left eye: No discharge.  ?   Extraocular Movements: Extraocular movements intact.  ?   Pupils: Pupils are equal, round, and reactive to light.  ?   Comments: No racoon eye  ?Cardiovascular:  ?   Rate and Rhythm: Normal rate and regular rhythm.  ?   Pulses: Normal pulses.  ?   Heart sounds: Normal heart sounds. No murmur heard. ?  No friction rub. No gallop.  ?   Comments: Right-sided chest wall tenderness ?Pulmonary:  ?   Effort: Pulmonary effort is normal. No respiratory distress.  ?   Breath sounds: Normal breath sounds.  ?Abdominal:  ?   General: Abdomen is flat. Bowel sounds are normal. There is no distension.  ?   Palpations: Abdomen is soft.  ?   Tenderness: There is abdominal  tenderness.  ?   Comments: Diffuse right sided abdominal pain  ?Skin: ?   General: Skin is warm and dry.  ?   Coloration: Skin is not jaundiced.  ?   Comments: Skin tear to right elbow, superficial. Moving upper and lower extremities without difficulty.   ?Neurological:  ?   Mental Status: He is alert. Mental status is at baseline.  ?   Coordination: Coordination normal.  ? ? ?ED Results / Procedures / Treatments   ?Labs ?(all labs ordered are listed, but only abnormal results are displayed) ?Labs Reviewed  ?BASIC METABOLIC PANEL - Abnormal; Notable for the following components:  ?    Result Value  ? Glucose, Bld 102 (*)   ? BUN 42 (*)   ? Creatinine, Ser 2.29 (*)   ? GFR, Estimated 27 (*)   ? All other components within normal limits  ?CBC WITH DIFFERENTIAL/PLATELET - Abnormal; Notable for the following components:  ? RBC 2.86 (*)   ? Hemoglobin 10.1 (*)   ? HCT 31.6 (*)   ? MCV 110.5 (*)   ? MCH 35.3 (*)   ? All other components within normal limits  ? ? ?EKG ?EKG Interpretation ? ?Date/Time:  Tuesday Dec 21 2021 16:12:56 EDT ?Ventricular Rate:  83 ?PR Interval:    ?QRS Duration: 82 ?QT Interval:  378 ?QTC Calculation: 444 ?R Axis:   142 ?Text Interpretation: Atrial fibrillation with premature ventricular or aberrantly conducted complexes Right axis deviation Low voltage QRS Abnormal ECG When compared with ECG of 06-Oct-2021 12:30, PREVIOUS ECG IS PRESENT Confirmed by Nanda Quinton [phone]) on 12/21/2021 4:21:36 PM ? ?Radiology ?DG Ribs Unilateral W/Chest Right ? ?Result Date: 12/21/2021 ?CLINICAL DATA:  [place] with trauma to the right side of the chest. Right-sided pain. EXAM: RIGHT RIBS AND CHEST - 3+ VIEW COMPARISON:  07/29/2021 FINDINGS: Heart size is normal. Single lead pacemaker with lead in the region of the right ventricle. Heart size is normal. Mild tortuosity of the aorta. The lungs are clear. No pneumothorax or hemothorax. Right rib films do not show a visible fracture, with specific attention to the lower  anterior right ribs. IMPRESSION: No active cardiopulmonary disease.  No visible right rib fracture. Electronically Signed   By: Nelson Chimes M.D.   On: 12/21/2021 17:16  ? ?DG Pelvis 1-2 Views ? ?Result Date: 12/21/2021 ?CLINICAL DATA:  [place], landing on the right side. EXAM: PELVIS - 1-2 VIEW COMPARISON:  None FINDINGS: Previous gamma nail fixation of the right  femur. No evidence of acute fracture. Chronic osteoarthritis of the left hip. IMPRESSION: No acute traumatic finding. Old gamma nail on the right. Osteoarthritis of the left hip. Electronically Signed   By: Nelson Chimes M.D.   On: 12/21/2021 17:18  ? ?CT Head Wo Contrast ? ?Result Date: 12/21/2021 ?CLINICAL DATA:  Fall on Eliquis EXAM: CT HEAD WITHOUT CONTRAST TECHNIQUE: Contiguous axial images were obtained from the base of the skull through the vertex without intravenous contrast. RADIATION DOSE REDUCTION: This exam was performed according to the departmental dose-optimization program which includes automated exposure control, adjustment of the mA and/or kV according to patient size and/or use of iterative reconstruction technique. COMPARISON:  CT October 06, 2021 FINDINGS: Brain: No evidence of acute large vascular territory infarction, hemorrhage, hydrocephalus, extra-axial collection or mass lesion/mass effect. Age-related global parenchymal volume loss. Similar moderate burden of chronic ischemic small vessel white matter disease. Vascular: No hyperdense vessel. Atherosclerotic calcifications of the internal carotid and vertebral arteries at the skull base. Skull: Normal. Negative for fracture or focal lesion. Sinuses/Orbits: Polypoid mucosal thickening of the left maxillary sinus. Other: Mastoid air cells are predominantly clear. Pneumatization of the left petrous apex. Probable dense cerumen in the right external auditory canal. Streak artifact from dental hardware. IMPRESSION: 1. No acute intracranial abnormality. 2. Similar age-related global  parenchymal volume loss and chronic ischemic small vessel white matter disease. Electronically Signed   By: Dahlia Bailiff M.D.   On: 12/21/2021 18:52  ? ?CT CHEST ABDOMEN PELVIS WO CONTRAST ? ?Result Date: 12/21/2021 ?CLINICAL DATA:

## 2021-12-23 DIAGNOSIS — E7849 Other hyperlipidemia: Secondary | ICD-10-CM | POA: Diagnosis not present

## 2021-12-23 DIAGNOSIS — I10 Essential (primary) hypertension: Secondary | ICD-10-CM | POA: Diagnosis not present

## 2021-12-23 DIAGNOSIS — D519 Vitamin B12 deficiency anemia, unspecified: Secondary | ICD-10-CM | POA: Diagnosis not present

## 2021-12-23 DIAGNOSIS — E875 Hyperkalemia: Secondary | ICD-10-CM | POA: Diagnosis not present

## 2021-12-23 DIAGNOSIS — D529 Folate deficiency anemia, unspecified: Secondary | ICD-10-CM | POA: Diagnosis not present

## 2021-12-23 DIAGNOSIS — D649 Anemia, unspecified: Secondary | ICD-10-CM | POA: Diagnosis not present

## 2021-12-23 DIAGNOSIS — E782 Mixed hyperlipidemia: Secondary | ICD-10-CM | POA: Diagnosis not present

## 2021-12-23 DIAGNOSIS — R739 Hyperglycemia, unspecified: Secondary | ICD-10-CM | POA: Diagnosis not present

## 2021-12-23 DIAGNOSIS — R5383 Other fatigue: Secondary | ICD-10-CM | POA: Diagnosis not present

## 2021-12-30 ENCOUNTER — Ambulatory Visit (INDEPENDENT_AMBULATORY_CARE_PROVIDER_SITE_OTHER): Payer: Medicare Other

## 2021-12-30 DIAGNOSIS — E7849 Other hyperlipidemia: Secondary | ICD-10-CM | POA: Diagnosis not present

## 2021-12-30 DIAGNOSIS — D638 Anemia in other chronic diseases classified elsewhere: Secondary | ICD-10-CM | POA: Diagnosis not present

## 2021-12-30 DIAGNOSIS — I495 Sick sinus syndrome: Secondary | ICD-10-CM | POA: Diagnosis not present

## 2021-12-30 DIAGNOSIS — I2510 Atherosclerotic heart disease of native coronary artery without angina pectoris: Secondary | ICD-10-CM | POA: Diagnosis not present

## 2021-12-30 DIAGNOSIS — I10 Essential (primary) hypertension: Secondary | ICD-10-CM | POA: Diagnosis not present

## 2021-12-30 DIAGNOSIS — G3184 Mild cognitive impairment, so stated: Secondary | ICD-10-CM | POA: Diagnosis not present

## 2021-12-30 DIAGNOSIS — Z6821 Body mass index (BMI) 21.0-21.9, adult: Secondary | ICD-10-CM | POA: Diagnosis not present

## 2021-12-31 LAB — CUP PACEART REMOTE DEVICE CHECK
Battery Remaining Longevity: 118 mo
Battery Remaining Percentage: 88 %
Battery Voltage: 3.02 V
Brady Statistic RV Percent Paced: 1.6 %
Date Time Interrogation Session: 20230518040014
Implantable Lead Implant Date: 20210517
Implantable Lead Location: 753860
Implantable Pulse Generator Implant Date: 20210517
Lead Channel Impedance Value: 550 Ohm
Lead Channel Pacing Threshold Amplitude: 0.5 V
Lead Channel Pacing Threshold Pulse Width: 0.5 ms
Lead Channel Sensing Intrinsic Amplitude: 4.9 mV
Lead Channel Setting Pacing Amplitude: 2.5 V
Lead Channel Setting Pacing Pulse Width: 0.5 ms
Lead Channel Setting Sensing Sensitivity: 0.7 mV
Pulse Gen Model: 1272
Pulse Gen Serial Number: 3800831

## 2022-01-07 NOTE — Progress Notes (Signed)
Remote pacemaker transmission.   

## 2022-01-26 ENCOUNTER — Other Ambulatory Visit: Payer: Self-pay

## 2022-01-26 ENCOUNTER — Telehealth: Payer: Self-pay | Admitting: Cardiology

## 2022-01-26 MED ORDER — MIDODRINE HCL 5 MG PO TABS: ORAL_TABLET | ORAL | 1 refills | Status: DC

## 2022-01-26 NOTE — Telephone Encounter (Signed)
STAT if patient feels like he/she is going to faint   Are you dizzy now?  Unsure, patient's daughter is not currently with the patient  Do you feel faint or have you passed out?  Not currently with the patient  Do you have any other symptoms?  Low BP, sleeping a lot  Have you checked your HR and BP (record if available)?   6/14: 104/63 85 (this morning)           84/53 82 (this afternoon- sitting)           95/62 87 6/13: 103/58 91 83/54 (sometime last week)  Patient's daughter assumes the patient may need to increase Midodrine.

## 2022-01-26 NOTE — Telephone Encounter (Signed)
Daughter verbalized understanding. Rx sent to the pharmacy with directions to take two 5 mg tablets in the morning and one in the evening.

## 2022-01-26 NOTE — Telephone Encounter (Signed)
     Covering for Dr. Domenic Polite - That would be reasonable to do given his readings. Can provide an updated Rx for 10 mg in AM/5 mg in PM. Pending his readings and how he responds to this, Midodrine can also be taken as a TID medication so sometimes we have to do '5mg'$  TID up to '10mg'$  TID. Would gradually adjust and they can report back on readings with the initial dose adjustment.   Signed, Erma Heritage, PA-C 01/26/2022, 4:26 PM Pager: [phone]

## 2022-01-26 NOTE — Telephone Encounter (Signed)
Daughter states that the patients BP has been running low especially in the mornings. States that this is an issue that Dr. Domenic Polite is aware of. States that she thinks the midodrine could stand to be increased from 5 mg twice a day to maybe 10 mg in the morning and 5 mg in the evening? Please advise.

## 2022-01-31 DIAGNOSIS — H401113 Primary open-angle glaucoma, right eye, severe stage: Secondary | ICD-10-CM | POA: Diagnosis not present

## 2022-02-03 DIAGNOSIS — M79675 Pain in left toe(s): Secondary | ICD-10-CM | POA: Diagnosis not present

## 2022-02-03 DIAGNOSIS — M79671 Pain in right foot: Secondary | ICD-10-CM | POA: Diagnosis not present

## 2022-02-03 DIAGNOSIS — M79674 Pain in right toe(s): Secondary | ICD-10-CM | POA: Diagnosis not present

## 2022-02-03 DIAGNOSIS — M79672 Pain in left foot: Secondary | ICD-10-CM | POA: Diagnosis not present

## 2022-02-03 DIAGNOSIS — I739 Peripheral vascular disease, unspecified: Secondary | ICD-10-CM | POA: Diagnosis not present

## 2022-02-07 ENCOUNTER — Emergency Department (HOSPITAL_COMMUNITY): Payer: Medicare Other

## 2022-02-07 ENCOUNTER — Other Ambulatory Visit: Payer: Self-pay

## 2022-02-07 ENCOUNTER — Emergency Department (HOSPITAL_COMMUNITY)
Admission: EM | Admit: 2022-02-07 | Discharge: 2022-02-07 | Disposition: A | Discharge status: Home / Self Care | Payer: Medicare Other | Attending: Emergency Medicine | Admitting: Emergency Medicine

## 2022-02-07 DIAGNOSIS — I959 Hypotension, unspecified: Secondary | ICD-10-CM | POA: Diagnosis not present

## 2022-02-07 DIAGNOSIS — R231 Pallor: Secondary | ICD-10-CM | POA: Diagnosis not present

## 2022-02-07 DIAGNOSIS — Z743 Need for continuous supervision: Secondary | ICD-10-CM | POA: Diagnosis not present

## 2022-02-07 DIAGNOSIS — Z20822 Contact with and (suspected) exposure to covid-19: Secondary | ICD-10-CM | POA: Insufficient documentation

## 2022-02-07 DIAGNOSIS — Z7901 Long term (current) use of anticoagulants: Secondary | ICD-10-CM | POA: Insufficient documentation

## 2022-02-07 DIAGNOSIS — E861 Hypovolemia: Secondary | ICD-10-CM | POA: Insufficient documentation

## 2022-02-07 DIAGNOSIS — F0390 Unspecified dementia without behavioral disturbance: Secondary | ICD-10-CM | POA: Diagnosis not present

## 2022-02-07 DIAGNOSIS — I4891 Unspecified atrial fibrillation: Secondary | ICD-10-CM | POA: Diagnosis not present

## 2022-02-07 DIAGNOSIS — R531 Weakness: Secondary | ICD-10-CM | POA: Diagnosis not present

## 2022-02-07 DIAGNOSIS — R6889 Other general symptoms and signs: Secondary | ICD-10-CM | POA: Diagnosis not present

## 2022-02-07 DIAGNOSIS — I499 Cardiac arrhythmia, unspecified: Secondary | ICD-10-CM | POA: Diagnosis not present

## 2022-02-07 LAB — CBC WITH DIFFERENTIAL/PLATELET
Abs Immature Granulocytes: 0.01 10*3/uL (ref 0.00–0.07)
Basophils Absolute: 0 10*3/uL (ref 0.0–0.1)
Basophils Relative: 1 %
Eosinophils Absolute: 0.1 10*3/uL (ref 0.0–0.5)
Eosinophils Relative: 3 %
HCT: 27.9 % — ABNORMAL LOW (ref 39.0–52.0)
Hemoglobin: 8.8 g/dL — ABNORMAL LOW (ref 13.0–17.0)
Immature Granulocytes: 0 %
Lymphocytes Relative: 22 %
Lymphs Abs: 1 10*3/uL (ref 0.7–4.0)
MCH: 35.1 pg — ABNORMAL HIGH (ref 26.0–34.0)
MCHC: 31.5 g/dL (ref 30.0–36.0)
MCV: 111.2 fL — ABNORMAL HIGH (ref 80.0–100.0)
Monocytes Absolute: 0.5 10*3/uL (ref 0.1–1.0)
Monocytes Relative: 11 %
Neutro Abs: 3 10*3/uL (ref 1.7–7.7)
Neutrophils Relative %: 63 %
Platelets: 148 10*3/uL — ABNORMAL LOW (ref 150–400)
RBC: 2.51 MIL/uL — ABNORMAL LOW (ref 4.22–5.81)
RDW: 12.6 % (ref 11.5–15.5)
WBC: 4.7 10*3/uL (ref 4.0–10.5)
nRBC: 0 % (ref 0.0–0.2)

## 2022-02-07 LAB — COMPREHENSIVE METABOLIC PANEL
ALT: 12 U/L (ref 0–44)
AST: 21 U/L (ref 15–41)
Albumin: 3.5 g/dL (ref 3.5–5.0)
Alkaline Phosphatase: 50 U/L (ref 38–126)
Anion gap: 6 (ref 5–15)
BUN: 52 mg/dL — ABNORMAL HIGH (ref 8–23)
CO2: 25 mmol/L (ref 22–32)
Calcium: 8.8 mg/dL — ABNORMAL LOW (ref 8.9–10.3)
Chloride: 107 mmol/L (ref 98–111)
Creatinine, Ser: 2.2 mg/dL — ABNORMAL HIGH (ref 0.61–1.24)
GFR, Estimated: 28 mL/min — ABNORMAL LOW (ref >60)
Glucose, Bld: 107 mg/dL — ABNORMAL HIGH (ref 70–99)
Potassium: 4.3 mmol/L (ref 3.5–5.1)
Sodium: 138 mmol/L (ref 135–145)
Total Bilirubin: 0.7 mg/dL (ref 0.3–1.2)
Total Protein: 6.1 g/dL — ABNORMAL LOW (ref 6.5–8.1)

## 2022-02-07 LAB — TROPONIN I (HIGH SENSITIVITY)
Troponin I (High Sensitivity): 19 ng/L — ABNORMAL HIGH (ref <18)
Troponin I (High Sensitivity): 16 ng/L (ref <18)

## 2022-02-07 LAB — URINALYSIS, ROUTINE W REFLEX MICROSCOPIC
Bilirubin Urine: NEGATIVE
Glucose, UA: NEGATIVE mg/dL
Hgb urine dipstick: NEGATIVE
Ketones, ur: NEGATIVE mg/dL
Leukocytes,Ua: NEGATIVE
Nitrite: NEGATIVE
Protein, ur: NEGATIVE mg/dL
Specific Gravity, Urine: 1.018 (ref 1.005–1.030)
pH: 5 (ref 5.0–8.0)

## 2022-02-07 LAB — POC OCCULT BLOOD, ED: Fecal Occult Bld: NEGATIVE

## 2022-02-07 LAB — AMMONIA: Ammonia: 14 umol/L (ref 9–35)

## 2022-02-07 LAB — RESP PANEL BY RT-PCR (FLU A&B, COVID) ARPGX2
Influenza A by PCR: NEGATIVE
Influenza B by PCR: NEGATIVE
SARS Coronavirus 2 by RT PCR: NEGATIVE

## 2022-02-07 LAB — LACTIC ACID, PLASMA: Lactic Acid, Venous: 0.8 mmol/L (ref 0.5–1.9)

## 2022-02-07 LAB — PROTIME-INR
INR: 1.2 (ref 0.8–1.2)
Prothrombin Time: 15 seconds (ref 11.4–15.2)

## 2022-02-07 LAB — APTT: aPTT: 30 seconds (ref 24–36)

## 2022-02-07 MED ORDER — SODIUM CHLORIDE 0.9 % IV BOLUS (SEPSIS)
1000.0000 mL | Freq: Once | INTRAVENOUS | Status: AC
  Administered 2022-02-07: 1000 mL via INTRAVENOUS

## 2022-02-07 MED ORDER — SODIUM CHLORIDE 0.9 % IV SOLN: INTRAVENOUS | Status: DC

## 2022-02-07 MED ORDER — SODIUM CHLORIDE 0.9 % IV SOLN
2.0000 g | INTRAVENOUS | Status: DC
  Administered 2022-02-07: 2 g via INTRAVENOUS
  Filled 2022-02-07: qty 20

## 2022-02-07 NOTE — ED Notes (Signed)
Introduced self to patient and family. Repositioned patient.

## 2022-02-09 LAB — URINE CULTURE: Culture: NO GROWTH

## 2022-02-12 LAB — CULTURE, BLOOD (ROUTINE X 2)
Culture: NO GROWTH
Culture: NO GROWTH
Special Requests: ADEQUATE

## 2022-02-14 ENCOUNTER — Other Ambulatory Visit: Payer: Self-pay | Admitting: Cardiology

## 2022-02-14 NOTE — Telephone Encounter (Signed)
Prescription refill request for Eliquis received. Indication: Atrial Fib Last office visit: 12/14/21  Myles Gip MD Scr: 2.20 on 02/07/22 Age:  88 Weight: 72.8kg  Based on above findings Eliquis 2.'5mg'$  twice daily is the appropriate dose.  Refill approved.

## 2022-03-01 DIAGNOSIS — R410 Disorientation, unspecified: Secondary | ICD-10-CM | POA: Diagnosis not present

## 2022-03-01 DIAGNOSIS — S299XXA Unspecified injury of thorax, initial encounter: Secondary | ICD-10-CM | POA: Diagnosis not present

## 2022-03-01 DIAGNOSIS — J449 Chronic obstructive pulmonary disease, unspecified: Secondary | ICD-10-CM | POA: Diagnosis not present

## 2022-03-01 DIAGNOSIS — Z87891 Personal history of nicotine dependence: Secondary | ICD-10-CM | POA: Diagnosis not present

## 2022-03-01 DIAGNOSIS — S3993XA Unspecified injury of pelvis, initial encounter: Secondary | ICD-10-CM | POA: Diagnosis not present

## 2022-03-01 DIAGNOSIS — Z23 Encounter for immunization: Secondary | ICD-10-CM | POA: Diagnosis not present

## 2022-03-01 DIAGNOSIS — W1839XA Other fall on same level, initial encounter: Secondary | ICD-10-CM | POA: Diagnosis not present

## 2022-03-01 DIAGNOSIS — I509 Heart failure, unspecified: Secondary | ICD-10-CM | POA: Diagnosis not present

## 2022-03-01 DIAGNOSIS — M549 Dorsalgia, unspecified: Secondary | ICD-10-CM | POA: Diagnosis not present

## 2022-03-01 DIAGNOSIS — S0990XA Unspecified injury of head, initial encounter: Secondary | ICD-10-CM | POA: Diagnosis not present

## 2022-03-01 DIAGNOSIS — Z950 Presence of cardiac pacemaker: Secondary | ICD-10-CM | POA: Diagnosis not present

## 2022-03-01 DIAGNOSIS — S20412A Abrasion of left back wall of thorax, initial encounter: Secondary | ICD-10-CM | POA: Diagnosis not present

## 2022-03-01 DIAGNOSIS — R509 Fever, unspecified: Secondary | ICD-10-CM | POA: Diagnosis not present

## 2022-03-01 DIAGNOSIS — Z043 Encounter for examination and observation following other accident: Secondary | ICD-10-CM | POA: Diagnosis not present

## 2022-03-01 DIAGNOSIS — R0689 Other abnormalities of breathing: Secondary | ICD-10-CM | POA: Diagnosis not present

## 2022-03-01 DIAGNOSIS — I130 Hypertensive heart and chronic kidney disease with heart failure and stage 1 through stage 4 chronic kidney disease, or unspecified chronic kidney disease: Secondary | ICD-10-CM | POA: Diagnosis not present

## 2022-03-01 DIAGNOSIS — Z743 Need for continuous supervision: Secondary | ICD-10-CM | POA: Diagnosis not present

## 2022-03-01 DIAGNOSIS — M1612 Unilateral primary osteoarthritis, left hip: Secondary | ICD-10-CM | POA: Diagnosis not present

## 2022-03-01 DIAGNOSIS — N189 Chronic kidney disease, unspecified: Secondary | ICD-10-CM | POA: Diagnosis not present

## 2022-03-01 DIAGNOSIS — Z9889 Other specified postprocedural states: Secondary | ICD-10-CM | POA: Diagnosis not present

## 2022-03-01 DIAGNOSIS — R531 Weakness: Secondary | ICD-10-CM | POA: Diagnosis not present

## 2022-03-21 DIAGNOSIS — I10 Essential (primary) hypertension: Secondary | ICD-10-CM | POA: Diagnosis not present

## 2022-03-21 DIAGNOSIS — N183 Chronic kidney disease, stage 3 unspecified: Secondary | ICD-10-CM | POA: Diagnosis not present

## 2022-03-21 DIAGNOSIS — K2100 Gastro-esophageal reflux disease with esophagitis, without bleeding: Secondary | ICD-10-CM | POA: Diagnosis not present

## 2022-03-21 DIAGNOSIS — E7849 Other hyperlipidemia: Secondary | ICD-10-CM | POA: Diagnosis not present

## 2022-03-21 DIAGNOSIS — G309 Alzheimer's disease, unspecified: Secondary | ICD-10-CM | POA: Diagnosis not present

## 2022-03-21 DIAGNOSIS — E875 Hyperkalemia: Secondary | ICD-10-CM | POA: Diagnosis not present

## 2022-03-25 DIAGNOSIS — G3184 Mild cognitive impairment, so stated: Secondary | ICD-10-CM | POA: Diagnosis not present

## 2022-03-25 DIAGNOSIS — Z6821 Body mass index (BMI) 21.0-21.9, adult: Secondary | ICD-10-CM | POA: Diagnosis not present

## 2022-03-25 DIAGNOSIS — I2510 Atherosclerotic heart disease of native coronary artery without angina pectoris: Secondary | ICD-10-CM | POA: Diagnosis not present

## 2022-03-25 DIAGNOSIS — F5101 Primary insomnia: Secondary | ICD-10-CM | POA: Diagnosis not present

## 2022-03-25 DIAGNOSIS — I482 Chronic atrial fibrillation, unspecified: Secondary | ICD-10-CM | POA: Diagnosis not present

## 2022-03-25 DIAGNOSIS — D638 Anemia in other chronic diseases classified elsewhere: Secondary | ICD-10-CM | POA: Diagnosis not present

## 2022-03-25 DIAGNOSIS — E7849 Other hyperlipidemia: Secondary | ICD-10-CM | POA: Diagnosis not present

## 2022-03-25 DIAGNOSIS — N183 Chronic kidney disease, stage 3 unspecified: Secondary | ICD-10-CM | POA: Diagnosis not present

## 2022-03-25 DIAGNOSIS — N189 Chronic kidney disease, unspecified: Secondary | ICD-10-CM | POA: Diagnosis not present

## 2022-03-25 DIAGNOSIS — K2100 Gastro-esophageal reflux disease with esophagitis, without bleeding: Secondary | ICD-10-CM | POA: Diagnosis not present

## 2022-03-25 DIAGNOSIS — I10 Essential (primary) hypertension: Secondary | ICD-10-CM | POA: Diagnosis not present

## 2022-03-25 DIAGNOSIS — E782 Mixed hyperlipidemia: Secondary | ICD-10-CM | POA: Diagnosis not present

## 2022-03-29 DIAGNOSIS — F03B0 Unspecified dementia, moderate, without behavioral disturbance, psychotic disturbance, mood disturbance, and anxiety: Secondary | ICD-10-CM | POA: Diagnosis not present

## 2022-03-29 DIAGNOSIS — F32A Depression, unspecified: Secondary | ICD-10-CM | POA: Diagnosis not present

## 2022-03-31 ENCOUNTER — Ambulatory Visit (INDEPENDENT_AMBULATORY_CARE_PROVIDER_SITE_OTHER): Payer: Medicare Other

## 2022-03-31 DIAGNOSIS — I495 Sick sinus syndrome: Secondary | ICD-10-CM | POA: Diagnosis not present

## 2022-04-01 LAB — CUP PACEART REMOTE DEVICE CHECK
Battery Remaining Longevity: 114 mo
Battery Remaining Percentage: 86 %
Battery Voltage: 3.02 V
Brady Statistic RV Percent Paced: 1.2 %
Date Time Interrogation Session: 20230817040013
Implantable Lead Implant Date: 20210517
Implantable Lead Location: 753860
Implantable Pulse Generator Implant Date: 20210517
Lead Channel Impedance Value: 530 Ohm
Lead Channel Pacing Threshold Amplitude: 0.5 V
Lead Channel Pacing Threshold Pulse Width: 0.5 ms
Lead Channel Sensing Intrinsic Amplitude: 4.3 mV
Lead Channel Setting Pacing Amplitude: 2.5 V
Lead Channel Setting Pacing Pulse Width: 0.5 ms
Lead Channel Setting Sensing Sensitivity: 0.7 mV
Pulse Gen Model: 1272
Pulse Gen Serial Number: 3800831

## 2022-04-06 ENCOUNTER — Encounter: Payer: Self-pay | Admitting: Cardiology

## 2022-04-06 ENCOUNTER — Ambulatory Visit (INDEPENDENT_AMBULATORY_CARE_PROVIDER_SITE_OTHER): Payer: Medicare Other | Admitting: Cardiology

## 2022-04-06 ENCOUNTER — Encounter: Payer: Self-pay | Admitting: Physician Assistant

## 2022-04-06 VITALS — BP 130/74 | HR 81 | Ht 72.0 in | Wt 157.0 lb

## 2022-04-06 DIAGNOSIS — I4821 Permanent atrial fibrillation: Secondary | ICD-10-CM | POA: Diagnosis not present

## 2022-04-06 DIAGNOSIS — I951 Orthostatic hypotension: Secondary | ICD-10-CM | POA: Diagnosis not present

## 2022-04-06 DIAGNOSIS — I495 Sick sinus syndrome: Secondary | ICD-10-CM

## 2022-04-06 DIAGNOSIS — N1832 Chronic kidney disease, stage 3b: Secondary | ICD-10-CM

## 2022-04-06 NOTE — Progress Notes (Signed)
Cardiology Office Note  Date: 04/06/2022   ID: Cay[place], Kielbasa 1931/07/22, MRN [id number]  PCP:  Manon Hilding, MD  Cardiologist:  Rozann Lesches, MD Electrophysiologist:  None   Chief Complaint  Patient presents with   Cardiac follow-up    History of Present Illness: Benjamin Mason is an 88 y.o. male last seen in May.  He is here today with his daughter for follow-up visit.  Now residing at [place].  He has not had any obvious falls recently.  Tolerating midodrine for treatment of orthostatic hypotension.  St. Jude pacemaker in place with follow-up by Dr. Caryl Comes.  Recent device interrogation showed normal function.  I reviewed his medications, he remains on Eliquis for stroke prophylaxis, lab work from July noted below.  Past Medical History:  Diagnosis Date   Anxiety    Aortic regurgitation    Mild to moderate   CKD (chronic kidney disease) stage 3, GFR 30-59 ml/min (HCC)    Coronary atherosclerosis of native coronary artery    BMS to RCA and LAD 1995   Essential hypertension    Frequent PVCs    IBS (irritable bowel syndrome)    Lactose intolerance    Mitral regurgitation    Moderate   Mixed hyperlipidemia    Permanent atrial fibrillation [facility])    Sick sinus syndrome [facility])    St. Jude single-chamber pacemaker May 2021 - Dr. Caryl Comes   Skin cancer     Past Surgical History:  Procedure Laterality Date   BALLOON DILATION  08/03/2012   Procedure: BALLOON DILATION;  Surgeon: Rogene Houston, MD;  [facility];  Service: Endoscopy;  Laterality: N/A;   COLONOSCOPY WITH ESOPHAGOGASTRODUODENOSCOPY (EGD)  08/03/2012   Procedure: COLONOSCOPY WITH ESOPHAGOGASTRODUODENOSCOPY (EGD);  Surgeon: Rogene Houston, MD;  [facility];  Service: Endoscopy;  Laterality: N/A;  215   LEFT HEART CATH AND CORONARY ANGIOGRAPHY N/A 11/18/2016   Procedure: Left Heart Cath and Coronary Angiography;  Surgeon: Peter M Martinique, MD;  Location: [facility];   Service: Cardiovascular;  Laterality: N/A;   LEFT INGUINAL HERNIA     MALONEY DILATION  08/03/2012   Procedure: MALONEY DILATION;  Surgeon: Rogene Houston, MD;  [facility];  Service: Endoscopy;  Laterality: N/A;   PACEMAKER IMPLANT N/A 12/30/2019   Procedure: PACEMAKER IMPLANT;  Surgeon: Deboraha Sprang, MD;  Location: [facility];  Service: Cardiovascular;  Laterality: N/A;   PENILE PROSTHESIS IMPLANT     SAVORY DILATION  08/03/2012   Procedure: SAVORY DILATION;  Surgeon: Rogene Houston, MD;  [facility];  Service: Endoscopy;  Laterality: N/A;    Current Outpatient Medications  Medication Sig Dispense Refill   acetaminophen (TYLENOL) 650 MG CR tablet Take 1,300 mg by mouth 2 (two) times daily.     brimonidine (ALPHAGAN) 0.2 % ophthalmic solution 1 drop 2 (two) times daily.     donepezil (ARICEPT) 10 MG tablet Take 10 mg by mouth at bedtime.     ELIQUIS 2.5 MG TABS tablet TAKE ONE TABLET BY MOUTH TWICE DAILY 60 tablet 6   finasteride (PROSCAR) 5 MG tablet Take 5 mg by mouth daily.       loratadine (CLARITIN) 10 MG tablet Take 10 mg by mouth daily.     midodrine (PROAMATINE) 5 MG tablet Take 2 tablets in the morning and one tablet in the evening. 270 tablet 1   nitroGLYCERIN (NITROSTAT) 0.4 MG SL tablet  Place 1 tablet (0.4 mg total) under the tongue every 5 (five) minutes x 3 doses as needed for chest pain (if no relief after 3rd dose, proceed to the ED for an evaluation or call 911). PLACE ONE TABLET UNDER THE TONGUE EVERY 5 MINUTES AS NEEDED FOR CHEST PAIN 25 tablet 3   pantoprazole (PROTONIX) 40 MG tablet Take 40 mg by mouth daily.      sertraline (ZOLOFT) 25 MG tablet Take 25 mg by mouth every morning.     No current facility-administered medications for this visit.   Allergies:  Patient has no known allergies.   ROS: Memory loss.  Physical Exam: VS:  BP 130/74 (BP Location: Right Arm, Patient Position: Sitting, Cuff Size: Normal)   Pulse 81   Ht  6' (1.829 m)   Wt 157 lb (71.2 kg)   SpO2 95%   BMI 21.29 kg/m , BMI Body mass index is 21.29 kg/m.  Wt Readings from Last 3 Encounters:  04/06/22 157 lb (71.2 kg)  02/07/22 156 lb 15.5 oz (71.2 kg)  12/21/21 156 lb 14.4 oz (71.2 kg)    General: Patient appears comfortable at rest. HEENT: Conjunctiva and lids normal. Neck: Supple, no elevated JVP or carotid bruits, no thyromegaly. Lungs: Clear to auscultation, nonlabored breathing at rest. Cardiac: Irregular, no S3, 1/6 systolic murmur. Extremities: No pitting edema.  ECG:  An ECG dated 02/07/2022 was personally reviewed today and demonstrated:  Atrial fibrillation.  Recent Labwork: 02/07/2022: ALT 12; AST 21; BUN 52; Creatinine, Ser 2.20; Hemoglobin 8.8; Platelets 148; Potassium 4.3; Sodium 138  July 2023: Hemoglobin 9.4, platelets 150, potassium 4.1, BUN 46, creatinine 1.95, AST 21, ALT 20  Other Studies Reviewed Today:  Echocardiogram 07/09/2019:  1. Left ventricular ejection fraction, by visual estimation, is 55 to  60%. The left ventricle has normal function. There is no left ventricular  hypertrophy.   2. Left ventricular diastolic parameters are indeterminate.   3. Global right ventricle has normal systolic function.The right  ventricular size is mildly enlarged. No increase in right ventricular wall  thickness.   4. Left atrial size was moderately dilated.   5. Right atrial size was moderately dilated.   6. Mild mitral annular calcification.   7. Mild to moderate aortic valve annular calcification.   8. The mitral valve is degenerative. Mild mitral valve regurgitation.   9. The tricuspid valve is grossly normal. Tricuspid valve regurgitation  is mild.  10. The aortic valve is tricuspid. Aortic valve regurgitation is mild.  Mild aortic valve sclerosis without stenosis.  11. The pulmonic valve was grossly normal. Pulmonic valve regurgitation is  mild.  12. Normal pulmonary artery systolic pressure.  13. The  tricuspid regurgitant velocity is 2.23 m/s, and with an assumed  right atrial pressure of 8 mmHg, the estimated right ventricular systolic  pressure is normal at 27.9 mmHg.  14. The inferior vena cava is normal in size with <50% respiratory  variability, suggesting right atrial pressure of 8 mmHg.   Assessment and Plan:  1.  Permanent atrial fibrillation with CHA2DS2-VASc score of 3-4.  He is on Eliquis for stroke prophylaxis, no longer requiring AV nodal blockers.  He does not report any active palpitations.  Recent lab work reviewed.  2.  Orthostatic hypotension, currently symptomatically stable on midodrine.  3.  Sinus node dysfunction with St. Jude pacemaker in place.  Continue to follow with Dr. Caryl Comes.  4.  CKD stage IIIb, creatinine 1.95.  Medication Adjustments/Labs and Tests Ordered: Current  medicines are reviewed at length with the patient today.  Concerns regarding medicines are outlined above.   Tests Ordered: No orders of the defined types were placed in this encounter.   Medication Changes: No orders of the defined types were placed in this encounter.   Disposition:  Follow up  3 to 4 months.  Signed, Satira Sark, MD, [facility] 04/06/2022 4:27 PM    [facility] [place], [place], [place] [address] Phone: [phone]; Fax: [phone]

## 2022-04-06 NOTE — Patient Instructions (Addendum)
Medication Instructions:   Your physician recommends that you continue on your current medications as directed. Please refer to the Current Medication list given to you today.  Labwork:  none  Testing/Procedures:  none  Follow-Up:  Your physician recommends that you schedule a follow-up appointment in: 4 months.   Any Other Special Instructions Will Be Listed Below (If Applicable).  If you need a refill on your cardiac medications before your next appointment, please call your pharmacy. 

## 2022-04-08 DIAGNOSIS — N390 Urinary tract infection, site not specified: Secondary | ICD-10-CM | POA: Diagnosis not present

## 2022-04-08 DIAGNOSIS — I158 Other secondary hypertension: Secondary | ICD-10-CM | POA: Diagnosis not present

## 2022-04-19 ENCOUNTER — Ambulatory Visit: Payer: Medicare Other | Admitting: Physician Assistant

## 2022-04-19 ENCOUNTER — Encounter: Payer: Self-pay | Admitting: Physician Assistant

## 2022-04-19 ENCOUNTER — Other Ambulatory Visit (INDEPENDENT_AMBULATORY_CARE_PROVIDER_SITE_OTHER): Payer: Medicare Other

## 2022-04-19 VITALS — BP 129/74 | HR 91 | Ht 72.0 in | Wt 157.0 lb

## 2022-04-19 DIAGNOSIS — F01518 Vascular dementia, unspecified severity, with other behavioral disturbance: Secondary | ICD-10-CM

## 2022-04-19 DIAGNOSIS — R413 Other amnesia: Secondary | ICD-10-CM

## 2022-04-19 DIAGNOSIS — F02818 Dementia in other diseases classified elsewhere, unspecified severity, with other behavioral disturbance: Secondary | ICD-10-CM | POA: Diagnosis not present

## 2022-04-19 DIAGNOSIS — G309 Alzheimer's disease, unspecified: Secondary | ICD-10-CM

## 2022-04-19 LAB — TSH: TSH: 1.69 u[IU]/mL (ref 0.35–5.50)

## 2022-04-19 LAB — VITAMIN B12: Vitamin B-12: 232 pg/mL (ref 211–911)

## 2022-04-19 NOTE — Progress Notes (Addendum)
Assessment/Plan:    The patient is seen in neurologic consultation at the request of Sasser, Silvestre Moment, MD for the evaluation of memory.  Benjamin Mason is a very pleasant 88 y.o. 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   Patient is on donepezil 10 mg daily per PCP. Prior CT of the head in May 2023 was reviewed, demonstrating moderate intensity with atrophy and chronic microvascular ischemic changes.  He was unable to complete MoCA or MMSE testing. Findings are suggestive of mixed vascular and Alzheimer's disease.  Mixed vascular and Alzheimer's disease, moderate, late onset with behavioral disturbance  Check B12, TSH Continue to monitor mood as per PCP Continue donepezil 10 mg daily.  Side effects discussed. Folllow up in 6 months  Subjective:    The patient is accompanied by his 2 daughters who supplement the history.    How long did patient have memory difficulties? 4-5 years.  Initially, she was forgetting dates, people's names that he knew for a long time, when he began losing objects such as keys and watches.  "The last drop was after his fall in July 2023 "-daughter says.  He also has significant difficulty using tools such as the remote control.  He no longer does crossword puzzles because he gets frustrated.  He was patient lives: Adult living facility [facility]) repeats oneself?  Endorsed.  Speech has become tangential Disoriented when walking into a room?  Sometimes. Leaving objects in unusual places?  Patient denies   Ambulates  with difficulty?   Patient denies   Recent falls and head injury?  Endorsed, he had a presentation to the ED in July 2023 after sustaining a fall while wandering in the woods "looking for birds ".  He received PT.  No LOC, negative work-up at the ED, sent  to [facility] (03/01/2022) History of seizures?   Patient denies   Wandering behavior?  As above, he wandered into with prior to the fall.   Patient drives?   Patient no longer drives since getting lost when he was going to eat at a fast food restaurant.  He denies no longer driving, he reports that "my favorite is the highway "(the keys have been taken away) Any mood changes such irritability agitation?  "He has lost his patience about 1 year ago " Any history of depression?:  "Cried all the time until taking Zoloft for the last year ".  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  Sees birds in the trees.  Paranoia?  Hides his wallet and checkbook under the pillow "so it is safe " Patient reports that sleeps well without vivid dreams, REM behavior or sleepwalking    History of sleep apnea?  Patient denies   Any hygiene concerns? Endorsed, has to be reminded  Independent of bathing and dressing?  Endorsed  Does the patient needs help with medications?  Facility provides the medications Who is in charge of the finances?  Family is in charge. Any changes in appetite?  Patient denies   Patient have trouble swallowing?  Patient has a history of esophageal stricture, and has had evaluation by GI, but there are no difficulties with swallowing. Does the patient cook?  Patient denies   Any kitchen accidents such as  leaving the stove on? Patient denies   Any headaches?  Patient denies   Double vision? Patient denies   Any focal numbness or tingling?  Patient denies   Chronic back pain Patient denies   Unilateral weakness?  Patient denies   Any tremors?  Patient denies   Any history of anosmia?  Patient denies   Any incontinence of urine?  Endorsed uses Depends  Any bowel dysfunction?   Patient denies   History of heavy alcohol intake?  Patient denies   History of heavy tobacco use?  Patient denies   Family history of dementia?  Mother  had Alzheimer's disease    Labs 03/01/2022 H&H was 9.4-28.7 respectively, MCV 107.1 (high)  CT of the head without contrast 03/01/2022 (no film is available for review) showed moderate age-related atrophy and chronic microvascular ischemic changes, no acute intracranial hemorrhage, no fracture. CT cervical spine negative for fracture or abnormalities.  No Known Allergies  Current Outpatient Medications  Medication Instructions   acetaminophen (TYLENOL) 1,300 mg, Oral, 2 times daily   brimonidine (ALPHAGAN) 0.2 % ophthalmic solution 1 drop, 2 times daily   ciprofloxacin (CIPRO) 500 mg, Oral, 2 times daily   donepezil (ARICEPT) 10 mg, Oral, Daily at bedtime   ELIQUIS 2.5 MG TABS tablet TAKE ONE TABLET BY MOUTH TWICE DAILY   finasteride (PROSCAR) 5 mg, Oral, Daily,     loratadine (CLARITIN) 10 mg, Oral, Daily   midodrine (PROAMATINE) 5 MG tablet Take 2 tablets in the morning and one tablet in the evening.   nitroGLYCERIN (NITROSTAT) 0.4 mg, Sublingual, Every 5 min x3 PRN, PLACE ONE TABLET UNDER THE TONGUE EVERY 5 MINUTES AS NEEDED FOR CHEST PAIN   pantoprazole (PROTONIX) 40 mg, Oral, Daily   sertraline (ZOLOFT) 25 mg, Oral, Every morning     VITALS:   Vitals:   04/19/22 1316  BP: 129/74  Pulse: 91  SpO2: 96%  Weight: 157 lb (71.2 kg)  Height: 6' (1.829 m)       No data to display          PHYSICAL EXAM   HEENT:  Normocephalic, atraumatic. The mucous membranes are moist. The superficial temporal arteries are without ropiness or tenderness. Cardiovascular: Regular rate and rhythm. Lungs: Clear to auscultation bilaterally. Neck: There are no carotid bruits noted bilaterally.  NEUROLOGICAL:     No data to display              No data to display           Orientation:  Alert and oriented to person, not to place or time.  No aphasia or dysarthria. Fund of knowledge is reduced. Recent and remote memory impaired.  Attention and concentration are reduced.  Able to name  objects and unable to repeat phrases.  Cranial nerves: There is good facial symmetry. Extraocular muscles are intact and visual fields are full to confrontational testing. Speech is fluent and clear but tangential. Soft palate rises symmetrically and there is no tongue deviation. Hearing is intact to conversational tone. Tone: Tone is good throughout. Sensation: Sensation is intact to light touch and pinprick throughout. Vibration is intact at the bilateral big toe.There is no extinction with double simultaneous stimulation. There is no sensory dermatomal level identified. Coordination: The patient has no difficulty with RAM's or FNF bilaterally. Normal finger to nose  Motor: Strength is 5/5 in the bilateral upper and lower extremities. There is no pronator drift. There are no fasciculations noted. DTR's: Deep tendon reflexes are  2/4 at the bilateral biceps, triceps, brachioradialis, patella and achilles.  Plantar responses are downgoing bilaterally. Gait and Station: The patient is able to ambulate without difficulty.The patient is able to heel toe walk without any difficulty.The patient is able to ambulate in a tandem fashion. The patient is able to stand in the Romberg position.     Thank you for allowing [place] the opportunity to participate in the care of this nice patient. Please do not hesitate to contact us for any questions or concerns.   Total time spent on today's visit was 63 minutes dedicated to this patient today, preparing to see patient, examining the patient, ordering tests and/or medications and counseling the patient, documenting clinical information in the EHR or other health record, independently interpreting results and communicating results to the patient/family, discussing treatment and goals, answering patient's questions and coordinating care.  Cc:  Manon Hilding, MD  Sharene Butters 04/20/2022 6:56 AM

## 2022-04-19 NOTE — Patient Instructions (Addendum)
It was a pleasure to see you today at our office.   Recommendations:  Follow up in 6  months Continue donepezil 10 mg daily. Side effects were discussed  Labs today  Whom to call:  Memory  decline, memory medications: Call our office [phone]   For psychiatric meds, mood meds: Please have your primary care physician manage these medications.   Counseling regarding caregiver distress, including caregiver depression, anxiety and issues regarding community resources, adult day care programs, adult living facilities, or memory care questions:   Feel free to contact [place], Social Worker at [phone]   For assessment of decision of mental capacity and competency:  Call Dr. Anthoney Harada, geriatric psychiatrist at [phone]  For guidance in geriatric dementia issues please call [facility] [phone]  For guidance regarding [facility] and if placement were needed at the facility, contact Arnell Asal, Social Worker tel: [phone]  If you have any severe symptoms of a stroke, or other severe issues such as confusion,severe chills or fever, etc call 911 or go to the ER as you may need to be evaluated further   Feel free to visit Facebook page " [email or web address]" for tips of how to care for people with memory problems.      RECOMMENDATIONS FOR ALL PATIENTS WITH MEMORY PROBLEMS: 1. Continue to exercise (Recommend 30 minutes of walking everyday, or 3 hours every week) 2. Increase social interactions - continue going to [place] and enjoy social gatherings with friends and family 3. Eat healthy, avoid fried foods and eat more fruits and vegetables 4. Maintain adequate blood pressure, blood sugar, and blood cholesterol level. Reducing the risk of stroke and cardiovascular disease also helps promoting better memory. 5. Avoid stressful situations. Live a simple life and avoid aggravations. Organize your time and prepare for the next day in  anticipation. 6. Sleep well, avoid any interruptions of sleep and avoid any distractions in the bedroom that may interfere with adequate sleep quality 7. Avoid sugar, avoid sweets as there is a strong link between excessive sugar intake, diabetes, and cognitive impairment We discussed the Mediterranean diet, which has been shown to help patients reduce the risk of progressive memory disorders and reduces cardiovascular risk. This includes eating fish, eat fruits and green leafy vegetables, nuts like almonds and hazelnuts, walnuts, and also use olive oil. Avoid fast foods and fried foods as much as possible. Avoid sweets and sugar as sugar use has been linked to worsening of memory function.  There is always a concern of gradual progression of memory problems. If this is the case, then we may need to adjust level of care according to patient needs. Support, both to the patient and caregiver, should then be put into place.    The Alzheimer's Association is here all day, every day for people facing Alzheimer's disease through our free 24/7 Helpline: [phone]. The Helpline provides reliable information and support to all those who need assistance, such as individuals living with memory loss, Alzheimer's or other dementia, caregivers, health care professionals and the public.  Our highly trained and knowledgeable staff can help you with: Understanding memory loss, dementia and Alzheimer's  Medications and other treatment options  General information about aging and brain health  Skills to provide quality care and to find the best care from professionals  Legal, financial and living-arrangement decisions Our Helpline also features: Confidential care consultation provided by master's level clinicians who can help with decision-making support, crisis assistance and education on  issues families face every day  Help in a caller's preferred language using our translation service that features more than 200  languages and dialects  Referrals to local community programs, services and ongoing support     FALL PRECAUTIONS: Be cautious when walking. Scan the area for obstacles that may increase the risk of trips and falls. When getting up in the mornings, sit up at the edge of the bed for a few minutes before getting out of bed. Consider elevating the bed at the head end to avoid drop of blood pressure when getting up. Walk always in a well-lit room (use night lights in the walls). Avoid area rugs or power cords from appliances in the middle of the walkways. Use a walker or a cane if necessary and consider physical therapy for balance exercise. Get your eyesight checked regularly.  FINANCIAL OVERSIGHT: Supervision, especially oversight when making financial decisions or transactions is also recommended.  HOME SAFETY: Consider the safety of the kitchen when operating appliances like stoves, microwave oven, and blender. Consider having supervision and share cooking responsibilities until no longer able to participate in those. Accidents with firearms and other hazards in the house should be identified and addressed as well.   ABILITY TO BE LEFT ALONE: If patient is unable to contact 911 operator, consider using LifeLine, or when the need is there, arrange for someone to stay with patients. Smoking is a fire hazard, consider supervision or cessation. Risk of wandering should be assessed by caregiver and if detected at any point, supervision and safe proof recommendations should be instituted.  MEDICATION SUPERVISION: Inability to self-administer medication needs to be constantly addressed. Implement a mechanism to ensure safe administration of the medications.   DRIVING: Regarding driving, in patients with progressive memory problems, driving will be impaired. We advise to have someone else do the driving if trouble finding directions or if minor accidents are reported. Independent driving assessment is  available to determine safety of driving.   If you are interested in the driving assessment, you can contact the following:  [employer] in [place] [phone]  [facility] [phone]  [facility] [phone]  [facility] [phone] or [phone]      Mediterranean Diet A Mediterranean diet refers to food and lifestyle choices that are based on the traditions of countries located on the [place] Sea. This way of eating has been shown to help prevent certain conditions and improve outcomes for people who have chronic diseases, like kidney disease and heart disease. What are tips for following this plan? Lifestyle  Cook and eat meals together with your family, when possible. Drink enough fluid to keep your urine clear or pale yellow. Be physically active every day. This includes: Aerobic exercise like running or swimming. Leisure activities like gardening, walking, or housework. Get 7-8 hours of sleep each night. If recommended by your health care provider, drink red wine in moderation. This means 1 glass a day for nonpregnant women and 2 glasses a day for men. A glass of wine equals 5 oz (150 mL). Reading food labels  Check the serving size of packaged foods. For foods such as rice and pasta, the serving size refers to the amount of cooked product, not dry. Check the total fat in packaged foods. Avoid foods that have saturated fat or trans fats. Check the ingredients list for added sugars, such as corn syrup. Shopping  At the grocery store, buy most of your food from the areas near the   walls of the store. This includes: Fresh fruits and vegetables (produce). Grains, beans, nuts, and seeds. Some of these may be available in unpackaged forms or large amounts (in bulk). Fresh seafood. Poultry and eggs. Low-fat dairy products. Buy whole ingredients instead of prepackaged foods. Buy fresh fruits and vegetables in-season  from local farmers markets. Buy frozen fruits and vegetables in resealable bags. If you do not have access to quality fresh seafood, buy precooked frozen shrimp or canned fish, such as tuna, salmon, or sardines. Buy small amounts of raw or cooked vegetables, salads, or olives from the deli or salad bar at your store. Stock your pantry so you always have certain foods on hand, such as olive oil, canned tuna, canned tomatoes, rice, pasta, and beans. Cooking  Cook foods with extra-virgin olive oil instead of using butter or other vegetable oils. Have meat as a side dish, and have vegetables or grains as your main dish. This means having meat in small portions or adding small amounts of meat to foods like pasta or stew. Use beans or vegetables instead of meat in common dishes like chili or lasagna. Experiment with different cooking methods. Try roasting or broiling vegetables instead of steaming or sauteing them. Add frozen vegetables to soups, stews, pasta, or rice. Add nuts or seeds for added healthy fat at each meal. You can add these to yogurt, salads, or vegetable dishes. Marinate fish or vegetables using olive oil, lemon juice, garlic, and fresh herbs. Meal planning  Plan to eat 1 vegetarian meal one day each week. Try to work up to 2 vegetarian meals, if possible. Eat seafood 2 or more times a week. Have healthy snacks readily available, such as: Vegetable sticks with hummus. Greek yogurt. Fruit and nut trail mix. Eat balanced meals throughout the week. This includes: Fruit: 2-3 servings a day Vegetables: 4-5 servings a day Low-fat dairy: 2 servings a day Fish, poultry, or lean meat: 1 serving a day Beans and legumes: 2 or more servings a week Nuts and seeds: 1-2 servings a day Whole grains: 6-8 servings a day Extra-virgin olive oil: 3-4 servings a day Limit red meat and sweets to only a few servings a month What are my food choices? Mediterranean diet Recommended Grains:  Whole-grain pasta. Brown rice. Bulgar wheat. Polenta. Couscous. Whole-wheat bread. [place]. Vegetables: Artichokes. Beets. Broccoli. Cabbage. Carrots. Eggplant. Green beans. Chard. Kale. Spinach. Onions. Leeks. Peas. Squash. Tomatoes. Peppers. Radishes. Fruits: Apples. Apricots. Avocado. Berries. Bananas. Cherries. Dates. Figs. Grapes. Lemons. Melon. Oranges. Peaches. Plums. Pomegranate. Meats and other protein foods: Beans. Almonds. Sunflower seeds. Pine nuts. Peanuts. [place]. Salmon. Scallops. Shrimp. [place]. Tilapia. Clams. Oysters. Eggs. Dairy: Low-fat milk. Cheese. Greek yogurt. Beverages: Water. Red wine. Herbal tea. Fats and oils: Extra virgin olive oil. Avocado oil. Grape seed oil. Sweets and desserts: [place] yogurt with honey. Baked apples. Poached pears. Trail mix. Seasoning and other foods: Basil. Cilantro. Coriander. Cumin. Mint. Parsley. Sage. Rosemary. Tarragon. Garlic. Oregano. Thyme. Pepper. Balsalmic vinegar. Tahini. Hummus. Tomato sauce. Olives. Mushrooms. Limit these Grains: Prepackaged pasta or rice dishes. Prepackaged cereal with added sugar. Vegetables: Deep fried potatoes (french fries). Fruits: Fruit canned in syrup. Meats and other protein foods: Beef. Pork. Lamb. Poultry with skin. Hot dogs. [place] Salines. Dairy: Ice cream. Sour cream. Whole milk. Beverages: Juice. Sugar-sweetened soft drinks. Beer. Liquor and spirits. Fats and oils: Butter. Canola oil. Vegetable oil. Beef fat (tallow). Lard. Sweets and desserts: Cookies. Cakes. Pies. Candy. Seasoning and other foods: Mayonnaise. Premade sauces and marinades. The items  listed may not be a complete list. Talk with your dietitian about what dietary choices are right for you. Summary The Mediterranean diet includes both food and lifestyle choices. Eat a variety of fresh fruits and vegetables, beans, nuts, seeds, and whole grains. Limit the amount of red meat and sweets that you eat. Talk with your health care provider about  whether it is safe for you to drink red wine in moderation. This means 1 glass a day for nonpregnant women and 2 glasses a day for men. A glass of wine equals 5 oz (150 mL). This information is not intended to replace advice given to you by your health care provider. Make sure you discuss any questions you have with your health care provider. Document Released: 03/24/2016 Document Revised: 04/26/2016 Document Reviewed: 03/24/2016 Elsevier Interactive Patient Education  2017 [employer] Inc.     

## 2022-04-20 ENCOUNTER — Other Ambulatory Visit: Payer: Self-pay | Admitting: Physician Assistant

## 2022-04-20 ENCOUNTER — Telehealth: Payer: Self-pay

## 2022-04-20 DIAGNOSIS — F02818 Dementia in other diseases classified elsewhere, unspecified severity, with other behavioral disturbance: Secondary | ICD-10-CM | POA: Insufficient documentation

## 2022-04-20 MED ORDER — B-12 1000 MCG PO TABS: 1000.0000 ug | ORAL_TABLET | Freq: Every day | ORAL | 11 refills | Status: DC

## 2022-04-20 NOTE — Progress Notes (Signed)
B12 is on the lower side, needs to take 1 tab 1000 mcg daily and follow with PCP. TSH is normal thanks

## 2022-04-20 NOTE — Telephone Encounter (Signed)
Error

## 2022-04-27 DIAGNOSIS — F03B0 Unspecified dementia, moderate, without behavioral disturbance, psychotic disturbance, mood disturbance, and anxiety: Secondary | ICD-10-CM | POA: Diagnosis not present

## 2022-04-27 DIAGNOSIS — F32A Depression, unspecified: Secondary | ICD-10-CM | POA: Diagnosis not present

## 2022-04-28 NOTE — Telephone Encounter (Signed)
Called Benjamin Mason and every thing has been done for her father.

## 2022-04-28 NOTE — Progress Notes (Signed)
Remote pacemaker transmission.   

## 2022-05-18 DIAGNOSIS — N390 Urinary tract infection, site not specified: Secondary | ICD-10-CM | POA: Diagnosis not present

## 2022-05-19 DIAGNOSIS — R059 Cough, unspecified: Secondary | ICD-10-CM | POA: Diagnosis not present

## 2022-05-24 DIAGNOSIS — N183 Chronic kidney disease, stage 3 unspecified: Secondary | ICD-10-CM | POA: Diagnosis not present

## 2022-05-24 DIAGNOSIS — E782 Mixed hyperlipidemia: Secondary | ICD-10-CM | POA: Diagnosis not present

## 2022-05-24 DIAGNOSIS — K580 Irritable bowel syndrome with diarrhea: Secondary | ICD-10-CM | POA: Diagnosis not present

## 2022-05-24 DIAGNOSIS — I130 Hypertensive heart and chronic kidney disease with heart failure and stage 1 through stage 4 chronic kidney disease, or unspecified chronic kidney disease: Secondary | ICD-10-CM | POA: Diagnosis not present

## 2022-05-24 DIAGNOSIS — I25811 Atherosclerosis of native coronary artery of transplanted heart without angina pectoris: Secondary | ICD-10-CM | POA: Diagnosis not present

## 2022-05-24 DIAGNOSIS — I480 Paroxysmal atrial fibrillation: Secondary | ICD-10-CM | POA: Diagnosis not present

## 2022-05-24 DIAGNOSIS — I10 Essential (primary) hypertension: Secondary | ICD-10-CM | POA: Diagnosis not present

## 2022-05-27 DIAGNOSIS — E559 Vitamin D deficiency, unspecified: Secondary | ICD-10-CM | POA: Diagnosis not present

## 2022-05-27 DIAGNOSIS — E039 Hypothyroidism, unspecified: Secondary | ICD-10-CM | POA: Diagnosis not present

## 2022-05-27 DIAGNOSIS — Z79899 Other long term (current) drug therapy: Secondary | ICD-10-CM | POA: Diagnosis not present

## 2022-05-27 DIAGNOSIS — E119 Type 2 diabetes mellitus without complications: Secondary | ICD-10-CM | POA: Diagnosis not present

## 2022-05-30 DIAGNOSIS — H401113 Primary open-angle glaucoma, right eye, severe stage: Secondary | ICD-10-CM | POA: Diagnosis not present

## 2022-06-02 ENCOUNTER — Emergency Department (HOSPITAL_COMMUNITY): Payer: Medicare Other

## 2022-06-02 ENCOUNTER — Inpatient Hospital Stay (HOSPITAL_COMMUNITY)
Admission: EM | Admit: 2022-06-02 | Discharge: 2022-06-07 | DRG: 083 | Disposition: A | Discharge status: Home Health | Payer: Medicare Other | Source: Skilled Nursing Facility | Attending: Internal Medicine | Admitting: Internal Medicine

## 2022-06-02 ENCOUNTER — Other Ambulatory Visit: Payer: Self-pay

## 2022-06-02 ENCOUNTER — Encounter (HOSPITAL_COMMUNITY): Payer: Self-pay | Admitting: Emergency Medicine

## 2022-06-02 ENCOUNTER — Encounter (HOSPITAL_COMMUNITY): Payer: Self-pay

## 2022-06-02 DIAGNOSIS — N1832 Chronic kidney disease, stage 3b: Secondary | ICD-10-CM | POA: Diagnosis not present

## 2022-06-02 DIAGNOSIS — R296 Repeated falls: Secondary | ICD-10-CM | POA: Diagnosis present

## 2022-06-02 DIAGNOSIS — W19XXXA Unspecified fall, initial encounter: Secondary | ICD-10-CM | POA: Diagnosis not present

## 2022-06-02 DIAGNOSIS — S0634AA Traumatic hemorrhage of right cerebrum with loss of consciousness status unknown, initial encounter: Principal | ICD-10-CM

## 2022-06-02 DIAGNOSIS — D539 Nutritional anemia, unspecified: Secondary | ICD-10-CM | POA: Diagnosis present

## 2022-06-02 DIAGNOSIS — Z85828 Personal history of other malignant neoplasm of skin: Secondary | ICD-10-CM

## 2022-06-02 DIAGNOSIS — K219 Gastro-esophageal reflux disease without esophagitis: Secondary | ICD-10-CM | POA: Diagnosis not present

## 2022-06-02 DIAGNOSIS — Z87891 Personal history of nicotine dependence: Secondary | ICD-10-CM

## 2022-06-02 DIAGNOSIS — Z7901 Long term (current) use of anticoagulants: Secondary | ICD-10-CM | POA: Diagnosis not present

## 2022-06-02 DIAGNOSIS — N183 Chronic kidney disease, stage 3 unspecified: Secondary | ICD-10-CM | POA: Diagnosis not present

## 2022-06-02 DIAGNOSIS — Z743 Need for continuous supervision: Secondary | ICD-10-CM | POA: Diagnosis not present

## 2022-06-02 DIAGNOSIS — I129 Hypertensive chronic kidney disease with stage 1 through stage 4 chronic kidney disease, or unspecified chronic kidney disease: Secondary | ICD-10-CM | POA: Diagnosis not present

## 2022-06-02 DIAGNOSIS — Y9301 Activity, walking, marching and hiking: Secondary | ICD-10-CM | POA: Diagnosis present

## 2022-06-02 DIAGNOSIS — I619 Nontraumatic intracerebral hemorrhage, unspecified: Secondary | ICD-10-CM | POA: Diagnosis not present

## 2022-06-02 DIAGNOSIS — F32A Depression, unspecified: Secondary | ICD-10-CM | POA: Diagnosis not present

## 2022-06-02 DIAGNOSIS — E1122 Type 2 diabetes mellitus with diabetic chronic kidney disease: Secondary | ICD-10-CM | POA: Diagnosis not present

## 2022-06-02 DIAGNOSIS — S065XAA Traumatic subdural hemorrhage with loss of consciousness status unknown, initial encounter: Principal | ICD-10-CM | POA: Diagnosis present

## 2022-06-02 DIAGNOSIS — I252 Old myocardial infarction: Secondary | ICD-10-CM

## 2022-06-02 DIAGNOSIS — R58 Hemorrhage, not elsewhere classified: Secondary | ICD-10-CM | POA: Diagnosis not present

## 2022-06-02 DIAGNOSIS — Y92099 Unspecified place in other non-institutional residence as the place of occurrence of the external cause: Secondary | ICD-10-CM

## 2022-06-02 DIAGNOSIS — Y92009 Unspecified place in unspecified non-institutional (private) residence as the place of occurrence of the external cause: Secondary | ICD-10-CM | POA: Diagnosis not present

## 2022-06-02 DIAGNOSIS — I482 Chronic atrial fibrillation, unspecified: Secondary | ICD-10-CM | POA: Diagnosis not present

## 2022-06-02 DIAGNOSIS — Z79899 Other long term (current) drug therapy: Secondary | ICD-10-CM | POA: Diagnosis not present

## 2022-06-02 DIAGNOSIS — R4182 Altered mental status, unspecified: Secondary | ICD-10-CM

## 2022-06-02 DIAGNOSIS — I4821 Permanent atrial fibrillation: Secondary | ICD-10-CM | POA: Diagnosis not present

## 2022-06-02 DIAGNOSIS — E782 Mixed hyperlipidemia: Secondary | ICD-10-CM | POA: Diagnosis present

## 2022-06-02 DIAGNOSIS — Z8249 Family history of ischemic heart disease and other diseases of the circulatory system: Secondary | ICD-10-CM | POA: Diagnosis not present

## 2022-06-02 DIAGNOSIS — F0153 Vascular dementia, unspecified severity, with mood disturbance: Secondary | ICD-10-CM | POA: Diagnosis present

## 2022-06-02 DIAGNOSIS — N400 Enlarged prostate without lower urinary tract symptoms: Secondary | ICD-10-CM | POA: Diagnosis not present

## 2022-06-02 DIAGNOSIS — I10 Essential (primary) hypertension: Secondary | ICD-10-CM | POA: Diagnosis not present

## 2022-06-02 DIAGNOSIS — S02119A Unspecified fracture of occiput, initial encounter for closed fracture: Secondary | ICD-10-CM | POA: Diagnosis not present

## 2022-06-02 DIAGNOSIS — Z950 Presence of cardiac pacemaker: Secondary | ICD-10-CM | POA: Diagnosis not present

## 2022-06-02 DIAGNOSIS — R410 Disorientation, unspecified: Secondary | ICD-10-CM

## 2022-06-02 DIAGNOSIS — I495 Sick sinus syndrome: Secondary | ICD-10-CM | POA: Diagnosis present

## 2022-06-02 DIAGNOSIS — I499 Cardiac arrhythmia, unspecified: Secondary | ICD-10-CM | POA: Diagnosis not present

## 2022-06-02 DIAGNOSIS — F0150 Vascular dementia without behavioral disturbance: Secondary | ICD-10-CM

## 2022-06-02 DIAGNOSIS — I4891 Unspecified atrial fibrillation: Secondary | ICD-10-CM | POA: Diagnosis not present

## 2022-06-02 DIAGNOSIS — R6889 Other general symptoms and signs: Secondary | ICD-10-CM | POA: Diagnosis not present

## 2022-06-02 DIAGNOSIS — I2510 Atherosclerotic heart disease of native coronary artery without angina pectoris: Secondary | ICD-10-CM | POA: Diagnosis present

## 2022-06-02 DIAGNOSIS — F01511 Vascular dementia, unspecified severity, with agitation: Secondary | ICD-10-CM | POA: Diagnosis not present

## 2022-06-02 DIAGNOSIS — S199XXA Unspecified injury of neck, initial encounter: Secondary | ICD-10-CM | POA: Diagnosis not present

## 2022-06-02 DIAGNOSIS — I6200 Nontraumatic subdural hemorrhage, unspecified: Secondary | ICD-10-CM | POA: Diagnosis not present

## 2022-06-02 DIAGNOSIS — W010XXA Fall on same level from slipping, tripping and stumbling without subsequent striking against object, initial encounter: Secondary | ICD-10-CM | POA: Diagnosis present

## 2022-06-02 DIAGNOSIS — R402412 Glasgow coma scale score 13-15, at arrival to emergency department: Secondary | ICD-10-CM | POA: Diagnosis present

## 2022-06-02 DIAGNOSIS — S065X0A Traumatic subdural hemorrhage without loss of consciousness, initial encounter: Secondary | ICD-10-CM | POA: Diagnosis not present

## 2022-06-02 DIAGNOSIS — I6523 Occlusion and stenosis of bilateral carotid arteries: Secondary | ICD-10-CM | POA: Diagnosis not present

## 2022-06-02 DIAGNOSIS — E739 Lactose intolerance, unspecified: Secondary | ICD-10-CM | POA: Diagnosis present

## 2022-06-02 DIAGNOSIS — Z5309 Procedure and treatment not carried out because of other contraindication: Secondary | ICD-10-CM | POA: Diagnosis not present

## 2022-06-02 LAB — CBC WITH DIFFERENTIAL/PLATELET
Abs Immature Granulocytes: 0.02 10*3/uL (ref 0.00–0.07)
Basophils Absolute: 0 10*3/uL (ref 0.0–0.1)
Basophils Relative: 0 %
Eosinophils Absolute: 0 10*3/uL (ref 0.0–0.5)
Eosinophils Relative: 0 %
HCT: 28.9 % — ABNORMAL LOW (ref 39.0–52.0)
Hemoglobin: 9.3 g/dL — ABNORMAL LOW (ref 13.0–17.0)
Immature Granulocytes: 0 %
Lymphocytes Relative: 5 %
Lymphs Abs: 0.3 10*3/uL — ABNORMAL LOW (ref 0.7–4.0)
MCH: 34.4 pg — ABNORMAL HIGH (ref 26.0–34.0)
MCHC: 32.2 g/dL (ref 30.0–36.0)
MCV: 107 fL — ABNORMAL HIGH (ref 80.0–100.0)
Monocytes Absolute: 0.5 10*3/uL (ref 0.1–1.0)
Monocytes Relative: 8 %
Neutro Abs: 5.2 10*3/uL (ref 1.7–7.7)
Neutrophils Relative %: 87 %
Platelets: 167 10*3/uL (ref 150–400)
RBC: 2.7 MIL/uL — ABNORMAL LOW (ref 4.22–5.81)
RDW: 12.6 % (ref 11.5–15.5)
WBC: 6 10*3/uL (ref 4.0–10.5)
nRBC: 0 % (ref 0.0–0.2)

## 2022-06-02 LAB — BASIC METABOLIC PANEL
Anion gap: 7 (ref 5–15)
BUN: 39 mg/dL — ABNORMAL HIGH (ref 8–23)
CO2: 23 mmol/L (ref 22–32)
Calcium: 8.7 mg/dL — ABNORMAL LOW (ref 8.9–10.3)
Chloride: 106 mmol/L (ref 98–111)
Creatinine, Ser: 1.86 mg/dL — ABNORMAL HIGH (ref 0.61–1.24)
GFR, Estimated: 34 mL/min — ABNORMAL LOW (ref >60)
Glucose, Bld: 164 mg/dL — ABNORMAL HIGH (ref 70–99)
Potassium: 4.1 mmol/L (ref 3.5–5.1)
Sodium: 136 mmol/L (ref 135–145)

## 2022-06-02 LAB — MAGNESIUM: Magnesium: 1.6 mg/dL — ABNORMAL LOW (ref 1.7–2.4)

## 2022-06-02 MED ORDER — HALOPERIDOL LACTATE 5 MG/ML IJ SOLN
2.0000 mg | Freq: Once | INTRAMUSCULAR | Status: AC
  Administered 2022-06-02: 2 mg via INTRAVENOUS
  Filled 2022-06-02: qty 1

## 2022-06-02 MED ORDER — MAGNESIUM SULFATE 2 GM/50ML IV SOLN
2.0000 g | Freq: Once | INTRAVENOUS | Status: AC
  Administered 2022-06-02: 2 g via INTRAVENOUS
  Filled 2022-06-02: qty 50

## 2022-06-02 MED ORDER — CHLORHEXIDINE GLUCONATE CLOTH 2 % EX PADS
6.0000 | MEDICATED_PAD | Freq: Every day | CUTANEOUS | Status: DC
  Administered 2022-06-03: 6 via TOPICAL

## 2022-06-02 MED ORDER — LACTATED RINGERS IV BOLUS
1000.0000 mL | Freq: Once | INTRAVENOUS | Status: AC
  Administered 2022-06-02: 1000 mL via INTRAVENOUS

## 2022-06-02 MED ORDER — EMPTY CONTAINERS FLEXIBLE MISC
900.0000 mg | Freq: Once | Status: AC
  Administered 2022-06-02: 900 mg via INTRAVENOUS
  Filled 2022-06-02: qty 40

## 2022-06-02 NOTE — H&P (Signed)
History and Physical    Patient: Benjamin Mason WNI:[id number] DOB: 06/15/1931 DOA: 06/02/2022 DOS: the patient was seen and examined on 06/03/2022 PCP: Manon Hilding, MD  Patient coming from: SNF  Chief Complaint:  Chief Complaint  Patient presents with   Fall   HPI: Benjamin Mason is a 88 y.o. male with medical history significant of vascular dementia, depression, BPH, GERD, SSS s/p pacemaker, A-fib on Eliquis implant who presents emergency department via EMS from nursing home facility due to an unwitnessed fall.  Patient was unable to provide history, history was obtained from ED physician and daughter at bedside.  Per report, patient lost balance while walking around in the facility and fell backwards hitting the back of his head with a bruise at the injured site.  Patient only complained of minor headache.  At baseline, patient was able to ambulate with a cane and maintain some conversation. EMS was activated and patient was sent to the ED for further evaluation and management.  ED Course:  In the emergency department, BP was 146/71 on arrival and other vital signs are within normal range.  Work-up in the ED showed macrocytic anemia, hypomagnesemia, BUN/creatinine 39/1.86 (baseline creatinine at 1.8-1.9), magnesium 1.6 CT head and CT cervical spine without contrast showed: 1. Nondisplaced fracture of the occipital calvarium. 2. Small right posterior parafalcine and right supratentorial subdural hemorrhage. No mass effect or midline shift. 3. Moderate age-related atrophy and chronic microvascular ischemic changes. 4. No acute/traumatic cervical spine pathology. Andexxa was given.  Haloperidol was given due to agitation, magnesium was replenished and IV hydration was provided.  Dr. Glenford Peers with neurosurgery was consulted and recommended medical management at this time with plan to repeat CT scan in the morning.  Hospitalist was asked to admit patient for further evaluation and  management.  Review of Systems: Review of systems as noted in the HPI. All other systems reviewed and are negative.   Past Medical History:  Diagnosis Date   Anxiety    Aortic regurgitation    Mild to moderate   CKD (chronic kidney disease) stage 3, GFR 30-59 ml/min (HCC)    Coronary atherosclerosis of native coronary artery    BMS to RCA and LAD 1995   Essential hypertension    Frequent PVCs    IBS (irritable bowel syndrome)    Lactose intolerance    Mitral regurgitation    Moderate   Mixed hyperlipidemia    Permanent atrial fibrillation [facility])    Sick sinus syndrome [facility])    [employer] single-chamber pacemaker May 2021 - Dr. Caryl Comes   Skin cancer    Past Surgical History:  Procedure Laterality Date   BALLOON DILATION  08/03/2012   Procedure: BALLOON DILATION;  Surgeon: Rogene Houston, MD;  [facility];  Service: Endoscopy;  Laterality: N/A;   COLONOSCOPY WITH ESOPHAGOGASTRODUODENOSCOPY (EGD)  08/03/2012   Procedure: COLONOSCOPY WITH ESOPHAGOGASTRODUODENOSCOPY (EGD);  Surgeon: Rogene Houston, MD;  [facility];  Service: Endoscopy;  Laterality: N/A;  215   LEFT HEART CATH AND CORONARY ANGIOGRAPHY N/A 11/18/2016   Procedure: Left Heart Cath and Coronary Angiography;  Surgeon: Peter M Martinique, MD;  Location: [place] CV LAB;  Service: Cardiovascular;  Laterality: N/A;   LEFT INGUINAL HERNIA     MALONEY DILATION  08/03/2012   Procedure: MALONEY DILATION;  Surgeon: Rogene Houston, MD;  [facility];  Service: Endoscopy;  Laterality: N/A;   PACEMAKER IMPLANT N/A 12/30/2019   Procedure: PACEMAKER  IMPLANT;  Surgeon: Deboraha Sprang, MD;  Location: [place] CV LAB;  Service: Cardiovascular;  Laterality: N/A;   PENILE PROSTHESIS IMPLANT     SAVORY DILATION  08/03/2012   Procedure: SAVORY DILATION;  Surgeon: Rogene Houston, MD;  [facility];  Service: Endoscopy;  Laterality: N/A;    Social History:  reports that he quit smoking about  59 years ago. His smoking use included cigarettes. He has a 50.00 pack-year smoking history. He has never used smokeless tobacco. He reports that he does not drink alcohol and does not use drugs.   No Known Allergies  Family History  Problem Relation Age of Onset   Heart attack Father        in his early 59's     Prior to Admission medications   Medication Sig Start Date End Date Taking? Authorizing Provider  acetaminophen (TYLENOL) 650 MG CR tablet Take 1,300 mg by mouth 2 (two) times daily.   Yes [provider]  brimonidine (ALPHAGAN) 0.2 % ophthalmic solution 1 drop 2 (two) times daily. 01/25/22  Yes [provider]  Cyanocobalamin (B-12) 1000 MCG TABS Take 1 tablet (1,000 mcg total) by mouth daily. 04/20/22  Yes Rondel Jumbo, PA-C  donepezil (ARICEPT) 10 MG tablet Take 10 mg by mouth at bedtime. 09/07/21  Yes [provider]  ELIQUIS 2.5 MG TABS tablet TAKE ONE TABLET BY MOUTH TWICE DAILY 02/14/22  Yes Satira Sark, MD  finasteride (PROSCAR) 5 MG tablet Take 5 mg by mouth daily.     Yes [provider]  loratadine (CLARITIN) 10 MG tablet Take 10 mg by mouth daily.   Yes [provider]  midodrine (PROAMATINE) 5 MG tablet Take 2 tablets in the morning and one tablet in the evening. 01/26/22  Yes Satira Sark, MD  pantoprazole (PROTONIX) 40 MG tablet Take 40 mg by mouth daily.  01/17/19  Yes [provider]  sertraline (ZOLOFT) 50 MG tablet Take 50 mg by mouth every morning. 05/09/22  Yes [provider]  ciprofloxacin (CIPRO) 500 MG tablet Take 500 mg by mouth 2 (two) times daily. Patient not taking: Reported on 06/02/2022    [provider]  neomycin-polymyxin b-dexamethasone (MAXITROL) 3.5-10000-0.1 SUSP Place into the right eye. Patient not taking: Reported on 06/02/2022 05/30/22   [provider]  nitroGLYCERIN (NITROSTAT) 0.4 MG SL tablet Place 1 tablet (0.4 mg total) under the tongue every 5  (five) minutes x 3 doses as needed for chest pain (if no relief after 3rd dose, proceed to the ED for an evaluation or call 911). PLACE ONE TABLET UNDER THE TONGUE EVERY 5 MINUTES AS NEEDED FOR CHEST PAIN Patient not taking: Reported on 06/02/2022 03/18/20   Satira Sark, MD  sertraline (ZOLOFT) 25 MG tablet Take 25 mg by mouth every morning. Patient not taking: Reported on 06/02/2022 09/07/21   [provider]    Physical Exam: BP (!) 140/90   Pulse 87   Temp 98 F (36.7 C) (Axillary)   Resp 18   Ht '6\' 3"'$  (1.905 m)   SpO2 96%   BMI 19.62 kg/m   General: 88 y.o. year-old male frail, ill appearing, but in no acute distress.  Alert and oriented x 1 (person). HEENT: NCAT, EOMI Neck: Supple, trachea medial Cardiovascular: Tachycardia.  Irregular rate and rhythm with no rubs or gallops.  No thyromegaly or JVD noted.  No lower extremity edema. 2/4 pulses in all 4 extremities. Respiratory: Clear  to auscultation with no wheezes or rales. Good inspiratory effort. Abdomen: Soft, nontender nondistended with normal bowel sounds x4 quadrants. Muskuloskeletal: No cyanosis, clubbing or edema noted bilaterally Neuro: Patient was unable to follow commands.  Sensation, reflexes intact Skin: No ulcerative lesions noted or rashes Psychiatry:Mood is appropriate for condition and setting          Labs on Admission:  Basic Metabolic Panel: Recent Labs  Lab 06/02/22 2145  NA 136  K 4.1  CL 106  CO2 23  GLUCOSE 164*  BUN 39*  CREATININE 1.86*  CALCIUM 8.7*  MG 1.6*   Liver Function Tests: No results for input(s): "AST", "ALT", "ALKPHOS", "BILITOT", "PROT", "ALBUMIN" in the last 168 hours. No results for input(s): "LIPASE", "AMYLASE" in the last 168 hours. No results for input(s): "AMMONIA" in the last 168 hours. CBC: Recent Labs  Lab 06/02/22 2145  WBC 6.0  NEUTROABS 5.2  HGB 9.3*  HCT 28.9*  MCV 107.0*  PLT 167   Cardiac Enzymes: No results for input(s): "CKTOTAL",  "CKMB", "CKMBINDEX", "TROPONINI" in the last 168 hours.  BNP (last 3 results) No results for input(s): "BNP" in the last 8760 hours.  ProBNP (last 3 results) No results for input(s): "PROBNP" in the last 8760 hours.  CBG: No results for input(s): "GLUCAP" in the last 168 hours.  Radiological Exams on Admission: CT HEAD WO CONTRAST (5MM)  Result Date: 06/02/2022 CLINICAL DATA:  Trauma. EXAM: CT HEAD WITHOUT CONTRAST CT CERVICAL SPINE WITHOUT CONTRAST TECHNIQUE: Multidetector CT imaging of the head and cervical spine was performed following the standard protocol without intravenous contrast. Multiplanar CT image reconstructions of the cervical spine were also generated. RADIATION DOSE REDUCTION: This exam was performed according to the departmental dose-optimization program which includes automated exposure control, adjustment of the mA and/or kV according to patient size and/or use of iterative reconstruction technique. COMPARISON:  Head CT dated 12/21/2021. FINDINGS: CT HEAD FINDINGS Brain: Moderate age-related atrophy and chronic microvascular ischemic changes. Small right posterior parafalcine subdural hemorrhage measures 3 mm in thickness. Additional small subdural hemorrhage noted along the right tentorium measuring 6 mm in thickness. No mass effect midline shift. Vascular: No hyperdense vessel or unexpected calcification. Skull: Nondisplaced fracture of the occipital calvarium. Sinuses/Orbits: No acute finding. Other: None CT CERVICAL SPINE FINDINGS Alignment: No acute subluxation. Skull base and vertebrae: No acute fracture. Soft tissues and spinal canal: No prevertebral fluid or swelling. No visible canal hematoma. Disc levels:  No acute findings.  Multilevel degenerative changes. Upper chest: Negative. Other: Bilateral carotid bulb calcified plaques. IMPRESSION: 1. Nondisplaced fracture of the occipital calvarium. 2. Small right posterior parafalcine and right supratentorial subdural  hemorrhage. No mass effect or midline shift. 3. Moderate age-related atrophy and chronic microvascular ischemic changes. 4. No acute/traumatic cervical spine pathology. These results were called by telephone at the time of interpretation on 06/02/2022 at 7:10 pm to provider [facility] , who verbally acknowledged these results. Electronically Signed   By: Anner Crete M.D.   On: 06/02/2022 19:16   CT CERVICAL SPINE WO CONTRAST  Result Date: 06/02/2022 CLINICAL DATA:  Trauma. EXAM: CT HEAD WITHOUT CONTRAST CT CERVICAL SPINE WITHOUT CONTRAST TECHNIQUE: Multidetector CT imaging of the head and cervical spine was performed following the standard protocol without intravenous contrast. Multiplanar CT image reconstructions of the cervical spine were also generated. RADIATION DOSE REDUCTION: This exam was performed according to the departmental dose-optimization program which includes automated exposure control, adjustment of the mA and/or kV according to patient size  and/or use of iterative reconstruction technique. COMPARISON:  Head CT dated 12/21/2021. FINDINGS: CT HEAD FINDINGS Brain: Moderate age-related atrophy and chronic microvascular ischemic changes. Small right posterior parafalcine subdural hemorrhage measures 3 mm in thickness. Additional small subdural hemorrhage noted along the right tentorium measuring 6 mm in thickness. No mass effect midline shift. Vascular: No hyperdense vessel or unexpected calcification. Skull: Nondisplaced fracture of the occipital calvarium. Sinuses/Orbits: No acute finding. Other: None CT CERVICAL SPINE FINDINGS Alignment: No acute subluxation. Skull base and vertebrae: No acute fracture. Soft tissues and spinal canal: No prevertebral fluid or swelling. No visible canal hematoma. Disc levels:  No acute findings.  Multilevel degenerative changes. Upper chest: Negative. Other: Bilateral carotid bulb calcified plaques. IMPRESSION: 1. Nondisplaced fracture of the occipital  calvarium. 2. Small right posterior parafalcine and right supratentorial subdural hemorrhage. No mass effect or midline shift. 3. Moderate age-related atrophy and chronic microvascular ischemic changes. 4. No acute/traumatic cervical spine pathology. These results were called by telephone at the time of interpretation on 06/02/2022 at 7:10 pm to provider [facility] , who verbally acknowledged these results. Electronically Signed   By: Anner Crete M.D.   On: 06/02/2022 19:16    EKG: I independently viewed the EKG done and my findings are as followed: A-fib with RVR  Assessment/Plan Present on Admission:  Subdural hemorrhage ([place])  CKD (chronic kidney disease), stage III (HCC)  GERD (gastroesophageal reflux disease)  Principal Problem:   Subdural hemorrhage ([place]) Active Problems:   GERD (gastroesophageal reflux disease)   CKD (chronic kidney disease), stage III ([place])   Fall at home, initial encounter   Altered mental status   Hypomagnesemia   Macrocytic anemia   Chronic atrial fibrillation with RVR (HCC)   Vascular dementia (HCC)   Depression   BPH (benign prostatic hyperplasia)  Subdural hemorrhage s/p post Fall CT head without contrast showed nondisplaced  fracture of the occipital calvarium. Neurosurgery was consulted and recommended medical management and to repeat CT of head in the morning Continue fall precaution Continue PT/OT eval and treat Factor Xa recombinant was given to reverse Eliquis  Altered mental status possibly secondary to above Continue management as described above  Hypomagnesemia Mg 1.6, this was replenished Continue to monitor magnesium level and replenish as needed  Chronic A-fib with RVR EKG personally reviewed showed A-fib with RVR Eliquis will be held at this time due to subdural hemorrhage Continue amiodarone drip with plan to transition show oral rate control when HR is better controlled  Macrocytic anemia Vitamin B12 and folate  levels will be checked  Vascular dementia Continue Aricept when patient resumes oral intake  Depression Continue Zoloft when patient resumes oral intake   BPH Continue Proscar when patient resumes oral intake  GERD Continue IV Protonix   DVT prophylaxis: SCDs  Code Status: Full code  Consults: None  Family Communication: Daughter and son-in-law at bedside  Severity of Illness: The appropriate patient status for this patient is OBSERVATION. Observation status is judged to be reasonable and necessary in order to provide the required intensity of service to ensure the patient's safety. The patient's presenting symptoms, physical exam findings, and initial radiographic and laboratory data in the context of their medical condition is felt to place them at decreased risk for further clinical deterioration. Furthermore, it is anticipated that the patient will be medically stable for discharge from the hospital within 2 midnights of admission.   Author: Bernadette Hoit, DO 06/03/2022 3:23 AM  For on call review [email or web address].

## 2022-06-02 NOTE — ED Notes (Signed)
AC notified to get andexxa

## 2022-06-02 NOTE — ED Triage Notes (Signed)
Pt BIB [facility] from [facility] with reports of unwitnessed fall. Pt taking eliquis.

## 2022-06-02 NOTE — ED Provider Notes (Signed)
[facility] Provider Note   CSN: [id number] Arrival date & time: 06/02/22  1820     History No chief complaint on file.   HPI AJANI RINEER is a 88 y.o. male presenting for fall from his skilled nursing facility.  Per EMS, patient was walking around the facility when he lost balance and fell backwards hit the back of his head and has a bruise at that location.  He is on Eliquis for A-fib.  He denies fevers or chills, nausea vomiting syncope shortness breath.  Patient has no other complaint besides a minor headache.  No known sick contacts.   Patient's recorded medical, surgical, social, medication list and allergies were reviewed in the Snapshot window as part of the initial history.   Review of Systems   Review of Systems  Unable to perform ROS: Dementia    Physical Exam Updated Vital Signs There were no vitals taken for this visit. Physical Exam Physical Exam  Neurologic: GCS 14, motor intact in all four extremities, sensory intact in all 4 extremities  Head: Pupils are 76m, equally round and reactive to light, patient has no obvious facial trauma, no hemotympanum  Neck: patient has no midline neck tenderness, no obvious injuries.  Thorax: Patient has stable clavicles, stable thorax with bilateral chest rise and breath sounds heard.  No penetrating thoracic injury.  CV/Pulm: RRR, no audible murmer/rubs/gallops, CTAB  Abdomen: Patient has no abdominal distention, no penetrating abdominal injury.  Back: Patient has no midline spinal tenderness in the thoracic and lumbar spine, patient has no paraspinal tenderness bilaterally.  Pelvis: Patient has a stable pelvis to compression with palpable femoral pulses.  Extremities:Patient's upper extremities with no obvious injury or abnormality, radial pulses present. Patient's lower extremities with no obvious injury or abnormality, tibial pulses present.    ED Course/ Medical Decision Making/ A&P     Procedures .Critical Care  Performed by: CTretha Sciara MD Authorized by: CTretha Sciara MD   Critical care provider statement:    Critical care time (minutes):  30   Critical care was necessary to treat or prevent imminent or life-threatening deterioration of the following conditions:  CNS failure or compromise and trauma   Critical care was time spent personally by me on the following activities:  Development of treatment plan with patient or surrogate, discussions with consultants, evaluation of patient's response to treatment, examination of patient, ordering and review of laboratory studies, ordering and review of radiographic studies, ordering and performing treatments and interventions, pulse oximetry, re-evaluation of patient's condition and review of old charts    Medications Ordered in ED Medications - No data to display Medical Decision Making:    RJOVONI BORKENHAGENis a 88y.o. male who presented to the ED today with a moderate mechanisma trauma, detailed above.    Handoff received from EMS.  Additional history discussed with patient's family/caregivers.  Patient placed on continuous vitals and telemetry monitoring while in ED which was reviewed periodically.   Given this mechanism of trauma, a full physical exam was performed. Notably, patient was HDS in NAD GCS 14.   Reviewed and confirmed nursing documentation for past medical history, family history, social history.    Initial Assessment/Plan:   This is a patient presenting with a moderate mechanism trauma.  As such, I have considered intracranial injuries including intracranial hemorrhage, intrathoracic injuries including blunt myocardial or blunt lung injury, blunt abdominal injuries including aortic dissection, bladder injury, spleen injury, liver injury and I have considered  orthopedic injuries including extremity or spinal injury.  With the patient's presentation of moderate mechanism trauma but an  otherwise reassuring exam, patient warrants targeted evaluation for potential traumatic injuries. Will proceed with targeted evaluation for potential injuries. Will proceed with CT head, CT C-spine. Objective evaluation resulted with acute subdural hemorrhage and calvarial fracture.  Consulted Dr. Glenford Peers With neurosurgery who agreed with Eliquis reversal, medical management at this time with plan for repeat CT scan in the morning. Patient admitted to medicine for ongoing care and management.No further acute events.  Disposition:   Based on the above findings, I believe this patient is stable for admission.    Patient/family educated about specific findings on our evaluation and explained exact reasons for admission.  Patient/family educated about clinical situation and time was allowed to answer questions.   Admission team communicated with and agreed with need for admission. Patient admitted. Patient  ready to move at this time.     Emergency Department Medication Summary:   Medications  magnesium sulfate IVPB 2 g 50 mL (2 g Intravenous New Bag/Given 06/02/22 2356)  Chlorhexidine Gluconate Cloth 2 % PADS 6 each (has no administration in time range)  coag fact Xa recombinant (ANDEXXA) low dose infusion 900 mg (900 mg Intravenous New Bag/Given 06/02/22 2035)  haloperidol lactate (HALDOL) injection 2 mg (2 mg Intravenous Given 06/02/22 2043)  haloperidol lactate (HALDOL) injection 2 mg (2 mg Intravenous Given 06/02/22 2100)  lactated ringers bolus 1,000 mL (1,000 mLs Intravenous New Bag/Given 06/02/22 2301)         Clinical Impression: No diagnosis found.   Data Unavailable   Final Clinical Impression(s) / ED Diagnoses Final diagnoses:  None    Rx / DC Orders ED Discharge Orders     None         Tretha Sciara, MD 06/02/22 2357

## 2022-06-03 ENCOUNTER — Observation Stay (HOSPITAL_COMMUNITY): Payer: Medicare Other

## 2022-06-03 ENCOUNTER — Encounter (HOSPITAL_COMMUNITY): Payer: Self-pay | Admitting: Internal Medicine

## 2022-06-03 DIAGNOSIS — R4182 Altered mental status, unspecified: Secondary | ICD-10-CM

## 2022-06-03 DIAGNOSIS — I252 Old myocardial infarction: Secondary | ICD-10-CM | POA: Diagnosis not present

## 2022-06-03 DIAGNOSIS — D539 Nutritional anemia, unspecified: Secondary | ICD-10-CM | POA: Diagnosis not present

## 2022-06-03 DIAGNOSIS — Z87891 Personal history of nicotine dependence: Secondary | ICD-10-CM | POA: Diagnosis not present

## 2022-06-03 DIAGNOSIS — Y9301 Activity, walking, marching and hiking: Secondary | ICD-10-CM | POA: Diagnosis present

## 2022-06-03 DIAGNOSIS — I129 Hypertensive chronic kidney disease with stage 1 through stage 4 chronic kidney disease, or unspecified chronic kidney disease: Secondary | ICD-10-CM | POA: Diagnosis present

## 2022-06-03 DIAGNOSIS — F0150 Vascular dementia without behavioral disturbance: Secondary | ICD-10-CM

## 2022-06-03 DIAGNOSIS — I495 Sick sinus syndrome: Secondary | ICD-10-CM | POA: Diagnosis present

## 2022-06-03 DIAGNOSIS — N400 Enlarged prostate without lower urinary tract symptoms: Secondary | ICD-10-CM

## 2022-06-03 DIAGNOSIS — Z8249 Family history of ischemic heart disease and other diseases of the circulatory system: Secondary | ICD-10-CM | POA: Diagnosis not present

## 2022-06-03 DIAGNOSIS — S066X0A Traumatic subarachnoid hemorrhage without loss of consciousness, initial encounter: Secondary | ICD-10-CM | POA: Diagnosis not present

## 2022-06-03 DIAGNOSIS — Z85828 Personal history of other malignant neoplasm of skin: Secondary | ICD-10-CM | POA: Diagnosis not present

## 2022-06-03 DIAGNOSIS — F32A Depression, unspecified: Secondary | ICD-10-CM

## 2022-06-03 DIAGNOSIS — R296 Repeated falls: Secondary | ICD-10-CM | POA: Diagnosis present

## 2022-06-03 DIAGNOSIS — E782 Mixed hyperlipidemia: Secondary | ICD-10-CM | POA: Diagnosis present

## 2022-06-03 DIAGNOSIS — S065XAA Traumatic subdural hemorrhage with loss of consciousness status unknown, initial encounter: Secondary | ICD-10-CM | POA: Diagnosis present

## 2022-06-03 DIAGNOSIS — N1832 Chronic kidney disease, stage 3b: Secondary | ICD-10-CM | POA: Diagnosis not present

## 2022-06-03 DIAGNOSIS — Z5309 Procedure and treatment not carried out because of other contraindication: Secondary | ICD-10-CM

## 2022-06-03 DIAGNOSIS — W19XXXA Unspecified fall, initial encounter: Secondary | ICD-10-CM | POA: Diagnosis not present

## 2022-06-03 DIAGNOSIS — Z79899 Other long term (current) drug therapy: Secondary | ICD-10-CM | POA: Diagnosis not present

## 2022-06-03 DIAGNOSIS — N183 Chronic kidney disease, stage 3 unspecified: Secondary | ICD-10-CM | POA: Diagnosis not present

## 2022-06-03 DIAGNOSIS — I2510 Atherosclerotic heart disease of native coronary artery without angina pectoris: Secondary | ICD-10-CM | POA: Diagnosis not present

## 2022-06-03 DIAGNOSIS — I482 Chronic atrial fibrillation, unspecified: Secondary | ICD-10-CM

## 2022-06-03 DIAGNOSIS — E1122 Type 2 diabetes mellitus with diabetic chronic kidney disease: Secondary | ICD-10-CM | POA: Diagnosis present

## 2022-06-03 DIAGNOSIS — Y92009 Unspecified place in unspecified non-institutional (private) residence as the place of occurrence of the external cause: Secondary | ICD-10-CM | POA: Diagnosis not present

## 2022-06-03 DIAGNOSIS — K219 Gastro-esophageal reflux disease without esophagitis: Secondary | ICD-10-CM | POA: Diagnosis not present

## 2022-06-03 DIAGNOSIS — S06360A Traumatic hemorrhage of cerebrum, unspecified, without loss of consciousness, initial encounter: Secondary | ICD-10-CM | POA: Diagnosis not present

## 2022-06-03 DIAGNOSIS — S02119A Unspecified fracture of occiput, initial encounter for closed fracture: Secondary | ICD-10-CM | POA: Diagnosis not present

## 2022-06-03 DIAGNOSIS — I4891 Unspecified atrial fibrillation: Secondary | ICD-10-CM | POA: Diagnosis not present

## 2022-06-03 DIAGNOSIS — S06320A Contusion and laceration of left cerebrum without loss of consciousness, initial encounter: Secondary | ICD-10-CM | POA: Diagnosis not present

## 2022-06-03 DIAGNOSIS — Y92099 Unspecified place in other non-institutional residence as the place of occurrence of the external cause: Secondary | ICD-10-CM | POA: Diagnosis not present

## 2022-06-03 DIAGNOSIS — W010XXA Fall on same level from slipping, tripping and stumbling without subsequent striking against object, initial encounter: Secondary | ICD-10-CM | POA: Diagnosis present

## 2022-06-03 DIAGNOSIS — I4821 Permanent atrial fibrillation: Secondary | ICD-10-CM

## 2022-06-03 DIAGNOSIS — I6200 Nontraumatic subdural hemorrhage, unspecified: Secondary | ICD-10-CM | POA: Diagnosis not present

## 2022-06-03 DIAGNOSIS — S06310A Contusion and laceration of right cerebrum without loss of consciousness, initial encounter: Secondary | ICD-10-CM | POA: Diagnosis not present

## 2022-06-03 DIAGNOSIS — F0153 Vascular dementia, unspecified severity, with mood disturbance: Secondary | ICD-10-CM | POA: Diagnosis present

## 2022-06-03 DIAGNOSIS — Z950 Presence of cardiac pacemaker: Secondary | ICD-10-CM | POA: Diagnosis not present

## 2022-06-03 DIAGNOSIS — F01511 Vascular dementia, unspecified severity, with agitation: Secondary | ICD-10-CM | POA: Diagnosis not present

## 2022-06-03 DIAGNOSIS — Z7901 Long term (current) use of anticoagulants: Secondary | ICD-10-CM | POA: Diagnosis not present

## 2022-06-03 LAB — COMPREHENSIVE METABOLIC PANEL
ALT: 24 U/L (ref 0–44)
AST: 41 U/L (ref 15–41)
Albumin: 3.7 g/dL (ref 3.5–5.0)
Alkaline Phosphatase: 61 U/L (ref 38–126)
Anion gap: 9 (ref 5–15)
BUN: 34 mg/dL — ABNORMAL HIGH (ref 8–23)
CO2: 23 mmol/L (ref 22–32)
Calcium: 9.3 mg/dL (ref 8.9–10.3)
Chloride: 106 mmol/L (ref 98–111)
Creatinine, Ser: 1.64 mg/dL — ABNORMAL HIGH (ref 0.61–1.24)
GFR, Estimated: 40 mL/min — ABNORMAL LOW (ref >60)
Glucose, Bld: 143 mg/dL — ABNORMAL HIGH (ref 70–99)
Potassium: 4 mmol/L (ref 3.5–5.1)
Sodium: 138 mmol/L (ref 135–145)
Total Bilirubin: 0.7 mg/dL (ref 0.3–1.2)
Total Protein: 6.8 g/dL (ref 6.5–8.1)

## 2022-06-03 LAB — CBC
HCT: 30.6 % — ABNORMAL LOW (ref 39.0–52.0)
Hemoglobin: 9.8 g/dL — ABNORMAL LOW (ref 13.0–17.0)
MCH: 34.4 pg — ABNORMAL HIGH (ref 26.0–34.0)
MCHC: 32 g/dL (ref 30.0–36.0)
MCV: 107.4 fL — ABNORMAL HIGH (ref 80.0–100.0)
Platelets: 170 10*3/uL (ref 150–400)
RBC: 2.85 MIL/uL — ABNORMAL LOW (ref 4.22–5.81)
RDW: 12.6 % (ref 11.5–15.5)
WBC: 7.2 10*3/uL (ref 4.0–10.5)
nRBC: 0 % (ref 0.0–0.2)

## 2022-06-03 LAB — MRSA NEXT GEN BY PCR, NASAL: MRSA by PCR Next Gen: NOT DETECTED

## 2022-06-03 LAB — PHOSPHORUS: Phosphorus: 2.5 mg/dL (ref 2.5–4.6)

## 2022-06-03 LAB — MAGNESIUM: Magnesium: 2.1 mg/dL (ref 1.7–2.4)

## 2022-06-03 LAB — VITAMIN B12: Vitamin B-12: 581 pg/mL (ref 180–914)

## 2022-06-03 LAB — FOLATE: Folate: 13.4 ng/mL (ref >5.9)

## 2022-06-03 MED ORDER — ACETAMINOPHEN 325 MG PO TABS
650.0000 mg | ORAL_TABLET | Freq: Four times a day (QID) | ORAL | Status: DC | PRN
  Administered 2022-06-03 – 2022-06-07 (×3): 650 mg via ORAL
  Filled 2022-06-03 (×3): qty 2

## 2022-06-03 MED ORDER — HYDRALAZINE HCL 20 MG/ML IJ SOLN
10.0000 mg | INTRAMUSCULAR | Status: DC | PRN
  Administered 2022-06-03 – 2022-06-05 (×3): 10 mg via INTRAVENOUS
  Filled 2022-06-03 (×3): qty 1

## 2022-06-03 MED ORDER — FINASTERIDE 5 MG PO TABS
5.0000 mg | ORAL_TABLET | Freq: Every day | ORAL | Status: DC
  Administered 2022-06-03 – 2022-06-07 (×5): 5 mg via ORAL
  Filled 2022-06-03 (×5): qty 1

## 2022-06-03 MED ORDER — PANTOPRAZOLE SODIUM 40 MG IV SOLR
40.0000 mg | INTRAVENOUS | Status: DC
  Administered 2022-06-03 – 2022-06-06 (×4): 40 mg via INTRAVENOUS
  Filled 2022-06-03 (×4): qty 10

## 2022-06-03 MED ORDER — METOPROLOL TARTRATE 25 MG PO TABS
12.5000 mg | ORAL_TABLET | Freq: Two times a day (BID) | ORAL | Status: DC
  Administered 2022-06-03 – 2022-06-04 (×3): 12.5 mg via ORAL
  Filled 2022-06-03 (×3): qty 1

## 2022-06-03 MED ORDER — DONEPEZIL HCL 5 MG PO TABS
10.0000 mg | ORAL_TABLET | Freq: Every day | ORAL | Status: DC
  Administered 2022-06-04 – 2022-06-06 (×3): 10 mg via ORAL
  Filled 2022-06-03 (×4): qty 2

## 2022-06-03 MED ORDER — SERTRALINE HCL 50 MG PO TABS
50.0000 mg | ORAL_TABLET | Freq: Every morning | ORAL | Status: DC
  Administered 2022-06-03 – 2022-06-07 (×5): 50 mg via ORAL
  Filled 2022-06-03 (×5): qty 1

## 2022-06-03 MED ORDER — HALOPERIDOL LACTATE 5 MG/ML IJ SOLN: 2.0000 mg | Freq: Once | INTRAMUSCULAR | Status: DC

## 2022-06-03 MED ORDER — BRIMONIDINE TARTRATE 0.2 % OP SOLN
1.0000 [drp] | Freq: Two times a day (BID) | OPHTHALMIC | Status: DC
  Administered 2022-06-03: 1 [drp] via OPHTHALMIC
  Filled 2022-06-03 (×5): qty 5

## 2022-06-03 MED ORDER — AMIODARONE HCL IN DEXTROSE 360-4.14 MG/200ML-% IV SOLN
30.0000 mg/h | INTRAVENOUS | Status: DC
  Administered 2022-06-03: 30 mg/h via INTRAVENOUS
  Filled 2022-06-03: qty 200

## 2022-06-03 MED ORDER — LORATADINE 10 MG PO TABS
10.0000 mg | ORAL_TABLET | Freq: Every morning | ORAL | Status: DC
  Administered 2022-06-04 – 2022-06-07 (×4): 10 mg via ORAL
  Filled 2022-06-03 (×4): qty 1

## 2022-06-03 MED ORDER — AMIODARONE HCL IN DEXTROSE 360-4.14 MG/200ML-% IV SOLN
60.0000 mg/h | INTRAVENOUS | Status: AC
  Administered 2022-06-03: 60 mg/h via INTRAVENOUS
  Filled 2022-06-03: qty 200

## 2022-06-03 MED ORDER — HALOPERIDOL LACTATE 5 MG/ML IJ SOLN
2.0000 mg | Freq: Four times a day (QID) | INTRAMUSCULAR | Status: DC | PRN
  Administered 2022-06-03 – 2022-06-07 (×6): 2 mg via INTRAVENOUS
  Filled 2022-06-03 (×6): qty 1

## 2022-06-03 MED ORDER — PANTOPRAZOLE SODIUM 40 MG PO TBEC
40.0000 mg | DELAYED_RELEASE_TABLET | Freq: Every day | ORAL | Status: DC
  Administered 2022-06-05 – 2022-06-06 (×2): 40 mg via ORAL
  Filled 2022-06-03 (×4): qty 1

## 2022-06-03 NOTE — Plan of Care (Signed)
  Problem: Acute Rehab PT Goals(only PT should resolve) Goal: Pt Will Go Supine/Side To Sit Outcome: Progressing Flowsheets (Taken 06/03/2022 1209) Pt will go Supine/Side to Sit: with supervision Goal: Patient Will Transfer Sit To/From Stand Outcome: Progressing Flowsheets (Taken 06/03/2022 1209) Patient will transfer sit to/from stand: with supervision Goal: Pt Will Transfer Bed To Chair/Chair To Bed Outcome: Progressing Flowsheets (Taken 06/03/2022 1209) Pt will Transfer Bed to Chair/Chair to Bed: with supervision Goal: Pt Will Ambulate Outcome: Progressing Flowsheets (Taken 06/03/2022 1209) Pt will Ambulate:  75 feet  with cane  with min guard assist  with supervision   12:09 PM, 06/03/22 Lonell Grandchild, MPT Physical Therapist with [facility] [phone] office [phone] mobile phone

## 2022-06-03 NOTE — TOC Initial Note (Signed)
Transition of Care [facility]) - Initial/Assessment Note    Patient Details  Name: Benjamin Mason MRN: [id number] Date of Birth: July 26, 1931  Transition of Care [facility]) CM/SW Contact:    Shade Flood, LCSW Phone Number: 06/03/2022, 9:44 AM  Clinical Narrative:                  Pt admitted from [facility]. Spoke with pts daughter to assess. Per dtr, pt resides in the memory care unit. He was ambulatory with a cane prior to admission. Updated dtr on PT/OT recommendations. Her preference is for return to ALF if they can manage his care. If not, she states pt has been at [facility] in the past and they would like to go there if needed.  Spoke with RN York Pellant at [facility]. She states that it sounds like pt is at the same level of assistance as prior to admission and they should be able to take him back. Per [place], they cannot take him back on the weekend. Updated MD.  Donella Stade will follow.  Expected Discharge Plan: Assisted Living Barriers to Discharge: Continued Medical Work up   Patient Goals and CMS Choice Patient states their goals for this hospitalization and ongoing recovery are:: return to ALF CMS Medicare.gov Compare Post Acute Care list provided to:: Patient Represenative (must comment) Choice offered to / list presented to : Adult Children  Expected Discharge Plan and Services Expected Discharge Plan: Assisted Living In-house Referral: Clinical Social Work     Living arrangements for the past 2 months: [place]                                      Prior Living Arrangements/Services Living arrangements for the past 2 months: [place] Lives with:: Facility Resident Patient language and need for interpreter reviewed:: Yes Do you feel safe going back to the place where you live?: Yes      Need for Family Participation in Patient Care: No (Comment) Care giver support system in place?: Yes (comment)   Criminal  Activity/Legal Involvement Pertinent to Current Situation/Hospitalization: No - Comment as needed  Activities of Daily Living Home Assistive Devices/Equipment: Eyeglasses, Cane (specify quad or straight) ADL Screening (condition at time of admission) Patient's cognitive ability adequate to safely complete daily activities?: Yes Is the patient deaf or have difficulty hearing?: No Does the patient have difficulty seeing, even when wearing glasses/contacts?: No Does the patient have difficulty concentrating, remembering, or making decisions?: Yes Patient able to express need for assistance with ADLs?: Yes Does the patient have difficulty dressing or bathing?: Yes Independently performs ADLs?: No Does the patient have difficulty walking or climbing stairs?: Yes Weakness of Legs: Both Weakness of Arms/Hands: Both  Permission Sought/Granted Permission sought to share information with : Chartered certified accountant granted to share information with : Yes, Verbal Permission Granted     Permission granted to share info w AGENCY: ALF and SNF        Emotional Assessment       Orientation: : Oriented to Self Alcohol / Substance Use: Not Applicable Psych Involvement: No (comment)  Admission diagnosis:  Subdural hemorrhage ([place]) [I62.00] Traumatic right-sided intracerebral hemorrhage with unknown loss of consciousness status, initial encounter [facility]) [O67.67MC] Patient Active Problem List   Diagnosis Date Noted   Fall at home, initial encounter 06/03/2022   Altered mental status 06/03/2022   Hypomagnesemia 06/03/2022  Macrocytic anemia 06/03/2022   Chronic atrial fibrillation with RVR ([place]) 06/03/2022   Vascular dementia ([place]) 06/03/2022   Depression 06/03/2022   BPH (benign prostatic hyperplasia) 06/03/2022   Subdural hemorrhage ([place]) 06/02/2022   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[place]) 04/20/2022   Pacemaker 10/13/2020   Aortic  regurgitation 06/10/2020   Mitral regurgitation 06/10/2020   Closed displaced intertrochanteric fracture of right femur ([place]) 06/10/2020   H/O artificial lens replacement 06/10/2020   History of sick sinus syndrome 06/10/2020   HTN (hypertension) 06/10/2020   Sick sinus syndrome ([place]) 12/29/2019   Sinus pause 12/28/2019   Permanent atrial fibrillation ([place]) 04/03/2018   Progressive angina ([place]) 11/18/2016   CKD (chronic kidney disease), stage III ([place]) 11/18/2016   Stage 3a chronic kidney disease ([place]) 11/18/2016   GERD (gastroesophageal reflux disease) 12/04/2013   Dysphagia, unspecified(787.20) 12/04/2013   Valvular heart disease 07/31/2013   Endocarditis, valve unspecified 07/31/2013   Long term (current) use of anticoagulants 11/23/2010   Essential hypertension, benign 12/17/2009   Coronary atherosclerosis of native coronary artery 05/26/2009   Mixed hyperlipidemia 05/25/2009   PCP:  Manon Hilding, MD Pharmacy:   [place] [place] [address] [place] [address] Phone: [phone] Fax: [phone]     Social Determinants of Health ([place]) Interventions    Readmission Risk Interventions     No data to display

## 2022-06-03 NOTE — Evaluation (Signed)
Occupational Therapy Evaluation Patient Details Name: Benjamin Mason MRN: [id number] DOB: 20-Apr-1931 Today's Date: 06/03/2022   History of Present Illness Benjamin Mason is a 88 y.o. male with medical history significant of vascular dementia, depression, BPH, GERD, SSS s/p pacemaker, A-fib on Eliquis implant who presents emergency department via EMS from nursing home facility due to an unwitnessed fall.  Patient was unable to provide history, history was obtained from ED physician and daughter at bedside.  Per report, patient lost balance while walking around in the facility and fell backwards hitting the back of his head with a bruise at the injured site.  Patient only complained of minor headache.  At baseline, patient was able to ambulate with a cane and maintain some conversation.  EMS was activated and patient was sent to the ED for further evaluation and management.   Clinical Impression   Pt agreeable to OT/PT evaluation. Pt daughter and grand daughter in room and proving information. Pt came from ALF and has dementia. Per pt daughter, he was able to dress himself with assist to pick out clothing and minimal assist to don/doff clothes, he was able to complete toileting independently. Pt used tripod point cane at baseline and completed functional mobility around ALF community independently. Pt completed bed mobility with VC for use of bed rails and HHA to go from supine to sitting EOB. He was ab able to complete functional mobility around unit hallway with use of cane and min guarding. Pt required frequent VC throughout session for safety. Pt left in chair with chair alarm set and family at bedside at end of session. Discussed d/c options with daughter, discussed social worker reaching out to ALF to talk about level of assist required for pt safety and assist the ALF was able to provide. Mentioned to daughter that pt could be discharged to a SNF for rehab if ALF is unable to provide care that  is needed. Recommending pt d/c to SNF unless ALF if able to assist patient safely. Pt would benefit from continued OT services while in acute care setting to improve independence with ADL's and functional mobility.      Recommendations for follow up therapy are one component of a multi-disciplinary discharge planning process, led by the attending physician.  Recommendations may be updated based on patient status, additional functional criteria and insurance authorization.   Follow Up Recommendations  Skilled nursing-short term rehab (<3 hours/day) (rec SNF- social work to reach out to pt ALF and ask about assistance they can provide to determine if he will require rehab from SNF)    Assistance Recommended at Discharge Intermittent Supervision/Assistance  Patient can return home with the following A little help with walking and/or transfers;A little help with bathing/dressing/bathroom    Functional Status Assessment  Patient has had a recent decline in their functional status and demonstrates the ability to make significant improvements in function in a reasonable and predictable amount of time.  Equipment Recommendations  None recommended by OT           Precautions / Restrictions Precautions Precautions: Fall Restrictions Weight Bearing Restrictions: No      Mobility Bed Mobility Overal bed mobility: Needs Assistance Bed Mobility: Supine to Sit     Supine to sit: Min assist     General bed mobility comments: pt requried VC for use of bed rails and min assist with HHA to go from supine to sitting EOB    Transfers Overall transfer level: Needs assistance Equipment used:  Quad cane (tripod cane from home) Transfers: Sit to/from Stand, Bed to chair/wheelchair/BSC Sit to Stand: Min guard Stand pivot transfers: Min guard         General transfer comment: min guard fro safety      Balance Overall balance assessment: Needs assistance Sitting-balance support: Bilateral  upper extremity supported, Feet supported Sitting balance-Leahy Scale: Fair Sitting balance - Comments: pt sitting EOB with supervision fro safety with feet on floor and BL UE support Postural control: Posterior lean Standing balance support: Single extremity supported Standing balance-Leahy Scale: Good Standing balance comment: ot using cane for balance and min guarding                           ADL either performed or assessed with clinical judgement   ADL Overall ADL's : At baseline;Needs assistance/impaired Eating/Feeding: Set up   Grooming: Set up   Upper Body Bathing: Minimal assistance   Lower Body Bathing: Moderate assistance   Upper Body Dressing : Set up   Lower Body Dressing: Minimal assistance   Toilet Transfer: Min guard   Toileting- Clothing Manipulation and Hygiene: Minimal assistance   Tub/ Shower Transfer: Min guard   Functional mobility during ADLs: Min guard       Vision Baseline Vision/History: 1 Wears glasses Ability to See in Adequate Light: 0 Adequate Patient Visual Report: No change from baseline Vision Assessment?: No apparent visual deficits                Pertinent Vitals/Pain Pain Assessment Pain Assessment: No/denies pain     Hand Dominance     Extremity/Trunk Assessment Upper Extremity Assessment Upper Extremity Assessment: Overall WFL for tasks assessed   Lower Extremity Assessment Lower Extremity Assessment: Defer to PT evaluation   Cervical / Trunk Assessment Cervical / Trunk Assessment: Normal   Communication Communication Communication: No difficulties   Cognition Arousal/Alertness: Awake/alert Behavior During Therapy: WFL for tasks assessed/performed Overall Cognitive Status: History of cognitive impairments - at baseline                                 General Comments: Pt has dementia and seems to be at baseline for cognition                      Home Living Family/patient  expects to be discharged to:: Skilled nursing facility                                        Prior Functioning/Environment Prior Level of Function : Needs assist;Independent/Modified Independent       Physical Assist : ADLs (physical)   ADLs (physical): Dressing Mobility Comments: Pt uses cane for mobility and was previously able to ambulate around facility independently with cane ADLs Comments: Pt resides at facility- per pt daughter he required some assist for dressing and independently completed toileting        OT Problem List: Decreased strength;Decreased activity tolerance      OT Treatment/Interventions: Self-care/ADL training;DME and/or AE instruction;Therapeutic activities;Patient/family education    OT Goals(Current goals can be found in the care plan section) Acute Rehab OT Goals Patient Stated Goal: return to ALF if appropriate OT Goal Formulation: With patient/family Time For Goal Achievement: 06/17/22 Potential to Achieve Goals: Good  OT Frequency: Min  1X/week    Co-evaluation PT/OT/SLP Co-Evaluation/Treatment: Yes Reason for Co-Treatment: To address functional/ADL transfers   OT goals addressed during session: ADL's and self-care      AM-PAC OT "6 Clicks" Daily Activity     Outcome Measure Help from another person eating meals?: A Little (set up due to cognition) Help from another person taking care of personal grooming?: A Little (set up due to cognition) Help from another person toileting, which includes using toliet, bedpan, or urinal?: A Little Help from another person bathing (including washing, rinsing, drying)?: A Little Help from another person to put on and taking off regular upper body clothing?: None Help from another person to put on and taking off regular lower body clothing?: A Little 6 Click Score: 19   End of Session Equipment Utilized During Treatment: Gait belt Nurse Communication: Mobility status  Activity Tolerance:  Patient tolerated treatment well Patient left: in chair;with chair alarm set;with call bell/phone within reach;with family/visitor present  OT Visit Diagnosis: Muscle weakness (generalized) (M62.81);Other abnormalities of gait and mobility (R26.89)                Time: 1610-9604 OT Time Calculation (min): 30 min Charges:  OT General Charges $OT Visit: 1 Visit OT Evaluation $OT Eval Low Complexity: [place], OTR/L  06/03/2022, 9:28 AM

## 2022-06-03 NOTE — Evaluation (Signed)
Physical Therapy Evaluation Patient Details Name: Benjamin Mason MRN: [id number] DOB: 06-Oct-1930 Today's Date: 06/03/2022  History of Present Illness  Benjamin Mason is a 88 y.o. male with medical history significant of vascular dementia, depression, BPH, GERD, SSS s/p pacemaker, A-fib on Eliquis implant who presents emergency department via EMS from nursing home facility due to an unwitnessed fall.  Patient was unable to provide history, history was obtained from ED physician and daughter at bedside.  Per report, patient lost balance while walking around in the facility and fell backwards hitting the back of his head with a bruise at the injured site.  Patient only complained of minor headache.  At baseline, patient was able to ambulate with a cane and maintain some conversation.  EMS was activated and patient was sent to the ED for further evaluation and management.   Clinical Impression  Patient demonstrates slow labored movement for sitting up at bedside requiring verbal/tactile cueing for proper hand placement with fair/good carryover, unsteady on feet initially having to lean on armrest of chair while using his tripod cane, but later able to balance self using cane only ambulated into hallway with slow labored cadence without loss of balance and limited mostly due to fatigue and SpO2 dropping from 94% to 86% on room air.  Patient tolerated sitting up in chair after therapy with SpO2 increasing above 93% while on room air with family members in room.  Patient will benefit from continued skilled physical therapy in hospital and recommended venue below to increase strength, balance, endurance for safe ADLs and gait.          Recommendations for follow up therapy are one component of a multi-disciplinary discharge planning process, led by the attending physician.  Recommendations may be updated based on patient status, additional functional criteria and insurance authorization.  Follow Up  Recommendations Skilled nursing-short term rehab (<3 hours/day) Can patient physically be transported by private vehicle: Yes    Assistance Recommended at Discharge Intermittent Supervision/Assistance  Patient can return home with the following  A lot of help with walking and/or transfers;A little help with bathing/dressing/bathroom;Help with stairs or ramp for entrance;Assistance with cooking/housework    Equipment Recommendations None recommended by PT  Recommendations for Other Services       Functional Status Assessment Patient has had a recent decline in their functional status and demonstrates the ability to make significant improvements in function in a reasonable and predictable amount of time.     Precautions / Restrictions Precautions Precautions: Fall Restrictions Weight Bearing Restrictions: No      Mobility  Bed Mobility Overal bed mobility: Needs Assistance Bed Mobility: Supine to Sit     Supine to sit: Min assist     General bed mobility comments: increased time, labored movement    Transfers Overall transfer level: Needs assistance Equipment used: Straight cane (Tripod cane) Transfers: Sit to/from Stand, Bed to chair/wheelchair/BSC Sit to Stand: Min guard Stand pivot transfers: Min guard         General transfer comment: slow labored movement having to lean on armrest of chair using SPC in right hand    Ambulation/Gait Ambulation/Gait assistance: Min assist, Min guard Gait Distance (Feet): 40 Feet Assistive device: Straight cane Gait Pattern/deviations: Decreased step length - right, Decreased step length - left, Decreased stride length, Drifts right/left Gait velocity: decreased     General Gait Details: slow labored cadence with mostly step-to pattern with occasional drifting left/right without loss of balance, limited mostly due to  fatigue  Stairs            Wheelchair Mobility    Modified Rankin (Stroke Patients Only)        Balance Overall balance assessment: Needs assistance Sitting-balance support: Feet supported, No upper extremity supported Sitting balance-Leahy Scale: Fair Sitting balance - Comments: fair/good seated at EOB   Standing balance support: During functional activity, Single extremity supported Standing balance-Leahy Scale: Fair Standing balance comment: fair/good using tripod cane                             Pertinent Vitals/Pain Pain Assessment Pain Assessment: Faces Faces Pain Scale: Hurts a little bit Pain Location: back of head Pain Descriptors / Indicators: Sore Pain Intervention(s): Limited activity within patient's tolerance, Monitored during session, Repositioned    Home Living Family/patient expects to be discharged to:: Assisted living     Type of Home: Assisted living           Home Equipment: Kasandra Knudsen - single point Additional Comments: tripod cane    Prior Function Prior Level of Function : Needs assist       Physical Assist : Mobility (physical);ADLs (physical) Mobility (physical): Bed mobility;Transfers;Gait;Stairs ADLs (physical): Dressing Mobility Comments: Pt uses cane for mobility and was previously able to ambulate around facility independently with cane ADLs Comments: Pt resides at facility- per pt daughter he required some assist for dressing and independently completed toileting     Hand Dominance        Extremity/Trunk Assessment   Upper Extremity Assessment Upper Extremity Assessment: Defer to OT evaluation    Lower Extremity Assessment Lower Extremity Assessment: Generalized weakness    Cervical / Trunk Assessment Cervical / Trunk Assessment: Normal  Communication   Communication: No difficulties  Cognition Arousal/Alertness: Awake/alert Behavior During Therapy: WFL for tasks assessed/performed Overall Cognitive Status: History of cognitive impairments - at baseline                                           General Comments      Exercises     Assessment/Plan    PT Assessment Patient needs continued PT services  PT Problem List Decreased strength;Decreased activity tolerance;Decreased balance;Decreased mobility       PT Treatment Interventions DME instruction;Gait training;Stair training;Functional mobility training;Therapeutic activities;Therapeutic exercise;Patient/family education;Balance training    PT Goals (Current goals can be found in the Care Plan section)  Acute Rehab PT Goals Patient Stated Goal: return home with ALF staff to assist PT Goal Formulation: With patient/family Time For Goal Achievement: 06/17/22 Potential to Achieve Goals: Good    Frequency Min 3X/week     Co-evaluation   Reason for Co-Treatment: To address functional/ADL transfers   OT goals addressed during session: ADL's and self-care       AM-PAC PT "6 Clicks" Mobility  Outcome Measure Help needed turning from your back to your side while in a flat bed without using bedrails?: A Little Help needed moving from lying on your back to sitting on the side of a flat bed without using bedrails?: A Little Help needed moving to and from a bed to a chair (including a wheelchair)?: A Little Help needed standing up from a chair using your arms (e.g., wheelchair or bedside chair)?: A Little Help needed to walk in hospital room?: A Lot Help needed climbing 3-5  steps with a railing? : A Lot 6 Click Score: 16    End of Session   Activity Tolerance: Patient tolerated treatment well;Patient limited by fatigue Patient left: in chair;with call bell/phone within reach Nurse Communication: Mobility status PT Visit Diagnosis: Unsteadiness on feet (R26.81);Other abnormalities of gait and mobility (R26.89);Muscle weakness (generalized) (M62.81)    Time: 9622-2979 PT Time Calculation (min) (ACUTE ONLY): 30 min   Charges:   PT Evaluation $PT Eval Moderate Complexity: 1 Mod PT Treatments $Therapeutic  Activity: 23-37 mins        12:07 PM, 06/03/22 Lonell Grandchild, MPT Physical Therapist with [facility] [phone] office [phone] mobile phone

## 2022-06-03 NOTE — Progress Notes (Signed)
PROGRESS NOTE    Benjamin Mason  WLN:[id number] DOB: [birth year] DOA: 06/02/2022 PCP: Manon Hilding, MD   Brief Narrative:    Benjamin Mason is a 88 y.o. male with medical history significant of vascular dementia, depression, BPH, GERD, SSS s/p pacemaker, A-fib on Eliquis implant who presents emergency department via EMS from nursing home facility due to an unwitnessed fall.  He was noted to have subdural hemorrhage with nondisplaced fracture of the occipital calvarium which has remained stable.  His Eliquis was reversed and he now remains off anticoagulation.  He was also noted to have atrial fibrillation with RVR requiring rate control and was started on IV amiodarone.  Plan is to discontinue this at this point and start metoprolol for rate control per cardiology.  PT recommending SNF.  Assessment & Plan:   Principal Problem:   Subdural hemorrhage (HCC) Active Problems:   GERD (gastroesophageal reflux disease)   CKD (chronic kidney disease), stage III ([place])   Fall at home, initial encounter   Altered mental status   Hypomagnesemia   Macrocytic anemia   Chronic atrial fibrillation with RVR (HCC)   Vascular dementia (HCC)   Depression   BPH (benign prostatic hyperplasia)  Assessment and Plan:   Subdural hemorrhage s/p post Fall CT head without contrast showed nondisplaced  fracture of the occipital calvarium. Repeat head CT with no significant change, neurosurgery to follow-up outpatient in 3/4 weeks, discontinue Eliquis Continue fall precaution Continue PT/OT eval and treat Factor Xa recombinant was given to reverse Eliquis, currently on SCDs and plans for no further Eliquis on discharge   Altered mental status possibly secondary to above Continue management as described above  Hypertension-poorly controlled Hydralazine ordered as needed for blood pressure control to maintain systolic BP less than 740 Hold home midodrine Metoprolol twice daily per cardiology to be  started   Hypomagnesemia Repleted, continue monitoring   Chronic A-fib with RVR with pacemaker Discontinue amiodarone, unclear why this was initiated Start metoprolol 12.5 mg twice daily per cardiology recommendation No anticoagulation due to above   Macrocytic anemia Vitamin B12 and folate levels will be checked   Vascular dementia Aricept   Depression Zoloft    BPH Resume home medication   GERD Continue IV Protonix     DVT prophylaxis: SCDs Code Status: Full Family Communication: Spoke with granddaughter and daughters at bedside 10/20 Disposition Plan:  Status is: Observation The patient will require care spanning > 2 midnights and should be moved to inpatient because: Need for IV medications and close monitoring.  Consultants:  Discussed with neurosurgery Cardiology  Procedures:  None  Antimicrobials:  None   Subjective: Patient seen and evaluated today with no new acute complaints or concerns. No acute concerns or events noted overnight.  Family at bedside and patient continues to have some mild periods of agitation and remains somewhat hypertensive.  Objective: Vitals:   06/03/22 0700 06/03/22 0814 06/03/22 0900 06/03/22 1000  BP: (!) 153/110  (!) 181/83 (!) 176/75  Pulse: 90  91 81  Resp: (!) 25  (!) 22 19  Temp:  99.9 F (37.7 C)    TempSrc:  Axillary    SpO2: 95%  97% 99%  Weight:      Height:        Intake/Output Summary (Last 24 hours) at 06/03/2022 1150 Last data filed at 06/03/2022 0730 Gross per 24 hour  Intake 243.09 ml  Output --  Net 243.09 ml   Filed Weights   06/03/22 0520  Weight: 71.2 kg    Examination:  General exam: Appears calm and comfortable, pleasantly confused Respiratory system: Clear to auscultation. Respiratory effort normal. Cardiovascular system: S1 & S2 heard, RRR.  Gastrointestinal system: Abdomen is soft Central nervous system: Alert and awake Extremities: No edema Skin: No significant lesions  noted Psychiatry: Flat affect.    Data Reviewed: I have personally reviewed following labs and imaging studies  CBC: Recent Labs  Lab 06/02/22 2145 06/03/22 0336  WBC 6.0 7.2  NEUTROABS 5.2  --   HGB 9.3* 9.8*  HCT 28.9* 30.6*  MCV 107.0* 107.4*  PLT 167 250   Basic Metabolic Panel: Recent Labs  Lab 06/02/22 2145 06/03/22 0336  NA 136 138  K 4.1 4.0  CL 106 106  CO2 23 23  GLUCOSE 164* 143*  BUN 39* 34*  CREATININE 1.86* 1.64*  CALCIUM 8.7* 9.3  MG 1.6* 2.1  PHOS  --  2.5   GFR: Estimated Creatinine Clearance: 31.4 mL/min (A) (by C-G formula based on SCr of 1.64 mg/dL (H)). Liver Function Tests: Recent Labs  Lab 06/03/22 0336  AST 41  ALT 24  ALKPHOS 61  BILITOT 0.7  PROT 6.8  ALBUMIN 3.7   No results for input(s): "LIPASE", "AMYLASE" in the last 168 hours. No results for input(s): "AMMONIA" in the last 168 hours. Coagulation Profile: No results for input(s): "INR", "PROTIME" in the last 168 hours. Cardiac Enzymes: No results for input(s): "CKTOTAL", "CKMB", "CKMBINDEX", "TROPONINI" in the last 168 hours. BNP (last 3 results) No results for input(s): "PROBNP" in the last 8760 hours. HbA1C: No results for input(s): "HGBA1C" in the last 72 hours. CBG: No results for input(s): "GLUCAP" in the last 168 hours. Lipid Profile: No results for input(s): "CHOL", "HDL", "LDLCALC", "TRIG", "CHOLHDL", "LDLDIRECT" in the last 72 hours. Thyroid Function Tests: No results for input(s): "TSH", "T4TOTAL", "FREET4", "T3FREE", "THYROIDAB" in the last 72 hours. Anemia Panel: Recent Labs    06/03/22 0336  VITAMINB12 581  FOLATE 13.4   Sepsis Labs: No results for input(s): "PROCALCITON", "LATICACIDVEN" in the last 168 hours.  Recent Results (from the past 240 hour(s))  MRSA Next Gen by PCR, Nasal     Status: None   Collection Time: 06/03/22  1:46 AM   Specimen: Nasal Mucosa; Nasal Swab  Result Value Ref Range Status   MRSA by PCR Next Gen NOT DETECTED NOT  DETECTED Final    Comment: (NOTE) The GeneXpert MRSA Assay (FDA approved for NASAL specimens only), is one component of a comprehensive MRSA colonization surveillance program. It is not intended to diagnose MRSA infection nor to guide or monitor treatment for MRSA infections. Test performance is not FDA approved in patients less than 45 years old. Performed at [facility], [address] [place], [place] [address]          Radiology Studies: CT HEAD WO CONTRAST (5MM)  Result Date: 06/03/2022 CLINICAL DATA:  Follow-up subdural hemorrhage EXAM: CT HEAD WITHOUT CONTRAST TECHNIQUE: Contiguous axial images were obtained from the base of the skull through the vertex without intravenous contrast. RADIATION DOSE REDUCTION: This exam was performed according to the departmental dose-optimization program which includes automated exposure control, adjustment of the mA and/or kV according to patient size and/or use of iterative reconstruction technique. COMPARISON:  Yesterday FINDINGS: Brain: Blooming hemorrhagic contusion in the inferior left temporal lobe, hemorrhagic area measuring 11 mm. Milder right inferior temporal hemorrhagic contusion is also suspected. Unchanged trace subdural hemorrhage in the right parafalcine and tentorial convexities.  Trace subarachnoid hemorrhage at the right paramedian vertex, see 4: 43. No cerebral mass effect. No evidence of acute infarct or hydrocephalus. Chronic small vessel ischemia. Vascular: No hyperdense vessel or unexpected calcification. Skull: Left occipital skull fracture, nondepressed. Sinuses/Orbits: No acute finding IMPRESSION: 1. Expected blooming of hemorrhagic contusion in the inferior left temporal lobe, hemorrhage measuring 11 mm. Milder hemorrhagic contusion likely also present in the inferior right temporal lobe. 2. Unchanged trace subarachnoid and subdural hemorrhage. No cerebral mass effect. Electronically Signed   By: Jorje Guild M.D.   On:  06/03/2022 10:45   CT HEAD WO CONTRAST (5MM)  Result Date: 06/02/2022 CLINICAL DATA:  Trauma. EXAM: CT HEAD WITHOUT CONTRAST CT CERVICAL SPINE WITHOUT CONTRAST TECHNIQUE: Multidetector CT imaging of the head and cervical spine was performed following the standard protocol without intravenous contrast. Multiplanar CT image reconstructions of the cervical spine were also generated. RADIATION DOSE REDUCTION: This exam was performed according to the departmental dose-optimization program which includes automated exposure control, adjustment of the mA and/or kV according to patient size and/or use of iterative reconstruction technique. COMPARISON:  Head CT dated 12/21/2021. FINDINGS: CT HEAD FINDINGS Brain: Moderate age-related atrophy and chronic microvascular ischemic changes. Small right posterior parafalcine subdural hemorrhage measures 3 mm in thickness. Additional small subdural hemorrhage noted along the right tentorium measuring 6 mm in thickness. No mass effect midline shift. Vascular: No hyperdense vessel or unexpected calcification. Skull: Nondisplaced fracture of the occipital calvarium. Sinuses/Orbits: No acute finding. Other: None CT CERVICAL SPINE FINDINGS Alignment: No acute subluxation. Skull base and vertebrae: No acute fracture. Soft tissues and spinal canal: No prevertebral fluid or swelling. No visible canal hematoma. Disc levels:  No acute findings.  Multilevel degenerative changes. Upper chest: Negative. Other: Bilateral carotid bulb calcified plaques. IMPRESSION: 1. Nondisplaced fracture of the occipital calvarium. 2. Small right posterior parafalcine and right supratentorial subdural hemorrhage. No mass effect or midline shift. 3. Moderate age-related atrophy and chronic microvascular ischemic changes. 4. No acute/traumatic cervical spine pathology. These results were called by telephone at the time of interpretation on 06/02/2022 at 7:10 pm to provider [facility] , who verbally  acknowledged these results. Electronically Signed   By: Anner Crete M.D.   On: 06/02/2022 19:16   CT CERVICAL SPINE WO CONTRAST  Result Date: 06/02/2022 CLINICAL DATA:  Trauma. EXAM: CT HEAD WITHOUT CONTRAST CT CERVICAL SPINE WITHOUT CONTRAST TECHNIQUE: Multidetector CT imaging of the head and cervical spine was performed following the standard protocol without intravenous contrast. Multiplanar CT image reconstructions of the cervical spine were also generated. RADIATION DOSE REDUCTION: This exam was performed according to the departmental dose-optimization program which includes automated exposure control, adjustment of the mA and/or kV according to patient size and/or use of iterative reconstruction technique. COMPARISON:  Head CT dated 12/21/2021. FINDINGS: CT HEAD FINDINGS Brain: Moderate age-related atrophy and chronic microvascular ischemic changes. Small right posterior parafalcine subdural hemorrhage measures 3 mm in thickness. Additional small subdural hemorrhage noted along the right tentorium measuring 6 mm in thickness. No mass effect midline shift. Vascular: No hyperdense vessel or unexpected calcification. Skull: Nondisplaced fracture of the occipital calvarium. Sinuses/Orbits: No acute finding. Other: None CT CERVICAL SPINE FINDINGS Alignment: No acute subluxation. Skull base and vertebrae: No acute fracture. Soft tissues and spinal canal: No prevertebral fluid or swelling. No visible canal hematoma. Disc levels:  No acute findings.  Multilevel degenerative changes. Upper chest: Negative. Other: Bilateral carotid bulb calcified plaques. IMPRESSION: 1. Nondisplaced fracture of the occipital calvarium. 2. Small  right posterior parafalcine and right supratentorial subdural hemorrhage. No mass effect or midline shift. 3. Moderate age-related atrophy and chronic microvascular ischemic changes. 4. No acute/traumatic cervical spine pathology. These results were called by telephone at the time of  interpretation on 06/02/2022 at 7:10 pm to provider [facility] , who verbally acknowledged these results. Electronically Signed   By: Anner Crete M.D.   On: 06/02/2022 19:16        Scheduled Meds:  brimonidine  1 drop Both Eyes BID   Chlorhexidine Gluconate Cloth  6 each Topical Daily   donepezil  10 mg Oral QHS   finasteride  5 mg Oral Daily   haloperidol lactate  2 mg Intravenous Once   [START ON 06/04/2022] loratadine  10 mg Oral q AM   pantoprazole  40 mg Oral Daily   pantoprazole (PROTONIX) IV  40 mg Intravenous Q24H   sertraline  50 mg Oral q morning   Continuous Infusions:  amiodarone 30 mg/hr (06/03/22 0733)     LOS: 0 days    Time spent: 35 minutes    Pocahontas Cohenour Darleen Crocker, DO [facility]  If 7PM-7AM, please contact night-coverage [email or web address] 06/03/2022, 11:50 AM

## 2022-06-03 NOTE — Progress Notes (Signed)
Repeat head CT reviewed on behalf of Dr. Reatha Armour.  There is minor expected evolution of small inferior left temporal contusion.  There is minimal falcine and tentorial subdural hematoma.  No mass effect, midline shift, or hydrocephalus is noted.  Per report, patient appears to be at baseline with dementia.  At this point would recommend following clinically without scheduled inpatient follow-up CT scan.  Would recommend holding Eliquis for now.  Patient can follow-up in outpatient neurosurgery clinic with Dr. Reatha Armour in 3-4 weeks.  Consuella Lose, MD [facility]

## 2022-06-03 NOTE — Consult Note (Signed)
Cardiology Consultation   Patient ID: Benjamin Mason MRN: [id number]; DOB: 1930-12-26  Admit date: 06/02/2022 Date of Consult: 06/03/2022  PCP:  Manon Hilding, MD   [place] Providers Cardiologist:  Rozann Lesches, MD  EP: Dr. Caryl Comes  Patient Profile:   Benjamin Mason is a 88 y.o. male with a hx of CAD (s/p BMS to RCA and LAD in 1995), permanent atrial fibrillation, SSS (s/p St. Jude PPM placement in 12/2019), orthostatic hypotension, HTN, HLD, Stage 3 CKD, dementia and IBS who is being seen 06/03/2022 for the evaluation of atrial fibrillation with RVR in the setting of a subdural hemorrhage at the request of Dr. Manuella Ghazi.  History of Present Illness:   Benjamin Mason was last examined by Dr. Domenic Polite in 03/2022 and was residing at [facility] at that time. He denied any recent falls and was continued on Eliquis 2.'5mg'$  BID and Midodrine '10mg'$  in AM/5 mg in PM. Has not required the use of AV nodal blocking agents.   He presented to [facility] ED on 06/02/2022 after suffering a fall at SNF and hitting his head. Initial labs showed WBC 6.0, Hgb 9.3, platelets 167, Na+ 136, K+ 4.1 and creatinine 1.86 (baseline 1.9 - 2.1). Mg 1.6. EKG showed atrial fibrillation with RVR, HR 159 with PVC's. CT Head showed a nondisplaced fracture of the occipital calvarium and a small right posterior parafalcine and right supratentorial subdural hemorrhage with no mass effect or midline shift. Neurosurgery was consulted and recommended reversal of Eliquis with Andexxa. They did recommend serial Head CT's and repeat this morning showed expected blooming of the hemorrhagic contusion with hemorrhage measuring 11 mm and unchanged trace subarachnoid and subdural hemorrhage.   For his atrial fibrillation, he was started on IV Amiodarone over other medications for unclear reasons. Did not receive any IV AV nodal blocking agents by review of the MAR and BP has been elevated at 140/90 - 174/75.   In  talking with the patient and his family today, most history is provided by his daughter and granddaughter at the bedside. They report he resides at a memory care unit given worsening vascular dementia and his cognitive status varies by the day. He has been less alert this admission. They report this was his second major fall in the past few months. He denies any specific pain at this time. Main complaint at this time is feeling cold and wanting lunch. Says he does sometimes feel palpitations but denies any symptoms at this time.   Past Medical History:  Diagnosis Date   Anxiety    Aortic regurgitation    Mild to moderate   CKD (chronic kidney disease) stage 3, GFR 30-59 ml/min (HCC)    Coronary atherosclerosis of native coronary artery    BMS to RCA and LAD 1995   Essential hypertension    Frequent PVCs    IBS (irritable bowel syndrome)    Lactose intolerance    Mitral regurgitation    Moderate   Mixed hyperlipidemia    Permanent atrial fibrillation [facility])    Sick sinus syndrome [facility])    St. Jude single-chamber pacemaker May 2021 - Dr. Caryl Comes   Skin cancer     Past Surgical History:  Procedure Laterality Date   BALLOON DILATION  08/03/2012   Procedure: BALLOON DILATION;  Surgeon: Rogene Houston, MD;  Location[facility];  Service: Endoscopy;  Laterality: N/A;   COLONOSCOPY WITH ESOPHAGOGASTRODUODENOSCOPY (EGD)  08/03/2012   Procedure: COLONOSCOPY WITH ESOPHAGOGASTRODUODENOSCOPY (EGD);  Surgeon: Rogene Houston, MD;  Location: [facility];  Service: Endoscopy;  Laterality: N/A;  215   LEFT HEART CATH AND CORONARY ANGIOGRAPHY N/A 11/18/2016   Procedure: Left Heart Cath and Coronary Angiography;  Surgeon: Peter M Martinique, MD;  Location: [place] CV LAB;  Service: Cardiovascular;  Laterality: N/A;   LEFT INGUINAL HERNIA     MALONEY DILATION  08/03/2012   Procedure: MALONEY DILATION;  Surgeon: Rogene Houston, MD;  Location: [facility];  Service: Endoscopy;  Laterality: N/A;    PACEMAKER IMPLANT N/A 12/30/2019   Procedure: PACEMAKER IMPLANT;  Surgeon: Deboraha Sprang, MD;  Location: [place] [facility];  Service: Cardiovascular;  Laterality: N/A;   PENILE PROSTHESIS IMPLANT     SAVORY DILATION  08/03/2012   Procedure: SAVORY DILATION;  Surgeon: Rogene Houston, MD;  [facility];  Service: Endoscopy;  Laterality: N/A;     Home Medications:  Prior to Admission medications   Medication Sig Start Date End Date Taking? Authorizing Provider  acetaminophen (TYLENOL) 650 MG CR tablet Take 650 mg by mouth 2 (two) times daily. 03/03/22  Yes [provider]  brimonidine (ALPHAGAN) 0.2 % ophthalmic solution Place 1 drop into both eyes 2 (two) times daily. 01/25/22  Yes [provider]  Cholecalciferol (VITAMIN D3) 25 MCG (1000 UT) CAPS Take 1,000 Units by mouth daily. 04/28/22  Yes [provider]  Cyanocobalamin (B-12) 1000 MCG TABS Take 1 tablet (1,000 mcg total) by mouth daily. 04/20/22  Yes Rondel Jumbo, PA-C  donepezil (ARICEPT) 10 MG tablet Take 10 mg by mouth at bedtime. 09/07/21  Yes [provider]  ELIQUIS 2.5 MG TABS tablet TAKE ONE TABLET BY MOUTH TWICE DAILY Patient taking differently: Take 2.5 mg by mouth 2 (two) times daily. 02/14/22  Yes Satira Sark, MD  finasteride (PROSCAR) 5 MG tablet Take 5 mg by mouth daily.     Yes [provider]  loratadine (CLARITIN) 10 MG tablet Take 10 mg by mouth in the morning.   Yes [provider]  midodrine (PROAMATINE) 5 MG tablet Take 2 tablets in the morning and one tablet in the evening. 01/26/22  Yes Satira Sark, MD  Nutritional Supplements (ENSURE HIGH PROTEIN) LIQD Take 237 mLs by mouth 3 (three) times daily after meals. 03/25/22  Yes [provider]  pantoprazole (PROTONIX) 40 MG tablet Take 40 mg by mouth daily.  01/17/19  Yes [provider]  sertraline (ZOLOFT) 50 MG tablet Take 50 mg by mouth every morning. 05/09/22  Yes [provider]  ciprofloxacin (CIPRO) 500 MG tablet Take 500 mg by mouth 2 (two) times daily. Patient not taking: Reported on 06/02/2022    [provider]  nitroGLYCERIN (NITROSTAT) 0.4 MG SL tablet Place 1 tablet (0.4 mg total) under the tongue every 5 (five) minutes x 3 doses as needed for chest pain (if no relief after 3rd dose, proceed to the ED for an evaluation or call 911). PLACE ONE TABLET UNDER THE TONGUE EVERY 5 MINUTES AS NEEDED FOR CHEST PAIN Patient not taking: Reported on 06/02/2022 03/18/20   Satira Sark, MD    Inpatient Medications: Scheduled Meds:  brimonidine  1 drop Both Eyes BID   Chlorhexidine Gluconate Cloth  6 each Topical Daily   donepezil  10 mg Oral QHS   finasteride  5 mg Oral Daily   haloperidol lactate  2 mg Intravenous Once   [START ON 06/04/2022] loratadine  10  mg Oral q AM   pantoprazole  40 mg Oral Daily   pantoprazole (PROTONIX) IV  40 mg Intravenous Q24H   sertraline  50 mg Oral q morning   Continuous Infusions:  amiodarone 30 mg/hr (06/03/22 0733)   PRN Meds:   Allergies:   No Known Allergies  Social History:   Social History   Socioeconomic History   Marital status: Married    Spouse name: Not on file   Number of children: 7   Years of education: Not on file   Highest education level: Not on file  Occupational History   Occupation: RETIRED    Employer: OTHER    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 [employer]  Tobacco Use   Smoking status: Former    Packs/day: 2.00    Years: 25.00    Total pack years: 50.00    Types: Cigarettes    Quit date: 08/15/1962    Years since quitting: 59.8   Smokeless tobacco: Never  Vaping Use   Vaping Use: Never used  Substance and Sexual Activity   Alcohol use: No    Alcohol/week: 0.0 standard drinks of alcohol   Drug use: No   Sexual activity: Not on file  Other Topics Concern   Not on file  Social History Narrative   Right handed   Caffeine 1 cup daily   Live in memory care at  [place] Strain: Not on file  Food Insecurity: No Food Insecurity (06/03/2022)   Hunger Vital Sign    Worried About Running Out of Food in the Last Year: Never true    Ran Out of Food in the Last Year: Never true  Transportation Needs: No Transportation Needs (06/03/2022)   PRAPARE - Hydrologist (Medical): No    Lack of Transportation (Non-Medical): No  Physical Activity: Not on file  Stress: Not on file  Social Connections: Not on file  Intimate Partner Violence: Not At Risk (06/03/2022)   Humiliation, Afraid, Rape, and Kick questionnaire    Fear of Current or Ex-Partner: No    Emotionally Abused: No    Physically Abused: No    Sexually Abused: No    Family History:    Family History  Problem Relation Age of Onset   Heart attack Father        in his early 68's     ROS:  Please see the history of present illness.   All other ROS reviewed and negative.     Physical Exam/Data:   Vitals:   06/03/22 0700 06/03/22 0814 06/03/22 0900 06/03/22 1000  BP: (!) 153/110  (!) 181/83 (!) 176/75  Pulse: 90  91 81  Resp: (!) 25  (!) 22 19  Temp:  99.9 F (37.7 C)    TempSrc:  Axillary    SpO2: 95%  97% 99%  Weight:      Height:        Intake/Output Summary (Last 24 hours) at 06/03/2022 1158 Last data filed at 06/03/2022 0730 Gross per 24 hour  Intake 243.09 ml  Output --  Net 243.09 ml      06/03/2022    5:20 AM 04/19/2022    1:16 PM 04/06/2022    3:56 PM  Last 3 Weights  Weight (lbs) 156 lb 15.5 oz 157 lb 157 lb  Weight (kg) 71.2 kg 71.215 kg 71.215 kg     Body mass index is 19.62 kg/m.  General:  Pleasant, thin elderly male currently in no acute distress.  HEENT: normal Neck: no JVD Vascular: No carotid bruits; Distal pulses 2+ bilaterally Cardiac:  normal S1, S2; Irregularly irregular; 2/6 SEM along RUSB.  Lungs:  clear to auscultation bilaterally, no wheezing, rhonchi or rales   Abd: soft, nontender, no hepatomegaly  Ext: no pitting edema Musculoskeletal:  No deformities, BUE and BLE strength normal and equal Skin: warm and dry  Psych: A&Ox1  EKG:  The EKG was personally reviewed and demonstrates: Atrial fibrillation with RVR, HR 159 with PVC's.   Telemetry:  Telemetry was personally reviewed and demonstrates: Atrial fibrillation, HR initially in the 140's to 150's, improved into the 80's to 90's currently.   Relevant CV Studies:  Echocardiogram: 06/2019 IMPRESSIONS     1. Left ventricular ejection fraction, by visual estimation, is 55 to  60%. The left ventricle has normal function. There is no left ventricular  hypertrophy.   2. Left ventricular diastolic parameters are indeterminate.   3. Global right ventricle has normal systolic function.The right  ventricular size is mildly enlarged. No increase in right ventricular wall  thickness.   4. Left atrial size was moderately dilated.   5. Right atrial size was moderately dilated.   6. Mild mitral annular calcification.   7. Mild to moderate aortic valve annular calcification.   8. The mitral valve is degenerative. Mild mitral valve regurgitation.   9. The tricuspid valve is grossly normal. Tricuspid valve regurgitation  is mild.  10. The aortic valve is tricuspid. Aortic valve regurgitation is mild.  Mild aortic valve sclerosis without stenosis.  11. The pulmonic valve was grossly normal. Pulmonic valve regurgitation is  mild.  12. Normal pulmonary artery systolic pressure.  13. The tricuspid regurgitant velocity is 2.23 m/s, and with an assumed  right atrial pressure of 8 mmHg, the estimated right ventricular systolic  pressure is normal at 27.9 mmHg.  14. The inferior vena cava is normal in size with <50% respiratory  variability, suggesting right atrial pressure of 8 mmHg.   NST: 07/2019 There was no ST segment deviation noted during stress. Findings consistent with prior inferior/inferoseptal  myocardial infarction. There is no current ischemia. This is an intermediate risk study. Risk based on decreased LVEF, there is no current ischemia. Recommend correlating LVEF with echo. The left ventricular ejection fraction is mildly decreased (45-54%).   Laboratory Data:  High Sensitivity Troponin:  No results for input(s): "TROPONINIHS" in the last 720 hours.   Chemistry Recent Labs  Lab 06/02/22 2145 06/03/22 0336  NA 136 138  K 4.1 4.0  CL 106 106  CO2 23 23  GLUCOSE 164* 143*  BUN 39* 34*  CREATININE 1.86* 1.64*  CALCIUM 8.7* 9.3  MG 1.6* 2.1  GFRNONAA 34* 40*  ANIONGAP 7 9    Recent Labs  Lab 06/03/22 0336  PROT 6.8  ALBUMIN 3.7  AST 41  ALT 24  ALKPHOS 61  BILITOT 0.7   Lipids No results for input(s): "CHOL", "TRIG", "HDL", "LABVLDL", "LDLCALC", "CHOLHDL" in the last 168 hours.  Hematology Recent Labs  Lab 06/02/22 2145 06/03/22 0336  WBC 6.0 7.2  RBC 2.70* 2.85*  HGB 9.3* 9.8*  HCT 28.9* 30.6*  MCV 107.0* 107.4*  MCH 34.4* 34.4*  MCHC 32.2 32.0  RDW 12.6 12.6  PLT 167 170   Thyroid No results for input(s): "TSH", "FREET4" in the last 168 hours.  BNPNo results for input(s): "BNP", "PROBNP" in the last 168 hours.  DDimer No results  for input(s): "DDIMER" in the last 168 hours.   Radiology/Studies:  CT HEAD WO CONTRAST (5MM)  Result Date: 06/03/2022 CLINICAL DATA:  Follow-up subdural hemorrhage EXAM: CT HEAD WITHOUT CONTRAST TECHNIQUE: Contiguous axial images were obtained from the base of the skull through the vertex without intravenous contrast. RADIATION DOSE REDUCTION: This exam was performed according to the departmental dose-optimization program which includes automated exposure control, adjustment of the mA and/or kV according to patient size and/or use of iterative reconstruction technique. COMPARISON:  Yesterday FINDINGS: Brain: Blooming hemorrhagic contusion in the inferior left temporal lobe, hemorrhagic area measuring 11 mm. Milder right  inferior temporal hemorrhagic contusion is also suspected. Unchanged trace subdural hemorrhage in the right parafalcine and tentorial convexities. Trace subarachnoid hemorrhage at the right paramedian vertex, see 4: 43. No cerebral mass effect. No evidence of acute infarct or hydrocephalus. Chronic small vessel ischemia. Vascular: No hyperdense vessel or unexpected calcification. Skull: Left occipital skull fracture, nondepressed. Sinuses/Orbits: No acute finding IMPRESSION: 1. Expected blooming of hemorrhagic contusion in the inferior left temporal lobe, hemorrhage measuring 11 mm. Milder hemorrhagic contusion likely also present in the inferior right temporal lobe. 2. Unchanged trace subarachnoid and subdural hemorrhage. No cerebral mass effect. Electronically Signed   By: Jorje Guild M.D.   On: 06/03/2022 10:45   CT HEAD WO CONTRAST (5MM)  Result Date: 06/02/2022 CLINICAL DATA:  Trauma. EXAM: CT HEAD WITHOUT CONTRAST CT CERVICAL SPINE WITHOUT CONTRAST TECHNIQUE: Multidetector CT imaging of the head and cervical spine was performed following the standard protocol without intravenous contrast. Multiplanar CT image reconstructions of the cervical spine were also generated. RADIATION DOSE REDUCTION: This exam was performed according to the departmental dose-optimization program which includes automated exposure control, adjustment of the mA and/or kV according to patient size and/or use of iterative reconstruction technique. COMPARISON:  Head CT dated 12/21/2021. FINDINGS: CT HEAD FINDINGS Brain: Moderate age-related atrophy and chronic microvascular ischemic changes. Small right posterior parafalcine subdural hemorrhage measures 3 mm in thickness. Additional small subdural hemorrhage noted along the right tentorium measuring 6 mm in thickness. No mass effect midline shift. Vascular: No hyperdense vessel or unexpected calcification. Skull: Nondisplaced fracture of the occipital calvarium. Sinuses/Orbits: No  acute finding. Other: None CT CERVICAL SPINE FINDINGS Alignment: No acute subluxation. Skull base and vertebrae: No acute fracture. Soft tissues and spinal canal: No prevertebral fluid or swelling. No visible canal hematoma. Disc levels:  No acute findings.  Multilevel degenerative changes. Upper chest: Negative. Other: Bilateral carotid bulb calcified plaques. IMPRESSION: 1. Nondisplaced fracture of the occipital calvarium. 2. Small right posterior parafalcine and right supratentorial subdural hemorrhage. No mass effect or midline shift. 3. Moderate age-related atrophy and chronic microvascular ischemic changes. 4. No acute/traumatic cervical spine pathology. These results were called by telephone at the time of interpretation on 06/02/2022 at 7:10 pm to provider [employer] , who verbally acknowledged these results. Electronically Signed   By: Anner Crete M.D.   On: 06/02/2022 19:16   CT CERVICAL SPINE WO CONTRAST  Result Date: 06/02/2022 CLINICAL DATA:  Trauma. EXAM: CT HEAD WITHOUT CONTRAST CT CERVICAL SPINE WITHOUT CONTRAST TECHNIQUE: Multidetector CT imaging of the head and cervical spine was performed following the standard protocol without intravenous contrast. Multiplanar CT image reconstructions of the cervical spine were also generated. RADIATION DOSE REDUCTION: This exam was performed according to the departmental dose-optimization program which includes automated exposure control, adjustment of the mA and/or kV according to patient size and/or use of iterative reconstruction technique. COMPARISON:  Head CT  dated 12/21/2021. FINDINGS: CT HEAD FINDINGS Brain: Moderate age-related atrophy and chronic microvascular ischemic changes. Small right posterior parafalcine subdural hemorrhage measures 3 mm in thickness. Additional small subdural hemorrhage noted along the right tentorium measuring 6 mm in thickness. No mass effect midline shift. Vascular: No hyperdense vessel or unexpected  calcification. Skull: Nondisplaced fracture of the occipital calvarium. Sinuses/Orbits: No acute finding. Other: None CT CERVICAL SPINE FINDINGS Alignment: No acute subluxation. Skull base and vertebrae: No acute fracture. Soft tissues and spinal canal: No prevertebral fluid or swelling. No visible canal hematoma. Disc levels:  No acute findings.  Multilevel degenerative changes. Upper chest: Negative. Other: Bilateral carotid bulb calcified plaques. IMPRESSION: 1. Nondisplaced fracture of the occipital calvarium. 2. Small right posterior parafalcine and right supratentorial subdural hemorrhage. No mass effect or midline shift. 3. Moderate age-related atrophy and chronic microvascular ischemic changes. 4. No acute/traumatic cervical spine pathology. These results were called by telephone at the time of interpretation on 06/02/2022 at 7:10 pm to provider [facility] , who verbally acknowledged these results. Electronically Signed   By: Anner Crete M.D.   On: 06/02/2022 19:16     Assessment and Plan:   1. Atrial Fibrillation with RVR - He has permanent atrial fibrillation but was in atrial fibrillation with RVR at the time of admission which was likely secondary to his acute subdural hemorrhage.  - He was started on IV Amiodarone and rates have improved into the 80's to 90's. Given that he is not going to be on anticoagulation going forward, would not plan to continue this long-term. Will ask the hospitalist team to verify with Neurosurgery if he has specific BP goals. Would prefer to stop Amiodarone and switch to Lopressor if BP allows (history of orthostatic hypotension). He previously did not require the use of AV nodal blocking agents and might not need these long-term. - Reviewed risks and benefits of anticoagulation with the patient's family and Eliquis has been discontinued and they wish to refer him to remain off of this going forward given that he is now having more frequent falls.  2.  CAD - He is s/p BMS to RCA and LAD in 1995. Stress test in 07/2019 showed no ischemia. No reported anginal symptoms and would continue with medical therapy alone given his dementia.  3. SSS - He does have a St. Jude PPM placement in 12/2019. Followed by Dr. Caryl Comes as an outpatient.   4. Stage 3 CKD - Creatinine at 1.86 on admission which is close to his baseline of 1.9 - 2.1.  5. Subdural Hemorrhage - CT Head on admission showed a nondisplaced fracture of the occipital calvarium and a small right posterior parafalcine and right supratentorial subdural hemorrhage with no mass effect or midline shift. Neurosurgery following remotely. No plans for intervention at this time. Eliquis has been held.    Risk Assessment/Risk Scores:      CHA2DS2-VASc Score = 4   This indicates a 4.8% annual risk of stroke. The patient's score is based upon: CHF History: 0 HTN History: 1 Diabetes History: 0 Stroke History: 0 Vascular Disease History: 1 Age Score: 2 Gender Score: 0   For questions or updates, please contact [place] Please consult [email or web address] for contact info under    Signed, Erma Heritage, PA-C  06/03/2022 11:58 AM

## 2022-06-03 NOTE — Evaluation (Signed)
Clinical/Bedside Swallow Evaluation Patient Details  Name: Benjamin Mason MRN: [id number] Date of Birth: Jan 06, 1931  Today's Date: 06/03/2022 Time: SLP Start Time (ACUTE ONLY): 69 SLP Stop Time (ACUTE ONLY): 1015 SLP Time Calculation (min) (ACUTE ONLY): 30 min  Past Medical History:  Past Medical History:  Diagnosis Date   Anxiety    Aortic regurgitation    Mild to moderate   CKD (chronic kidney disease) stage 3, GFR 30-59 ml/min (HCC)    Coronary atherosclerosis of native coronary artery    BMS to RCA and LAD 1995   Essential hypertension    Frequent PVCs    IBS (irritable bowel syndrome)    Lactose intolerance    Mitral regurgitation    Moderate   Mixed hyperlipidemia    Permanent atrial fibrillation (HCC)    Sick sinus syndrome [facility])    St. Jude single-chamber pacemaker May 2021 - Dr. Caryl Comes   Skin cancer    Past Surgical History:  Past Surgical History:  Procedure Laterality Date   BALLOON DILATION  08/03/2012   Procedure: BALLOON DILATION;  Surgeon: Rogene Houston, MD;  [facility];  Service: Endoscopy;  Laterality: N/A;   COLONOSCOPY WITH ESOPHAGOGASTRODUODENOSCOPY (EGD)  08/03/2012   Procedure: COLONOSCOPY WITH ESOPHAGOGASTRODUODENOSCOPY (EGD);  Surgeon: Rogene Houston, MD;  [facility];  Service: Endoscopy;  Laterality: N/A;  215   LEFT HEART CATH AND CORONARY ANGIOGRAPHY N/A 11/18/2016   Procedure: Left Heart Cath and Coronary Angiography;  Surgeon: Peter M Martinique, MD;  Location: [place] [facility] LAB;  Service: Cardiovascular;  Laterality: N/A;   LEFT INGUINAL HERNIA     MALONEY DILATION  08/03/2012   Procedure: MALONEY DILATION;  Surgeon: Rogene Houston, MD;  [facility];  Service: Endoscopy;  Laterality: N/A;   PACEMAKER IMPLANT N/A 12/30/2019   Procedure: PACEMAKER IMPLANT;  Surgeon: Deboraha Sprang, MD;  Location: [place] CV LAB;  Service: Cardiovascular;  Laterality: N/A;   PENILE PROSTHESIS IMPLANT     SAVORY  DILATION  08/03/2012   Procedure: SAVORY DILATION;  Surgeon: Rogene Houston, MD;  [facility];  Service: Endoscopy;  Laterality: N/A;   HPI:  Benjamin Mason is a 88 y.o. male with medical history significant of vascular dementia, depression, BPH, GERD, SSS s/p pacemaker, A-fib on Eliquis implant who presents emergency department via EMS from nursing home facility due to an unwitnessed fall.  Patient was unable to provide history, history was obtained from ED physician and daughter at bedside.  Per report, patient lost balance while walking around in the facility and fell backwards hitting the back of his head with a bruise at the injured site.  Patient only complained of minor headache.  At baseline, patient was able to ambulate with a cane and maintain some conversation.  EMS was activated and patient was sent to the ED for further evaluation and management. BSE requested    Assessment / Plan / Recommendation  Clinical Impression  Clinical swallowing evaluation completed while Pt was sitting upright in bed; SLP provided oral care. Family at bedside report hx of esophageal dysphagia but that Pt normally consumes a soft diet with thin liquids at the memory care facility where he is a resident. Pt initially demonstrated some coughing after the first few sips of thin liquids via straw, however, Pt then consumed thin via cup without incident. SLP provided puree and regular without difficulty. Pt was provided straw again and instructed to take small sips  and note no coughing or throat clearing after trials with controlled amount via straw. Recommend initiate diet of D3/mech soft and thin liquids. Meds are ok whole with liquids. Recommend strategies re: encourage Pt to self feed, recommend avoid straws as Pt is somewhat impulsive with consecutive sips, ensure Pt is sitting upright and alert for all PO. ST will f/u X1 to ensure diet tolerance. Above to family and RN, Thank you, SLP Visit  Diagnosis: Dysphagia, unspecified (R13.10)    Aspiration Risk  Mild aspiration risk    Diet Recommendation Dysphagia 3 (Mech soft);Thin liquid   Liquid Administration via: No straw;Cup Medication Administration: Whole meds with liquid Supervision: Patient able to self feed;Intermittent supervision to cue for compensatory strategies Compensations: Slow rate;Small sips/bites    Other  Recommendations Oral Care Recommendations: Oral care BID    Recommendations for follow up therapy are one component of a multi-disciplinary discharge planning process, led by the attending physician.  Recommendations may be updated based on patient status, additional functional criteria and insurance authorization.  Follow up Recommendations Skilled nursing-short term rehab (<3 hours/day)            Frequency and Duration min 1 x/week  1 week       Prognosis Prognosis for Safe Diet Advancement: Good Barriers to Reach Goals: Cognitive deficits      Swallow Study   General HPI: Benjamin Mason is a 88 y.o. male with medical history significant of vascular dementia, depression, BPH, GERD, SSS s/p pacemaker, A-fib on Eliquis implant who presents emergency department via EMS from nursing home facility due to an unwitnessed fall.  Patient was unable to provide history, history was obtained from ED physician and daughter at bedside.  Per report, patient lost balance while walking around in the facility and fell backwards hitting the back of his head with a bruise at the injured site.  Patient only complained of minor headache.  At baseline, patient was able to ambulate with a cane and maintain some conversation.  EMS was activated and patient was sent to the ED for further evaluation and management. BSE requested Type of Study: Bedside Swallow Evaluation Previous Swallow Assessment: none in chart Diet Prior to this Study: NPO Temperature Spikes Noted: No Respiratory Status: Room air History of Recent  Intubation: No Behavior/Cognition: Alert;Cooperative;Pleasant mood;Confused Oral Cavity Assessment: Within Functional Limits Oral Care Completed by SLP: Yes Oral Cavity - Dentition: Adequate natural dentition Vision: Functional for self-feeding Self-Feeding Abilities: Able to feed self Patient Positioning: Upright in bed Baseline Vocal Quality: Normal Volitional Cough: Strong Volitional Swallow: Able to elicit    Oral/Motor/Sensory Function Overall Oral Motor/Sensory Function: Within functional limits   Ice Chips Ice chips: Within functional limits   Thin Liquid Thin Liquid: Impaired Presentation: Straw;Cup Pharyngeal  Phase Impairments: Cough - Delayed;Cough - Immediate    Nectar Thick Nectar Thick Liquid: Not tested   Honey Thick Honey Thick Liquid: Not tested   Puree Puree: Within functional limits Presentation: Self Fed;Spoon   Solid     Solid: Within functional limits     Annalei Friesz H. Roddie Mc, [place] Speech Language Pathologist  Wende Bushy 06/03/2022,10:16 AM

## 2022-06-03 NOTE — Plan of Care (Signed)
  Problem: Acute Rehab OT Goals (only OT should resolve) Goal: Pt. Will Perform Upper Body Bathing Flowsheets (Taken 06/03/2022 0936) Pt Will Perform Upper Body Bathing: with set-up Goal: Pt. Will Perform Lower Body Dressing Flowsheets (Taken 06/03/2022 0936) Pt Will Perform Lower Body Dressing: with min assist Goal: Pt. Will Transfer To Toilet Flowsheets (Taken 06/03/2022 [phone]) Pt Will Transfer to Toilet: with supervision    Arvil Persons, OTR/L

## 2022-06-04 ENCOUNTER — Inpatient Hospital Stay (HOSPITAL_COMMUNITY): Payer: Medicare Other

## 2022-06-04 ENCOUNTER — Encounter (HOSPITAL_COMMUNITY): Payer: Self-pay | Admitting: Internal Medicine

## 2022-06-04 DIAGNOSIS — I6200 Nontraumatic subdural hemorrhage, unspecified: Secondary | ICD-10-CM | POA: Diagnosis not present

## 2022-06-04 LAB — BASIC METABOLIC PANEL
Anion gap: 10 (ref 5–15)
BUN: 28 mg/dL — ABNORMAL HIGH (ref 8–23)
CO2: 23 mmol/L (ref 22–32)
Calcium: 9.4 mg/dL (ref 8.9–10.3)
Chloride: 103 mmol/L (ref 98–111)
Creatinine, Ser: 1.53 mg/dL — ABNORMAL HIGH (ref 0.61–1.24)
GFR, Estimated: 43 mL/min — ABNORMAL LOW (ref >60)
Glucose, Bld: 98 mg/dL (ref 70–99)
Potassium: 3.7 mmol/L (ref 3.5–5.1)
Sodium: 136 mmol/L (ref 135–145)

## 2022-06-04 LAB — MAGNESIUM: Magnesium: 1.8 mg/dL (ref 1.7–2.4)

## 2022-06-04 LAB — CBC
HCT: 32.5 % — ABNORMAL LOW (ref 39.0–52.0)
Hemoglobin: 10.6 g/dL — ABNORMAL LOW (ref 13.0–17.0)
MCH: 34.5 pg — ABNORMAL HIGH (ref 26.0–34.0)
MCHC: 32.6 g/dL (ref 30.0–36.0)
MCV: 105.9 fL — ABNORMAL HIGH (ref 80.0–100.0)
Platelets: 143 10*3/uL — ABNORMAL LOW (ref 150–400)
RBC: 3.07 MIL/uL — ABNORMAL LOW (ref 4.22–5.81)
RDW: 12.6 % (ref 11.5–15.5)
WBC: 7.1 10*3/uL (ref 4.0–10.5)
nRBC: 0 % (ref 0.0–0.2)

## 2022-06-04 NOTE — Progress Notes (Addendum)
This CSW went to speak with the family to inform them of PT's recommendation for SNF. Family agreed to SNF and confirmed they are only wanting the pt to be faxed out to [facility] since he has been there previously. Dr. Manuella Ghazi states that the pt should be medically stable for d/c on Sunday, 10/22. Per [employer] note, the ALF thinks they can accept the pt back as his condition is not much different from the time of admission.This CSW contacted Maudie Mercury, the pt's other daughter who is [facility] POA, and Maudie Mercury has decided to wait until Monday to confirm that [facility] is able to meet her dad's needs. Maudie Mercury prefers to know whether the pt may return to the ALF with [facility] PT before beginning the process for SNF. Kim briefly mentioned paying for assistance to come in for a week or two to assist her dad in the ALF. Kim inquired about [facility] staff coming to physically assess the pt to determine whether they are still able to accommodate his needs. This CSW informed Maudie Mercury that typically the hospitals provided facilities with clinical information and their DON/Admissions Rep reviews it to determine whether they can meet a pt's needs. Maudie Mercury stated she understood.  TOC to follow up with ALF on Monday to see if the pt could return with [facility] before faxing out to SNF. HH PT may be an option if allowed by ALF.

## 2022-06-04 NOTE — Progress Notes (Signed)
PROGRESS NOTE    Benjamin Mason  VHQ:[id number] DOB: Sep 27, 1930 DOA: 06/02/2022 PCP: Manon Hilding, MD   Brief Narrative:  Benjamin Mason is a 88 y.o. male with medical history significant of vascular dementia, depression, BPH, GERD, SSS s/p pacemaker, A-fib on Eliquis implant who presents emergency department via EMS from nursing home facility due to an unwitnessed fall.  He was noted to have subdural hemorrhage with nondisplaced fracture of the occipital calvarium which has remained stable.  His Eliquis was reversed and he now remains off anticoagulation.  He was also noted to have atrial fibrillation with RVR requiring rate control and was started on IV amiodarone.  Plan is to discontinue this at this point and start metoprolol for rate control per cardiology.  PT recommending SNF.  Assessment & Plan:   Principal Problem:   Subdural hemorrhage (HCC) Active Problems:   GERD (gastroesophageal reflux disease)   CKD (chronic kidney disease), stage III ([place])   Fall at home, initial encounter   Altered mental status   Hypomagnesemia   Macrocytic anemia   Chronic atrial fibrillation with RVR (HCC)   Vascular dementia (HCC)   Depression   BPH (benign prostatic hyperplasia)   Subdural hematoma (HCC)  Assessment and Plan:   Subdural hemorrhage s/p post Fall CT head without contrast showed nondisplaced  fracture of the occipital calvarium. Repeat head CT with no significant change, neurosurgery to follow-up outpatient in 3/4 weeks, discontinue Eliquis Continue fall precaution Continue PT/OT eval and treat Factor Xa recombinant was given to reverse Eliquis, currently on SCDs and plans for no further Eliquis on discharge -Repeat head CT 10/21 with no significant findings   Altered mental status possibly secondary to above Continue management as described above   Hypertension-poorly controlled Hydralazine ordered as needed for blood pressure control to maintain systolic BP  less than 413 Hold home midodrine Metoprolol twice daily per cardiology to be started   Chronic A-fib with RVR with pacemaker Discontinue amiodarone, unclear why this was initiated Start metoprolol 12.5 mg twice daily per cardiology recommendation No anticoagulation due to above   Macrocytic anemia Vitamin B12 and folate levels will be checked   Vascular dementia Aricept   Depression Zoloft    BPH Resume home medication   GERD Continue IV Protonix      DVT prophylaxis: SCDs Code Status: Full Family Communication: Spoke with granddaughter and daughters at bedside 10/21 Disposition Plan:  Status is: Inpatient Remains inpatient appropriate because: IV medications/fluids    Consultants:  Discussed with neurosurgery Cardiology   Procedures:  None   Antimicrobials:  None   Subjective: Patient seen and evaluated today with some ongoing confusion with daughter at bedside.  No acute overnight events noted.  He denies any significant headache or pain.  Objective: Vitals:   06/04/22 0500 06/04/22 0600 06/04/22 0610 06/04/22 0646  BP: (!) 169/76 (!) 170/82 (!) 165/84 (!) 159/73  Pulse: 92 91    Resp: 18     Temp: 99 F (37.2 C) 98.4 F (36.9 C)    TempSrc: Oral Oral    SpO2: 95% 97%    Weight:   67.6 kg   Height:        Intake/Output Summary (Last 24 hours) at 06/04/2022 1048 Last data filed at 06/03/2022 2308 Gross per 24 hour  Intake 132.52 ml  Output 800 ml  Net -667.48 ml   Filed Weights   06/03/22 0520 06/04/22 0610  Weight: 71.2 kg 67.6 kg    Examination:  General exam: Appears calm and comfortable, pleasantly confused Respiratory system: Clear to auscultation. Respiratory effort normal. Cardiovascular system: S1 & S2 heard, RRR.  Gastrointestinal system: Abdomen is soft Central nervous system: Alert and awake Extremities: No edema Skin: No significant lesions noted Psychiatry: Flat affect.    Data Reviewed: I have personally reviewed  following labs and imaging studies  CBC: Recent Labs  Lab 06/02/22 2145 06/03/22 0336 06/04/22 0414  WBC 6.0 7.2 7.1  NEUTROABS 5.2  --   --   HGB 9.3* 9.8* 10.6*  HCT 28.9* 30.6* 32.5*  MCV 107.0* 107.4* 105.9*  PLT 167 170 154*   Basic Metabolic Panel: Recent Labs  Lab 06/02/22 2145 06/03/22 0336 06/04/22 0414  NA 136 138 136  K 4.1 4.0 3.7  CL 106 106 103  CO2 '23 23 23  '$ GLUCOSE 164* 143* 98  BUN 39* 34* 28*  CREATININE 1.86* 1.64* 1.53*  CALCIUM 8.7* 9.3 9.4  MG 1.6* 2.1 1.8  PHOS  --  2.5  --    GFR: Estimated Creatinine Clearance: 31.9 mL/min (A) (by C-G formula based on SCr of 1.53 mg/dL (H)). Liver Function Tests: Recent Labs  Lab 06/03/22 0336  AST 41  ALT 24  ALKPHOS 61  BILITOT 0.7  PROT 6.8  ALBUMIN 3.7   No results for input(s): "LIPASE", "AMYLASE" in the last 168 hours. No results for input(s): "AMMONIA" in the last 168 hours. Coagulation Profile: No results for input(s): "INR", "PROTIME" in the last 168 hours. Cardiac Enzymes: No results for input(s): "CKTOTAL", "CKMB", "CKMBINDEX", "TROPONINI" in the last 168 hours. BNP (last 3 results) No results for input(s): "PROBNP" in the last 8760 hours. HbA1C: No results for input(s): "HGBA1C" in the last 72 hours. CBG: No results for input(s): "GLUCAP" in the last 168 hours. Lipid Profile: No results for input(s): "CHOL", "HDL", "LDLCALC", "TRIG", "CHOLHDL", "LDLDIRECT" in the last 72 hours. Thyroid Function Tests: No results for input(s): "TSH", "T4TOTAL", "FREET4", "T3FREE", "THYROIDAB" in the last 72 hours. Anemia Panel: Recent Labs    06/03/22 0336  VITAMINB12 581  FOLATE 13.4   Sepsis Labs: No results for input(s): "PROCALCITON", "LATICACIDVEN" in the last 168 hours.  Recent Results (from the past 240 hour(s))  MRSA Next Gen by PCR, Nasal     Status: None   Collection Time: 06/03/22  1:46 AM   Specimen: Nasal Mucosa; Nasal Swab  Result Value Ref Range Status   MRSA by PCR Next Gen  NOT DETECTED NOT DETECTED Final    Comment: (NOTE) The GeneXpert MRSA Assay (FDA approved for NASAL specimens only), is one component of a comprehensive MRSA colonization surveillance program. It is not intended to diagnose MRSA infection nor to guide or monitor treatment for MRSA infections. Test performance is not FDA approved in patients less than 46 years old. Performed at [facility], [address], [place], [place] [address]          Radiology Studies: CT HEAD WO CONTRAST (5MM)  Result Date: 06/04/2022 CLINICAL DATA:  Follow-up subdural hematoma EXAM: CT HEAD WITHOUT CONTRAST TECHNIQUE: Contiguous axial images were obtained from the base of the skull through the vertex without intravenous contrast. RADIATION DOSE REDUCTION: This exam was performed according to the departmental dose-optimization program which includes automated exposure control, adjustment of the mA and/or kV according to patient size and/or use of iterative reconstruction technique. COMPARISON:  Yesterday FINDINGS: Brain: Unchanged left more than right inferior temporal cerebral contusion without significant mass effect. No high-density subdural clot seen  today. Question low-density subdural collections around both cerebral convexities measuring up to 3 mm in thickness anteriorly, which would be stable. No infarct, hydrocephalus, or mass. Chronic small vessel ischemia in the cerebral white matter Vascular: No hyperdense vessel or unexpected calcification. Skull: Known nondisplaced occipital bone fracture. Sinuses/Orbits: No acute finding. IMPRESSION: No progression of small inferior temporal contusions. High-density subdural and subarachnoid blood clot has resolved. Electronically Signed   By: Jorje Guild M.D.   On: 06/04/2022 09:47   CT HEAD WO CONTRAST (5MM)  Result Date: 06/03/2022 CLINICAL DATA:  Follow-up subdural hemorrhage EXAM: CT HEAD WITHOUT CONTRAST TECHNIQUE: Contiguous axial images were obtained from  the base of the skull through the vertex without intravenous contrast. RADIATION DOSE REDUCTION: This exam was performed according to the departmental dose-optimization program which includes automated exposure control, adjustment of the mA and/or kV according to patient size and/or use of iterative reconstruction technique. COMPARISON:  Yesterday FINDINGS: Brain: Blooming hemorrhagic contusion in the inferior left temporal lobe, hemorrhagic area measuring 11 mm. Milder right inferior temporal hemorrhagic contusion is also suspected. Unchanged trace subdural hemorrhage in the right parafalcine and tentorial convexities. Trace subarachnoid hemorrhage at the right paramedian vertex, see 4: 43. No cerebral mass effect. No evidence of acute infarct or hydrocephalus. Chronic small vessel ischemia. Vascular: No hyperdense vessel or unexpected calcification. Skull: Left occipital skull fracture, nondepressed. Sinuses/Orbits: No acute finding IMPRESSION: 1. Expected blooming of hemorrhagic contusion in the inferior left temporal lobe, hemorrhage measuring 11 mm. Milder hemorrhagic contusion likely also present in the inferior right temporal lobe. 2. Unchanged trace subarachnoid and subdural hemorrhage. No cerebral mass effect. Electronically Signed   By: Jorje Guild M.D.   On: 06/03/2022 10:45   CT HEAD WO CONTRAST (5MM)  Result Date: 06/02/2022 CLINICAL DATA:  Trauma. EXAM: CT HEAD WITHOUT CONTRAST CT CERVICAL SPINE WITHOUT CONTRAST TECHNIQUE: Multidetector CT imaging of the head and cervical spine was performed following the standard protocol without intravenous contrast. Multiplanar CT image reconstructions of the cervical spine were also generated. RADIATION DOSE REDUCTION: This exam was performed according to the departmental dose-optimization program which includes automated exposure control, adjustment of the mA and/or kV according to patient size and/or use of iterative reconstruction technique. COMPARISON:   Head CT dated 12/21/2021. FINDINGS: CT HEAD FINDINGS Brain: Moderate age-related atrophy and chronic microvascular ischemic changes. Small right posterior parafalcine subdural hemorrhage measures 3 mm in thickness. Additional small subdural hemorrhage noted along the right tentorium measuring 6 mm in thickness. No mass effect midline shift. Vascular: No hyperdense vessel or unexpected calcification. Skull: Nondisplaced fracture of the occipital calvarium. Sinuses/Orbits: No acute finding. Other: None CT CERVICAL SPINE FINDINGS Alignment: No acute subluxation. Skull base and vertebrae: No acute fracture. Soft tissues and spinal canal: No prevertebral fluid or swelling. No visible canal hematoma. Disc levels:  No acute findings.  Multilevel degenerative changes. Upper chest: Negative. Other: Bilateral carotid bulb calcified plaques. IMPRESSION: 1. Nondisplaced fracture of the occipital calvarium. 2. Small right posterior parafalcine and right supratentorial subdural hemorrhage. No mass effect or midline shift. 3. Moderate age-related atrophy and chronic microvascular ischemic changes. 4. No acute/traumatic cervical spine pathology. These results were called by telephone at the time of interpretation on 06/02/2022 at 7:10 pm to provider [facility] , who verbally acknowledged these results. Electronically Signed   By: Anner Crete M.D.   On: 06/02/2022 19:16   CT CERVICAL SPINE WO CONTRAST  Result Date: 06/02/2022 CLINICAL DATA:  Trauma. EXAM: CT HEAD WITHOUT CONTRAST CT  CERVICAL SPINE WITHOUT CONTRAST TECHNIQUE: Multidetector CT imaging of the head and cervical spine was performed following the standard protocol without intravenous contrast. Multiplanar CT image reconstructions of the cervical spine were also generated. RADIATION DOSE REDUCTION: This exam was performed according to the departmental dose-optimization program which includes automated exposure control, adjustment of the mA and/or kV  according to patient size and/or use of iterative reconstruction technique. COMPARISON:  Head CT dated 12/21/2021. FINDINGS: CT HEAD FINDINGS Brain: Moderate age-related atrophy and chronic microvascular ischemic changes. Small right posterior parafalcine subdural hemorrhage measures 3 mm in thickness. Additional small subdural hemorrhage noted along the right tentorium measuring 6 mm in thickness. No mass effect midline shift. Vascular: No hyperdense vessel or unexpected calcification. Skull: Nondisplaced fracture of the occipital calvarium. Sinuses/Orbits: No acute finding. Other: None CT CERVICAL SPINE FINDINGS Alignment: No acute subluxation. Skull base and vertebrae: No acute fracture. Soft tissues and spinal canal: No prevertebral fluid or swelling. No visible canal hematoma. Disc levels:  No acute findings.  Multilevel degenerative changes. Upper chest: Negative. Other: Bilateral carotid bulb calcified plaques. IMPRESSION: 1. Nondisplaced fracture of the occipital calvarium. 2. Small right posterior parafalcine and right supratentorial subdural hemorrhage. No mass effect or midline shift. 3. Moderate age-related atrophy and chronic microvascular ischemic changes. 4. No acute/traumatic cervical spine pathology. These results were called by telephone at the time of interpretation on 06/02/2022 at 7:10 pm to provider [facility] , who verbally acknowledged these results. Electronically Signed   By: Anner Crete M.D.   On: 06/02/2022 19:16        Scheduled Meds:  brimonidine  1 drop Both Eyes BID   donepezil  10 mg Oral QHS   finasteride  5 mg Oral Daily   haloperidol lactate  2 mg Intravenous Once   loratadine  10 mg Oral q AM   metoprolol tartrate  12.5 mg Oral BID   pantoprazole  40 mg Oral Daily   pantoprazole (PROTONIX) IV  40 mg Intravenous Q24H   sertraline  50 mg Oral q morning     LOS: 1 day    Time spent: 35 minutes    Ambrielle Kington Darleen Crocker, DO [facility]  If  7PM-7AM, please contact night-coverage [email or web address] 06/04/2022, 10:48 AM

## 2022-06-05 DIAGNOSIS — I6200 Nontraumatic subdural hemorrhage, unspecified: Secondary | ICD-10-CM | POA: Diagnosis not present

## 2022-06-05 MED ORDER — METOPROLOL TARTRATE 25 MG PO TABS
25.0000 mg | ORAL_TABLET | Freq: Two times a day (BID) | ORAL | Status: DC
  Administered 2022-06-05 – 2022-06-07 (×5): 25 mg via ORAL
  Filled 2022-06-05 (×5): qty 1

## 2022-06-05 MED ORDER — NEOMYCIN-POLYMYXIN-DEXAMETH 3.5-10000-0.1 OP SUSP
1.0000 [drp] | Freq: Four times a day (QID) | OPHTHALMIC | Status: AC
  Administered 2022-06-05 – 2022-06-06 (×8): 1 [drp] via OPHTHALMIC
  Filled 2022-06-05: qty 5

## 2022-06-05 NOTE — Progress Notes (Signed)
Speech Language Pathology Treatment: Dysphagia  Patient Details Name: Benjamin Mason MRN: [id number] DOB: 02-Jan-1931 Today's Date: 06/05/2022 Time: 3174-0992 SLP Time Calculation (min) (ACUTE ONLY): 21 min  Assessment / Plan / Recommendation Clinical Impression  Pt seen for ongoing dysphagia intervention following BSE completed on Friday. Pt was just finishing his breakfast meal seated upright in chair with daughter present. Pt reports h/o esophageal dysphagia and he compensates for this by "chewing 40 times" per bite. Pt consumed 100% of his breakfast meal and then drank water and graham crackers with SLP. Pt with one delayed throat clear after sequential straw sips water. Pt reports past h/o dilation without alleviation of symptoms. Current diet continues to be appropriate for Pt. Reflux, esophageal, and oral care precautions reviewed with Pt and daughter. SLP will sign off.    HPI HPI: JOCOB DAMBACH is a 88 y.o. male with medical history significant of vascular dementia, depression, BPH, GERD, SSS s/p pacemaker, A-fib on Eliquis implant who presents emergency department via EMS from nursing home facility due to an unwitnessed fall.  Patient was unable to provide history, history was obtained from ED physician and daughter at bedside.  Per report, patient lost balance while walking around in the facility and fell backwards hitting the back of his head with a bruise at the injured site.  Patient only complained of minor headache.  At baseline, patient was able to ambulate with a cane and maintain some conversation.  EMS was activated and patient was sent to the ED for further evaluation and management. BSE requested      SLP Plan  All goals met;Discharge SLP treatment due to (comment)      Recommendations for follow up therapy are one component of a multi-disciplinary discharge planning process, led by the attending physician.  Recommendations may be updated based on patient status,  additional functional criteria and insurance authorization.    Recommendations  Diet recommendations: Dysphagia 3 (mechanical soft);Thin liquid Liquids provided via: Cup;Straw Medication Administration: Whole meds with liquid Supervision: Patient able to self feed Compensations: Slow rate;Small sips/bites Postural Changes and/or Swallow Maneuvers: Out of bed for meals;Seated upright 90 degrees;Upright 30-60 min after meal                Oral Care Recommendations: Oral care BID Follow Up Recommendations: Follow physician's recommendations for discharge plan and follow up therapies Assistance recommended at discharge: Frequent or constant Supervision/Assistance SLP Visit Diagnosis: Dysphagia, unspecified (R13.10) Plan: All goals met;Discharge SLP treatment due to (comment)          Thank you,  Genene Churn, [place]  06/05/2022, 10:46 AM

## 2022-06-05 NOTE — Progress Notes (Signed)
PROGRESS NOTE    Benjamin Mason  DZH:[id number] DOB: 03-Sep-1930 DOA: 06/02/2022 PCP: Manon Hilding, MD   Brief Narrative:    Benjamin Mason is a 88 y.o. male with medical history significant of vascular dementia, depression, BPH, GERD, SSS s/p pacemaker, A-fib on Eliquis implant who presents emergency department via EMS from nursing home facility due to an unwitnessed fall.  He was noted to have subdural hemorrhage with nondisplaced fracture of the occipital calvarium which has remained stable.  His Eliquis was reversed and he now remains off anticoagulation.  He was also noted to have atrial fibrillation with RVR requiring rate control and was started on IV amiodarone.  Plan is to discontinue this at this point and start metoprolol for rate control per cardiology.  PT recommending SNF, but family interested in going back to ALF.  Assessment & Plan:   Principal Problem:   Subdural hemorrhage (HCC) Active Problems:   GERD (gastroesophageal reflux disease)   CKD (chronic kidney disease), stage III ([place])   Fall at home, initial encounter   Altered mental status   Hypomagnesemia   Macrocytic anemia   Chronic atrial fibrillation with RVR (HCC)   Vascular dementia (HCC)   Depression   BPH (benign prostatic hyperplasia)   Subdural hematoma (HCC)  Assessment and Plan:   Subdural hemorrhage s/p post Fall CT head without contrast showed nondisplaced  fracture of the occipital calvarium. Repeat head CT with no significant change, neurosurgery to follow-up outpatient in 3/4 weeks, discontinue Eliquis Continue fall precaution Continue PT/OT eval and treat Factor Xa recombinant was given to reverse Eliquis, currently on SCDs and plans for no further Eliquis on discharge -Repeat head CT 10/21 with no significant findings   Altered mental status possibly secondary to above Continue management as described above   Hypertension-poorly controlled Hydralazine ordered as needed for  blood pressure control to maintain systolic BP less than 419 Hold home midodrine Metoprolol twice daily per cardiology to be started   Chronic A-fib with RVR with pacemaker Discontinue amiodarone, unclear why this was initiated Start metoprolol 12.5 mg twice daily per cardiology recommendation, increase to 25 mg twice daily No anticoagulation due to above   Macrocytic anemia Stable and B12 and folate levels within normal limits   Vascular dementia Aricept   Depression Zoloft    BPH Resume home medication   GERD Continue IV Protonix      DVT prophylaxis: SCDs Code Status: Full Family Communication: Spoke with granddaughter and daughters at bedside 10/22 Disposition Plan:  Status is: Inpatient Remains inpatient appropriate because: IV medications/fluids     Consultants:  Discussed with neurosurgery Cardiology   Procedures:  None   Antimicrobials:  None   Subjective: Patient seen and evaluated today with no new acute complaints or concerns. No acute concerns or events noted overnight.  Objective: Vitals:   06/04/22 0646 06/04/22 1248 06/04/22 2029 06/05/22 0533  BP: (!) 159/73 134/84 (!) 142/81 (!) 157/82  Pulse:  90 98 97  Resp:  '18 17 18  '$ Temp:  99.4 F (37.4 C) 98.2 F (36.8 C) 97.6 F (36.4 C)  TempSrc:  Oral    SpO2:  97% 97% 95%  Weight:      Height:        Intake/Output Summary (Last 24 hours) at 06/05/2022 0937 Last data filed at 06/04/2022 1842 Gross per 24 hour  Intake 480 ml  Output 400 ml  Net 80 ml   Filed Weights   06/03/22 0520 06/04/22  5170  Weight: 71.2 kg 67.6 kg    Examination:  General exam: Appears calm and comfortable, pleasantly confused Respiratory system: Clear to auscultation. Respiratory effort normal. Cardiovascular system: S1 & S2 heard, RRR.  Gastrointestinal system: Abdomen is soft Central nervous system: Alert and awake Extremities: No edema Skin: No significant lesions noted Psychiatry: Flat  affect.    Data Reviewed: I have personally reviewed following labs and imaging studies  CBC: Recent Labs  Lab 06/02/22 2145 06/03/22 0336 06/04/22 0414  WBC 6.0 7.2 7.1  NEUTROABS 5.2  --   --   HGB 9.3* 9.8* 10.6*  HCT 28.9* 30.6* 32.5*  MCV 107.0* 107.4* 105.9*  PLT 167 170 017*   Basic Metabolic Panel: Recent Labs  Lab 06/02/22 2145 06/03/22 0336 06/04/22 0414  NA 136 138 136  K 4.1 4.0 3.7  CL 106 106 103  CO2 '23 23 23  '$ GLUCOSE 164* 143* 98  BUN 39* 34* 28*  CREATININE 1.86* 1.64* 1.53*  CALCIUM 8.7* 9.3 9.4  MG 1.6* 2.1 1.8  PHOS  --  2.5  --    GFR: Estimated Creatinine Clearance: 31.9 mL/min (A) (by C-G formula based on SCr of 1.53 mg/dL (H)). Liver Function Tests: Recent Labs  Lab 06/03/22 0336  AST 41  ALT 24  ALKPHOS 61  BILITOT 0.7  PROT 6.8  ALBUMIN 3.7   No results for input(s): "LIPASE", "AMYLASE" in the last 168 hours. No results for input(s): "AMMONIA" in the last 168 hours. Coagulation Profile: No results for input(s): "INR", "PROTIME" in the last 168 hours. Cardiac Enzymes: No results for input(s): "CKTOTAL", "CKMB", "CKMBINDEX", "TROPONINI" in the last 168 hours. BNP (last 3 results) No results for input(s): "PROBNP" in the last 8760 hours. HbA1C: No results for input(s): "HGBA1C" in the last 72 hours. CBG: No results for input(s): "GLUCAP" in the last 168 hours. Lipid Profile: No results for input(s): "CHOL", "HDL", "LDLCALC", "TRIG", "CHOLHDL", "LDLDIRECT" in the last 72 hours. Thyroid Function Tests: No results for input(s): "TSH", "T4TOTAL", "FREET4", "T3FREE", "THYROIDAB" in the last 72 hours. Anemia Panel: Recent Labs    06/03/22 0336  VITAMINB12 581  FOLATE 13.4   Sepsis Labs: No results for input(s): "PROCALCITON", "LATICACIDVEN" in the last 168 hours.  Recent Results (from the past 240 hour(s))  MRSA Next Gen by PCR, Nasal     Status: None   Collection Time: 06/03/22  1:46 AM   Specimen: Nasal Mucosa; Nasal  Swab  Result Value Ref Range Status   MRSA by PCR Next Gen NOT DETECTED NOT DETECTED Final    Comment: (NOTE) The GeneXpert MRSA Assay (FDA approved for NASAL specimens only), is one component of a comprehensive MRSA colonization surveillance program. It is not intended to diagnose MRSA infection nor to guide or monitor treatment for MRSA infections. Test performance is not FDA approved in patients less than 14 years old. Performed at [facility], [address] [place], [place] [address]          Radiology Studies: CT HEAD WO CONTRAST (5MM)  Result Date: 06/04/2022 CLINICAL DATA:  Follow-up subdural hematoma EXAM: CT HEAD WITHOUT CONTRAST TECHNIQUE: Contiguous axial images were obtained from the base of the skull through the vertex without intravenous contrast. RADIATION DOSE REDUCTION: This exam was performed according to the departmental dose-optimization program which includes automated exposure control, adjustment of the mA and/or kV according to patient size and/or use of iterative reconstruction technique. COMPARISON:  Yesterday FINDINGS: Brain: Unchanged left more than right inferior  temporal cerebral contusion without significant mass effect. No high-density subdural clot seen today. Question low-density subdural collections around both cerebral convexities measuring up to 3 mm in thickness anteriorly, which would be stable. No infarct, hydrocephalus, or mass. Chronic small vessel ischemia in the cerebral white matter Vascular: No hyperdense vessel or unexpected calcification. Skull: Known nondisplaced occipital bone fracture. Sinuses/Orbits: No acute finding. IMPRESSION: No progression of small inferior temporal contusions. High-density subdural and subarachnoid blood clot has resolved. Electronically Signed   By: Jorje Guild M.D.   On: 06/04/2022 09:47        Scheduled Meds:  brimonidine  1 drop Both Eyes BID   donepezil  10 mg Oral QHS   finasteride  5 mg Oral Daily    haloperidol lactate  2 mg Intravenous Once   loratadine  10 mg Oral q AM   metoprolol tartrate  25 mg Oral BID   neomycin-polymyxin b-dexamethasone  1 drop Right Eye Q6H   pantoprazole  40 mg Oral Daily   pantoprazole (PROTONIX) IV  40 mg Intravenous Q24H   sertraline  50 mg Oral q morning     LOS: 2 days    Time spent: 35 minutes    Timon Geissinger Darleen Crocker, DO [facility]  If 7PM-7AM, please contact night-coverage [email or web address] 06/05/2022, 9:37 AM

## 2022-06-06 DIAGNOSIS — I6200 Nontraumatic subdural hemorrhage, unspecified: Secondary | ICD-10-CM | POA: Diagnosis not present

## 2022-06-06 DIAGNOSIS — I4891 Unspecified atrial fibrillation: Secondary | ICD-10-CM

## 2022-06-06 MED ORDER — PANTOPRAZOLE SODIUM 40 MG PO TBEC
40.0000 mg | DELAYED_RELEASE_TABLET | Freq: Every day | ORAL | Status: DC
  Administered 2022-06-07: 40 mg via ORAL
  Filled 2022-06-06: qty 1

## 2022-06-06 NOTE — Progress Notes (Addendum)
Progress Note  Patient Name: ABDULRAHMAN BRACEY Date of Encounter: 06/06/2022  Primary Cardiologist: Rozann Lesches, MD  Subjective   Pleasantly demented. Eating breakfast with daughter and 2 caregivers from [place] at bedside. No CP or SOB. HR 80s-90s on telemetry.  Inpatient Medications    Scheduled Meds:  brimonidine  1 drop Both Eyes BID   donepezil  10 mg Oral QHS   finasteride  5 mg Oral Daily   haloperidol lactate  2 mg Intravenous Once   loratadine  10 mg Oral q AM   metoprolol tartrate  25 mg Oral BID   neomycin-polymyxin b-dexamethasone  1 drop Right Eye Q6H   pantoprazole  40 mg Oral Daily   pantoprazole (PROTONIX) IV  40 mg Intravenous Q24H   sertraline  50 mg Oral q morning   Continuous Infusions:  PRN Meds: acetaminophen, haloperidol lactate, hydrALAZINE   Vital Signs    Vitals:   06/04/22 2029 06/05/22 0533 06/05/22 1247 06/05/22 2213  BP: (!) 142/81 (!) 157/82 132/75 113/64  Pulse: 98 97 81 90  Resp: '17 18 17 18  '$ Temp: 98.2 F (36.8 C) 97.6 F (36.4 C) 97.6 F (36.4 C) 98 F (36.7 C)  TempSrc:   Oral Oral  SpO2: 97% 95% 99% 96%  Weight:      Height:        Intake/Output Summary (Last 24 hours) at 06/06/2022 0849 Last data filed at 06/06/2022 0600 Gross per 24 hour  Intake 960 ml  Output 1800 ml  Net -840 ml      06/04/2022    6:10 AM 06/03/2022    5:20 AM 04/19/2022    1:16 PM  Last 3 Weights  Weight (lbs) 149 lb 0.5 oz 156 lb 15.5 oz 157 lb  Weight (kg) 67.6 kg 71.2 kg 71.215 kg     Telemetry    Afib occasional PVCs - Personally Reviewed  Physical Exam   GEN: No acute distress.  HEENT: Normocephalic, atraumatic, sclera non-icteric. Neck: No JVD or bruits. Cardiac: Irregularly irregular, rate controlled, no murmurs, rubs, or gallops.  Respiratory: Clear to auscultation bilaterally. Breathing is unlabored. GI: Soft, nontender, non-distended, BS +x 4. MS: no deformity. Extremities: No clubbing or cyanosis. No edema. Distal  pedal pulses are 2+ and equal bilaterally. Neuro:  A+O to self, daughter. Follows commands. Psych:  Responds to questions appropriately with a normal affect.Unless directly asked, does not specifically participate in the conversation.  Labs    High Sensitivity Troponin:  No results for input(s): "TROPONINIHS" in the last 720 hours.    Cardiac EnzymesNo results for input(s): "TROPONINI" in the last 168 hours. No results for input(s): "TROPIPOC" in the last 168 hours.   Chemistry Recent Labs  Lab 06/02/22 2145 06/03/22 0336 06/04/22 0414  NA 136 138 136  K 4.1 4.0 3.7  CL 106 106 103  CO2 '23 23 23  '$ GLUCOSE 164* 143* 98  BUN 39* 34* 28*  CREATININE 1.86* 1.64* 1.53*  CALCIUM 8.7* 9.3 9.4  PROT  --  6.8  --   ALBUMIN  --  3.7  --   AST  --  41  --   ALT  --  24  --   ALKPHOS  --  61  --   BILITOT  --  0.7  --   GFRNONAA 34* 40* 43*  ANIONGAP '7 9 10     '$ Hematology Recent Labs  Lab 06/02/22 2145 06/03/22 0336 06/04/22 0414  WBC 6.0 7.2 7.1  RBC  2.70* 2.85* 3.07*  HGB 9.3* 9.8* 10.6*  HCT 28.9* 30.6* 32.5*  MCV 107.0* 107.4* 105.9*  MCH 34.4* 34.4* 34.5*  MCHC 32.2 32.0 32.6  RDW 12.6 12.6 12.6  PLT 167 170 143*    BNPNo results for input(s): "BNP", "PROBNP" in the last 168 hours.   DDimer No results for input(s): "DDIMER" in the last 168 hours.   Radiology    CT HEAD WO CONTRAST (5MM)  Result Date: 06/04/2022 CLINICAL DATA:  Follow-up subdural hematoma EXAM: CT HEAD WITHOUT CONTRAST TECHNIQUE: Contiguous axial images were obtained from the base of the skull through the vertex without intravenous contrast. RADIATION DOSE REDUCTION: This exam was performed according to the departmental dose-optimization program which includes automated exposure control, adjustment of the mA and/or kV according to patient size and/or use of iterative reconstruction technique. COMPARISON:  Yesterday FINDINGS: Brain: Unchanged left more than right inferior temporal cerebral contusion  without significant mass effect. No high-density subdural clot seen today. Question low-density subdural collections around both cerebral convexities measuring up to 3 mm in thickness anteriorly, which would be stable. No infarct, hydrocephalus, or mass. Chronic small vessel ischemia in the cerebral white matter Vascular: No hyperdense vessel or unexpected calcification. Skull: Known nondisplaced occipital bone fracture. Sinuses/Orbits: No acute finding. IMPRESSION: No progression of small inferior temporal contusions. High-density subdural and subarachnoid blood clot has resolved. Electronically Signed   By: Jorje Guild M.D.   On: 06/04/2022 09:47    Cardiac Studies   N/A  Patient Profile     88 y.o. male with  CAD (s/p BMS to RCA and LAD in 1995, nuc 2020 without ischemia), permanent atrial fibrillation, h/o frequent PVCs, SSS (s/p St[employer] Jude PPM placement in 12/2019), orthostatic hypotension, HTN, HLD, Stage 3b CKD, dementia and IBS presented to [facility] with fall at SNF and subsequent SDH. Neurosurgery consulted and recommended reversal of Eliquis with Andexxa. Cardiology was consulted for AF RVR.  Assessment & Plan    1. Atrial Fibrillation with RVR, occasional PVCs - He is in permanent atrial fibrillation with known PVCs so persistence of arrhythmia is expected, though in RVR at time of admission likely aggravated by SDH - Initially started on IV amiodarone earlier in admission, transitioned to beta blocker instead - continue metoprolol '25mg'$  BID, last BP 113/64 - Per last rounding note, our team reviewed risks and benefits of anticoagulation with the patient's family and Eliquis has been discontinued and they wish to refer him to remain off of this going forward given that he is now having more frequent falls - TSH OK 04/2022  2. CAD - He is s/p BMS to RCA and LAD in 1995. Stress test in 07/2019 showed no ischemia. No reported anginal symptoms and would continue with medical therapy alone given  his dementia. - No aspirin given SDH - Not on statin PTA or in recent OV. Given advanced age and comorbidities, long term benefit questionable therefore can defer to OP setting   3. SSS - He does have a [employer]. Jude PPM placement in 12/2019. Follows with Dr. Caryl Comes, next OV planned 09/2022.   4. Stage 3b CKD - Baseline around 1.9-2.1, last 1.53   5. Subdural Hemorrhage, Macrocytic anemia - CT Head on admission showed a nondisplaced fracture of the occipital calvarium and a small right posterior parafalcine and right supratentorial subdural hemorrhage with no mass effect or midline shift. Neurosurgery following remotely. No plans for intervention at this time. Eliquis has been discontinued   Has f/u with Dr.  Domenic Polite 07/2022, would keep this visit.  For questions or updates, please contact [place] Please consult [email or web address] for contact info under Cardiology/STEMI.  Signed, Charlie Pitter, PA-C 06/06/2022, 8:49 AM    Attending note Patient seen and discussed with PA Dunn, I agree with her documentation. History of permanent afib admitted with RVR in setting of SDH. Currently rate controlled with lopressor '25mg'$  bid. Anticoag stopped due to frequent falls and SDH this admission. His chronic cardiac conditions including CAD, SSS with pacemaker are stable and defer long term management to his primary cardiologist. We will sign off inpatient care   Carlyle Dolly MD

## 2022-06-06 NOTE — TOC Progression Note (Signed)
Transition of Care [facility]) - Progression Note    Patient Details  Name: Benjamin Mason MRN: [id number] Date of Birth: [birth year]  [facility] CM/SW Contact  Shade Flood, LCSW Phone Number: 06/06/2022, 3:58 PM  Clinical Narrative:     TOC following. [facility] staff were here this AM to evaluate pt. They recommend pt go to SNF rehab if possible. They state that they can manage his care with [facility] if SNF cannot be arranged. Family requests [facility]. Referral sent. Awaiting their DON to review and make determination. Daughter aware that pt may need SNF with memory care unit and these options were reviewed as well.  TOC also spoke with Judson Roch at [facility] who states that they can follow pt for therapy at the ALF if that ends up being the dc plan.  Updated MD. Will follow up in AM.  Expected Discharge Plan: Assisted Living Barriers to Discharge: Continued Medical Work up  Expected Discharge Plan and Services Expected Discharge Plan: Assisted Living In-house Referral: Clinical Social Work     Living arrangements for the past 2 months: [place]                                       Social Determinants of Health (SDOH) Interventions    Readmission Risk Interventions     No data to display

## 2022-06-06 NOTE — Care Management Important Message (Signed)
Important Message  Patient Details  Name: Benjamin Mason MRN: [id number] Date of Birth: 08/05/1931   Medicare Important Message Given:  Yes     [name] 06/06/2022, 12:25 PM

## 2022-06-06 NOTE — Progress Notes (Signed)
PROGRESS NOTE    Benjamin Mason  SAY:[id number] DOB: 08/28/1930 DOA: 06/02/2022 PCP: Manon Hilding, MD   Brief Narrative:    Benjamin Mason is a 88 y.o. male with medical history significant of vascular dementia, depression, BPH, GERD, SSS s/p pacemaker, A-fib on Eliquis implant who presents emergency department via EMS from nursing home facility due to an unwitnessed fall.  He was noted to have subdural hemorrhage with nondisplaced fracture of the occipital calvarium which has remained stable.  His Eliquis was reversed and he now remains off anticoagulation.  He was also noted to have atrial fibrillation with RVR requiring rate control and was started on IV amiodarone.  Plan is to discontinue this at this point and start metoprolol for rate control per cardiology.  PT recommending SNF, but family interested in going back to ALF. TOC working on placement.  Assessment & Plan:   Principal Problem:   Subdural hemorrhage (HCC) Active Problems:   GERD (gastroesophageal reflux disease)   CKD (chronic kidney disease), stage III ([place])   Fall at home, initial encounter   Altered mental status   Hypomagnesemia   Macrocytic anemia   Chronic atrial fibrillation with RVR (HCC)   Vascular dementia (HCC)   Depression   BPH (benign prostatic hyperplasia)   Subdural hematoma (HCC)  Assessment and Plan:   Subdural hemorrhage s/p post Fall CT head without contrast showed nondisplaced  fracture of the occipital calvarium. Repeat head CT with no significant change, neurosurgery to follow-up outpatient in 3/4 weeks, discontinue Eliquis Continue fall precaution Continue PT/OT eval and treat Factor Xa recombinant was given to reverse Eliquis, currently on SCDs and plans for no further Eliquis on discharge -Repeat head CT 10/21 with no significant findings   Altered mental status possibly secondary to above Continue management as described above   Hypertension-poorly  controlled Hydralazine ordered as needed for blood pressure control to maintain systolic BP less than 235 Hold home midodrine and resume if blood pressures become soft Metoprolol twice daily per cardiology to continue   Chronic A-fib with RVR with pacemaker Discontinue amiodarone, unclear why this was initiated Continue new metoprolol 25 mg twice daily with rhythm and sinus No anticoagulation due to above   Macrocytic anemia Stable and B12 and folate levels within normal limits   Vascular dementia Aricept   Depression Zoloft    BPH Resume home medication   GERD Continue IV Protonix      DVT prophylaxis: SCDs Code Status: Full Family Communication: Spoke with daughter at bedside 10/23 Disposition Plan:  Status is: Inpatient Remains inpatient appropriate because: IV medications/fluids     Consultants:  Discussed with neurosurgery Cardiology s/o 10/23   Procedures:  None   Antimicrobials:  None   Subjective: Patient seen and evaluated today with no new acute complaints or concerns. No acute concerns or events noted overnight.  Objective: Vitals:   06/04/22 2029 06/05/22 0533 06/05/22 1247 06/05/22 2213  BP: (!) 142/81 (!) 157/82 132/75 113/64  Pulse: 98 97 81 90  Resp: '17 18 17 18  '$ Temp: 98.2 F (36.8 C) 97.6 F (36.4 C) 97.6 F (36.4 C) 98 F (36.7 C)  TempSrc:   Oral Oral  SpO2: 97% 95% 99% 96%  Weight:      Height:        Intake/Output Summary (Last 24 hours) at 06/06/2022 1512 Last data filed at 06/06/2022 0600 Gross per 24 hour  Intake 480 ml  Output 1200 ml  Net -720 ml  Filed Weights   06/03/22 0520 06/04/22 0610  Weight: 71.2 kg 67.6 kg    Examination:  General exam: Appears calm and comfortable, pleasantly confused Respiratory system: Clear to auscultation. Respiratory effort normal. Cardiovascular system: S1 & S2 heard, RRR.  Gastrointestinal system: Abdomen is soft Central nervous system: Alert and awake Extremities: No  edema Skin: No significant lesions noted Psychiatry: Flat affect.    Data Reviewed: I have personally reviewed following labs and imaging studies  CBC: Recent Labs  Lab 06/02/22 2145 06/03/22 0336 06/04/22 0414  WBC 6.0 7.2 7.1  NEUTROABS 5.2  --   --   HGB 9.3* 9.8* 10.6*  HCT 28.9* 30.6* 32.5*  MCV 107.0* 107.4* 105.9*  PLT 167 170 884*   Basic Metabolic Panel: Recent Labs  Lab 06/02/22 2145 06/03/22 0336 06/04/22 0414  NA 136 138 136  K 4.1 4.0 3.7  CL 106 106 103  CO2 '23 23 23  '$ GLUCOSE 164* 143* 98  BUN 39* 34* 28*  CREATININE 1.86* 1.64* 1.53*  CALCIUM 8.7* 9.3 9.4  MG 1.6* 2.1 1.8  PHOS  --  2.5  --    GFR: Estimated Creatinine Clearance: 31.9 mL/min (A) (by C-G formula based on SCr of 1.53 mg/dL (H)). Liver Function Tests: Recent Labs  Lab 06/03/22 0336  AST 41  ALT 24  ALKPHOS 61  BILITOT 0.7  PROT 6.8  ALBUMIN 3.7   No results for input(s): "LIPASE", "AMYLASE" in the last 168 hours. No results for input(s): "AMMONIA" in the last 168 hours. Coagulation Profile: No results for input(s): "INR", "PROTIME" in the last 168 hours. Cardiac Enzymes: No results for input(s): "CKTOTAL", "CKMB", "CKMBINDEX", "TROPONINI" in the last 168 hours. BNP (last 3 results) No results for input(s): "PROBNP" in the last 8760 hours. HbA1C: No results for input(s): "HGBA1C" in the last 72 hours. CBG: No results for input(s): "GLUCAP" in the last 168 hours. Lipid Profile: No results for input(s): "CHOL", "HDL", "LDLCALC", "TRIG", "CHOLHDL", "LDLDIRECT" in the last 72 hours. Thyroid Function Tests: No results for input(s): "TSH", "T4TOTAL", "FREET4", "T3FREE", "THYROIDAB" in the last 72 hours. Anemia Panel: No results for input(s): "VITAMINB12", "FOLATE", "FERRITIN", "TIBC", "IRON", "RETICCTPCT" in the last 72 hours. Sepsis Labs: No results for input(s): "PROCALCITON", "LATICACIDVEN" in the last 168 hours.  Recent Results (from the past 240 hour(s))  MRSA Next  Gen by PCR, Nasal     Status: None   Collection Time: 06/03/22  1:46 AM   Specimen: Nasal Mucosa; Nasal Swab  Result Value Ref Range Status   MRSA by PCR Next Gen NOT DETECTED NOT DETECTED Final    Comment: (NOTE) The GeneXpert MRSA Assay (FDA approved for NASAL specimens only), is one component of a comprehensive MRSA colonization surveillance program. It is not intended to diagnose MRSA infection nor to guide or monitor treatment for MRSA infections. Test performance is not FDA approved in patients less than 72 years old. Performed at [facility] [address], [place], [place] [address]          Radiology Studies: No results found.      Scheduled Meds:  brimonidine  1 drop Both Eyes BID   donepezil  10 mg Oral QHS   finasteride  5 mg Oral Daily   haloperidol lactate  2 mg Intravenous Once   loratadine  10 mg Oral q AM   metoprolol tartrate  25 mg Oral BID   neomycin-polymyxin b-dexamethasone  1 drop Right Eye Q6H   [START ON 06/07/2022]  pantoprazole  40 mg Oral Daily   sertraline  50 mg Oral q morning     LOS: 3 days    Time spent: 35 minutes    Domonique Cothran Darleen Crocker, DO [facility]  If 7PM-7AM, please contact night-coverage [email or web address] 06/06/2022, 3:12 PM

## 2022-06-06 NOTE — NC FL2 (Signed)
[place] LEVEL OF CARE SCREENING TOOL     IDENTIFICATION  Patient Name: Benjamin Mason Birthdate: 1931/03/23 Sex: male Admission Date (Current Location): 06/02/2022  [facility] and [place] Number:  [employer] and Address:  [place] [address] [place]      Provider Number: [id number]  Attending Physician Name and Address:  Rodena Goldmann, DO  Relative Name and Phone Number:  Maudie Mercury (Daughter)  [phone]    Current Level of Care: Hospital Recommended Level of Care: [place] Prior Approval Number:    Date Approved/Denied:   PASRR Number:    Discharge Plan: SNF    Current Diagnoses: Patient Active Problem List   Diagnosis Date Noted   Fall at home, initial encounter 06/03/2022   Altered mental status 06/03/2022   Hypomagnesemia 06/03/2022   Macrocytic anemia 06/03/2022   Chronic atrial fibrillation with RVR ([place]) 06/03/2022   Vascular dementia ([place]) 06/03/2022   Depression 06/03/2022   BPH (benign prostatic hyperplasia) 06/03/2022   Subdural hematoma ([place]) 06/03/2022   Subdural hemorrhage ([place]) 06/02/2022   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[place]) 04/20/2022   Pacemaker 10/13/2020   Aortic regurgitation 06/10/2020   Mitral regurgitation 06/10/2020   Closed displaced intertrochanteric fracture of right femur ([place]) 06/10/2020   H/O artificial lens replacement 06/10/2020   History of sick sinus syndrome 06/10/2020   HTN (hypertension) 06/10/2020   Sick sinus syndrome ([place]) 12/29/2019   Sinus pause 12/28/2019   Permanent atrial fibrillation ([place]) 04/03/2018   Progressive angina ([place]) 11/18/2016   CKD (chronic kidney disease), stage III ([place]) 11/18/2016   Stage 3a chronic kidney disease ([place]) 11/18/2016   GERD (gastroesophageal reflux disease) 12/04/2013   Dysphagia, unspecified(787.20) 12/04/2013   Valvular heart disease 07/31/2013   Endocarditis, valve  unspecified 07/31/2013   Long term (current) use of anticoagulants 11/23/2010   Essential hypertension, benign 12/17/2009   Coronary atherosclerosis of native coronary artery 05/26/2009   Mixed hyperlipidemia 05/25/2009    Orientation RESPIRATION BLADDER Height & Weight     Self  Normal Incontinent Weight: 149 lb 0.5 oz (67.6 kg) Height:  '6\' 3"'$  (190.5 cm)  BEHAVIORAL SYMPTOMS/MOOD NEUROLOGICAL BOWEL NUTRITION STATUS      Incontinent Diet (regular)  AMBULATORY STATUS COMMUNICATION OF NEEDS Skin   Limited Assist Verbally Normal                       Personal Care Assistance Level of Assistance  Bathing, Feeding, Dressing Bathing Assistance: Maximum assistance Feeding assistance: Limited assistance Dressing Assistance: Maximum assistance     Functional Limitations Info  Sight, Hearing, Speech Sight Info: Adequate Hearing Info: Adequate Speech Info: Adequate    SPECIAL CARE FACTORS FREQUENCY  PT (By licensed PT), OT (By licensed OT)     PT Frequency: 5x week OT Frequency: 3x week            Contractures Contractures Info: Not present    Additional Factors Info  Code Status, Allergies Code Status Info: DNR Allergies Info: NKA           Current Medications (06/06/2022):  This is the current hospital active medication list Current Facility-Administered Medications  Medication Dose Route Frequency Provider Last Rate Last Admin   acetaminophen (TYLENOL) tablet 650 mg  650 mg Oral Q6H PRN Manuella Ghazi, Pratik D, DO   650 mg at 06/05/22 1612   brimonidine (ALPHAGAN) 0.2 % ophthalmic solution 1 drop  1 drop  Both Eyes BID Heath Lark D, DO   1 drop at 06/03/22 1459   donepezil (ARICEPT) tablet 10 mg  10 mg Oral QHS Manuella Ghazi, Pratik D, DO   10 mg at 06/05/22 2213   finasteride (PROSCAR) tablet 5 mg  5 mg Oral Daily Manuella Ghazi, Pratik D, DO   5 mg at 06/06/22 0815   haloperidol lactate (HALDOL) injection 2 mg  2 mg Intravenous Once Adefeso, Olad[place], DO       haloperidol lactate  (HALDOL) injection 2 mg  2 mg Intravenous Q6H PRN Manuella Ghazi, Pratik D, DO   2 mg at 06/05/22 2213   hydrALAZINE (APRESOLINE) injection 10 mg  10 mg Intravenous Q4H PRN Heath Lark D, DO   10 mg at 06/05/22 1613   loratadine (CLARITIN) tablet 10 mg  10 mg Oral q AM Manuella Ghazi, Pratik D, DO   10 mg at 06/06/22 3419   metoprolol tartrate (LOPRESSOR) tablet 25 mg  25 mg Oral BID Heath Lark D, DO   25 mg at 06/06/22 0815   neomycin-polymyxin b-dexamethasone (MAXITROL) ophthalmic suspension 1 drop  1 drop Right Eye Q6H Shah, Pratik D, DO   1 drop at 06/06/22 1209   [START ON 06/07/2022] pantoprazole (PROTONIX) EC tablet 40 mg  40 mg Oral Daily Manuella Ghazi, Pratik D, DO       sertraline (ZOLOFT) tablet 50 mg  50 mg Oral q morning Manuella Ghazi, Pratik D, DO   50 mg at 06/06/22 3790     Discharge Medications: Please see discharge summary for a list of discharge medications.  Relevant Imaging Results:  Relevant Lab Results:   Additional Information HH PT  Shade Flood, LCSW

## 2022-06-06 NOTE — Progress Notes (Signed)
Overnight, patient attempted to leave the bed to "go to the bathroom", and was reminded that he had a condom catheter on for voiding. RN administered PRN haldol, and patient slept 2 hours, then awoke and tried to exit the bed again, thinking it was time to get up in the morning. He did this again 2 hours later. He was reminded each time that it was not time to get up and got back into bed with no further issues. Patient remained pleasant and compliant with taking medications.

## 2022-06-06 NOTE — Progress Notes (Signed)
Physical Therapy Treatment Patient Details Name: Benjamin Mason MRN: [id number] DOB: August 19, 1930 Today's Date: 06/06/2022   History of Present Illness Benjamin Mason is a 88 y.o. male with medical history significant of vascular dementia, depression, BPH, GERD, SSS s/p pacemaker, A-fib on Eliquis implant who presents emergency department via EMS from nursing home facility due to an unwitnessed fall.  Patient was unable to provide history, history was obtained from ED physician and daughter at bedside.  Per report, patient lost balance while walking around in the facility and fell backwards hitting the back of his head with a bruise at the injured site.  Patient only complained of minor headache.  At baseline, patient was able to ambulate with a cane and maintain some conversation.  EMS was activated and patient was sent to the ED for further evaluation and management.    PT Comments    Patient presents standing in room with RW and family. Patient demonstrates increased endurance/distance for hallway ambulation with no loss of balance, safer with RW > SPC for ambulation/transfers. Patient put to bed after therapy due to fatigue with family present. Patient will benefit from continued skilled physical therapy in hospital and recommended venue below to increase strength, balance, endurance for safe ADLs and gait.    Recommendations for follow up therapy are one component of a multi-disciplinary discharge planning process, led by the attending physician.  Recommendations may be updated based on patient status, additional functional criteria and insurance authorization.  Follow Up Recommendations  Skilled nursing-short term rehab (<3 hours/day) Can patient physically be transported by private vehicle: Yes   Assistance Recommended at Discharge Intermittent Supervision/Assistance  Patient can return home with the following A little help with bathing/dressing/bathroom;Help with stairs or ramp for  entrance;Assistance with cooking/housework;A little help with walking and/or transfers   Equipment Recommendations  None recommended by PT    Recommendations for Other Services       Precautions / Restrictions Precautions Precautions: Fall Restrictions Weight Bearing Restrictions: No     Mobility  Bed Mobility Overal bed mobility: Needs Assistance Bed Mobility: Supine to Sit     Supine to sit: Min assist, Min guard     General bed mobility comments: increased time, labored movement    Transfers Overall transfer level: Needs assistance Equipment used: Rolling walker (2 wheels) Transfers: Sit to/from Stand Sit to Stand: Min guard           General transfer comment: increased time, labored movement, safer with RW>SPC    Ambulation/Gait Ambulation/Gait assistance: Min guard, Supervision Gait Distance (Feet): 100 Feet Assistive device: Rolling walker (2 wheels) Gait Pattern/deviations: Decreased step length - right, Decreased step length - left, Decreased stride length, Drifts right/left Gait velocity: decreased     General Gait Details: increased endurance/distance for hallway ambulation with no loss of balance, safer with RW > SPC for ambulation   Stairs             Wheelchair Mobility    Modified Rankin (Stroke Patients Only)       Balance Overall balance assessment: Needs assistance Sitting-balance support: Feet supported, No upper extremity supported Sitting balance-Leahy Scale: Good Sitting balance - Comments: fair/good seated EOB   Standing balance support: During functional activity, Bilateral upper extremity supported Standing balance-Leahy Scale: Good Standing balance comment: fair/poor without AD, good with RW  Cognition Arousal/Alertness: Awake/alert Behavior During Therapy: WFL for tasks assessed/performed Overall Cognitive Status: History of cognitive impairments - at baseline                                  General Comments: dementia        Exercises General Exercises - Lower Extremity Long Arc Quad: Seated, AROM, Strengthening, Both, 15 reps Hip Flexion/Marching: Seated, AROM, Strengthening, Both, 15 reps Toe Raises: Seated, AROM, Strengthening, 15 reps, Both Heel Raises: Seated, AROM, Strengthening, Both, 15 reps    General Comments        Pertinent Vitals/Pain Pain Assessment Pain Assessment: No/denies pain    Home Living                          Prior Function            PT Goals (current goals can now be found in the care plan section) Acute Rehab PT Goals Patient Stated Goal: return home with ALF staff to assist after rehab PT Goal Formulation: With patient/family Time For Goal Achievement: 06/20/22 Potential to Achieve Goals: Good Progress towards PT goals: Progressing toward goals    Frequency    Min 3X/week      PT Plan Current plan remains appropriate    Co-evaluation              AM-PAC PT "6 Clicks" Mobility   Outcome Measure  Help needed turning from your back to your side while in a flat bed without using bedrails?: A Little Help needed moving from lying on your back to sitting on the side of a flat bed without using bedrails?: A Little Help needed moving to and from a bed to a chair (including a wheelchair)?: A Little Help needed standing up from a chair using your arms (e.g., wheelchair or bedside chair)?: A Little Help needed to walk in hospital room?: A Little Help needed climbing 3-5 steps with a railing? : A Lot 6 Click Score: 17    End of Session   Activity Tolerance: Patient tolerated treatment well;Patient limited by fatigue Patient left: in bed;with call bell/phone within reach;with family/visitor present Nurse Communication: Mobility status PT Visit Diagnosis: Unsteadiness on feet (R26.81);Other abnormalities of gait and mobility (R26.89);Muscle weakness (generalized) (M62.81)      Time: 7793-9030 PT Time Calculation (min) (ACUTE ONLY): 20 min  Charges:  $Gait Training: 8-22 mins $Therapeutic Exercise: 8-22 mins                     Zigmund Gottron, SPT

## 2022-06-07 DIAGNOSIS — S065XAA Traumatic subdural hemorrhage with loss of consciousness status unknown, initial encounter: Secondary | ICD-10-CM

## 2022-06-07 LAB — CBC
HCT: 31.7 % — ABNORMAL LOW (ref 39.0–52.0)
Hemoglobin: 10.3 g/dL — ABNORMAL LOW (ref 13.0–17.0)
MCH: 34.6 pg — ABNORMAL HIGH (ref 26.0–34.0)
MCHC: 32.5 g/dL (ref 30.0–36.0)
MCV: 106.4 fL — ABNORMAL HIGH (ref 80.0–100.0)
Platelets: 178 10*3/uL (ref 150–400)
RBC: 2.98 MIL/uL — ABNORMAL LOW (ref 4.22–5.81)
RDW: 12.5 % (ref 11.5–15.5)
WBC: 6.5 10*3/uL (ref 4.0–10.5)
nRBC: 0 % (ref 0.0–0.2)

## 2022-06-07 LAB — BASIC METABOLIC PANEL
Anion gap: 10 (ref 5–15)
BUN: 43 mg/dL — ABNORMAL HIGH (ref 8–23)
CO2: 23 mmol/L (ref 22–32)
Calcium: 9.2 mg/dL (ref 8.9–10.3)
Chloride: 103 mmol/L (ref 98–111)
Creatinine, Ser: 1.95 mg/dL — ABNORMAL HIGH (ref 0.61–1.24)
GFR, Estimated: 32 mL/min — ABNORMAL LOW (ref >60)
Glucose, Bld: 97 mg/dL (ref 70–99)
Potassium: 3.9 mmol/L (ref 3.5–5.1)
Sodium: 136 mmol/L (ref 135–145)

## 2022-06-07 LAB — MAGNESIUM: Magnesium: 1.9 mg/dL (ref 1.7–2.4)

## 2022-06-07 MED ORDER — HALOPERIDOL LACTATE 5 MG/ML IJ SOLN: 2.0000 mg | Freq: Three times a day (TID) | INTRAMUSCULAR | Status: DC | PRN

## 2022-06-07 MED ORDER — METOPROLOL TARTRATE 25 MG PO TABS: 25.0000 mg | ORAL_TABLET | Freq: Two times a day (BID) | ORAL | 1 refills | Status: DC

## 2022-06-07 NOTE — NC FL2 (Signed)
[place] LEVEL OF CARE SCREENING TOOL     IDENTIFICATION  Patient Name: Benjamin Mason Birthdate: 1931-07-23 Sex: male Admission Date (Current Location): 06/02/2022  [facility] and [place] Number:  [employer] and Address:  [place] [address] [place]      Provider Number: [id number]  Attending Physician Name and Address:  Barton Dubois, MD  Relative Name and Phone Number:  Maudie Mercury (Daughter)  [phone]    Current Level of Care: Hospital Recommended Level of Care: [place] Prior Approval Number:    Date Approved/Denied:   PASRR Number:    Discharge Plan: Other (Comment) (ALF)    Current Diagnoses: Patient Active Problem List   Diagnosis Date Noted   Fall at home, initial encounter 06/03/2022   Altered mental status 06/03/2022   Hypomagnesemia 06/03/2022   Macrocytic anemia 06/03/2022   Chronic atrial fibrillation with RVR ([place]) 06/03/2022   Vascular dementia ([place]) 06/03/2022   Depression 06/03/2022   BPH (benign prostatic hyperplasia) 06/03/2022   Subdural hematoma ([place]) 06/03/2022   Subdural hemorrhage ([place]) 06/02/2022   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[place]) 04/20/2022   Pacemaker 10/13/2020   Aortic regurgitation 06/10/2020   Mitral regurgitation 06/10/2020   Closed displaced intertrochanteric fracture of right femur ([place]) 06/10/2020   H/O artificial lens replacement 06/10/2020   History of sick sinus syndrome 06/10/2020   HTN (hypertension) 06/10/2020   Sick sinus syndrome ([place]) 12/29/2019   Sinus pause 12/28/2019   Permanent atrial fibrillation ([place]) 04/03/2018   Progressive angina ([place]) 11/18/2016   CKD (chronic kidney disease), stage III ([place]) 11/18/2016   Stage 3a chronic kidney disease ([place]) 11/18/2016   GERD (gastroesophageal reflux disease) 12/04/2013   Dysphagia, unspecified(787.20) 12/04/2013   Valvular heart disease 07/31/2013    Endocarditis, valve unspecified 07/31/2013   Long term (current) use of anticoagulants 11/23/2010   Essential hypertension, benign 12/17/2009   Coronary atherosclerosis of native coronary artery 05/26/2009   Mixed hyperlipidemia 05/25/2009    Orientation RESPIRATION BLADDER Height & Weight     Self, Place  Normal Continent Weight: 149 lb 0.5 oz (67.6 kg) Height:  '6\' 3"'$  (190.5 cm)  BEHAVIORAL SYMPTOMS/MOOD NEUROLOGICAL BOWEL NUTRITION STATUS      Continent Diet (regular)  AMBULATORY STATUS COMMUNICATION OF NEEDS Skin   Supervision Verbally Normal                       Personal Care Assistance Level of Assistance  Bathing, Feeding, Dressing Bathing Assistance: Limited assistance Feeding assistance: Independent Dressing Assistance: Limited assistance     Functional Limitations Info  Sight, Hearing, Speech Sight Info: Adequate Hearing Info: Adequate Speech Info: Adequate    SPECIAL CARE FACTORS FREQUENCY  PT (By licensed PT), OT (By licensed OT)     PT Frequency: HH PT OT Frequency: No OT follow up            Contractures Contractures Info: Not present    Additional Factors Info  Code Status, Allergies Code Status Info: DNR Allergies Info: NKA           Current Medications (06/07/2022):  This is the current hospital active medication list Current Facility-Administered Medications  Medication Dose Route Frequency Provider Last Rate Last Admin   acetaminophen (TYLENOL) tablet 650 mg  650 mg Oral Q6H PRN Manuella Ghazi, Pratik D, DO   650 mg at 06/07/22 0028   brimonidine (ALPHAGAN) 0.2 % ophthalmic solution 1 drop  1 drop Both Eyes BID Manuella Ghazi, Pratik D, DO   1 drop at 06/03/22 1459   donepezil (ARICEPT) tablet 10 mg  10 mg Oral QHS Manuella Ghazi, Pratik D, DO   10 mg at 06/06/22 2034   finasteride (PROSCAR) tablet 5 mg  5 mg Oral Daily Manuella Ghazi, Pratik D, DO   5 mg at 06/07/22 0825   haloperidol lactate (HALDOL) injection 2 mg  2 mg Intravenous Q8H PRN Barton Dubois, MD        hydrALAZINE (APRESOLINE) injection 10 mg  10 mg Intravenous Q4H PRN Manuella Ghazi, Pratik D, DO   10 mg at 06/05/22 1613   loratadine (CLARITIN) tablet 10 mg  10 mg Oral q AM Manuella Ghazi, Pratik D, DO   10 mg at 06/07/22 0603   metoprolol tartrate (LOPRESSOR) tablet 25 mg  25 mg Oral BID Heath Lark D, DO   25 mg at 06/07/22 0825   pantoprazole (PROTONIX) EC tablet 40 mg  40 mg Oral Daily Manuella Ghazi, Pratik D, DO   40 mg at 06/07/22 0825   sertraline (ZOLOFT) tablet 50 mg  50 mg Oral q morning Manuella Ghazi, Pratik D, DO   50 mg at 06/07/22 8413     Discharge Medications:  STOP taking these medications     Eliquis 2.5 MG Tabs tablet Generic drug: apixaban    nitroGLYCERIN 0.4 MG SL tablet Commonly known as: Nitrostat           TAKE these medications     acetaminophen 650 MG CR tablet Commonly known as: TYLENOL Take 650 mg by mouth 2 (two) times daily.    B-12 1000 MCG Tabs Take 1 tablet (1,000 mcg total) by mouth daily.    brimonidine 0.2 % ophthalmic solution Commonly known as: ALPHAGAN Place 1 drop into both eyes 2 (two) times daily.    donepezil 10 MG tablet Commonly known as: ARICEPT Take 10 mg by mouth at bedtime.    Ensure High Protein Liqd Take 237 mLs by mouth 3 (three) times daily after meals.    finasteride 5 MG tablet Commonly known as: PROSCAR Take 5 mg by mouth daily.    loratadine 10 MG tablet Commonly known as: CLARITIN Take 10 mg by mouth in the morning.    metoprolol tartrate 25 MG tablet Commonly known as: LOPRESSOR Take 1 tablet (25 mg total) by mouth 2 (two) times daily.    midodrine 5 MG tablet Commonly known as: PROAMATINE Take 2 tablets in the morning and one tablet in the evening.    pantoprazole 40 MG tablet Commonly known as: PROTONIX Take 40 mg by mouth daily.    sertraline 50 MG tablet Commonly known as: ZOLOFT Take 50 mg by mouth every morning.    Vitamin D3 25 MCG (1000 UT) Caps Take 1,000 Units by mouth daily.           Relevant Imaging  Results:  Relevant Lab Results:   Additional Information HH PT  Shade Flood, LCSW

## 2022-06-07 NOTE — TOC Transition Note (Signed)
Transition of Care [facility]) - CM/SW Discharge Note   Patient Details  Name: Benjamin Mason MRN: [id number] Date of Birth: 1931/04/07  [facility]) CM/SW Contact:  Shade Flood, LCSW Phone Number: 06/07/2022, 12:36 PM   Clinical Narrative:     Pt stable for dc. [facility] has declined bed offer to patient. Family elects for dc back to [facility] with HHPT. Sandy at [facility] states they can accommodate this plan. [facility] will be providing the [facility] services. Daughter will transport. There are no other TOC needs for dc.  Final next level of care: Assisted Living Barriers to Discharge: Barriers Resolved   Patient Goals and CMS Choice Patient states their goals for this hospitalization and ongoing recovery are:: return to ALF CMS Medicare.gov Compare Post Acute Care list provided to:: Patient Represenative (must comment) Choice offered to / list presented to : Adult Children  Discharge Placement                       Discharge Plan and Services In-house Referral: Clinical Social Work                        HH Arranged: PT [place]: [place] Date [place]: 06/07/22   Representative spoke with at [place]: Judson Roch  Social Determinants of Health ([place]) Interventions     Readmission Risk Interventions     No data to display

## 2022-06-07 NOTE — Progress Notes (Addendum)
Pt attempted to climb out of bed. He seems anxious and a bit agitated. He reports his body is aching from laying down. He c/o  mild headache as well. Haldol and Tylenol administered. This RN and NT Estill Bamberg assisted patient to side of bed. With the assistance of a front wheel walker this RN and NT,assisted patient to  march in place and ambulat in room. He voices that he does not want to lay in bed anymore so, patient was assisted to recliner with chair alarm in place. 1950's music playlist was turned on for patient to enjoy. He is currently sitting in recliner and appears more comfortable at this time. Bryson Corona Edd Fabian

## 2022-06-07 NOTE — Progress Notes (Signed)
Overnight patient had become a bit restless and was exiting bed.Pt alert and oriented to self and month. PRN haldol was administered. He c/o mild headache and Tylenol was given. Puils equal and reactive.He ambulated in room with front wheel walker, ambulated to restroom, and ambulated down the hall to the nurses station. He preferred to sit in recliner most of the night. Chair alarm utilized. Pt reported that his favorite singer is Orland Penman so this RN played a [place] [employer] and patient appeared soothed and smiled often. VS remained stable and patient remained in rate controlled Afib. He took medications without difficulty. No acute events over night. Bryson Corona Edd Fabian

## 2022-06-07 NOTE — Discharge Summary (Signed)
Physician Discharge Summary   Patient: Benjamin Mason MRN: [id number] DOB: April 12, 1931  Admit date:     06/02/2022  Discharge date: 06/07/22  Discharge Physician: Barton Dubois   PCP: Manon Hilding, MD   Recommendations at discharge:  Repeat basic metabolic panel and magnesium level to follow electrolytes and renal function.  (Goal is for potassium above 4 and magnesium above 2). Reassess blood pressure and adjust antihypertensive regimen as needed.  Discharge Diagnoses: Principal Problem:   Subdural hemorrhage ([place]) Active Problems:   GERD (gastroesophageal reflux disease)   CKD (chronic kidney disease), stage III ([place])   Fall at home, initial encounter   Altered mental status   Hypomagnesemia   Macrocytic anemia   Chronic atrial fibrillation with RVR (HCC)   Vascular dementia (HCC)   Depression   BPH (benign prostatic hyperplasia)   Subdural hematoma [facility])  Hospital Course: Benjamin Mason is a 88 y.o. male with medical history significant of vascular dementia, depression, BPH, GERD, SSS s/p pacemaker, A-fib on Eliquis implant who presents emergency department via EMS from nursing home facility due to an unwitnessed fall.  He was noted to have subdural hemorrhage with nondisplaced fracture of the occipital calvarium which has remained stable.  His Eliquis was reversed and he now remains off anticoagulation.  He was also noted to have atrial fibrillation with RVR requiring rate control and was started on IV amiodarone.  Plan is to discontinue this at this point and start metoprolol for rate control per cardiology.  PT recommending SNF, but family interested in going back to ALF. TOC working on placement.  Assessment and Plan:   Subdural hemorrhage s/p post Fall -CT head without contrast showed nondisplaced  fracture of the occipital calvarium. R-epeat head CT with no significant change, neurosurgery to follow-up outpatient in 3/4 weeks; Eliquis has been discontinued at  this point. -Continue avoiding the use of NSAIDs. -Continue fall precaution, supportive care and assistance. -Home health PT arrange at discharge. -Factor Xa recombinant was given to reverse Eliquis at time of admission. -SCDs were used for DVT prophylaxis while inpatient.   Altered mental status possibly secondary to above -Patient with underlying history of dementia -Continue supportive care, assistance and home health services for rehabilitation -Educated about the importance of using walker to provide a stability..  Hypertension-poorly controlled -Overall stable and well-controlled -Continue to follow low-sodium diet -Continue treatment with metoprolol.   Chronic A-fib with RVR with pacemaker -Rate controlled -Continue metoprolol 25 mg twice a day. -Continue patient follow-up with cardiology service -No anticoagulation will be provided given high risk for falls and underlying dementia.   Macrocytic anemia -Continue chronic B12 supplementation -Overall stable levels.   Vascular dementia -Continue the use of Aricept -Continue assistance, supportive care and constant reorientation.   Depression -Stable mood appreciated on exam. -No suicidal ideation or hallucination -Continue home antidepressant regimen.   BPH -No complaints of urinary retention symptoms -Continue the use of Proscar.   GERD -Continue PPI.  Consultants: Cardiology service and neurosurgery. Procedures performed: See below for x-ray reports. Disposition: Assisted living facility with home health services. Diet recommendation: Heart healthy/dysphagia 3 with thin liquids.  DISCHARGE MEDICATION: Allergies as of 06/07/2022   No Known Allergies      Medication List     STOP taking these medications    Eliquis 2.5 MG Tabs tablet Generic drug: apixaban   nitroGLYCERIN 0.4 MG SL tablet Commonly known as: Nitrostat       TAKE these medications  acetaminophen 650 MG CR tablet Commonly  known as: TYLENOL Take 650 mg by mouth 2 (two) times daily.   B-12 1000 MCG Tabs Take 1 tablet (1,000 mcg total) by mouth daily.   brimonidine 0.2 % ophthalmic solution Commonly known as: ALPHAGAN Place 1 drop into both eyes 2 (two) times daily.   donepezil 10 MG tablet Commonly known as: ARICEPT Take 10 mg by mouth at bedtime.   Ensure High Protein Liqd Take 237 mLs by mouth 3 (three) times daily after meals.   finasteride 5 MG tablet Commonly known as: PROSCAR Take 5 mg by mouth daily.   loratadine 10 MG tablet Commonly known as: CLARITIN Take 10 mg by mouth in the morning.   metoprolol tartrate 25 MG tablet Commonly known as: LOPRESSOR Take 1 tablet (25 mg total) by mouth 2 (two) times daily.   midodrine 5 MG tablet Commonly known as: PROAMATINE Take 2 tablets in the morning and one tablet in the evening.   pantoprazole 40 MG tablet Commonly known as: PROTONIX Take 40 mg by mouth daily.   sertraline 50 MG tablet Commonly known as: ZOLOFT Take 50 mg by mouth every morning.   Vitamin D3 25 MCG (1000 UT) Caps Take 1,000 Units by mouth daily.        Follow-up Information     Connect with your PCP/Specialist as discussed. Schedule an appointment as soon as possible for a visit .   Contact information: [email or web address] Call our physician referral line at [phone].        Sasser, Silvestre Moment, MD. Schedule an appointment as soon as possible for a visit in 10 day(s).   Specialty: Family Medicine Contact information: [place] [id number] [phone]                Discharge Exam: Danley Danker Weights   06/03/22 0520 06/04/22 0610  Weight: 71.2 kg 67.6 kg   General exam: Alert, awake, oriented to person and place (last 1 intermittently); in no acute distress.  Denies chest pain, nausea, vomiting or shortness of breath.  Patient expressed no palpitations. Respiratory system: Clear to auscultation. Respiratory effort  normal.  Good oxygen saturation on room air; no using accessory muscle. Cardiovascular system: Rate controlled, irregular rhythm.  No rubs or gallops.  No JVD appreciated on exam. Gastrointestinal system: Abdomen is nondistended, soft and nontender. No organomegaly or masses felt. Normal bowel sounds heard. Central nervous system:No focal neurological deficits. Extremities: No cyanosis, clubbing or edema. Skin: No petechiae.  Multiple diffuse bruises appreciated in his upper extremities. Psychiatry: Judgement and insight appear impaired secondary to dementia.   Condition at discharge: Stable and improved.  The results of significant diagnostics from this hospitalization (including imaging, microbiology, ancillary and laboratory) are listed below for reference.   Imaging Studies: CT HEAD WO CONTRAST (5MM)  Result Date: 06/04/2022 CLINICAL DATA:  Follow-up subdural hematoma EXAM: CT HEAD WITHOUT CONTRAST TECHNIQUE: Contiguous axial images were obtained from the base of the skull through the vertex without intravenous contrast. RADIATION DOSE REDUCTION: This exam was performed according to the departmental dose-optimization program which includes automated exposure control, adjustment of the mA and/or kV according to patient size and/or use of iterative reconstruction technique. COMPARISON:  Yesterday FINDINGS: Brain: Unchanged left more than right inferior temporal cerebral contusion without significant mass effect. No high-density subdural clot seen today. Question low-density subdural collections around both cerebral convexities measuring up to 3 mm in thickness anteriorly, which would be stable. No infarct, hydrocephalus, or  mass. Chronic small vessel ischemia in the cerebral white matter Vascular: No hyperdense vessel or unexpected calcification. Skull: Known nondisplaced occipital bone fracture. Sinuses/Orbits: No acute finding. IMPRESSION: No progression of small inferior temporal contusions.  High-density subdural and subarachnoid blood clot has resolved. Electronically Signed   By: Jorje Guild M.D.   On: 06/04/2022 09:47   CT HEAD WO CONTRAST (5MM)  Result Date: 06/03/2022 CLINICAL DATA:  Follow-up subdural hemorrhage EXAM: CT HEAD WITHOUT CONTRAST TECHNIQUE: Contiguous axial images were obtained from the base of the skull through the vertex without intravenous contrast. RADIATION DOSE REDUCTION: This exam was performed according to the departmental dose-optimization program which includes automated exposure control, adjustment of the mA and/or kV according to patient size and/or use of iterative reconstruction technique. COMPARISON:  Yesterday FINDINGS: Brain: Blooming hemorrhagic contusion in the inferior left temporal lobe, hemorrhagic area measuring 11 mm. Milder right inferior temporal hemorrhagic contusion is also suspected. Unchanged trace subdural hemorrhage in the right parafalcine and tentorial convexities. Trace subarachnoid hemorrhage at the right paramedian vertex, see 4: 43. No cerebral mass effect. No evidence of acute infarct or hydrocephalus. Chronic small vessel ischemia. Vascular: No hyperdense vessel or unexpected calcification. Skull: Left occipital skull fracture, nondepressed. Sinuses/Orbits: No acute finding IMPRESSION: 1. Expected blooming of hemorrhagic contusion in the inferior left temporal lobe, hemorrhage measuring 11 mm. Milder hemorrhagic contusion likely also present in the inferior right temporal lobe. 2. Unchanged trace subarachnoid and subdural hemorrhage. No cerebral mass effect. Electronically Signed   By: Jorje Guild M.D.   On: 06/03/2022 10:45   CT HEAD WO CONTRAST (5MM)  Result Date: 06/02/2022 CLINICAL DATA:  Trauma. EXAM: CT HEAD WITHOUT CONTRAST CT CERVICAL SPINE WITHOUT CONTRAST TECHNIQUE: Multidetector CT imaging of the head and cervical spine was performed following the standard protocol without intravenous contrast. Multiplanar CT image  reconstructions of the cervical spine were also generated. RADIATION DOSE REDUCTION: This exam was performed according to the departmental dose-optimization program which includes automated exposure control, adjustment of the mA and/or kV according to patient size and/or use of iterative reconstruction technique. COMPARISON:  Head CT dated 12/21/2021. FINDINGS: CT HEAD FINDINGS Brain: Moderate age-related atrophy and chronic microvascular ischemic changes. Small right posterior parafalcine subdural hemorrhage measures 3 mm in thickness. Additional small subdural hemorrhage noted along the right tentorium measuring 6 mm in thickness. No mass effect midline shift. Vascular: No hyperdense vessel or unexpected calcification. Skull: Nondisplaced fracture of the occipital calvarium. Sinuses/Orbits: No acute finding. Other: None CT CERVICAL SPINE FINDINGS Alignment: No acute subluxation. Skull base and vertebrae: No acute fracture. Soft tissues and spinal canal: No prevertebral fluid or swelling. No visible canal hematoma. Disc levels:  No acute findings.  Multilevel degenerative changes. Upper chest: Negative. Other: Bilateral carotid bulb calcified plaques. IMPRESSION: 1. Nondisplaced fracture of the occipital calvarium. 2. Small right posterior parafalcine and right supratentorial subdural hemorrhage. No mass effect or midline shift. 3. Moderate age-related atrophy and chronic microvascular ischemic changes. 4. No acute/traumatic cervical spine pathology. These results were called by telephone at the time of interpretation on 06/02/2022 at 7:10 pm to provider [facility] , who verbally acknowledged these results. Electronically Signed   By: Anner Crete M.D.   On: 06/02/2022 19:16   CT CERVICAL SPINE WO CONTRAST  Result Date: 06/02/2022 CLINICAL DATA:  Trauma. EXAM: CT HEAD WITHOUT CONTRAST CT CERVICAL SPINE WITHOUT CONTRAST TECHNIQUE: Multidetector CT imaging of the head and cervical spine was performed  following the standard protocol without intravenous contrast. Multiplanar  CT image reconstructions of the cervical spine were also generated. RADIATION DOSE REDUCTION: This exam was performed according to the departmental dose-optimization program which includes automated exposure control, adjustment of the mA and/or kV according to patient size and/or use of iterative reconstruction technique. COMPARISON:  Head CT dated 12/21/2021. FINDINGS: CT HEAD FINDINGS Brain: Moderate age-related atrophy and chronic microvascular ischemic changes. Small right posterior parafalcine subdural hemorrhage measures 3 mm in thickness. Additional small subdural hemorrhage noted along the right tentorium measuring 6 mm in thickness. No mass effect midline shift. Vascular: No hyperdense vessel or unexpected calcification. Skull: Nondisplaced fracture of the occipital calvarium. Sinuses/Orbits: No acute finding. Other: None CT CERVICAL SPINE FINDINGS Alignment: No acute subluxation. Skull base and vertebrae: No acute fracture. Soft tissues and spinal canal: No prevertebral fluid or swelling. No visible canal hematoma. Disc levels:  No acute findings.  Multilevel degenerative changes. Upper chest: Negative. Other: Bilateral carotid bulb calcified plaques. IMPRESSION: 1. Nondisplaced fracture of the occipital calvarium. 2. Small right posterior parafalcine and right supratentorial subdural hemorrhage. No mass effect or midline shift. 3. Moderate age-related atrophy and chronic microvascular ischemic changes. 4. No acute/traumatic cervical spine pathology. These results were called by telephone at the time of interpretation on 06/02/2022 at 7:10 pm to provider [facility] , who verbally acknowledged these results. Electronically Signed   By: Anner Crete M.D.   On: 06/02/2022 19:16    Microbiology: Results for orders placed or performed during the hospital encounter of 06/02/22  MRSA Next Gen by PCR, Nasal     Status: None    Collection Time: 06/03/22  1:46 AM   Specimen: Nasal Mucosa; Nasal Swab  Result Value Ref Range Status   MRSA by PCR Next Gen NOT DETECTED NOT DETECTED Final    Comment: (NOTE) The GeneXpert MRSA Assay (FDA approved for NASAL specimens only), is one component of a comprehensive MRSA colonization surveillance program. It is not intended to diagnose MRSA infection nor to guide or monitor treatment for MRSA infections. Test performance is not FDA approved in patients less than 52 years old. Performed at [facility], [address], [place], [place] [address]     Labs: CBC: Recent Labs  Lab 06/02/22 2145 06/03/22 0336 06/04/22 0414 06/07/22 0529  WBC 6.0 7.2 7.1 6.5  NEUTROABS 5.2  --   --   --   HGB 9.3* 9.8* 10.6* 10.3*  HCT 28.9* 30.6* 32.5* 31.7*  MCV 107.0* 107.4* 105.9* 106.4*  PLT 167 170 143* 798   Basic Metabolic Panel: Recent Labs  Lab 06/02/22 2145 06/03/22 0336 06/04/22 0414 06/07/22 0529  NA 136 138 136 136  K 4.1 4.0 3.7 3.9  CL 106 106 103 103  CO2 '23 23 23 23  '$ GLUCOSE 164* 143* 98 97  BUN 39* 34* 28* 43*  CREATININE 1.86* 1.64* 1.53* 1.95*  CALCIUM 8.7* 9.3 9.4 9.2  MG 1.6* 2.1 1.8 1.9  PHOS  --  2.5  --   --    Liver Function Tests: Recent Labs  Lab 06/03/22 0336  AST 41  ALT 24  ALKPHOS 61  BILITOT 0.7  PROT 6.8  ALBUMIN 3.7    Discharge time spent: greater than 30 minutes.  Signed: Barton Dubois, MD [facility] 06/07/2022

## 2022-06-08 DIAGNOSIS — I482 Chronic atrial fibrillation, unspecified: Secondary | ICD-10-CM | POA: Diagnosis not present

## 2022-06-08 DIAGNOSIS — I6200 Nontraumatic subdural hemorrhage, unspecified: Secondary | ICD-10-CM | POA: Diagnosis not present

## 2022-06-08 DIAGNOSIS — G309 Alzheimer's disease, unspecified: Secondary | ICD-10-CM | POA: Diagnosis not present

## 2022-06-08 DIAGNOSIS — S02119A Unspecified fracture of occiput, initial encounter for closed fracture: Secondary | ICD-10-CM | POA: Diagnosis not present

## 2022-06-09 ENCOUNTER — Telehealth: Payer: Self-pay | Admitting: Cardiology

## 2022-06-09 DIAGNOSIS — S065X0A Traumatic subdural hemorrhage without loss of consciousness, initial encounter: Secondary | ICD-10-CM | POA: Diagnosis not present

## 2022-06-09 DIAGNOSIS — R296 Repeated falls: Secondary | ICD-10-CM | POA: Diagnosis not present

## 2022-06-09 LAB — GLUCOSE, CAPILLARY: Glucose-Capillary: 96 mg/dL (ref 70–99)

## 2022-06-09 NOTE — Telephone Encounter (Signed)
Patient daughter called wanting to schedule a hospital f/up appt with Dr. Domenic Polite. She said Dr. Harl Bowie wanted him to follow up with Dr. Domenic Polite, she did not want to schedule him with anyone else.  She states her father is very weak.  He has an appt already scheduled for 12/6 with Dr. Domenic Polite, she wants to know if her father can wait until then to be seen or does he need to be seen sooner.

## 2022-06-10 DIAGNOSIS — I6200 Nontraumatic subdural hemorrhage, unspecified: Secondary | ICD-10-CM | POA: Diagnosis not present

## 2022-06-10 DIAGNOSIS — D539 Nutritional anemia, unspecified: Secondary | ICD-10-CM | POA: Diagnosis not present

## 2022-06-10 NOTE — Telephone Encounter (Signed)
lmtcb

## 2022-06-10 NOTE — Telephone Encounter (Signed)
Daughter states that patient was was recently hospitalized at [facility] for a fall at [place] that led to a brain bleed. States that there were changes made to his medications and she would like for Dr. Domenic Polite to review the hospital notes to see if he agrees with the changes that have been made. Patient is due to see Dr. Domenic Polite 12/6 but wants to know if he needs to be seen sooner.

## 2022-06-13 NOTE — Telephone Encounter (Signed)
Daughter made aware. Appoint scheduled to see Dr. Domenic Polite in [place] on Monday 11/20 @ 1:00 pm. States that she is noticing that since the fall, patient has been drooling a lot and she has concerns about a possible stroke. Please advise.

## 2022-06-14 ENCOUNTER — Telehealth: Payer: Self-pay | Admitting: Anesthesiology

## 2022-06-14 DIAGNOSIS — G309 Alzheimer's disease, unspecified: Secondary | ICD-10-CM | POA: Diagnosis not present

## 2022-06-14 DIAGNOSIS — S02119A Unspecified fracture of occiput, initial encounter for closed fracture: Secondary | ICD-10-CM | POA: Diagnosis not present

## 2022-06-14 DIAGNOSIS — I6200 Nontraumatic subdural hemorrhage, unspecified: Secondary | ICD-10-CM | POA: Diagnosis not present

## 2022-06-14 DIAGNOSIS — I482 Chronic atrial fibrillation, unspecified: Secondary | ICD-10-CM | POA: Diagnosis not present

## 2022-06-14 NOTE — Telephone Encounter (Signed)
Daughter made aware. States that one of the nurses at the facility mentioned the drooling to her but states that she is going to have the nurse speak with his physician at the facility to discuss possible imaging.

## 2022-06-15 ENCOUNTER — Ambulatory Visit: Payer: Medicare Other | Admitting: Physician Assistant

## 2022-06-16 DIAGNOSIS — F0154 Vascular dementia, unspecified severity, with anxiety: Secondary | ICD-10-CM | POA: Diagnosis not present

## 2022-06-16 DIAGNOSIS — F0153 Vascular dementia, unspecified severity, with mood disturbance: Secondary | ICD-10-CM | POA: Diagnosis not present

## 2022-06-16 DIAGNOSIS — S02119D Unspecified fracture of occiput, subsequent encounter for fracture with routine healing: Secondary | ICD-10-CM | POA: Diagnosis not present

## 2022-06-16 DIAGNOSIS — I129 Hypertensive chronic kidney disease with stage 1 through stage 4 chronic kidney disease, or unspecified chronic kidney disease: Secondary | ICD-10-CM | POA: Diagnosis not present

## 2022-06-16 DIAGNOSIS — S065X0D Traumatic subdural hemorrhage without loss of consciousness, subsequent encounter: Secondary | ICD-10-CM | POA: Diagnosis not present

## 2022-06-16 DIAGNOSIS — I4819 Other persistent atrial fibrillation: Secondary | ICD-10-CM | POA: Diagnosis not present

## 2022-06-20 DIAGNOSIS — I129 Hypertensive chronic kidney disease with stage 1 through stage 4 chronic kidney disease, or unspecified chronic kidney disease: Secondary | ICD-10-CM | POA: Diagnosis not present

## 2022-06-20 DIAGNOSIS — F0153 Vascular dementia, unspecified severity, with mood disturbance: Secondary | ICD-10-CM | POA: Diagnosis not present

## 2022-06-20 DIAGNOSIS — S065X0D Traumatic subdural hemorrhage without loss of consciousness, subsequent encounter: Secondary | ICD-10-CM | POA: Diagnosis not present

## 2022-06-20 DIAGNOSIS — I4819 Other persistent atrial fibrillation: Secondary | ICD-10-CM | POA: Diagnosis not present

## 2022-06-20 DIAGNOSIS — S02119D Unspecified fracture of occiput, subsequent encounter for fracture with routine healing: Secondary | ICD-10-CM | POA: Diagnosis not present

## 2022-06-20 DIAGNOSIS — F0154 Vascular dementia, unspecified severity, with anxiety: Secondary | ICD-10-CM | POA: Diagnosis not present

## 2022-06-21 DIAGNOSIS — H401133 Primary open-angle glaucoma, bilateral, severe stage: Secondary | ICD-10-CM | POA: Diagnosis not present

## 2022-06-22 DIAGNOSIS — F0154 Vascular dementia, unspecified severity, with anxiety: Secondary | ICD-10-CM | POA: Diagnosis not present

## 2022-06-22 DIAGNOSIS — I4819 Other persistent atrial fibrillation: Secondary | ICD-10-CM | POA: Diagnosis not present

## 2022-06-22 DIAGNOSIS — S02119D Unspecified fracture of occiput, subsequent encounter for fracture with routine healing: Secondary | ICD-10-CM | POA: Diagnosis not present

## 2022-06-22 DIAGNOSIS — F0153 Vascular dementia, unspecified severity, with mood disturbance: Secondary | ICD-10-CM | POA: Diagnosis not present

## 2022-06-22 DIAGNOSIS — I129 Hypertensive chronic kidney disease with stage 1 through stage 4 chronic kidney disease, or unspecified chronic kidney disease: Secondary | ICD-10-CM | POA: Diagnosis not present

## 2022-06-22 DIAGNOSIS — S065X0D Traumatic subdural hemorrhage without loss of consciousness, subsequent encounter: Secondary | ICD-10-CM | POA: Diagnosis not present

## 2022-06-28 DIAGNOSIS — K580 Irritable bowel syndrome with diarrhea: Secondary | ICD-10-CM | POA: Diagnosis not present

## 2022-06-28 DIAGNOSIS — I480 Paroxysmal atrial fibrillation: Secondary | ICD-10-CM | POA: Diagnosis not present

## 2022-06-28 DIAGNOSIS — E782 Mixed hyperlipidemia: Secondary | ICD-10-CM | POA: Diagnosis not present

## 2022-06-28 DIAGNOSIS — I10 Essential (primary) hypertension: Secondary | ICD-10-CM | POA: Diagnosis not present

## 2022-06-28 DIAGNOSIS — I4819 Other persistent atrial fibrillation: Secondary | ICD-10-CM | POA: Diagnosis not present

## 2022-06-28 DIAGNOSIS — N183 Chronic kidney disease, stage 3 unspecified: Secondary | ICD-10-CM | POA: Diagnosis not present

## 2022-06-28 DIAGNOSIS — I129 Hypertensive chronic kidney disease with stage 1 through stage 4 chronic kidney disease, or unspecified chronic kidney disease: Secondary | ICD-10-CM | POA: Diagnosis not present

## 2022-06-28 DIAGNOSIS — K2100 Gastro-esophageal reflux disease with esophagitis, without bleeding: Secondary | ICD-10-CM | POA: Diagnosis not present

## 2022-06-28 DIAGNOSIS — S02119D Unspecified fracture of occiput, subsequent encounter for fracture with routine healing: Secondary | ICD-10-CM | POA: Diagnosis not present

## 2022-06-28 DIAGNOSIS — I25811 Atherosclerosis of native coronary artery of transplanted heart without angina pectoris: Secondary | ICD-10-CM | POA: Diagnosis not present

## 2022-06-28 DIAGNOSIS — F0153 Vascular dementia, unspecified severity, with mood disturbance: Secondary | ICD-10-CM | POA: Diagnosis not present

## 2022-06-28 DIAGNOSIS — S065X0D Traumatic subdural hemorrhage without loss of consciousness, subsequent encounter: Secondary | ICD-10-CM | POA: Diagnosis not present

## 2022-06-28 DIAGNOSIS — F0154 Vascular dementia, unspecified severity, with anxiety: Secondary | ICD-10-CM | POA: Diagnosis not present

## 2022-06-29 DIAGNOSIS — F0153 Vascular dementia, unspecified severity, with mood disturbance: Secondary | ICD-10-CM | POA: Diagnosis not present

## 2022-06-29 DIAGNOSIS — I4819 Other persistent atrial fibrillation: Secondary | ICD-10-CM | POA: Diagnosis not present

## 2022-06-29 DIAGNOSIS — S065X0D Traumatic subdural hemorrhage without loss of consciousness, subsequent encounter: Secondary | ICD-10-CM | POA: Diagnosis not present

## 2022-06-29 DIAGNOSIS — I129 Hypertensive chronic kidney disease with stage 1 through stage 4 chronic kidney disease, or unspecified chronic kidney disease: Secondary | ICD-10-CM | POA: Diagnosis not present

## 2022-06-29 DIAGNOSIS — S02119D Unspecified fracture of occiput, subsequent encounter for fracture with routine healing: Secondary | ICD-10-CM | POA: Diagnosis not present

## 2022-06-29 DIAGNOSIS — F0154 Vascular dementia, unspecified severity, with anxiety: Secondary | ICD-10-CM | POA: Diagnosis not present

## 2022-06-30 ENCOUNTER — Ambulatory Visit (INDEPENDENT_AMBULATORY_CARE_PROVIDER_SITE_OTHER): Payer: Medicare Other

## 2022-06-30 DIAGNOSIS — I495 Sick sinus syndrome: Secondary | ICD-10-CM | POA: Diagnosis not present

## 2022-07-01 LAB — CUP PACEART REMOTE DEVICE CHECK
Battery Remaining Longevity: 112 mo
Battery Remaining Percentage: 84 %
Battery Voltage: 3.02 V
Brady Statistic RV Percent Paced: 1 %
Date Time Interrogation Session: 20231116030014
Implantable Lead Connection Status: 753985
Implantable Lead Implant Date: 20210517
Implantable Lead Location: 753860
Implantable Pulse Generator Implant Date: 20210517
Lead Channel Impedance Value: 490 Ohm
Lead Channel Pacing Threshold Amplitude: 0.5 V
Lead Channel Pacing Threshold Pulse Width: 0.5 ms
Lead Channel Sensing Intrinsic Amplitude: 4 mV
Lead Channel Setting Pacing Amplitude: 2.5 V
Lead Channel Setting Pacing Pulse Width: 0.5 ms
Lead Channel Setting Sensing Sensitivity: 0.7 mV
Pulse Gen Model: 1272
Pulse Gen Serial Number: 3800831

## 2022-07-04 ENCOUNTER — Ambulatory Visit: Payer: Medicare Other | Attending: Cardiology | Admitting: Cardiology

## 2022-07-04 ENCOUNTER — Encounter: Payer: Self-pay | Admitting: Cardiology

## 2022-07-04 VITALS — BP 118/68 | HR 59 | Ht 72.0 in | Wt 148.6 lb

## 2022-07-04 DIAGNOSIS — I495 Sick sinus syndrome: Secondary | ICD-10-CM

## 2022-07-04 DIAGNOSIS — I951 Orthostatic hypotension: Secondary | ICD-10-CM

## 2022-07-04 DIAGNOSIS — N1832 Chronic kidney disease, stage 3b: Secondary | ICD-10-CM | POA: Diagnosis not present

## 2022-07-04 DIAGNOSIS — I4821 Permanent atrial fibrillation: Secondary | ICD-10-CM | POA: Diagnosis not present

## 2022-07-04 NOTE — Progress Notes (Signed)
Cardiology Office Note  Date: 07/04/2022   ID: Benjamin Mason, DOB Nov 18, 1930, MRN [id number]  PCP:  Manon Hilding, MD  Cardiologist:  Rozann Lesches, MD Electrophysiologist:  None   Chief Complaint  Patient presents with   Cardiac follow-up    History of Present Illness: Benjamin Mason is an 88 y.o. male last seen in August.  He is here with his daughter for a follow-up visit.  Has had no subsequent falls since last hospital stay at which point he was taken off Eliquis in the setting of intracranial hemorrhage.  He recovered from this with conservative measures.  Still lives in [place], using a cane to ambulate.  St. Jude pacemaker in place with follow-up by EP.  Last device interrogation showed normal function with 4 episodes of atrial fibrillation with RVR.  He did have a follow-up head CT in late October showing no progression in small inferior temporal contusions with resolution of subdural and subarachnoid blood clot.  I reviewed recorded blood pressures from [place], all of these look quite stable.  No hypotension recorded.  He remains on midodrine along with Lopressor.  Wife just recently passed away following a stroke.  Past Medical History:  Diagnosis Date   Anxiety    Aortic regurgitation    Mild to moderate   CKD (chronic kidney disease) stage 3, GFR 30-59 ml/min (HCC)    Coronary atherosclerosis of native coronary artery    BMS to RCA and LAD 1995   Essential hypertension    Frequent PVCs    IBS (irritable bowel syndrome)    Lactose intolerance    Mitral regurgitation    Moderate   Mixed hyperlipidemia    Permanent atrial fibrillation [facility])    Sick sinus syndrome [facility])    St. Jude single-chamber pacemaker May 2021 - Dr. Caryl Comes   Skin cancer    Skull fracture [facility])     Past Surgical History:  Procedure Laterality Date   BALLOON DILATION  08/03/2012   Procedure: BALLOON DILATION;  Surgeon: Rogene Houston, MD;  [facility];   Service: Endoscopy;  Laterality: N/A;   COLONOSCOPY WITH ESOPHAGOGASTRODUODENOSCOPY (EGD)  08/03/2012   Procedure: COLONOSCOPY WITH ESOPHAGOGASTRODUODENOSCOPY (EGD);  Surgeon: Rogene Houston, MD;  [facility];  Service: Endoscopy;  Laterality: N/A;  215   LEFT HEART CATH AND CORONARY ANGIOGRAPHY N/A 11/18/2016   Procedure: Left Heart Cath and Coronary Angiography;  Surgeon: Peter M Martinique, MD;  Location: [place] CV LAB;  Service: Cardiovascular;  Laterality: N/A;   LEFT INGUINAL HERNIA     MALONEY DILATION  08/03/2012   Procedure: MALONEY DILATION;  Surgeon: Rogene Houston, MD;  [facility];  Service: Endoscopy;  Laterality: N/A;   PACEMAKER IMPLANT N/A 12/30/2019   Procedure: PACEMAKER IMPLANT;  Surgeon: Deboraha Sprang, MD;  Location: [place] CV LAB;  Service: Cardiovascular;  Laterality: N/A;   PENILE PROSTHESIS IMPLANT     SAVORY DILATION  08/03/2012   Procedure: SAVORY DILATION;  Surgeon: Rogene Houston, MD;  [facility];  Service: Endoscopy;  Laterality: N/A;    Current Outpatient Medications  Medication Sig Dispense Refill   acetaminophen (TYLENOL) 650 MG CR tablet Take 650 mg by mouth 2 (two) times daily.     brimonidine (ALPHAGAN) 0.2 % ophthalmic solution Place 1 drop into both eyes 2 (two) times daily.     Cholecalciferol (VITAMIN D3) 25 MCG (1000 UT) CAPS Take 1,000 Units by mouth  daily.     Cyanocobalamin (B-12) 1000 MCG TABS Take 1 tablet (1,000 mcg total) by mouth daily. 30 tablet 11   donepezil (ARICEPT) 10 MG tablet Take 10 mg by mouth at bedtime.     finasteride (PROSCAR) 5 MG tablet Take 5 mg by mouth daily.       loratadine (CLARITIN) 10 MG tablet Take 10 mg by mouth in the morning.     metoprolol tartrate (LOPRESSOR) 25 MG tablet Take 1 tablet (25 mg total) by mouth 2 (two) times daily. 60 tablet 1   midodrine (PROAMATINE) 5 MG tablet Take 2 tablets in the morning and one tablet in the evening. 270 tablet 1   Nutritional  Supplements (ENSURE HIGH PROTEIN) LIQD Take 237 mLs by mouth 3 (three) times daily after meals.     pantoprazole (PROTONIX) 40 MG tablet Take 40 mg by mouth daily.      sertraline (ZOLOFT) 50 MG tablet Take 50 mg by mouth every morning.     No current facility-administered medications for this visit.   Allergies:  Patient has no known allergies.   ROS: No palpitations.  Physical Exam: VS:  BP 118/68   Pulse (!) 59   Ht 6' (1.829 m)   Wt 148 lb 9.6 oz (67.4 kg)   SpO2 95%   BMI 20.15 kg/m , BMI Body mass index is 20.15 kg/m.  Wt Readings from Last 3 Encounters:  07/04/22 148 lb 9.6 oz (67.4 kg)  06/04/22 149 lb 0.5 oz (67.6 kg)  04/19/22 157 lb (71.2 kg)    General: Patient appears comfortable at rest. HEENT: Conjunctiva and lids normal. Lungs: Clear to auscultation, nonlabored breathing at rest. Cardiac: Regular rate and rhythm, no S3, 2/6 systolic murmur. Extremities: No pitting edema.  ECG:  An ECG dated 06/02/2022 was personally reviewed today and demonstrated:  Atrial fibrillation with RVR, low voltage, intermittent PVCs versus aberrantly conducted complexes.  Recent Labwork: 04/19/2022: TSH 1.69 06/03/2022: ALT 24; AST 41 06/07/2022: BUN 43; Creatinine, Ser 1.95; Hemoglobin 10.3; Magnesium 1.9; Platelets 178; Potassium 3.9; Sodium 136   Other Studies Reviewed Today:  Echocardiogram 07/09/2019:  1. Left ventricular ejection fraction, by visual estimation, is 55 to  60%. The left ventricle has normal function. There is no left ventricular  hypertrophy.   2. Left ventricular diastolic parameters are indeterminate.   3. Global right ventricle has normal systolic function.The right  ventricular size is mildly enlarged. No increase in right ventricular wall  thickness.   4. Left atrial size was moderately dilated.   5. Right atrial size was moderately dilated.   6. Mild mitral annular calcification.   7. Mild to moderate aortic valve annular calcification.   8. The  mitral valve is degenerative. Mild mitral valve regurgitation.   9. The tricuspid valve is grossly normal. Tricuspid valve regurgitation  is mild.  10. The aortic valve is tricuspid. Aortic valve regurgitation is mild.  Mild aortic valve sclerosis without stenosis.  11. The pulmonic valve was grossly normal. Pulmonic valve regurgitation is  mild.  12. Normal pulmonary artery systolic pressure.  13. The tricuspid regurgitant velocity is 2.23 m/s, and with an assumed  right atrial pressure of 8 mmHg, the estimated right ventricular systolic  pressure is normal at 27.9 mmHg.  14. The inferior vena cava is normal in size with <50% respiratory  variability, suggesting right atrial pressure of 8 mmHg.   Assessment and Plan:  1.  Permanent atrial fibrillation with CHA2DS2-VASc score of 4.  He is now off Eliquis given interval fall with traumatic intracranial hemorrhage.  Not optimal candidate for Watchman device, I discussed this with his daughter today.  Plan to continue Lopressor for now and observation.  He is not aware of any sense of palpitations.  2.  Orthostatic hypotension, symptomatically stable at this time on midodrine.  I reviewed his outpatient blood pressure measurements.  3.  Sinus node dysfunction with St. Jude pacemaker in place.  Vies check indicated normal function.  4.  Progressive dementia.  5.  CKD stage IIIb, creatinine 1.95.  Medication Adjustments/Labs and Tests Ordered: Current medicines are reviewed at length with the patient today.  Concerns regarding medicines are outlined above.   Tests Ordered: No orders of the defined types were placed in this encounter.   Medication Changes: No orders of the defined types were placed in this encounter.   Disposition:  Follow up  6 months.  Signed, Satira Sark, MD, [facility] 07/04/2022 1:41 PM    [place] [facility] [address] [place], [place] [address] Phone: [phone]; Fax:  [phone]

## 2022-07-04 NOTE — Patient Instructions (Signed)
Medication Instructions:  Your physician recommends that you continue on your current medications as directed. Please refer to the Current Medication list given to you today.   Labwork: None  Testing/Procedures: None  Follow-Up: Follow up with Dr. McDowell in 6 months.   Any Other Special Instructions Will Be Listed Below (If Applicable).     If you need a refill on your cardiac medications before your next appointment, please call your pharmacy.  

## 2022-07-05 DIAGNOSIS — I129 Hypertensive chronic kidney disease with stage 1 through stage 4 chronic kidney disease, or unspecified chronic kidney disease: Secondary | ICD-10-CM | POA: Diagnosis not present

## 2022-07-05 DIAGNOSIS — F0154 Vascular dementia, unspecified severity, with anxiety: Secondary | ICD-10-CM | POA: Diagnosis not present

## 2022-07-05 DIAGNOSIS — S02119D Unspecified fracture of occiput, subsequent encounter for fracture with routine healing: Secondary | ICD-10-CM | POA: Diagnosis not present

## 2022-07-05 DIAGNOSIS — F0153 Vascular dementia, unspecified severity, with mood disturbance: Secondary | ICD-10-CM | POA: Diagnosis not present

## 2022-07-05 DIAGNOSIS — I4819 Other persistent atrial fibrillation: Secondary | ICD-10-CM | POA: Diagnosis not present

## 2022-07-05 DIAGNOSIS — S065X0D Traumatic subdural hemorrhage without loss of consciousness, subsequent encounter: Secondary | ICD-10-CM | POA: Diagnosis not present

## 2022-07-06 ENCOUNTER — Other Ambulatory Visit: Payer: Self-pay

## 2022-07-06 MED ORDER — METOPROLOL TARTRATE 25 MG PO TABS: 25.0000 mg | ORAL_TABLET | Freq: Two times a day (BID) | ORAL | 11 refills | Status: DC

## 2022-07-08 ENCOUNTER — Other Ambulatory Visit: Payer: Self-pay | Admitting: Cardiology

## 2022-07-12 DIAGNOSIS — I129 Hypertensive chronic kidney disease with stage 1 through stage 4 chronic kidney disease, or unspecified chronic kidney disease: Secondary | ICD-10-CM | POA: Diagnosis not present

## 2022-07-12 DIAGNOSIS — I4819 Other persistent atrial fibrillation: Secondary | ICD-10-CM | POA: Diagnosis not present

## 2022-07-12 DIAGNOSIS — F0154 Vascular dementia, unspecified severity, with anxiety: Secondary | ICD-10-CM | POA: Diagnosis not present

## 2022-07-12 DIAGNOSIS — S065X0D Traumatic subdural hemorrhage without loss of consciousness, subsequent encounter: Secondary | ICD-10-CM | POA: Diagnosis not present

## 2022-07-12 DIAGNOSIS — S02119D Unspecified fracture of occiput, subsequent encounter for fracture with routine healing: Secondary | ICD-10-CM | POA: Diagnosis not present

## 2022-07-12 DIAGNOSIS — F0153 Vascular dementia, unspecified severity, with mood disturbance: Secondary | ICD-10-CM | POA: Diagnosis not present

## 2022-07-14 DIAGNOSIS — I4819 Other persistent atrial fibrillation: Secondary | ICD-10-CM | POA: Diagnosis not present

## 2022-07-14 DIAGNOSIS — D539 Nutritional anemia, unspecified: Secondary | ICD-10-CM | POA: Diagnosis not present

## 2022-07-19 DIAGNOSIS — S065X0D Traumatic subdural hemorrhage without loss of consciousness, subsequent encounter: Secondary | ICD-10-CM | POA: Diagnosis not present

## 2022-07-19 DIAGNOSIS — F0153 Vascular dementia, unspecified severity, with mood disturbance: Secondary | ICD-10-CM | POA: Diagnosis not present

## 2022-07-19 DIAGNOSIS — F0154 Vascular dementia, unspecified severity, with anxiety: Secondary | ICD-10-CM | POA: Diagnosis not present

## 2022-07-19 DIAGNOSIS — B351 Tinea unguium: Secondary | ICD-10-CM | POA: Diagnosis not present

## 2022-07-19 DIAGNOSIS — I129 Hypertensive chronic kidney disease with stage 1 through stage 4 chronic kidney disease, or unspecified chronic kidney disease: Secondary | ICD-10-CM | POA: Diagnosis not present

## 2022-07-19 DIAGNOSIS — I4819 Other persistent atrial fibrillation: Secondary | ICD-10-CM | POA: Diagnosis not present

## 2022-07-19 DIAGNOSIS — S02119D Unspecified fracture of occiput, subsequent encounter for fracture with routine healing: Secondary | ICD-10-CM | POA: Diagnosis not present

## 2022-07-19 DIAGNOSIS — M79674 Pain in right toe(s): Secondary | ICD-10-CM | POA: Diagnosis not present

## 2022-07-20 ENCOUNTER — Ambulatory Visit: Payer: Medicare Other | Admitting: Cardiology

## 2022-07-20 DIAGNOSIS — S065X0D Traumatic subdural hemorrhage without loss of consciousness, subsequent encounter: Secondary | ICD-10-CM | POA: Diagnosis not present

## 2022-07-20 DIAGNOSIS — S02119D Unspecified fracture of occiput, subsequent encounter for fracture with routine healing: Secondary | ICD-10-CM | POA: Diagnosis not present

## 2022-07-20 DIAGNOSIS — I4819 Other persistent atrial fibrillation: Secondary | ICD-10-CM | POA: Diagnosis not present

## 2022-07-20 NOTE — Progress Notes (Signed)
Remote pacemaker transmission.   

## 2022-07-26 ENCOUNTER — Encounter: Payer: Self-pay | Admitting: Physician Assistant

## 2022-07-26 ENCOUNTER — Ambulatory Visit: Payer: Medicare Other | Admitting: Physician Assistant

## 2022-07-26 VITALS — BP 113/69 | HR 87 | Resp 18 | Ht 72.0 in | Wt 147.0 lb

## 2022-07-26 DIAGNOSIS — F02818 Dementia in other diseases classified elsewhere, unspecified severity, with other behavioral disturbance: Secondary | ICD-10-CM

## 2022-07-26 DIAGNOSIS — F01518 Vascular dementia, unspecified severity, with other behavioral disturbance: Secondary | ICD-10-CM | POA: Diagnosis not present

## 2022-07-26 DIAGNOSIS — R296 Repeated falls: Secondary | ICD-10-CM | POA: Diagnosis not present

## 2022-07-26 DIAGNOSIS — I25811 Atherosclerosis of native coronary artery of transplanted heart without angina pectoris: Secondary | ICD-10-CM | POA: Diagnosis not present

## 2022-07-26 DIAGNOSIS — K580 Irritable bowel syndrome with diarrhea: Secondary | ICD-10-CM | POA: Diagnosis not present

## 2022-07-26 DIAGNOSIS — N183 Chronic kidney disease, stage 3 unspecified: Secondary | ICD-10-CM | POA: Diagnosis not present

## 2022-07-26 DIAGNOSIS — I10 Essential (primary) hypertension: Secondary | ICD-10-CM | POA: Diagnosis not present

## 2022-07-26 DIAGNOSIS — K2100 Gastro-esophageal reflux disease with esophagitis, without bleeding: Secondary | ICD-10-CM | POA: Diagnosis not present

## 2022-07-26 DIAGNOSIS — G309 Alzheimer's disease, unspecified: Secondary | ICD-10-CM

## 2022-07-26 DIAGNOSIS — I480 Paroxysmal atrial fibrillation: Secondary | ICD-10-CM | POA: Diagnosis not present

## 2022-07-26 DIAGNOSIS — E782 Mixed hyperlipidemia: Secondary | ICD-10-CM | POA: Diagnosis not present

## 2022-07-26 DIAGNOSIS — S065X0D Traumatic subdural hemorrhage without loss of consciousness, subsequent encounter: Secondary | ICD-10-CM | POA: Diagnosis not present

## 2022-07-26 MED ORDER — DIVALPROEX SODIUM 125 MG PO DR TAB: 125.0000 mg | DELAYED_RELEASE_TABLET | Freq: Every evening | ORAL | 11 refills | Status: DC

## 2022-07-26 NOTE — Patient Instructions (Addendum)
It was a pleasure to see you today at our office.   Recommendations:  Follow up in 6  months  Discontinue donepezil 10 mg daily. Side effects were discussed  Start Depakote 125 mg at night for sundowning  Recommend walker or safety  January 25 2023 at 3:30    For assessment of decision of mental capacity and competency:  Call Dr. Anthoney Harada, geriatric psychiatrist at [phone]  Labs today  Whom to call:  Memory  decline, memory medications: Call our office [phone]   For psychiatric meds, mood meds: Please have your primary care physician manage these medications.    For guidance in geriatric dementia issues please call [facility] [phone]   If you have any severe symptoms of a stroke, or other severe issues such as confusion,severe chills or fever, etc call 911 or go to the ER as you may need to be evaluated further   Feel free to visit Facebook page " [email or web address]" for tips of how to care for people with memory problems.      RECOMMENDATIONS FOR ALL PATIENTS WITH MEMORY PROBLEMS: 1. Continue to exercise (Recommend 30 minutes of walking everyday, or 3 hours every week) 2. Increase social interactions - continue going to [place] and enjoy social gatherings with friends and family 3. Eat healthy, avoid fried foods and eat more fruits and vegetables 4. Maintain adequate blood pressure, blood sugar, and blood cholesterol level. Reducing the risk of stroke and cardiovascular disease also helps promoting better memory. 5. Avoid stressful situations. Live a simple life and avoid aggravations. Organize your time and prepare for the next day in anticipation. 6. Sleep well, avoid any interruptions of sleep and avoid any distractions in the bedroom that may interfere with adequate sleep quality 7. Avoid sugar, avoid sweets as there is a strong link between excessive sugar intake, diabetes, and cognitive impairment We discussed the Mediterranean diet, which has been shown  to help patients reduce the risk of progressive memory disorders and reduces cardiovascular risk. This includes eating fish, eat fruits and green leafy vegetables, nuts like almonds and hazelnuts, walnuts, and also use olive oil. Avoid fast foods and fried foods as much as possible. Avoid sweets and sugar as sugar use has been linked to worsening of memory function.  There is always a concern of gradual progression of memory problems. If this is the case, then we may need to adjust level of care according to patient needs. Support, both to the patient and caregiver, should then be put into place.    The Alzheimer's Association is here all day, every day for people facing Alzheimer's disease through our free 24/7 Helpline: [phone]. The Helpline provides reliable information and support to all those who need assistance, such as individuals living with memory loss, Alzheimer's or other dementia, caregivers, health care professionals and the public.  Our highly trained and knowledgeable staff can help you with: Understanding memory loss, dementia and Alzheimer's  Medications and other treatment options  General information about aging and brain health  Skills to provide quality care and to find the best care from professionals  Legal, financial and living-arrangement decisions Our Helpline also features: Confidential care consultation provided by master's level clinicians who can help with decision-making support, crisis assistance and education on issues families face every day  Help in a caller's preferred language using our translation service that features more than 200 languages and dialects  Referrals to local community programs, services and ongoing support  FALL PRECAUTIONS: Be cautious when walking. Scan the area for obstacles that may increase the risk of trips and falls. When getting up in the mornings, sit up at the edge of the bed for a few minutes before getting out of bed.  Consider elevating the bed at the head end to avoid drop of blood pressure when getting up. Walk always in a well-lit room (use night lights in the walls). Avoid area rugs or power cords from appliances in the middle of the walkways. Use a walker or a cane if necessary and consider physical therapy for balance exercise. Get your eyesight checked regularly.  FINANCIAL OVERSIGHT: Supervision, especially oversight when making financial decisions or transactions is also recommended.  HOME SAFETY: Consider the safety of the kitchen when operating appliances like stoves, microwave oven, and blender. Consider having supervision and share cooking responsibilities until no longer able to participate in those. Accidents with firearms and other hazards in the house should be identified and addressed as well.   ABILITY TO BE LEFT ALONE: If patient is unable to contact 911 operator, consider using LifeLine, or when the need is there, arrange for someone to stay with patients. Smoking is a fire hazard, consider supervision or cessation. Risk of wandering should be assessed by caregiver and if detected at any point, supervision and safe proof recommendations should be instituted.  MEDICATION SUPERVISION: Inability to self-administer medication needs to be constantly addressed. Implement a mechanism to ensure safe administration of the medications.   DRIVING: Regarding driving, in patients with progressive memory problems, driving will be impaired. We advise to have someone else do the driving if trouble finding directions or if minor accidents are reported. Independent driving assessment is available to determine safety of driving.   If you are interested in the driving assessment, you can contact the following:  [employer] in [place]  [place] [phone] or [phone]       [place] refers to food and lifestyle choices that are based on the traditions of countries located on the [employer]. This way of eating has been shown to help prevent certain conditions and improve outcomes for people who have chronic diseases, like kidney disease and heart disease. What are tips for following this plan? Lifestyle  Cook and eat meals together with your family, when possible. Drink enough fluid to keep your urine clear or pale yellow. Be physically active every day. This includes: Aerobic exercise like running or swimming. Leisure activities like gardening, walking, or housework. Get 7-8 hours of sleep each night. If recommended by your health care provider, drink red wine in moderation. This means 1 glass a day for nonpregnant women and 2 glasses a day for men. A glass of wine equals 5 oz (150 mL). Reading food labels  Check the serving size of packaged foods. For foods such as rice and pasta, the serving size refers to the amount of cooked product, not dry. Check the total fat in packaged foods. Avoid foods that have saturated fat or trans fats. Check the ingredients list for added sugars, such as corn syrup. Shopping  At the grocery store, buy most of your food from the areas near the walls of the store. This includes: Fresh fruits and vegetables (produce). Grains, beans, nuts, and seeds. Some of these may be available in unpackaged forms or large amounts (in bulk). Fresh seafood. Poultry and eggs. Low-fat dairy products.  Buy whole ingredients instead of prepackaged foods. Buy fresh fruits and vegetables in-season from local farmers markets. Buy frozen fruits and vegetables in resealable bags. If you do not have access to quality fresh seafood, buy precooked frozen shrimp or canned fish, such as tuna, salmon, or sardines. Buy small amounts of raw or cooked vegetables, salads, or olives from the deli or salad bar at your  store. Stock your pantry so you always have certain foods on hand, such as olive oil, canned tuna, canned tomatoes, rice, pasta, and beans. Cooking  Cook foods with extra-virgin olive oil instead of using butter or other vegetable oils. Have meat as a side dish, and have vegetables or grains as your main dish. This means having meat in small portions or adding small amounts of meat to foods like pasta or stew. Use beans or vegetables instead of meat in common dishes like chili or lasagna. Experiment with different cooking methods. Try roasting or broiling vegetables instead of steaming or sauteing them. Add frozen vegetables to soups, stews, pasta, or rice. Add nuts or seeds for added healthy fat at each meal. You can add these to yogurt, salads, or vegetable dishes. Marinate fish or vegetables using olive oil, lemon juice, garlic, and fresh herbs. Meal planning  Plan to eat 1 vegetarian meal one day each week. Try to work up to 2 vegetarian meals, if possible. Eat seafood 2 or more times a week. Have healthy snacks readily available, such as: Vegetable sticks with hummus. Greek yogurt. Fruit and nut trail mix. Eat balanced meals throughout the week. This includes: Fruit: 2-3 servings a day Vegetables: 4-5 servings a day Low-fat dairy: 2 servings a day Fish, poultry, or lean meat: 1 serving a day Beans and legumes: 2 or more servings a week Nuts and seeds: 1-2 servings a day Whole grains: 6-8 servings a day Extra-virgin olive oil: 3-4 servings a day Limit red meat and sweets to only a few servings a month What are my food choices? Mediterranean diet Recommended Grains: Whole-grain pasta. Brown rice. Bulgar wheat. Polenta. Couscous. Whole-wheat bread. [place]. Vegetables: Artichokes. Beets. Broccoli. Cabbage. Carrots. Eggplant. Green beans. Chard. Kale. Spinach. Onions. Leeks. Peas. Squash. Tomatoes. Peppers. Radishes. Fruits: Apples. Apricots. Avocado. Berries. Bananas.  [place]. Dates. Figs. Grapes. Lemons. Melon. Oranges. Peaches. Plums. Pomegranate. Meats and other protein foods: Beans. Almonds. Sunflower seeds. Pine nuts. Peanuts. [place]. Salmon. Scallops. Shrimp. [place]. Tilapia. Clams. Oysters. Eggs. Dairy: Low-fat milk. Cheese. Greek yogurt. Beverages: Water. Red wine. Herbal tea. Fats and oils: Extra virgin olive oil. Avocado oil. Grape seed oil. Sweets and desserts: [place] yogurt with honey. Baked apples. Poached pears. Trail mix. Seasoning and other foods: Basil. Cilantro. Coriander. Cumin. Mint. Parsley. Sage. Rosemary. Tarragon. Garlic. Oregano. Thyme. Pepper. Balsalmic vinegar. Tahini. Hummus. Tomato sauce. Olives. Mushrooms. Limit these Grains: Prepackaged pasta or rice dishes. Prepackaged cereal with added sugar. Vegetables: Deep fried potatoes (french fries). Fruits: Fruit canned in syrup. Meats and other protein foods: Beef. Pork. Lamb. Poultry with skin. Hot dogs. Berniece Salines. Dairy: Ice cream. Sour cream. Whole milk. Beverages: Juice. Sugar-sweetened soft drinks. Beer. Liquor and spirits. Fats and oils: Butter. Canola oil. Vegetable oil. Beef fat (tallow). Lard. Sweets and desserts: Cookies. Cakes. Pies. Candy. Seasoning and other foods: Mayonnaise. Premade sauces and marinades. The items listed may not be a complete list. Talk with your dietitian about what dietary choices are right for you. Summary The Mediterranean diet includes both food and lifestyle choices. Eat a variety of fresh fruits and vegetables, beans,  nuts, seeds, and whole grains. Limit the amount of red meat and sweets that you eat. Talk with your health care provider about whether it is safe for you to drink red wine in moderation. This means 1 glass a day for nonpregnant women and 2 glasses a day for men. A glass of wine equals 5 oz (150 mL). This information is not intended to replace advice given to you by your health care provider. Make sure you discuss any questions you have with  your health care provider. Document Released: 03/24/2016 Document Revised: 04/26/2016 Document Reviewed: 03/24/2016 Elsevier Interactive Patient Education  2017 [employer].

## 2022-07-26 NOTE — Progress Notes (Signed)
Assessment/Plan:    Major neurocognitive disorder with behavioral disturbance, late onset  Benjamin Mason is a very pleasant 88 y.o. 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 Patient is currently on donepezil 10 mg daily per PCP, tolerating well.  However, there is significant progression of the disease, as well as being increased needs of help with activities of daily living, and more pronounced sundowning.   Recommendations   Follow up in 6 months. Discontinue donepezil 10 mg daily as there is noted progression of the disease, and the risk of the medications await the benefits given his advanced age Start Depakote 125 mg at night, if needed may increase to 250 mg at night for sundowning Continue good control of cardiovascular risk factors Strongly recommend the use of walker due to increased risk of falls    Subjective:    This patient is accompanied in the office by his 2 daughters who supplement the history.  Previous records as well as any outside records available were reviewed prior to todays visit.  She was last seen on 04/19/2022, MMSE or MoCA was unable to be completed.   Any changes in memory since last visit?  He is daughter reports that there is a very noticeable decline, "nothing makes sense anymore, he has very uninhibited conversations, talks about certain body parts all the time which can get uncomfortable ".  He tries to do word searches His daughters report that his wife died a few weeks ago of a massive stroke.  He has very poor insight into this. repeats oneself?  Endorsed Disoriented when walking into a room?  Denies Leaving objects in  unusual places?  Denies Ambulates  with difficulty?   "He fell twice  per son 10/19 falling backwards, sustaining SAH.  He was discharged 06/07/2022 with nondisplaced fracture of the occipital calvarium.  They reversed the anticoagulation, then fall was on Dec 1 and he was sent back to memory care.  He then had an unwitnessed fall, with the staff finding him on the floor in the bathroom, he has bruising on the left hand knuckles and right elbow and another bruise in the back of his head.  He did not call for anyone, but the staff monitors him closely.  He is no longer on Eliquis due to these fall risk.  He had PT at the facility, and he is now using a cane, he is resistant to using a walker.   History of seizures?   Patient denies   Wandering behavior?  Patient denies   Patient drives?   Patient no longer drives  Any mood changes since last visit?  Daughters report that he may have more uninhibited behavior, and talks about his private parts without any filter Any worsening depression?:  Patient denies   Hallucinations?  "A few, such as bird on top of the trees.  "He has been experiencing sundowning behavior, where he gets more confused and has more hallucinations. Paranoia?  Patient denies   Patient reports that sleeps well without vivid dreams, REM behavior or sleepwalking   History of sleep apnea?  Patient denies   Any hygiene concerns?  Patient denies   Independent of bathing and  dressing?  Endorsed  Does the patient needs help with medications?  Staff in charge Who is in charge of the finances?  Daughters are in charge Any changes in appetite?  Some days he does not want to eat  Patient have trouble swallowing? Patient denies   Does the patient cook?  Patient denies   Any headaches?  Patient denies   Double vision? Patient denies   Any focal numbness or tingling?  Patient denies                                Chronic back pain Patient denies   Unilateral weakness?  Patient denies   Any  tremors?  Patient denies   Any history of anosmia?  Patient denies   Any incontinence of urine?  Endorsed Any bowel dysfunction?  Denies, but he has trouble understanding how to wipe himself.  Daughters report that he spends at times a very prolonged time in the bathroom Patient lives with: Memory care at [facility]  Initial evaluation 04/19/2022    How long did patient have memory difficulties? 4-5 years.  Initially, she was forgetting dates, people's names that he knew for a long time, when he began losing objects such as keys and watches.  "The last drop was after his fall in July 2023 "-daughter says.  He also has significant difficulty using tools such as the remote control.  He no longer does crossword puzzles because he gets frustrated.  He was patient lives: Adult living facility [facility]) repeats oneself?  Endorsed.  Speech has become tangential Disoriented when walking into a room?  Sometimes. Leaving objects in unusual places?  Patient denies   Ambulates  with difficulty?   Patient denies   Recent falls and head injury?  Endorsed, he had a presentation to the ED in July 2023 after sustaining a fall while wandering in the woods "looking for birds ".  He received PT.  No LOC, negative work-up at the ED, sent to [place] [facility] (03/01/2022) History of seizures?   Patient denies   Wandering behavior?  As above, he wandered into with prior to the fall.   Patient drives?   Patient no longer drives since getting lost when he was going to eat at a fast food restaurant.  He denies no longer driving, he reports that "my favorite is the highway "(the keys have been taken away) Any mood changes such irritability agitation?  "He has lost his patience about 1 year ago " Any history of depression?:  "Cried all the time until taking Zoloft for the last year ".  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  Sees birds in the trees.  Paranoia?   Hides his wallet and checkbook under the pillow "so it is safe " Patient reports that sleeps well without vivid dreams, REM behavior or sleepwalking    History of sleep apnea?  Patient denies   Any hygiene concerns? Endorsed, has to be reminded  Independent of bathing and dressing?  Endorsed  Does the patient needs help with medications?  Facility provides the medications Who is in charge of the finances?  Family is in charge. Any changes in appetite?  Patient denies   Patient have trouble swallowing?  Patient has a history of esophageal stricture, and has had evaluation by GI, but there are no difficulties with swallowing. Does the patient cook?  Patient denies  Any kitchen accidents such as leaving the stove on? Patient denies   Any headaches?  Patient denies   Double vision? Patient denies   Any focal numbness or tingling?  Patient denies   Chronic back pain Patient denies   Unilateral weakness?  Patient denies   Any tremors?  Patient denies   Any history of anosmia?  Patient denies   Any incontinence of urine?  Endorsed uses Depends  Any bowel dysfunction?   Patient denies   History of heavy alcohol intake?  Patient denies   History of heavy tobacco use?  Patient denies   Family history of dementia?  Mother had Alzheimer's disease     Labs 03/01/2022 H&H was 9.4-28.7 respectively, MCV 107.1 (high)   CT of the head without contrast 03/01/2022 (no film is available for review) showed moderate age-related atrophy and chronic microvascular ischemic changes, no acute intracranial hemorrhage, no fracture. PREVIOUS MEDICATIONS:   CURRENT MEDICATIONS:  Outpatient Encounter Medications as of 07/26/2022  Medication Sig   [DISCONTINUED] divalproex (DEPAKOTE) 125 MG DR tablet Take 1 tablet (125 mg total) by mouth at bedtime.   acetaminophen (TYLENOL) 650 MG CR tablet Take 650 mg by mouth 2 (two) times daily.   brimonidine (ALPHAGAN) 0.2 % ophthalmic solution Place 1 drop into both eyes 2  (two) times daily.   Cholecalciferol (VITAMIN D3) 25 MCG (1000 UT) CAPS Take 1,000 Units by mouth daily.   Cyanocobalamin (B-12) 1000 MCG TABS Take 1 tablet (1,000 mcg total) by mouth daily.   divalproex (DEPAKOTE) 125 MG DR tablet Take 1 tablet (125 mg total) by mouth at bedtime.   finasteride (PROSCAR) 5 MG tablet Take 5 mg by mouth daily.     loratadine (CLARITIN) 10 MG tablet Take 10 mg by mouth in the morning.   metoprolol tartrate (LOPRESSOR) 25 MG tablet Take 1 tablet (25 mg total) by mouth 2 (two) times daily.   midodrine (PROAMATINE) 5 MG tablet TAKE TWO (2) TABLETS BY MOUTH IN THE MORNING AND ONE TABLET AT BEDTIME   Nutritional Supplements (ENSURE HIGH PROTEIN) LIQD Take 237 mLs by mouth 3 (three) times daily after meals.   pantoprazole (PROTONIX) 40 MG tablet Take 40 mg by mouth daily.    sertraline (ZOLOFT) 50 MG tablet Take 50 mg by mouth every morning.   [DISCONTINUED] donepezil (ARICEPT) 10 MG tablet Take 10 mg by mouth at bedtime.   No facility-administered encounter medications on file as of 07/26/2022.        No data to display             No data to display          Objective:     PHYSICAL EXAMINATION:    VITALS:   Vitals:   07/26/22 1523  BP: 113/69  Pulse: 87  Resp: 18  SpO2: 97%  Weight: 147 lb (66.7 kg)  Height: 6' (1.829 m)    GEN:  The patient appears stated age and is in NAD. HEENT:  Normocephalic, atraumatic.   Neurological examination:  General: NAD, well-groomed, appears stated age. Orientation: The patient is alert. Oriented to person, not to place and date Cranial nerves: There is good facial symmetry.The speech is fluent and clear but tangential. No aphasia or dysarthria. Fund of knowledge is reduced. Recent and remote memory are impaired. Attention and concentration are reduced.  Able to name objects and repeat phrases.  Hearing is intact to conversational tone.    Sensation: Sensation is intact to light touch throughout  Motor:  Strength is at least antigravity x4. Tremors: none  DTR's 2/4 in UE/LE     Movement examination: Tone: There is normal tone in the UE/LE Abnormal movements:  no tremor.  No myoclonus.  No asterixis.   Coordination:  There is decremation with RAM's. Normal finger to nose  Gait and Station: The patient has no difficulty arising out of a deep-seated chair without the use of the hands, needs his cane. The patient's stride length is good.  Gait is cautious and narrow.    Thank you for allowing [place] the opportunity to participate in the care of this nice patient. Please do not hesitate to contact us for any questions or concerns.   Total time spent on today's visit was 46 minutes dedicated to this patient today, preparing to see patient, examining the patient, ordering tests and/or medications and counseling the patient, documenting clinical information in the EHR or other health record, independently interpreting results and communicating results to the patient/family, discussing treatment and goals, answering patient's questions and coordinating care.  Cc:  Manon Hilding, MD  Sharene Butters 07/26/2022 7:00 PM

## 2022-08-12 DIAGNOSIS — D649 Anemia, unspecified: Secondary | ICD-10-CM | POA: Diagnosis not present

## 2022-08-18 DIAGNOSIS — N390 Urinary tract infection, site not specified: Secondary | ICD-10-CM | POA: Diagnosis not present

## 2022-08-23 DIAGNOSIS — I10 Essential (primary) hypertension: Secondary | ICD-10-CM | POA: Diagnosis not present

## 2022-08-23 DIAGNOSIS — E782 Mixed hyperlipidemia: Secondary | ICD-10-CM | POA: Diagnosis not present

## 2022-08-23 DIAGNOSIS — I25811 Atherosclerosis of native coronary artery of transplanted heart without angina pectoris: Secondary | ICD-10-CM | POA: Diagnosis not present

## 2022-08-23 DIAGNOSIS — K2100 Gastro-esophageal reflux disease with esophagitis, without bleeding: Secondary | ICD-10-CM | POA: Diagnosis not present

## 2022-08-23 DIAGNOSIS — I480 Paroxysmal atrial fibrillation: Secondary | ICD-10-CM | POA: Diagnosis not present

## 2022-08-23 DIAGNOSIS — R296 Repeated falls: Secondary | ICD-10-CM | POA: Diagnosis not present

## 2022-08-23 DIAGNOSIS — N183 Chronic kidney disease, stage 3 unspecified: Secondary | ICD-10-CM | POA: Diagnosis not present

## 2022-08-23 DIAGNOSIS — K580 Irritable bowel syndrome with diarrhea: Secondary | ICD-10-CM | POA: Diagnosis not present

## 2022-08-23 DIAGNOSIS — N390 Urinary tract infection, site not specified: Secondary | ICD-10-CM | POA: Diagnosis not present

## 2022-08-23 DIAGNOSIS — S065X0D Traumatic subdural hemorrhage without loss of consciousness, subsequent encounter: Secondary | ICD-10-CM | POA: Diagnosis not present

## 2022-08-26 ENCOUNTER — Other Ambulatory Visit (HOSPITAL_COMMUNITY): Payer: Medicare Other

## 2022-08-26 ENCOUNTER — Emergency Department (HOSPITAL_COMMUNITY)
Admission: EM | Admit: 2022-08-26 | Discharge: 2022-08-26 | Disposition: A | Discharge status: Skilled Nursing Facility | Payer: Medicare Other | Attending: Emergency Medicine | Admitting: Emergency Medicine

## 2022-08-26 ENCOUNTER — Other Ambulatory Visit: Payer: Self-pay

## 2022-08-26 ENCOUNTER — Emergency Department (HOSPITAL_COMMUNITY): Payer: Medicare Other

## 2022-08-26 DIAGNOSIS — R600 Localized edema: Secondary | ICD-10-CM | POA: Diagnosis not present

## 2022-08-26 DIAGNOSIS — I480 Paroxysmal atrial fibrillation: Secondary | ICD-10-CM | POA: Diagnosis not present

## 2022-08-26 DIAGNOSIS — M7989 Other specified soft tissue disorders: Secondary | ICD-10-CM | POA: Diagnosis not present

## 2022-08-26 DIAGNOSIS — Y92129 Unspecified place in nursing home as the place of occurrence of the external cause: Secondary | ICD-10-CM | POA: Diagnosis not present

## 2022-08-26 DIAGNOSIS — N183 Chronic kidney disease, stage 3 unspecified: Secondary | ICD-10-CM | POA: Diagnosis not present

## 2022-08-26 DIAGNOSIS — W19XXXA Unspecified fall, initial encounter: Secondary | ICD-10-CM | POA: Insufficient documentation

## 2022-08-26 DIAGNOSIS — S4992XA Unspecified injury of left shoulder and upper arm, initial encounter: Secondary | ICD-10-CM | POA: Diagnosis present

## 2022-08-26 DIAGNOSIS — R296 Repeated falls: Secondary | ICD-10-CM | POA: Diagnosis not present

## 2022-08-26 DIAGNOSIS — F0390 Unspecified dementia without behavioral disturbance: Secondary | ICD-10-CM | POA: Insufficient documentation

## 2022-08-26 DIAGNOSIS — M79632 Pain in left forearm: Secondary | ICD-10-CM | POA: Diagnosis not present

## 2022-08-26 DIAGNOSIS — S065X0D Traumatic subdural hemorrhage without loss of consciousness, subsequent encounter: Secondary | ICD-10-CM | POA: Diagnosis not present

## 2022-08-26 DIAGNOSIS — S42202A Unspecified fracture of upper end of left humerus, initial encounter for closed fracture: Secondary | ICD-10-CM | POA: Diagnosis not present

## 2022-08-26 DIAGNOSIS — Z043 Encounter for examination and observation following other accident: Secondary | ICD-10-CM | POA: Diagnosis not present

## 2022-08-26 DIAGNOSIS — R059 Cough, unspecified: Secondary | ICD-10-CM | POA: Diagnosis not present

## 2022-08-26 DIAGNOSIS — M79622 Pain in left upper arm: Secondary | ICD-10-CM | POA: Diagnosis not present

## 2022-08-26 DIAGNOSIS — K2100 Gastro-esophageal reflux disease with esophagitis, without bleeding: Secondary | ICD-10-CM | POA: Diagnosis not present

## 2022-08-26 NOTE — Discharge Instructions (Signed)
Treatment for this counter fracture is a sling, this should get better over the next 4 to 6 weeks, hopefully he does not fall on it again, unfortunately there is nothing you can do to prevent that.  Tylenol or ibuprofen for pain, this will gradually improve  He will need to follow-up with an orthopedic surgeon within the next week

## 2022-08-26 NOTE — ED Provider Notes (Signed)
[facility] EMERGENCY DEPARTMENT Provider Note   CSN: [id number] Arrival date & time: 08/26/22  1914     History  Chief Complaint  Patient presents with   Benjamin Mason is a 89 y.o. male.   Fall   This patient is an 89 year old male, history of dementia currently in a memory care unit at a nursing facility who had a fall yesterday.  Was complaining of arm pain today so an x-ray was obtained and showed a humeral neck fracture, family member wanted him to come to the hospital for evaluation because of pain.  No other injuries or deformities, the patient is acting like his normal self.  No complaints of headache and no signs of injury to the head    Home Medications Prior to Admission medications   Medication Sig Start Date End Date Taking? Authorizing Provider  acetaminophen (TYLENOL) 650 MG CR tablet Take 650 mg by mouth 2 (two) times daily. 03/03/22   [provider]  brimonidine (ALPHAGAN) 0.2 % ophthalmic solution Place 1 drop into both eyes 2 (two) times daily. 01/25/22   [provider]  Cholecalciferol (VITAMIN D3) 25 MCG (1000 UT) CAPS Take 1,000 Units by mouth daily. 04/28/22   [provider]  Cyanocobalamin (B-12) 1000 MCG TABS Take 1 tablet (1,000 mcg total) by mouth daily. 04/20/22   Rondel Jumbo, PA-C  divalproex (DEPAKOTE) 125 MG DR tablet Take 1 tablet (125 mg total) by mouth at bedtime. 07/26/22   Rondel Jumbo, PA-C  finasteride (PROSCAR) 5 MG tablet Take 5 mg by mouth daily.      [provider]  loratadine (CLARITIN) 10 MG tablet Take 10 mg by mouth in the morning.    [provider]  metoprolol tartrate (LOPRESSOR) 25 MG tablet Take 1 tablet (25 mg total) by mouth 2 (two) times daily. 07/06/22   Satira Sark, MD  midodrine (PROAMATINE) 5 MG tablet TAKE TWO (2) TABLETS BY MOUTH IN THE MORNING AND ONE TABLET AT BEDTIME 07/11/22   Satira Sark, MD  Nutritional Supplements (ENSURE HIGH PROTEIN)  LIQD Take 237 mLs by mouth 3 (three) times daily after meals. 03/25/22   [provider]  pantoprazole (PROTONIX) 40 MG tablet Take 40 mg by mouth daily.  01/17/19   [provider]  sertraline (ZOLOFT) 50 MG tablet Take 50 mg by mouth every morning. 05/09/22   [provider]      Allergies    Patient has no known allergies.    Review of Systems   Review of Systems  Unable to perform ROS: Dementia    Physical Exam Updated Vital Signs BP 130/72   Pulse 90   Temp 98.4 F (36.9 C) (Oral)   Resp 18   SpO2 97%  Physical Exam Vitals and nursing note reviewed.  Constitutional:      General: He is not in acute distress.    Appearance: He is well-developed.  HENT:     Head: Normocephalic and atraumatic.     Mouth/Throat:     Pharynx: No oropharyngeal exudate.  Eyes:     General: No scleral icterus.       Right eye: No discharge.        Left eye: No discharge.     Conjunctiva/sclera: Conjunctivae normal.     Pupils: Pupils are equal, round, and reactive to light.  Neck:     Thyroid: No thyromegaly.     Vascular: No JVD.  Cardiovascular:     Rate and Rhythm: Normal rate and regular rhythm.     Heart sounds: Normal heart sounds. No murmur heard.    No friction rub. No gallop.  Pulmonary:     Effort: Pulmonary effort is normal. No respiratory distress.     Breath sounds: Normal breath sounds. No wheezing or rales.  Abdominal:     General: Bowel sounds are normal. There is no distension.     Palpations: Abdomen is soft. There is no mass.     Tenderness: There is no abdominal tenderness.  Musculoskeletal:        General: Tenderness present.     Cervical back: Normal range of motion and neck supple.     Right lower leg: No edema.     Left lower leg: No edema.     Comments: Decreased range of motion of the left shoulder secondary to pain, movement of the left elbow also causes pain but no obvious swelling or deformity at that location.  Normal pulses  distal to this.  Lymphadenopathy:     Cervical: No cervical adenopathy.  Skin:    General: Skin is warm and dry.     Findings: No erythema or rash.  Neurological:     Mental Status: He is alert.     Coordination: Coordination normal.  Psychiatric:        Behavior: Behavior normal.     ED Results / Procedures / Treatments   Labs (all labs ordered are listed, but only abnormal results are displayed) Labs Reviewed - No data to display  EKG None  Radiology DG Humerus Left  Result Date: 08/26/2022 CLINICAL DATA:  fall EXAM: LEFT HUMERUS - 2+ VIEW COMPARISON:  None Available. FINDINGS: Acute displaced and likely impacted humeral neck fracture. No left shoulder dislocation. Degenerative changes of the acromioclavicular joint. Overlying subcutaneus soft tissue edema. IMPRESSION: Acute displaced in likely impacted humeral neck fracture. Electronically Signed   By: Iven Finn M.D.   On: 08/26/2022 21:18   DG Elbow Complete Left  Result Date: 08/26/2022 CLINICAL DATA:  Fall EXAM: LEFT ELBOW - COMPLETE 3+ VIEW COMPARISON:  None Available. FINDINGS: No acute bony abnormality. Specifically, no fracture, subluxation, or dislocation. No joint effusion. Soft tissues are intact. IMPRESSION: No acute bony abnormality. Electronically Signed   By: Rolm Baptise M.D.   On: 08/26/2022 21:16    Procedures Procedures    Medications Ordered in ED Medications - No data to display  ED Course/ Medical Decision Making/ A&P                             Medical Decision Making Amount and/or Complexity of Data Reviewed Radiology: ordered.   Likely fracture of humeral neck given prior report, will repeat imaging and placed in a sling, patient has good neurovascular status, no other injuries\  Imaging: I personally viewed and interpreted the x-rays of the arm including the humerus and the elbow, there is a fracture of the proximal humerus at the neck, no other acute fractures  Sling, placed by  nursing, I checked neurovascular status after was placed and it is completely intact  Family member given information regarding diagnosis and treatment plan, they are agreeable, orthopedic referral made        Final Clinical Impression(s) / ED Diagnoses Final diagnoses:  Closed fracture of proximal end of left humerus, unspecified fracture morphology, initial encounter    Rx / DC Orders ED  Discharge Orders     None         Noemi Chapel, MD 08/26/22 2146

## 2022-08-26 NOTE — ED Triage Notes (Signed)
Pt from [facility], EMS states pt had a fall at facility yesterday and had c/o left arm pain. Pt had Xray this AM showing "slightly displaced fracture of left humeral neck". Pt has pain with movement, EMS placed pt in sling for comfort.  Pt daughter called wanting to have pt reevaluated here.

## 2022-08-30 DIAGNOSIS — S42302A Unspecified fracture of shaft of humerus, left arm, initial encounter for closed fracture: Secondary | ICD-10-CM | POA: Diagnosis not present

## 2022-08-30 DIAGNOSIS — R296 Repeated falls: Secondary | ICD-10-CM | POA: Diagnosis not present

## 2022-08-30 DIAGNOSIS — Z8616 Personal history of COVID-19: Secondary | ICD-10-CM | POA: Diagnosis not present

## 2022-09-06 ENCOUNTER — Ambulatory Visit: Payer: Medicare Other | Admitting: Orthopedic Surgery

## 2022-09-06 ENCOUNTER — Encounter: Payer: Self-pay | Admitting: Orthopedic Surgery

## 2022-09-06 ENCOUNTER — Ambulatory Visit (INDEPENDENT_AMBULATORY_CARE_PROVIDER_SITE_OTHER): Payer: Medicare Other

## 2022-09-06 VITALS — BP 144/89 | HR 80 | Ht 72.0 in | Wt 147.0 lb

## 2022-09-06 DIAGNOSIS — D649 Anemia, unspecified: Secondary | ICD-10-CM | POA: Diagnosis not present

## 2022-09-06 DIAGNOSIS — M25512 Pain in left shoulder: Secondary | ICD-10-CM

## 2022-09-06 DIAGNOSIS — S42202A Unspecified fracture of upper end of left humerus, initial encounter for closed fracture: Secondary | ICD-10-CM

## 2022-09-06 NOTE — Patient Instructions (Signed)
Sling at all times.  No weightbearing.  Medications as needed  Encouraged him to stay in the sling is much as possible.  As his shoulder starts to feel better, he may start using the arm more.

## 2022-09-06 NOTE — Progress Notes (Signed)
New Patient Visit  Assessment: Benjamin Mason is a 89 y.o. male with the following: 1. Closed fracture of proximal end of left humerus, unspecified fracture morphology, initial encounter  Plan: Benjamin Mason has advanced dementia.  He sustained a fall recently.  He has a left proximal humerus fracture.  He has remained in his sling.  His pain appears to be controlled.  He is not using the left shoulder.  Radiographs today are stable.  We will continue to monitor this closely.  He should stay in the sling.  He was fitted for a larger sling today.  I will see him back in 2 weeks.  Follow-up: Return in about 2 weeks (around 09/20/2022).  Subjective:  Chief Complaint  Patient presents with   Shoulder Injury    Left     History of Present Illness: Benjamin Mason is a 89 y.o. male who presents for evaluation of left shoulder pain.  He lives at [place].  He does have advanced dementia.  He has sustained falls recently.  Radiographs demonstrated a proximal humerus fracture on the left.  He was evaluated in the emergency department.  He has been in the sling for the past week or so.  Unfortunately, due to his cognitive status, the sling does not stay in position.Marland Kitchen  He is no longer taking a blood thinner due to repeated falls.   Review of Systems: Unable to assess.  Medical History:  Past Medical History:  Diagnosis Date   Anxiety    Aortic regurgitation    Mild to moderate   CKD (chronic kidney disease) stage 3, GFR 30-59 ml/min (HCC)    Coronary atherosclerosis of native coronary artery    BMS to RCA and LAD 1995   Essential hypertension    Frequent PVCs    IBS (irritable bowel syndrome)    Lactose intolerance    Mitral regurgitation    Moderate   Mixed hyperlipidemia    Permanent atrial fibrillation (HCC)    Sick sinus syndrome [facility])    [employer] Jude single-chamber pacemaker May 2021 - Dr. Caryl Comes   Skin cancer    Skull fracture [facility])     Past  Surgical History:  Procedure Laterality Date   BALLOON DILATION  08/03/2012   Procedure: BALLOON DILATION;  Surgeon: Rogene Houston, MD;  Location[facility];  Service: Endoscopy;  Laterality: N/A;   COLONOSCOPY WITH ESOPHAGOGASTRODUODENOSCOPY (EGD)  08/03/2012   Procedure: COLONOSCOPY WITH ESOPHAGOGASTRODUODENOSCOPY (EGD);  Surgeon: Rogene Houston, MD;  Location: [facility];  Service: Endoscopy;  Laterality: N/A;  215   LEFT HEART CATH AND CORONARY ANGIOGRAPHY N/A 11/18/2016   Procedure: Left Heart Cath and Coronary Angiography;  Surgeon: Peter M Martinique, MD;  Location: [place] CV LAB;  Service: Cardiovascular;  Laterality: N/A;   LEFT INGUINAL HERNIA     MALONEY DILATION  08/03/2012   Procedure: MALONEY DILATION;  Surgeon: Rogene Houston, MD;  Location: [facility];  Service: Endoscopy;  Laterality: N/A;   PACEMAKER IMPLANT N/A 12/30/2019   Procedure: PACEMAKER IMPLANT;  Surgeon: Deboraha Sprang, MD;  Location: [place] CV LAB;  Service: Cardiovascular;  Laterality: N/A;   PENILE PROSTHESIS IMPLANT     SAVORY DILATION  08/03/2012   Procedure: SAVORY DILATION;  Surgeon: Rogene Houston, MD;  Location: [facility];  Service: Endoscopy;  Laterality: N/A;    Family History  Problem Relation Age of Onset   Heart attack Father  in his early 57's   Social History   Tobacco Use   Smoking status: Former    Packs/day: 2.00    Years: 25.00    Total pack years: 50.00    Types: Cigarettes    Quit date: 08/15/1962    Years since quitting: 60.1   Smokeless tobacco: Never  Vaping Use   Vaping Use: Never used  Substance Use Topics   Alcohol use: No    Alcohol/week: 0.0 standard drinks of alcohol   Drug use: No    No Known Allergies  Current Meds  Medication Sig   acetaminophen (TYLENOL) 650 MG CR tablet Take 650 mg by mouth 2 (two) times daily.   brimonidine (ALPHAGAN) 0.2 % ophthalmic solution Place 1 drop into both eyes 2 (two) times daily.   Cholecalciferol  (VITAMIN D3) 25 MCG (1000 UT) CAPS Take 1,000 Units by mouth daily.   Cyanocobalamin (B-12) 1000 MCG TABS Take 1 tablet (1,000 mcg total) by mouth daily.   divalproex (DEPAKOTE) 125 MG DR tablet Take 1 tablet (125 mg total) by mouth at bedtime.   finasteride (PROSCAR) 5 MG tablet Take 5 mg by mouth daily.     hydrOXYzine (ATARAX) 10 MG tablet SMARTSIG:1 Tablet(s) By Mouth Every 12 Hours PRN   LAGEVRIO 200 MG CAPS capsule SMARTSIG:4 Capsule(s) By Mouth Every 12 Hours   loratadine (CLARITIN) 10 MG tablet Take 10 mg by mouth in the morning.   metoprolol tartrate (LOPRESSOR) 25 MG tablet Take 1 tablet (25 mg total) by mouth 2 (two) times daily.   midodrine (PROAMATINE) 5 MG tablet TAKE TWO (2) TABLETS BY MOUTH IN THE MORNING AND ONE TABLET AT BEDTIME   Nutritional Supplements (ENSURE HIGH PROTEIN) LIQD Take 237 mLs by mouth 3 (three) times daily after meals.   pantoprazole (PROTONIX) 40 MG tablet Take 40 mg by mouth daily.    sertraline (ZOLOFT) 50 MG tablet Take 50 mg by mouth every morning.    Objective: BP (!) 144/89   Pulse 80   Ht 6' (1.829 m)   Wt 147 lb (66.7 kg)   BMI 19.94 kg/m   Physical Exam:  General: Elderly male., No acute distress., Seated in a wheelchair., and Demented, does not answer questions appropriately.  Gait: Unable to ambulate.  Left arm with diffuse swelling and bruising.  Tenderness to palpation in the left shoulder.  Fingers are warm and well-perfused.  He does actively move all fingers.  2+ radial pulse.  IMAGING: I personally ordered and reviewed the following images  Shoulder obtained in clinic today.  There is a fracture through the surgical neck area of the humerus, with proximal displacement of the shaft fragment.  There appears to be some impaction.  Glenohumeral joint is reduced.  No bony lesions.  Impression: Left proximal humerus fracture, with impaction and minimal displacement.   New Medications:  No orders of the defined types were placed in  this encounter.     Mordecai Rasmussen, MD  09/06/2022 2:15 PM

## 2022-09-12 ENCOUNTER — Other Ambulatory Visit: Payer: Self-pay | Admitting: Cardiology

## 2022-09-20 ENCOUNTER — Ambulatory Visit: Payer: Medicare Other | Admitting: Orthopedic Surgery

## 2022-09-20 DIAGNOSIS — I25811 Atherosclerosis of native coronary artery of transplanted heart without angina pectoris: Secondary | ICD-10-CM | POA: Diagnosis not present

## 2022-09-20 DIAGNOSIS — K580 Irritable bowel syndrome with diarrhea: Secondary | ICD-10-CM | POA: Diagnosis not present

## 2022-09-20 DIAGNOSIS — N183 Chronic kidney disease, stage 3 unspecified: Secondary | ICD-10-CM | POA: Diagnosis not present

## 2022-09-20 DIAGNOSIS — R296 Repeated falls: Secondary | ICD-10-CM | POA: Diagnosis not present

## 2022-09-20 DIAGNOSIS — S065X0D Traumatic subdural hemorrhage without loss of consciousness, subsequent encounter: Secondary | ICD-10-CM | POA: Diagnosis not present

## 2022-09-20 DIAGNOSIS — I480 Paroxysmal atrial fibrillation: Secondary | ICD-10-CM | POA: Diagnosis not present

## 2022-09-20 DIAGNOSIS — I10 Essential (primary) hypertension: Secondary | ICD-10-CM | POA: Diagnosis not present

## 2022-09-20 DIAGNOSIS — K2100 Gastro-esophageal reflux disease with esophagitis, without bleeding: Secondary | ICD-10-CM | POA: Diagnosis not present

## 2022-09-20 DIAGNOSIS — E782 Mixed hyperlipidemia: Secondary | ICD-10-CM | POA: Diagnosis not present

## 2022-09-22 DIAGNOSIS — N390 Urinary tract infection, site not specified: Secondary | ICD-10-CM | POA: Diagnosis not present

## 2022-09-26 DIAGNOSIS — D649 Anemia, unspecified: Secondary | ICD-10-CM | POA: Diagnosis not present

## 2022-09-27 ENCOUNTER — Emergency Department (HOSPITAL_COMMUNITY): Payer: Medicare Other

## 2022-09-27 ENCOUNTER — Encounter (HOSPITAL_COMMUNITY): Payer: Self-pay

## 2022-09-27 ENCOUNTER — Inpatient Hospital Stay (HOSPITAL_COMMUNITY)
Admission: EM | Admit: 2022-09-27 | Discharge: 2022-10-01 | DRG: 872 | Disposition: A | Discharge status: Home / Self Care | Payer: Medicare Other | Source: Skilled Nursing Facility | Attending: Family Medicine | Admitting: Family Medicine

## 2022-09-27 ENCOUNTER — Other Ambulatory Visit: Payer: Self-pay

## 2022-09-27 DIAGNOSIS — R339 Retention of urine, unspecified: Secondary | ICD-10-CM | POA: Diagnosis present

## 2022-09-27 DIAGNOSIS — F0150 Vascular dementia without behavioral disturbance: Secondary | ICD-10-CM | POA: Diagnosis present

## 2022-09-27 DIAGNOSIS — Z7401 Bed confinement status: Secondary | ICD-10-CM | POA: Diagnosis not present

## 2022-09-27 DIAGNOSIS — D509 Iron deficiency anemia, unspecified: Secondary | ICD-10-CM | POA: Diagnosis not present

## 2022-09-27 DIAGNOSIS — M47812 Spondylosis without myelopathy or radiculopathy, cervical region: Secondary | ICD-10-CM | POA: Diagnosis not present

## 2022-09-27 DIAGNOSIS — E782 Mixed hyperlipidemia: Secondary | ICD-10-CM | POA: Diagnosis not present

## 2022-09-27 DIAGNOSIS — I129 Hypertensive chronic kidney disease with stage 1 through stage 4 chronic kidney disease, or unspecified chronic kidney disease: Secondary | ICD-10-CM | POA: Diagnosis present

## 2022-09-27 DIAGNOSIS — A419 Sepsis, unspecified organism: Secondary | ICD-10-CM | POA: Diagnosis present

## 2022-09-27 DIAGNOSIS — R296 Repeated falls: Secondary | ICD-10-CM | POA: Diagnosis present

## 2022-09-27 DIAGNOSIS — Z66 Do not resuscitate: Secondary | ICD-10-CM | POA: Diagnosis not present

## 2022-09-27 DIAGNOSIS — Z515 Encounter for palliative care: Secondary | ICD-10-CM | POA: Diagnosis not present

## 2022-09-27 DIAGNOSIS — N179 Acute kidney failure, unspecified: Secondary | ICD-10-CM | POA: Diagnosis present

## 2022-09-27 DIAGNOSIS — N390 Urinary tract infection, site not specified: Secondary | ICD-10-CM | POA: Diagnosis present

## 2022-09-27 DIAGNOSIS — I4891 Unspecified atrial fibrillation: Secondary | ICD-10-CM | POA: Diagnosis not present

## 2022-09-27 DIAGNOSIS — I351 Nonrheumatic aortic (valve) insufficiency: Secondary | ICD-10-CM | POA: Diagnosis present

## 2022-09-27 DIAGNOSIS — F419 Anxiety disorder, unspecified: Secondary | ICD-10-CM | POA: Diagnosis present

## 2022-09-27 DIAGNOSIS — R338 Other retention of urine: Secondary | ICD-10-CM | POA: Diagnosis present

## 2022-09-27 DIAGNOSIS — S72001A Fracture of unspecified part of neck of right femur, initial encounter for closed fracture: Secondary | ICD-10-CM | POA: Diagnosis not present

## 2022-09-27 DIAGNOSIS — W19XXXA Unspecified fall, initial encounter: Secondary | ICD-10-CM | POA: Diagnosis present

## 2022-09-27 DIAGNOSIS — G309 Alzheimer's disease, unspecified: Secondary | ICD-10-CM | POA: Diagnosis not present

## 2022-09-27 DIAGNOSIS — N189 Chronic kidney disease, unspecified: Secondary | ICD-10-CM

## 2022-09-27 DIAGNOSIS — Z85828 Personal history of other malignant neoplasm of skin: Secondary | ICD-10-CM

## 2022-09-27 DIAGNOSIS — N1832 Chronic kidney disease, stage 3b: Secondary | ICD-10-CM | POA: Diagnosis present

## 2022-09-27 DIAGNOSIS — R6520 Severe sepsis without septic shock: Secondary | ICD-10-CM | POA: Diagnosis not present

## 2022-09-27 DIAGNOSIS — R279 Unspecified lack of coordination: Secondary | ICD-10-CM | POA: Diagnosis not present

## 2022-09-27 DIAGNOSIS — I499 Cardiac arrhythmia, unspecified: Secondary | ICD-10-CM | POA: Diagnosis not present

## 2022-09-27 DIAGNOSIS — I2510 Atherosclerotic heart disease of native coronary artery without angina pectoris: Secondary | ICD-10-CM | POA: Diagnosis not present

## 2022-09-27 DIAGNOSIS — N401 Enlarged prostate with lower urinary tract symptoms: Secondary | ICD-10-CM | POA: Diagnosis present

## 2022-09-27 DIAGNOSIS — Z79899 Other long term (current) drug therapy: Secondary | ICD-10-CM

## 2022-09-27 DIAGNOSIS — W1830XA Fall on same level, unspecified, initial encounter: Secondary | ICD-10-CM | POA: Diagnosis present

## 2022-09-27 DIAGNOSIS — M25551 Pain in right hip: Secondary | ICD-10-CM | POA: Diagnosis not present

## 2022-09-27 DIAGNOSIS — M542 Cervicalgia: Secondary | ICD-10-CM | POA: Diagnosis not present

## 2022-09-27 DIAGNOSIS — R2689 Other abnormalities of gait and mobility: Secondary | ICD-10-CM | POA: Diagnosis not present

## 2022-09-27 DIAGNOSIS — R1319 Other dysphagia: Secondary | ICD-10-CM | POA: Diagnosis not present

## 2022-09-27 DIAGNOSIS — M6281 Muscle weakness (generalized): Secondary | ICD-10-CM | POA: Diagnosis not present

## 2022-09-27 DIAGNOSIS — Z7189 Other specified counseling: Secondary | ICD-10-CM | POA: Diagnosis not present

## 2022-09-27 DIAGNOSIS — Z0389 Encounter for observation for other suspected diseases and conditions ruled out: Secondary | ICD-10-CM | POA: Diagnosis not present

## 2022-09-27 DIAGNOSIS — I4821 Permanent atrial fibrillation: Secondary | ICD-10-CM | POA: Diagnosis present

## 2022-09-27 DIAGNOSIS — D631 Anemia in chronic kidney disease: Secondary | ICD-10-CM | POA: Diagnosis not present

## 2022-09-27 DIAGNOSIS — Z9181 History of falling: Secondary | ICD-10-CM

## 2022-09-27 DIAGNOSIS — E739 Lactose intolerance, unspecified: Secondary | ICD-10-CM | POA: Diagnosis not present

## 2022-09-27 DIAGNOSIS — R509 Fever, unspecified: Secondary | ICD-10-CM | POA: Diagnosis not present

## 2022-09-27 DIAGNOSIS — G319 Degenerative disease of nervous system, unspecified: Secondary | ICD-10-CM | POA: Diagnosis not present

## 2022-09-27 DIAGNOSIS — F01518 Vascular dementia, unspecified severity, with other behavioral disturbance: Secondary | ICD-10-CM | POA: Diagnosis not present

## 2022-09-27 DIAGNOSIS — D649 Anemia, unspecified: Secondary | ICD-10-CM | POA: Diagnosis not present

## 2022-09-27 DIAGNOSIS — I495 Sick sinus syndrome: Secondary | ICD-10-CM | POA: Diagnosis present

## 2022-09-27 DIAGNOSIS — S020XXA Fracture of vault of skull, initial encounter for closed fracture: Secondary | ICD-10-CM | POA: Diagnosis not present

## 2022-09-27 DIAGNOSIS — N139 Obstructive and reflux uropathy, unspecified: Secondary | ICD-10-CM | POA: Diagnosis not present

## 2022-09-27 DIAGNOSIS — Z87891 Personal history of nicotine dependence: Secondary | ICD-10-CM

## 2022-09-27 DIAGNOSIS — A4151 Sepsis due to Escherichia coli [E. coli]: Secondary | ICD-10-CM | POA: Diagnosis present

## 2022-09-27 DIAGNOSIS — Z960 Presence of urogenital implants: Secondary | ICD-10-CM | POA: Diagnosis not present

## 2022-09-27 DIAGNOSIS — S3993XA Unspecified injury of pelvis, initial encounter: Secondary | ICD-10-CM | POA: Diagnosis not present

## 2022-09-27 DIAGNOSIS — K5730 Diverticulosis of large intestine without perforation or abscess without bleeding: Secondary | ICD-10-CM | POA: Diagnosis not present

## 2022-09-27 DIAGNOSIS — I6782 Cerebral ischemia: Secondary | ICD-10-CM | POA: Diagnosis not present

## 2022-09-27 DIAGNOSIS — Z8249 Family history of ischemic heart disease and other diseases of the circulatory system: Secondary | ICD-10-CM

## 2022-09-27 DIAGNOSIS — N1831 Chronic kidney disease, stage 3a: Secondary | ICD-10-CM | POA: Diagnosis not present

## 2022-09-27 DIAGNOSIS — I10 Essential (primary) hypertension: Secondary | ICD-10-CM | POA: Diagnosis present

## 2022-09-27 DIAGNOSIS — Z950 Presence of cardiac pacemaker: Secondary | ICD-10-CM

## 2022-09-27 DIAGNOSIS — F32A Depression, unspecified: Secondary | ICD-10-CM | POA: Diagnosis present

## 2022-09-27 DIAGNOSIS — Z1152 Encounter for screening for COVID-19: Secondary | ICD-10-CM | POA: Diagnosis not present

## 2022-09-27 DIAGNOSIS — R1310 Dysphagia, unspecified: Secondary | ICD-10-CM | POA: Diagnosis not present

## 2022-09-27 DIAGNOSIS — Z743 Need for continuous supervision: Secondary | ICD-10-CM | POA: Diagnosis not present

## 2022-09-27 DIAGNOSIS — I951 Orthostatic hypotension: Secondary | ICD-10-CM | POA: Diagnosis not present

## 2022-09-27 DIAGNOSIS — E860 Dehydration: Secondary | ICD-10-CM | POA: Diagnosis not present

## 2022-09-27 DIAGNOSIS — F0153 Vascular dementia, unspecified severity, with mood disturbance: Secondary | ICD-10-CM | POA: Diagnosis present

## 2022-09-27 LAB — COMPREHENSIVE METABOLIC PANEL
ALT: 16 U/L (ref 0–44)
AST: 26 U/L (ref 15–41)
Albumin: 2.2 g/dL — ABNORMAL LOW (ref 3.5–5.0)
Alkaline Phosphatase: 58 U/L (ref 38–126)
Anion gap: 11 (ref 5–15)
BUN: 53 mg/dL — ABNORMAL HIGH (ref 8–23)
CO2: 17 mmol/L — ABNORMAL LOW (ref 22–32)
Calcium: 7.2 mg/dL — ABNORMAL LOW (ref 8.9–10.3)
Chloride: 112 mmol/L — ABNORMAL HIGH (ref 98–111)
Creatinine, Ser: 2.34 mg/dL — ABNORMAL HIGH (ref 0.61–1.24)
GFR, Estimated: 26 mL/min — ABNORMAL LOW (ref >60)
Glucose, Bld: 93 mg/dL (ref 70–99)
Potassium: 4 mmol/L (ref 3.5–5.1)
Sodium: 140 mmol/L (ref 135–145)
Total Bilirubin: 0.7 mg/dL (ref 0.3–1.2)
Total Protein: 5.7 g/dL — ABNORMAL LOW (ref 6.5–8.1)

## 2022-09-27 LAB — URINALYSIS, W/ REFLEX TO CULTURE (INFECTION SUSPECTED)
Bilirubin Urine: NEGATIVE
Glucose, UA: NEGATIVE mg/dL
Ketones, ur: NEGATIVE mg/dL
Nitrite: NEGATIVE
Protein, ur: 100 mg/dL — AB
RBC / HPF: >50 RBC/hpf (ref 0–5)
Specific Gravity, Urine: 1.015 (ref 1.005–1.030)
WBC, UA: >50 WBC/hpf (ref 0–5)
pH: 5 (ref 5.0–8.0)

## 2022-09-27 LAB — RESP PANEL BY RT-PCR (RSV, FLU A&B, COVID)  RVPGX2
Influenza A by PCR: NEGATIVE
Influenza B by PCR: NEGATIVE
Resp Syncytial Virus by PCR: NEGATIVE
SARS Coronavirus 2 by RT PCR: NEGATIVE

## 2022-09-27 LAB — FERRITIN: Ferritin: 746 ng/mL — ABNORMAL HIGH (ref 24–336)

## 2022-09-27 LAB — URINALYSIS, ROUTINE W REFLEX MICROSCOPIC
Bilirubin Urine: NEGATIVE
Glucose, UA: NEGATIVE mg/dL
Ketones, ur: NEGATIVE mg/dL
Nitrite: NEGATIVE
Protein, ur: NEGATIVE mg/dL
Specific Gravity, Urine: 1.013 (ref 1.005–1.030)
pH: 5 (ref 5.0–8.0)

## 2022-09-27 LAB — CBC WITH DIFFERENTIAL/PLATELET
Abs Immature Granulocytes: 0.19 10*3/uL — ABNORMAL HIGH (ref 0.00–0.07)
Basophils Absolute: 0 10*3/uL (ref 0.0–0.1)
Basophils Relative: 0 %
Eosinophils Absolute: 0 10*3/uL (ref 0.0–0.5)
Eosinophils Relative: 0 %
HCT: 23.7 % — ABNORMAL LOW (ref 39.0–52.0)
Hemoglobin: 7.3 g/dL — ABNORMAL LOW (ref 13.0–17.0)
Immature Granulocytes: 1 %
Lymphocytes Relative: 3 %
Lymphs Abs: 0.4 10*3/uL — ABNORMAL LOW (ref 0.7–4.0)
MCH: 33.8 pg (ref 26.0–34.0)
MCHC: 30.8 g/dL (ref 30.0–36.0)
MCV: 109.7 fL — ABNORMAL HIGH (ref 80.0–100.0)
Monocytes Absolute: 0.5 10*3/uL (ref 0.1–1.0)
Monocytes Relative: 3 %
Neutro Abs: 13.9 10*3/uL — ABNORMAL HIGH (ref 1.7–7.7)
Neutrophils Relative %: 93 %
Platelets: 207 10*3/uL (ref 150–400)
RBC: 2.16 MIL/uL — ABNORMAL LOW (ref 4.22–5.81)
RDW: 14 % (ref 11.5–15.5)
WBC: 15.1 10*3/uL — ABNORMAL HIGH (ref 4.0–10.5)
nRBC: 0 % (ref 0.0–0.2)

## 2022-09-27 LAB — POC OCCULT BLOOD, ED: Fecal Occult Bld: NEGATIVE

## 2022-09-27 LAB — MAGNESIUM: Magnesium: 1.2 mg/dL — ABNORMAL LOW (ref 1.7–2.4)

## 2022-09-27 LAB — SAMPLE TO BLOOD BANK

## 2022-09-27 LAB — IRON AND TIBC
Iron: 10 ug/dL — ABNORMAL LOW (ref 45–182)
Saturation Ratios: 5 % — ABNORMAL LOW (ref 17.9–39.5)
TIBC: 193 ug/dL — ABNORMAL LOW (ref 250–450)
UIBC: 183 ug/dL

## 2022-09-27 LAB — MRSA NEXT GEN BY PCR, NASAL: MRSA by PCR Next Gen: NOT DETECTED

## 2022-09-27 LAB — LACTIC ACID, PLASMA
Lactic Acid, Venous: 2 mmol/L (ref 0.5–1.9)
Lactic Acid, Venous: 1.6 mmol/L (ref 0.5–1.9)

## 2022-09-27 MED ORDER — METOPROLOL TARTRATE 5 MG/5ML IV SOLN
5.0000 mg | INTRAVENOUS | Status: DC | PRN
  Administered 2022-09-27 (×3): 5 mg via INTRAVENOUS
  Filled 2022-09-27 (×3): qty 5

## 2022-09-27 MED ORDER — MIDODRINE HCL 5 MG PO TABS
10.0000 mg | ORAL_TABLET | Freq: Two times a day (BID) | ORAL | Status: DC
  Administered 2022-09-28 – 2022-10-01 (×7): 10 mg via ORAL
  Filled 2022-09-27 (×8): qty 2

## 2022-09-27 MED ORDER — METOPROLOL TARTRATE 25 MG PO TABS
25.0000 mg | ORAL_TABLET | Freq: Two times a day (BID) | ORAL | Status: DC
  Administered 2022-09-27 – 2022-10-01 (×8): 25 mg via ORAL
  Filled 2022-09-27 (×8): qty 1

## 2022-09-27 MED ORDER — MELATONIN 3 MG PO TABS
6.0000 mg | ORAL_TABLET | Freq: Every evening | ORAL | Status: DC | PRN
  Administered 2022-09-27 – 2022-09-30 (×3): 6 mg via ORAL
  Filled 2022-09-27 (×3): qty 2

## 2022-09-27 MED ORDER — HYDROXYZINE HCL 10 MG PO TABS
10.0000 mg | ORAL_TABLET | Freq: Two times a day (BID) | ORAL | Status: DC | PRN
  Administered 2022-09-27 – 2022-09-30 (×3): 10 mg via ORAL
  Filled 2022-09-27 (×3): qty 1

## 2022-09-27 MED ORDER — PANTOPRAZOLE SODIUM 40 MG PO TBEC
40.0000 mg | DELAYED_RELEASE_TABLET | Freq: Every day | ORAL | Status: DC
  Administered 2022-09-28 – 2022-10-01 (×4): 40 mg via ORAL
  Filled 2022-09-27 (×4): qty 1

## 2022-09-27 MED ORDER — DILTIAZEM HCL-DEXTROSE 125-5 MG/125ML-% IV SOLN (PREMIX)
5.0000 mg/h | INTRAVENOUS | Status: DC
  Administered 2022-09-27: 5 mg/h via INTRAVENOUS
  Filled 2022-09-27: qty 125

## 2022-09-27 MED ORDER — DIVALPROEX SODIUM 125 MG PO DR TAB
125.0000 mg | DELAYED_RELEASE_TABLET | Freq: Every day | ORAL | Status: DC
  Administered 2022-09-28 – 2022-09-30 (×3): 125 mg via ORAL
  Filled 2022-09-27 (×6): qty 1

## 2022-09-27 MED ORDER — METOPROLOL TARTRATE 5 MG/5ML IV SOLN
5.0000 mg | Freq: Once | INTRAVENOUS | Status: AC
  Administered 2022-09-27: 5 mg via INTRAVENOUS
  Filled 2022-09-27: qty 5

## 2022-09-27 MED ORDER — SODIUM CHLORIDE 0.9 % IV SOLN
1.0000 g | INTRAVENOUS | Status: DC
  Filled 2022-09-27: qty 10

## 2022-09-27 MED ORDER — ACETAMINOPHEN 325 MG PO TABS
650.0000 mg | ORAL_TABLET | Freq: Four times a day (QID) | ORAL | Status: DC | PRN
  Administered 2022-09-27 – 2022-09-29 (×2): 650 mg via ORAL
  Filled 2022-09-27 (×2): qty 2

## 2022-09-27 MED ORDER — CHLORHEXIDINE GLUCONATE CLOTH 2 % EX PADS
6.0000 | MEDICATED_PAD | Freq: Every day | CUTANEOUS | Status: DC
  Administered 2022-09-27 – 2022-10-01 (×5): 6 via TOPICAL

## 2022-09-27 MED ORDER — LACTATED RINGERS IV SOLN: INTRAVENOUS | Status: AC

## 2022-09-27 MED ORDER — SODIUM CHLORIDE 0.9 % IV SOLN
2.0000 g | Freq: Once | INTRAVENOUS | Status: AC
  Administered 2022-09-27: 2 g via INTRAVENOUS
  Filled 2022-09-27: qty 12.5

## 2022-09-27 MED ORDER — LACTATED RINGERS IV BOLUS (SEPSIS)
1000.0000 mL | Freq: Once | INTRAVENOUS | Status: AC
  Administered 2022-09-27: 1000 mL via INTRAVENOUS

## 2022-09-27 MED ORDER — POLYETHYLENE GLYCOL 3350 17 G PO PACK
17.0000 g | PACK | Freq: Every day | ORAL | Status: DC | PRN
  Administered 2022-09-29: 17 g via ORAL
  Filled 2022-09-27: qty 1

## 2022-09-27 MED ORDER — FINASTERIDE 5 MG PO TABS
5.0000 mg | ORAL_TABLET | Freq: Every day | ORAL | Status: DC
  Administered 2022-09-28 – 2022-10-01 (×4): 5 mg via ORAL
  Filled 2022-09-27 (×4): qty 1

## 2022-09-27 MED ORDER — ORAL CARE MOUTH RINSE: 15.0000 mL | OROMUCOSAL | Status: DC | PRN

## 2022-09-27 MED ORDER — MAGNESIUM SULFATE 2 GM/50ML IV SOLN
2.0000 g | Freq: Once | INTRAVENOUS | Status: AC
  Administered 2022-09-27: 2 g via INTRAVENOUS
  Filled 2022-09-27: qty 50

## 2022-09-27 MED ORDER — ACETAMINOPHEN 650 MG RE SUPP: 650.0000 mg | Freq: Four times a day (QID) | RECTAL | Status: DC | PRN

## 2022-09-27 MED ORDER — SODIUM CHLORIDE 0.9 % IV SOLN: 2.0000 g | INTRAVENOUS | Status: DC

## 2022-09-27 MED ORDER — ACETAMINOPHEN 650 MG RE SUPP
650.0000 mg | Freq: Once | RECTAL | Status: AC
  Administered 2022-09-27: 650 mg via RECTAL
  Filled 2022-09-27: qty 1

## 2022-09-27 MED ORDER — SODIUM CHLORIDE 0.9 % IV SOLN
2.0000 g | INTRAVENOUS | Status: DC
  Administered 2022-09-28 – 2022-09-30 (×3): 2 g via INTRAVENOUS
  Filled 2022-09-27 (×3): qty 20

## 2022-09-27 NOTE — Progress Notes (Signed)
Pharmacy Antibiotic Note  Benjamin Mason is a 89 y.o. male admitted on 09/27/2022 with UTI.  Pharmacy has been consulted for cefepime dosing.  Maximum temperature 101.7 on 2/13. WBC elevated at 15.1. Scr 2.34 on 2/13 and usually trends between about 1.5 and 2.3 for this patient. Urine and blood cultures pending. Cefepime will be initiated for empiric coverage.  Plan: Initiate cefepime 1 g IV every 24 hours.  Follow-up on culture status. Follow-up on treatment duration and potential de-escalation.  Height: 6' (182.9 cm) Weight: 66.7 kg (147 lb) IBW/kg (Calculated) : 77.6  Temp (24hrs), Avg:99.8 F (37.7 C), Min:98.1 F (36.7 C), Max:101.7 F (38.7 C)  Recent Labs  Lab 09/27/22 1205 09/27/22 1324  WBC 15.1*  --   CREATININE 2.34*  --   LATICACIDVEN 2.0* 1.6    Estimated Creatinine Clearance: 20.2 mL/min (A) (by C-G formula based on SCr of 2.34 mg/dL (H)).    No Known Allergies  Antimicrobials this admission: Cefepime 09/27/22 >>  Microbiology results: 2/13 BCx: pending 2/13 UCx: pending  Thank you for allowing pharmacy to be a part of this patient's care.  Charlett Lango 09/27/2022 2:30 PM

## 2022-09-27 NOTE — ED Triage Notes (Signed)
Facility reports pt diagnosed with UTI yesterday but has not been started on abx yet.

## 2022-09-27 NOTE — Assessment & Plan Note (Addendum)
Meeting sepsis criteria with fever of 101.7, leukocytosis of 15.1, and tachycardia heart rates  100s to 170s.  Lactic acid 2 > 1.6.  No recent urine cultures -IV cefepime started in ED, will continue with IV ceftriaxone 2 g daily -Follow-up blood and urine cultures -1 L bolus given, continue LR 75cc/hr x 20hrs

## 2022-09-27 NOTE — Assessment & Plan Note (Addendum)
AKI on CKD stage IIIb.  Creatinine elevated 2.34, baseline 1.5-1.9.  Acute urinary retention with 1 L of urine in bladder. -Foley - IVF

## 2022-09-27 NOTE — Assessment & Plan Note (Signed)
Hemoglobin 7.3, baseline 8-10.  Stool occult negative.  Denies GI blood loss.  Anemia panel suggest both iron deficiency anemia and anemia of chronic disease. - Trend Hgb -Transfuse for hemoglobin less than 7

## 2022-09-27 NOTE — ED Provider Notes (Signed)
[facility] Provider Note   CSN: [id number] Arrival date & time: 09/27/22  1055     History  Chief Complaint  Patient presents with   Dysuria    Benjamin Mason is a 89 y.o. male.  89 year old male with a history of atrial fibrillation on metoprolol but not on anticoagulation, sick sinus syndrome status post pacemaker, CHF, vascular and Alzheimer's dementia with behavioral disturbances who presents emergency department with fever.  Patient reportedly has been more confused recently and had a urinalysis that was drawn by his facility [facility]) that showed that he had a urinary tract infection yesterday.  This morning had a temperature of 103F prior to arrival and received Tylenol.  Daughter reports that his mentation is at baseline.  Has not yet started antibiotics for the urinary tract infection.  She reports that he also fell in the past few days and is concerned about head injury or pelvic injury because he is having pelvic pain.  Fall was apparently unwitnessed.       Home Medications Prior to Admission medications   Medication Sig Start Date End Date Taking? Authorizing Provider  acetaminophen (TYLENOL) 650 MG CR tablet Take 650 mg by mouth 2 (two) times daily. 03/03/22  Yes [provider]  Cholecalciferol (VITAMIN D3) 25 MCG (1000 UT) CAPS Take 1,000 Units by mouth daily. 04/28/22  Yes [provider]  Cyanocobalamin (B-12) 1000 MCG TABS Take 1 tablet (1,000 mcg total) by mouth daily. 04/20/22  Yes Rondel Jumbo, PA-C  divalproex (DEPAKOTE) 125 MG DR tablet Take 1 tablet (125 mg total) by mouth at bedtime. 07/26/22  Yes Rondel Jumbo, PA-C  finasteride (PROSCAR) 5 MG tablet Take 5 mg by mouth daily.     Yes [provider]  hydrOXYzine (ATARAX) 10 MG tablet Take 10 mg by mouth 2 (two) times daily as needed for anxiety. 08/17/22  Yes [provider]  loratadine (CLARITIN) 10 MG tablet Take 10 mg  by mouth in the morning.   Yes [provider]  metoprolol tartrate (LOPRESSOR) 25 MG tablet Take 1 tablet (25 mg total) by mouth 2 (two) times daily. 07/06/22  Yes Satira Sark, MD  midodrine (PROAMATINE) 5 MG tablet TAKE TWO (2) TABLETS BY MOUTH IN THE MORNING AND ONE TABLET AT BEDTIME 07/11/22  Yes Satira Sark, MD  nitroGLYCERIN (NITROSTAT) 0.4 MG SL tablet DISSOLVE ONE TABLET UNDER TONGUE EVERY 5 MINUTES UP TO 3 DOSES AS NEEDED FOR CHEST PAIN 09/12/22  Yes Satira Sark, MD  Nutritional Supplements (ENSURE HIGH PROTEIN) LIQD Take 237 mLs by mouth 3 (three) times daily after meals. 03/25/22  Yes [provider]  pantoprazole (PROTONIX) 40 MG tablet Take 40 mg by mouth daily.  01/17/19  Yes [provider]  Propylene Glycol (SYSTANE BALANCE) 0.6 % SOLN Place 1 drop into both eyes in the morning and at bedtime.   Yes [provider]  sertraline (ZOLOFT) 50 MG tablet Take 50 mg by mouth every morning. 05/09/22  Yes [provider]  brimonidine (ALPHAGAN) 0.2 % ophthalmic solution Place 1 drop into both eyes 2 (two) times daily. Patient not taking: Reported on 09/27/2022 01/25/22   [provider]  LAGEVRIO 200 MG CAPS capsule SMARTSIG:4 Capsule(s) By Mouth Every 12 Hours Patient not taking: Reported on 09/27/2022 08/26/22   [provider]      Allergies    Patient has no known allergies.    Review of  Systems   Review of Systems  Physical Exam Updated Vital Signs BP 117/79   Pulse 88   Temp 98.7 F (37.1 C) (Oral)   Resp (!) 23   Ht 6' (1.829 m)   Wt 70.7 kg   SpO2 100%   BMI 21.14 kg/m  Physical Exam Constitutional:      General: He is not in acute distress.    Appearance: Normal appearance. He is not ill-appearing.     Comments: Alert and oriented to self only.  At baseline per patient's daughter.  HENT:     Head: Normocephalic and atraumatic.     Right Ear: External ear normal.     Left Ear: External  ear normal.     Mouth/Throat:     Mouth: Mucous membranes are moist.     Pharynx: Oropharynx is clear.  Eyes:     Extraocular Movements: Extraocular movements intact.     Conjunctiva/sclera: Conjunctivae normal.     Pupils: Pupils are equal, round, and reactive to light.  Neck:     Comments: No C-spine midline tenderness to palpation Cardiovascular:     Rate and Rhythm: Tachycardia present. Rhythm irregular.     Pulses: Normal pulses.     Heart sounds: Normal heart sounds.  Pulmonary:     Effort: Pulmonary effort is normal. No respiratory distress.     Breath sounds: Normal breath sounds.  Abdominal:     General: There is no distension.     Palpations: Abdomen is soft. There is mass (Suprapubic).     Tenderness: There is no abdominal tenderness. There is no guarding.  Musculoskeletal:        General: No deformity. Normal range of motion.     Cervical back: No rigidity or tenderness.     Comments: No tenderness to palpation of midline thoracic or lumbar spine.  No step-offs palpated.  No tenderness to palpation of chest wall.  No bruising noted.  No tenderness to palpation of bilateral clavicles.  No tenderness to palpation, bruising, or deformities noted of bilateral elbows, wrists, hips, knees, or ankles.  Sling on left upper extremity from proximal humerus fracture that was previously diagnosed.  Neurological:     General: No focal deficit present.     Mental Status: He is alert. Mental status is at baseline.     Cranial Nerves: No cranial nerve deficit.     ED Results / Procedures / Treatments   Labs (all labs ordered are listed, but only abnormal results are displayed) Labs Reviewed  COMPREHENSIVE METABOLIC PANEL - Abnormal; Notable for the following components:      Result Value   Chloride 112 (*)    CO2 17 (*)    BUN 53 (*)    Creatinine, Ser 2.34 (*)    Calcium 7.2 (*)    Total Protein 5.7 (*)    Albumin 2.2 (*)    GFR, Estimated 26 (*)    All other components  within normal limits  CBC WITH DIFFERENTIAL/PLATELET - Abnormal; Notable for the following components:   WBC 15.1 (*)    RBC 2.16 (*)    Hemoglobin 7.3 (*)    HCT 23.7 (*)    MCV 109.7 (*)    Neutro Abs 13.9 (*)    Lymphs Abs 0.4 (*)    Abs Immature Granulocytes 0.19 (*)    All other components within normal limits  URINALYSIS, ROUTINE W REFLEX MICROSCOPIC - Abnormal; Notable for the following components:   APPearance HAZY (*)  Hgb urine dipstick SMALL (*)    Leukocytes,Ua SMALL (*)    Bacteria, UA MANY (*)    All other components within normal limits  IRON AND TIBC - Abnormal; Notable for the following components:   Iron 10 (*)    TIBC 193 (*)    Saturation Ratios 5 (*)    All other components within normal limits  FERRITIN - Abnormal; Notable for the following components:   Ferritin 746 (*)    All other components within normal limits  LACTIC ACID, PLASMA - Abnormal; Notable for the following components:   Lactic Acid, Venous 2.0 (*)    All other components within normal limits  MAGNESIUM - Abnormal; Notable for the following components:   Magnesium 1.2 (*)    All other components within normal limits  RESP PANEL BY RT-PCR (RSV, FLU A&B, COVID)  RVPGX2  CULTURE, BLOOD (ROUTINE X 2)  CULTURE, BLOOD (ROUTINE X 2)  URINE CULTURE  MRSA NEXT GEN BY PCR, NASAL  LACTIC ACID, PLASMA  BASIC METABOLIC PANEL  CBC  URINALYSIS, W/ REFLEX TO CULTURE (INFECTION SUSPECTED)  MAGNESIUM  POC OCCULT BLOOD, ED  SAMPLE TO BLOOD BANK    EKG EKG Interpretation  Date/Time:  Tuesday September 27 2022 12:19:57 EST Ventricular Rate:  185 PR Interval:    QRS Duration: 85 QT Interval:  280 QTC Calculation: 492 R Axis:   -42 Text Interpretation: Atrial fibrillation with rapid V-rate Low voltage, extremity leads Abnormal R-wave progression, late transition Repolarization abnormality, prob rate related Confirmed by Margaretmary Eddy [phone]) on 09/27/2022 1:12:13 PM  Radiology CT Head Wo  Contrast  Result Date: 09/27/2022 CLINICAL DATA:  Provided history: Head trauma, minor. EXAM: CT HEAD WITHOUT CONTRAST TECHNIQUE: Contiguous axial images were obtained from the base of the skull through the vertex without intravenous contrast. RADIATION DOSE REDUCTION: This exam was performed according to the departmental dose-optimization program which includes automated exposure control, adjustment of the mA and/or kV according to patient size and/or use of iterative reconstruction technique. COMPARISON:  Prior head CT examinations 06/04/2022 and earlier. FINDINGS: Brain: Moderate cerebral atrophy. Small foci of chronic encephalomalacia/gliosis within the inferior frontal lobes bilaterally, at sites of prior parenchymal contusions. Moderate patchy and ill-defined hypoattenuation within the cerebral white matter, nonspecific but compatible with chronic small ischemic disease. There is no acute intracranial hemorrhage. No demarcated cortical infarct. No extra-axial fluid collection. No evidence of an intracranial mass. No midline shift. Vascular: No hyperdense vessel.  Atherosclerotic calcifications. Skull: Known non-acute, nondisplaced fracture of the parietooccipital calvarium. Sinuses/Orbits: No mass or acute finding within the imaged orbits. Small volume frothy secretions within a posterior left ethmoid air cell. IMPRESSION: 1.  No evidence of an acute intracranial abnormality. 2. Small foci of chronic encephalomalacia/gliosis within the inferior temporal lobes, bilaterally (at sites of prior parenchymal contusions). 3. Moderate chronic small vessel ischemic changes within the cerebral white matter. 4. Moderate cerebral atrophy. 5. Known non-acute, nondisplaced fracture of the parietooccipital calvarium. 6. Mild left ethmoid sinusitis. Electronically Signed   By: Kellie Simmering D.O.   On: 09/27/2022 14:20   CT PELVIS WO CONTRAST  Result Date: 09/27/2022 CLINICAL DATA:  Status post fall, hip pain EXAM: CT  PELVIS WITHOUT CONTRAST TECHNIQUE: Multidetector CT imaging of the pelvis was performed following the standard protocol without intravenous contrast. RADIATION DOSE REDUCTION: This exam was performed according to the departmental dose-optimization program which includes automated exposure control, adjustment of the mA and/or kV according to patient size and/or use of iterative reconstruction  technique. COMPARISON:  Pelvic radiograph dated 03/01/2022 FINDINGS: Urinary Tract: Bladder is decompressed by an indwelling Foley catheter. Bowel:  Sigmoid diverticulosis, without evidence of diverticulitis. Vascular/Lymphatic: No evidence of aneurysm. Atherosclerotic calcifications the abdominal aorta and branch vessels. No suspicious pelvic lymphadenopathy. Reproductive: Prostate is unremarkable. Penile prosthesis with reservoir in the right anterior lower pelvis (series 7/image 80). Other:  No pelvic ascites. Musculoskeletal: Status post ORIF of the right hip, without evidence of complication. Visualized bony pelvis appears intact. Moderate degenerative changes of the left hip. Degenerative changes of the lower lumbar spine, most prominent at L4-5. IMPRESSION: Status post ORIF of the right hip, without evidence of complication. No fracture or dislocation is seen. Additional ancillary findings as above. Aortic Atherosclerosis (ICD10-I70.0). Electronically Signed   By: Julian Hy M.D.   On: 09/27/2022 14:20   CT Cervical Spine Wo Contrast  Result Date: 09/27/2022 CLINICAL DATA:  Neck pain after falling. Recent urinary tract infection diagnosis. EXAM: CT CERVICAL SPINE WITHOUT CONTRAST TECHNIQUE: Multidetector CT imaging of the cervical spine was performed without intravenous contrast. Multiplanar CT image reconstructions were also generated. RADIATION DOSE REDUCTION: This exam was performed according to the departmental dose-optimization program which includes automated exposure control, adjustment of the mA and/or  kV according to patient size and/or use of iterative reconstruction technique. COMPARISON:  Cervical spine CT 06/02/2022. FINDINGS: Despite efforts by the technologist and patient, mild motion artifact is present on today's exam and could not be eliminated. This reduces exam sensitivity and specificity. Alignment: Straightening without focal angulation or significant listhesis. Skull base and vertebrae: No evidence of acute fracture or traumatic subluxation. There is multilevel cervical spondylosis with disc space narrowing, uncinate spurring and facet hypertrophy. Ossification of the ligamentum nuchae noted. Soft tissues and spinal canal: No prevertebral fluid or swelling. No visible canal hematoma. Disc levels: Multilevel spondylosis with disc space narrowing, uncinate spurring and facet hypertrophy, similar to previous study. Resulting chronic mild to moderate foraminal narrowing at multiple levels. No large disc herniation identified. Upper chest: Emphysematous changes at the lung apices. Other: Bilateral carotid atherosclerosis. IMPRESSION: 1. No evidence of acute cervical spine fracture, traumatic subluxation or static signs of instability. 2. Multilevel cervical spondylosis, similar to previous study. Electronically Signed   By: Richardean Sale M.D.   On: 09/27/2022 14:15   DG Chest Port 1 View  Result Date: 09/27/2022 CLINICAL DATA:  Possible sepsis. EXAM: PORTABLE CHEST 1 VIEW COMPARISON:  Chest radiograph 03/01/2022. FINDINGS: The left chest wall cardiac device is stable. The cardiomediastinal silhouette is stable. There is no focal consolidation or pulmonary edema. There is no pleural effusion or pneumothorax There is possible prior fracture of the left clavicle. There is a left proximal humeral fracture which was acute on the radiographs from 08/26/2022. There is no new acute osseous abnormality. IMPRESSION: No radiographic evidence of acute cardiopulmonary process. Electronically Signed   By: Valetta Mole M.D.   On: 09/27/2022 13:26    Procedures Procedures   Medications Ordered in ED Medications  diltiazem (CARDIZEM) 125 mg in dextrose 5% 125 mL (1 mg/mL) infusion (15 mg/hr Intravenous Infusion Verify 09/27/22 1740)  Chlorhexidine Gluconate Cloth 2 % PADS 6 each (6 each Topical Given 09/27/22 1655)  divalproex (DEPAKOTE) DR tablet 125 mg (has no administration in time range)  finasteride (PROSCAR) tablet 5 mg (has no administration in time range)  metoprolol tartrate (LOPRESSOR) tablet 25 mg (has no administration in time range)  midodrine (PROAMATINE) tablet 10 mg (10 mg Oral Patient Refused/Not Given 09/27/22  1740)  pantoprazole (PROTONIX) EC tablet 40 mg (has no administration in time range)  lactated ringers infusion ( Intravenous Infusion Verify 09/27/22 1740)  acetaminophen (TYLENOL) tablet 650 mg (has no administration in time range)    Or  acetaminophen (TYLENOL) suppository 650 mg (has no administration in time range)  polyethylene glycol (MIRALAX / GLYCOLAX) packet 17 g (has no administration in time range)  Oral care mouth rinse (has no administration in time range)  cefTRIAXone (ROCEPHIN) 2 g in sodium chloride 0.9 % 100 mL IVPB (has no administration in time range)  lactated ringers bolus 1,000 mL (0 mLs Intravenous Stopped 09/27/22 1330)  ceFEPIme (MAXIPIME) 2 g in sodium chloride 0.9 % 100 mL IVPB (0 g Intravenous Stopped 09/27/22 1338)  metoprolol tartrate (LOPRESSOR) injection 5 mg (5 mg Intravenous Given 09/27/22 1333)  acetaminophen (TYLENOL) suppository 650 mg (650 mg Rectal Given 09/27/22 1330)  magnesium sulfate IVPB 2 g 50 mL (0 g Intravenous Stopped 09/27/22 1519)    ED Course/ Medical Decision Making/ A&P Clinical Course as of 09/27/22 1855  Tue Sep 27, 2022  1534 Dr Denton Brick from hospitalist will admit.  [RP]    Clinical Course User Index [RP] Fransico Meadow, MD                            Medical Decision Making Amount and/or Complexity of Data  Reviewed Labs: ordered. Radiology: ordered.  Risk OTC drugs. Prescription drug management. Decision regarding hospitalization.   ENSAR AUNGST is a 89 y.o. male with comorbidities that complicate the patient evaluation including atrial fibrillation on metoprolol but not on anticoagulation, sick sinus syndrome status post pacemaker, CHF, vascular and Alzheimer's dementia with behavioral disturbances who presents emergency department with fever, tachycardia, and suprapubic fullness  Initial Ddx:  Sepsis, urinary tract infection, atrial fibrillation with RVR, urinary retention, traumatic injury (ICH, pelvic fracture, C-spine injury)  MDM:  Patient has been in atrial fibrillation with RVR at this time.  Since he is on metoprolol at home we will give several doses to him for rate control.  Will also check his electrolytes.  Feel that he is likely septic due to urinary tract infection so will initiate on antibiotics and since he is likely been on antibiotics recently will treat him with cefepime at this time.  Will also give him a fluid bolus for this.  Patient be retaining urine due to suprapubic fullness will obtain bladder scan and catheterize if necessary.  Will also obtain CT scans to rule out any traumatic injuries that he could have incurred when he fell.  Plan:  Labs Urinalysis Blood cultures Lactate Electrolytes IV cefepime CT head, C-spine, pelvis  ED Summary/Re-evaluation:  Patient underwent the above workup which showed that he did have a urinary tract infection.  Was later found to be febrile.  Lactic acid WNL.  Heart rate did improve some with IV metoprolol but remained persistently tachycardic in the 120s so was eventually started on a diltiazem drip.  Patient was found to be newly anemic with a hemoglobin of 7.3 down from his baseline of 10.  Hemoccult in the emergency department was negative.  Not reporting any GI bleeding.  Type and screen was sent to the lab.  CT scans  without evidence of traumatic injury and so patient was admitted to hospitalist for management of his urosepsis and atrial fibrillation with RVR.  Foley was placed for urinary retention of over 1 L.  This  patient presents to the ED for concern of complaints listed in HPI, this involves an extensive number of treatment options, and is a complaint that carries with it a high risk of complications and morbidity. Disposition including potential need for admission considered.   Dispo: Admit to Step Down  Additional history obtained from daughter Records reviewed Outpatient Clinic Notes The following labs were independently interpreted: Chemistry, CBC, and Urinalysis and show acute anemia, AKI, and urinary tract infection I independently reviewed the following imaging with scope of interpretation limited to determining acute life threatening conditions related to emergency care: Chest x-ray and agree with the radiologist interpretation with the following exceptions: None I personally reviewed and interpreted cardiac monitoring: atrial fibrillation with RVR I personally reviewed and interpreted the pt's EKG: see above for interpretation  I have reviewed the patients home medications and made adjustments as needed Consults: Hospitalist Social Determinants of health:  Elderly nursing home resident  Final Clinical Impression(s) / ED Diagnoses Final diagnoses:  Lower urinary tract infectious disease  Sepsis, due to unspecified organism, unspecified whether acute organ dysfunction present ([place])  Atrial fibrillation with RVR ([place])  Urinary retention    Rx / DC Orders ED Discharge Orders     None      CRITICAL CARE Performed by: Fransico Meadow   Total critical care time: 45 minutes  Critical care time was exclusive of separately billable procedures and treating other patients.  Critical care was necessary to treat or prevent imminent or life-threatening deterioration.  Critical care  was time spent personally by me on the following activities: development of treatment plan with patient and/or surrogate as well as nursing, discussions with consultants, evaluation of patient's response to treatment, examination of patient, obtaining history from patient or surrogate, ordering and performing treatments and interventions, ordering and review of laboratory studies, ordering and review of radiographic studies, pulse oximetry and re-evaluation of patient's condition.    Fransico Meadow, MD 09/27/22 [phone]

## 2022-09-27 NOTE — Assessment & Plan Note (Addendum)
Per daughter status is currently at baseline.  He is alert and oriented to person.  He resides at [facility] facility in the memory unit. -Resume Depakote, -Hold Zoloft for now with QT prolongation of 492

## 2022-09-27 NOTE — Assessment & Plan Note (Signed)
[place] a few days ago, head cervical CT negative for acute abnormality, complains of pelvic pain, but CT pelvis negative for fracture.-Sling to left shoulder from fracture of humeral neck 08/2022.

## 2022-09-27 NOTE — Assessment & Plan Note (Signed)
Mag 1.2.  Replete.  Potassium 4.

## 2022-09-27 NOTE — Assessment & Plan Note (Signed)
Soft to stable. -Resume midodrine 10 mg twice daily -Currently on Cardizem drip -Resume metoprolol 25 mg twice daily -Wean off Cardizem drip as able

## 2022-09-27 NOTE — H&P (Signed)
History and Physical    Benjamin Mason [id number] DOB: [birth year] DOA: 09/27/2022  PCP: Pcp, No   Patient coming from: [facility]  I have personally briefly reviewed patient's old medical records in [place]  Chief Complaint: Fever  HPI: Benjamin Mason is a 89 y.o. male with medical history significant for vascular dementia, atrial fibrillation, SSS with pacemaker, hypertension, coronary artery disease, CKD 3A, Subural hematoma. Patient presented to the ED from [facility] memory unit reports of fever and altered mental status.  At the time of my evaluation, patient is awake, alert and oriented to person, daughter Maudie Mercury who is HCPOA is at bedside and reports that patient's mental status is at his baseline. Had fever up to 103 at the nursing home.  He was diagnosed with a UTI yesterday but has not been started on antibiotics yet.  He has a history of frequent falls, he fell a few days ago after which he has been complaining of pelvic pain.  She also reports generalized weakness and thinks this may be contributing to his more recent falls.  No difficulty breathing or chest pain noted.  No black stools no blood in stools no vomiting of blood.  He is no longer on anticoagulation due to dural hematoma 05/2022.  ED Course: Febrile to 101.7.  Tachycardic heart rates up to 170s.  Respiratory rate 16-22.  Blood pressure systolic [id number].  O2 sats greater than 94% on room air. Leukocytosis of 15.1.  Creatinine elevated 2.34.  Lactic acid 2.  Magnesium 1.2.  Stool occult negative. 1 L bolus given. IV cefepime started.  Metoprolol 5 mg given x 3 without improvement in heart rate, subsequently Cardizem drip started.   Review of Systems: As per HPI all other systems reviewed and negative.  Past Medical History:  Diagnosis Date   Anxiety    Aortic regurgitation    Mild to moderate   CKD (chronic kidney disease) stage 3, GFR 30-59 ml/min (HCC)    Coronary atherosclerosis of  native coronary artery    BMS to RCA and LAD 1995   Essential hypertension    Frequent PVCs    IBS (irritable bowel syndrome)    Lactose intolerance    Mitral regurgitation    Moderate   Mixed hyperlipidemia    Permanent atrial fibrillation [facility])    Sick sinus syndrome [facility])    [employer] Jude single-chamber pacemaker May 2021 - Dr. Caryl Comes   Skin cancer    Skull fracture [facility])     Past Surgical History:  Procedure Laterality Date   BALLOON DILATION  08/03/2012   Procedure: BALLOON DILATION;  Surgeon: Rogene Houston, MD;  [facility];  Service: Endoscopy;  Laterality: N/A;   COLONOSCOPY WITH ESOPHAGOGASTRODUODENOSCOPY (EGD)  08/03/2012   Procedure: COLONOSCOPY WITH ESOPHAGOGASTRODUODENOSCOPY (EGD);  Surgeon: Rogene Houston, MD;  [facility];  Service: Endoscopy;  Laterality: N/A;  215   LEFT HEART CATH AND CORONARY ANGIOGRAPHY N/A 11/18/2016   Procedure: Left Heart Cath and Coronary Angiography;  Surgeon: Peter M Martinique, MD;  Location: [facility];  Service: Cardiovascular;  Laterality: N/A;   LEFT INGUINAL HERNIA     MALONEY DILATION  08/03/2012   Procedure: MALONEY DILATION;  Surgeon: Rogene Houston, MD;  [facility];  Service: Endoscopy;  Laterality: N/A;   PACEMAKER IMPLANT N/A 12/30/2019   Procedure: PACEMAKER IMPLANT;  Surgeon: Deboraha Sprang, MD;  Location: [facility];  Service: Cardiovascular;  Laterality:  N/A;   PENILE PROSTHESIS IMPLANT     SAVORY DILATION  08/03/2012   Procedure: SAVORY DILATION;  Surgeon: Rogene Houston, MD;  [facility];  Service: Endoscopy;  Laterality: N/A;     reports that he quit smoking about 60 years ago. His smoking use included cigarettes. He has a 50.00 pack-year smoking history. He has never used smokeless tobacco. He reports that he does not drink alcohol and does not use drugs.  No Known Allergies  Family History  Problem Relation Age of Onset   Heart attack Father        in his  early 43's    Prior to Admission medications   Medication Sig Start Date End Date Taking? Authorizing Provider  acetaminophen (TYLENOL) 650 MG CR tablet Take 650 mg by mouth 2 (two) times daily. 03/03/22  Yes [provider]  Cholecalciferol (VITAMIN D3) 25 MCG (1000 UT) CAPS Take 1,000 Units by mouth daily. 04/28/22  Yes [provider]  Cyanocobalamin (B-12) 1000 MCG TABS Take 1 tablet (1,000 mcg total) by mouth daily. 04/20/22  Yes Rondel Jumbo, PA-C  divalproex (DEPAKOTE) 125 MG DR tablet Take 1 tablet (125 mg total) by mouth at bedtime. 07/26/22  Yes Rondel Jumbo, PA-C  finasteride (PROSCAR) 5 MG tablet Take 5 mg by mouth daily.     Yes [provider]  hydrOXYzine (ATARAX) 10 MG tablet Take 10 mg by mouth 2 (two) times daily as needed for anxiety. 08/17/22  Yes [provider]  loratadine (CLARITIN) 10 MG tablet Take 10 mg by mouth in the morning.   Yes [provider]  metoprolol tartrate (LOPRESSOR) 25 MG tablet Take 1 tablet (25 mg total) by mouth 2 (two) times daily. 07/06/22  Yes Satira Sark, MD  midodrine (PROAMATINE) 5 MG tablet TAKE TWO (2) TABLETS BY MOUTH IN THE MORNING AND ONE TABLET AT BEDTIME 07/11/22  Yes Satira Sark, MD  nitroGLYCERIN (NITROSTAT) 0.4 MG SL tablet DISSOLVE ONE TABLET UNDER TONGUE EVERY 5 MINUTES UP TO 3 DOSES AS NEEDED FOR CHEST PAIN 09/12/22  Yes Satira Sark, MD  Nutritional Supplements (ENSURE HIGH PROTEIN) LIQD Take 237 mLs by mouth 3 (three) times daily after meals. 03/25/22  Yes [provider]  pantoprazole (PROTONIX) 40 MG tablet Take 40 mg by mouth daily.  01/17/19  Yes [provider]  Propylene Glycol (SYSTANE BALANCE) 0.6 % SOLN Place 1 drop into both eyes in the morning and at bedtime.   Yes [provider]  sertraline (ZOLOFT) 50 MG tablet Take 50 mg by mouth every morning. 05/09/22  Yes [provider]  brimonidine (ALPHAGAN) 0.2 % ophthalmic solution  Place 1 drop into both eyes 2 (two) times daily. Patient not taking: Reported on 09/27/2022 01/25/22   [provider]  LAGEVRIO 200 MG CAPS capsule SMARTSIG:4 Capsule(s) By Mouth Every 12 Hours Patient not taking: Reported on 09/27/2022 08/26/22   [provider]    Physical Exam: Vitals:   09/27/22 1415 09/27/22 1445 09/27/22 1500 09/27/22 1515  BP: 95/80 115/83  104/80  Pulse:      Resp: (!) 22 15  16  $ Temp:      TempSrc:      SpO2: 94%  96%   Weight:      Height:        Constitutional: NAD, calm, comfortable Vitals:   09/27/22 1415 09/27/22 1445 09/27/22 1500 09/27/22 1515  BP: 95/80 115/83  104/80  Pulse:  Resp: (!) 22 15  16  $ Temp:      TempSrc:      SpO2: 94%  96%   Weight:      Height:       Eyes: PERRL, lids and conjunctivae normal ENMT: Mucous membranes are very dry.   Neck: normal, supple, no masses, no thyromegaly Respiratory: clear to auscultation bilaterally, no wheezing, no crackles. Normal respiratory effort. No accessory muscle use.  Cardiovascular: Tachycardic, irregular rate and rhythm, no murmurs / rubs / gallops. No extremity edema.  Extremities warm..  Abdomen: no tenderness, no masses palpated. No hepatosplenomegaly.  Musculoskeletal: Sling to left shoulder from fracture of left humeral neck.  No clubbing / cyanosis.  Skin: no rashes, lesions, ulcers. No induration Neurologic: No apparent cranial nerve abnormality, exam limited due to baseline dementia, moving all extremities-able to grip with bilateral upper extremities, at least 3/5 strength of bilateral lower extremities Psychiatric: Alert and oriented to person only.   Labs on Admission: I have personally reviewed following labs and imaging studies  CBC: Recent Labs  Lab 09/27/22 1205  WBC 15.1*  NEUTROABS 13.9*  HGB 7.3*  HCT 23.7*  MCV 109.7*  PLT [id number]   Basic Metabolic Panel: Recent Labs  Lab 09/27/22 1205  NA 140  K 4.0  CL 112*  CO2 17*  GLUCOSE 93  BUN  53*  CREATININE 2.34*  CALCIUM 7.2*  MG 1.2*   GFR: Estimated Creatinine Clearance: 20.2 mL/min (A) (by C-G formula based on SCr of 2.34 mg/dL (H)). Liver Function Tests: Recent Labs  Lab 09/27/22 1205  AST 26  ALT 16  ALKPHOS 58  BILITOT 0.7  PROT 5.7*  ALBUMIN 2.2*   Anemia Panel: Recent Labs    09/27/22 1205  FERRITIN 746*  TIBC 193*  IRON 10*   Urine analysis:    Component Value Date/Time   COLORURINE YELLOW 09/27/2022 1245   APPEARANCEUR HAZY (A) 09/27/2022 1245   LABSPEC 1.013 09/27/2022 1245   PHURINE 5.0 09/27/2022 1245   GLUCOSEU NEGATIVE 09/27/2022 1245   HGBUR SMALL (A) 09/27/2022 1245   BILIRUBINUR NEGATIVE 09/27/2022 1245   KETONESUR NEGATIVE 09/27/2022 1245   PROTEINUR NEGATIVE 09/27/2022 1245   NITRITE NEGATIVE 09/27/2022 1245   LEUKOCYTESUR SMALL (A) 09/27/2022 1245    Radiological Exams on Admission: CT Head Wo Contrast  Result Date: 09/27/2022 CLINICAL DATA:  Provided history: Head trauma, minor. EXAM: CT HEAD WITHOUT CONTRAST TECHNIQUE: Contiguous axial images were obtained from the base of the skull through the vertex without intravenous contrast. RADIATION DOSE REDUCTION: This exam was performed according to the departmental dose-optimization program which includes automated exposure control, adjustment of the mA and/or kV according to patient size and/or use of iterative reconstruction technique. COMPARISON:  Prior head CT examinations 06/04/2022 and earlier. FINDINGS: Brain: Moderate cerebral atrophy. Small foci of chronic encephalomalacia/gliosis within the inferior frontal lobes bilaterally, at sites of prior parenchymal contusions. Moderate patchy and ill-defined hypoattenuation within the cerebral white matter, nonspecific but compatible with chronic small ischemic disease. There is no acute intracranial hemorrhage. No demarcated cortical infarct. No extra-axial fluid collection. No evidence of an intracranial mass. No midline shift. Vascular:  No hyperdense vessel.  Atherosclerotic calcifications. Skull: Known non-acute, nondisplaced fracture of the parietooccipital calvarium. Sinuses/Orbits: No mass or acute finding within the imaged orbits. Small volume frothy secretions within a posterior left ethmoid air cell. IMPRESSION: 1.  No evidence of an acute intracranial abnormality. 2. Small foci of chronic encephalomalacia/gliosis within the inferior temporal lobes, bilaterally (  at sites of prior parenchymal contusions). 3. Moderate chronic small vessel ischemic changes within the cerebral white matter. 4. Moderate cerebral atrophy. 5. Known non-acute, nondisplaced fracture of the parietooccipital calvarium. 6. Mild left ethmoid sinusitis. Electronically Signed   By: Kellie Simmering D.O.   On: 09/27/2022 14:20   CT PELVIS WO CONTRAST  Result Date: 09/27/2022 CLINICAL DATA:  Status post fall, hip pain EXAM: CT PELVIS WITHOUT CONTRAST TECHNIQUE: Multidetector CT imaging of the pelvis was performed following the standard protocol without intravenous contrast. RADIATION DOSE REDUCTION: This exam was performed according to the departmental dose-optimization program which includes automated exposure control, adjustment of the mA and/or kV according to patient size and/or use of iterative reconstruction technique. COMPARISON:  Pelvic radiograph dated 03/01/2022 FINDINGS: Urinary Tract: Bladder is decompressed by an indwelling Foley catheter. Bowel:  Sigmoid diverticulosis, without evidence of diverticulitis. Vascular/Lymphatic: No evidence of aneurysm. Atherosclerotic calcifications the abdominal aorta and branch vessels. No suspicious pelvic lymphadenopathy. Reproductive: Prostate is unremarkable. Penile prosthesis with reservoir in the right anterior lower pelvis (series 7/image 80). Other:  No pelvic ascites. Musculoskeletal: Status post ORIF of the right hip, without evidence of complication. Visualized bony pelvis appears intact. Moderate degenerative changes  of the left hip. Degenerative changes of the lower lumbar spine, most prominent at L4-5. IMPRESSION: Status post ORIF of the right hip, without evidence of complication. No fracture or dislocation is seen. Additional ancillary findings as above. Aortic Atherosclerosis (ICD10-I70.0). Electronically Signed   By: Julian Hy M.D.   On: 09/27/2022 14:20   CT Cervical Spine Wo Contrast  Result Date: 09/27/2022 CLINICAL DATA:  Neck pain after falling. Recent urinary tract infection diagnosis. EXAM: CT CERVICAL SPINE WITHOUT CONTRAST TECHNIQUE: Multidetector CT imaging of the cervical spine was performed without intravenous contrast. Multiplanar CT image reconstructions were also generated. RADIATION DOSE REDUCTION: This exam was performed according to the departmental dose-optimization program which includes automated exposure control, adjustment of the mA and/or kV according to patient size and/or use of iterative reconstruction technique. COMPARISON:  Cervical spine CT 06/02/2022. FINDINGS: Despite efforts by the technologist and patient, mild motion artifact is present on today's exam and could not be eliminated. This reduces exam sensitivity and specificity. Alignment: Straightening without focal angulation or significant listhesis. Skull base and vertebrae: No evidence of acute fracture or traumatic subluxation. There is multilevel cervical spondylosis with disc space narrowing, uncinate spurring and facet hypertrophy. Ossification of the ligamentum nuchae noted. Soft tissues and spinal canal: No prevertebral fluid or swelling. No visible canal hematoma. Disc levels: Multilevel spondylosis with disc space narrowing, uncinate spurring and facet hypertrophy, similar to previous study. Resulting chronic mild to moderate foraminal narrowing at multiple levels. No large disc herniation identified. Upper chest: Emphysematous changes at the lung apices. Other: Bilateral carotid atherosclerosis. IMPRESSION: 1. No  evidence of acute cervical spine fracture, traumatic subluxation or static signs of instability. 2. Multilevel cervical spondylosis, similar to previous study. Electronically Signed   By: Richardean Sale M.D.   On: 09/27/2022 14:15   DG Chest Port 1 View  Result Date: 09/27/2022 CLINICAL DATA:  Possible sepsis. EXAM: PORTABLE CHEST 1 VIEW COMPARISON:  Chest radiograph 03/01/2022. FINDINGS: The left chest wall cardiac device is stable. The cardiomediastinal silhouette is stable. There is no focal consolidation or pulmonary edema. There is no pleural effusion or pneumothorax There is possible prior fracture of the left clavicle. There is a left proximal humeral fracture which was acute on the radiographs from 08/26/2022. There is no new acute  osseous abnormality. IMPRESSION: No radiographic evidence of acute cardiopulmonary process. Electronically Signed   By: Valetta Mole M.D.   On: 09/27/2022 13:26    EKG: Independently reviewed.  Atrial fibrillation rate 185, QTc 492.  No significant ST or T wave changes from prior.  Assessment/Plan Principal Problem:   Atrial fibrillation with RVR (HCC) Active Problems:   Sepsis due to urinary tract infection (HCC)   Acute on chronic anemia   Acute kidney injury superimposed on chronic kidney disease (HCC)   Essential hypertension, benign   HTN (hypertension)   Fall   Hypomagnesemia   Vascular dementia ([place])   Depression   Acute urinary retention  Assessment and Plan: * Atrial fibrillation with RVR ([place]) Heart rates up to 170s.  Currently controlled in the low 100s.  Likely driven by sepsis, in the setting of hypomagnesemia history of atrial fibrillation not on anticoagulation, Eliquis was discontinued after subdural hematoma 05/2022, Eliquis had to be reversed.  No improvement in heart rates with IV metoprolol 5 mg.  Last echo 2020- EF 55 to 60%. -Continue Cardizem drip -Correct hypomagnesemia  Acute on chronic anemia Hemoglobin 7.3, baseline 8-10.   Stool occult negative.  Denies GI blood loss.  Anemia panel suggest both iron deficiency anemia and anemia of chronic disease. - Trend Hgb -Transfuse for hemoglobin less than 7  Sepsis due to urinary tract infection ([place]) Meeting sepsis criteria with fever of 101.7, leukocytosis of 15.1, and tachycardia heart rates  100s to 170s.  Lactic acid 2 > 1.6.  No recent urine cultures -IV cefepime started in ED, will continue with IV ceftriaxone 2 g daily -Follow-up blood and urine cultures -1 L bolus given, continue LR 75cc/hr x 20hrs  Acute kidney injury superimposed on chronic kidney disease ([place]) AKI on CKD stage IIIb.  Creatinine elevated 2.34, baseline 1.5-1.9.  Acute urinary retention with 1 L of urine in bladder. -Foley - IVF  Acute urinary retention 1 L of urine drained from bladder.   -Foley catheter inserted in ED.  Vascular dementia [facility]) Per daughter status is currently at baseline.  He is alert and oriented to person.  He resides at [place] facility in the memory unit. -Resume Depakote, -Hold Zoloft for now with QT prolongation of 492  Hypomagnesemia Mag 1.2.  Replete.  Potassium 4.  Fall [place] a few days ago, head cervical CT negative for acute abnormality, complains of pelvic pain, but CT pelvis negative for fracture.-Sling to left shoulder from fracture of humeral neck 08/2022.   Essential hypertension, benign Soft to stable. -Resume midodrine 10 mg twice daily -Currently on Cardizem drip -Resume metoprolol 25 mg twice daily -Wean off Cardizem drip as able    DVT prophylaxis: SCDS Code Status: DNR-patient's daughter Maudie Mercury at bedside who is HCPOA.  Patient has been made DNR.  Requests Universal DNR form on discharge Family Communication:  Daughter Maudie Mercury at bedside. Disposition Plan: > 2 days Consults called: None  Admission status: Inpt stepdwon I certify that at the point of admission it is my clinical judgment that the patient will require inpatient hospital care  spanning beyond 2 midnights from the point of admission due to high intensity of service, high risk for further deterioration and high frequency of surveillance required.   Author: Bethena Roys, MD 09/27/2022 5:51 PM  For on call review [email or web address].

## 2022-09-27 NOTE — Assessment & Plan Note (Signed)
1 L of urine drained from bladder.   -Foley catheter inserted in ED.

## 2022-09-27 NOTE — Assessment & Plan Note (Addendum)
Heart rates up to 170s.  Currently controlled in the low 100s.  Likely driven by sepsis, in the setting of hypomagnesemia history of atrial fibrillation not on anticoagulation, Eliquis was discontinued after subdural hematoma 05/2022, Eliquis had to be reversed.  No improvement in heart rates with IV metoprolol 5 mg.  Last echo 2020- EF 55 to 60%. -Continue Cardizem drip -Correct hypomagnesemia

## 2022-09-28 ENCOUNTER — Encounter (HOSPITAL_COMMUNITY): Payer: Self-pay | Admitting: Internal Medicine

## 2022-09-28 DIAGNOSIS — Z515 Encounter for palliative care: Secondary | ICD-10-CM | POA: Diagnosis not present

## 2022-09-28 DIAGNOSIS — N390 Urinary tract infection, site not specified: Secondary | ICD-10-CM | POA: Diagnosis not present

## 2022-09-28 DIAGNOSIS — Z7189 Other specified counseling: Secondary | ICD-10-CM | POA: Diagnosis not present

## 2022-09-28 DIAGNOSIS — I4891 Unspecified atrial fibrillation: Secondary | ICD-10-CM | POA: Diagnosis not present

## 2022-09-28 LAB — CBC
HCT: 23.8 % — ABNORMAL LOW (ref 39.0–52.0)
Hemoglobin: 7.2 g/dL — ABNORMAL LOW (ref 13.0–17.0)
MCH: 33 pg (ref 26.0–34.0)
MCHC: 30.3 g/dL (ref 30.0–36.0)
MCV: 109.2 fL — ABNORMAL HIGH (ref 80.0–100.0)
Platelets: 182 10*3/uL (ref 150–400)
RBC: 2.18 MIL/uL — ABNORMAL LOW (ref 4.22–5.81)
RDW: 14 % (ref 11.5–15.5)
WBC: 13.3 10*3/uL — ABNORMAL HIGH (ref 4.0–10.5)
nRBC: 0 % (ref 0.0–0.2)

## 2022-09-28 LAB — BASIC METABOLIC PANEL
Anion gap: 9 (ref 5–15)
BUN: 60 mg/dL — ABNORMAL HIGH (ref 8–23)
CO2: 21 mmol/L — ABNORMAL LOW (ref 22–32)
Calcium: 8.5 mg/dL — ABNORMAL LOW (ref 8.9–10.3)
Chloride: 109 mmol/L (ref 98–111)
Creatinine, Ser: 2.47 mg/dL — ABNORMAL HIGH (ref 0.61–1.24)
GFR, Estimated: 24 mL/min — ABNORMAL LOW (ref >60)
Glucose, Bld: 105 mg/dL — ABNORMAL HIGH (ref 70–99)
Potassium: 4.5 mmol/L (ref 3.5–5.1)
Sodium: 139 mmol/L (ref 135–145)

## 2022-09-28 LAB — MAGNESIUM: Magnesium: 1.9 mg/dL (ref 1.7–2.4)

## 2022-09-28 MED ORDER — DILTIAZEM HCL 30 MG PO TABS
30.0000 mg | ORAL_TABLET | Freq: Two times a day (BID) | ORAL | Status: DC
  Administered 2022-09-28 – 2022-09-30 (×5): 30 mg via ORAL
  Filled 2022-09-28 (×5): qty 1

## 2022-09-28 MED ORDER — SODIUM CHLORIDE 0.9 % IV SOLN
250.0000 mg | Freq: Every day | INTRAVENOUS | Status: AC
  Administered 2022-09-28 – 2022-09-29 (×2): 250 mg via INTRAVENOUS
  Filled 2022-09-28 (×2): qty 20

## 2022-09-28 NOTE — Progress Notes (Signed)
Diltiazem drip reviewed with provider, stopped at 0424, titrated during noc with ongoing rhythm monitoring.  Pt has been in controlled AFIB with rate 80s/90s, occasional PVCs.  Pt denies CP/SHOB.  LS diminished, resps even/unlabored.  No edema present.  While pt sleeping, sats briefly dropping into upper 80s for a few seconds at a time, then back to >94%.  Pt placed on O2 at 2L this am to maintain sats while sleeping.  RAC IV infiltrated, IV removed, warm compress applied.  Tylenol given x 1 for left arm discomfort prior to sleep.  Sling in place.  Repositioned for comfort.  Foley patent, cloudy amber urine.  No BM this shift.  Tolerated po fluids, takes pills whole, one at a time.  Daughter at bedside.

## 2022-09-28 NOTE — Progress Notes (Signed)
PROGRESS NOTE    Benjamin Mason  [id number] DOB: 1930-11-06 DOA: 09/27/2022 PCP: Merryl Hacker, No   Brief Narrative:   Benjamin Mason is a 89 y.o. male with medical history significant for vascular dementia, atrial fibrillation, SSS with pacemaker, hypertension, coronary artery disease, CKD 3A, Subural hematoma. Patient presented to the ED from [facility] unit reports of fever and altered mental status.  He was admitted with atrial fibrillation with RVR in the setting of sepsis with UTI.  Assessment & Plan:   Principal Problem:   Atrial fibrillation with RVR (HCC) Active Problems:   Sepsis due to urinary tract infection (HCC)   Acute on chronic anemia   Acute kidney injury superimposed on chronic kidney disease (HCC)   Essential hypertension, benign   HTN (hypertension)   Fall   Hypomagnesemia   Vascular dementia ([place])   Depression   Acute urinary retention  Assessment and Plan:   Atrial fibrillation with RVR-resolved Heart rates up to 170s.  Currently controlled in the low 100s.  Likely driven by sepsis, in the setting of hypomagnesemia history of atrial fibrillation not on anticoagulation, Eliquis was discontinued after subdural hematoma 05/2022, Eliquis had to be reversed.  No improvement in heart rates with IV metoprolol 5 mg.  Last echo 2020- EF 55 to 60%. -Continue home metoprolol and start oral Cardizem 30 mg twice daily for now -Okay for transfer to telemetry as he has been weaned off of drip   Acute on chronic anemia Hemoglobin 7.3, baseline 8-10.  Stool occult negative.  Denies GI blood loss.  Anemia panel suggest both iron deficiency anemia and anemia of chronic disease. -Transfuse IV iron - Trend Hgb -Transfuse for hemoglobin less than 7 -Monitor repeat CBC   Sepsis due to urinary tract infection ([place]) Meeting sepsis criteria with fever of 101.7, leukocytosis of 15.1, and tachycardia heart rates  100s to 170s.  Lactic acid 2 > 1.6.  No recent  urine cultures -IV cefepime started in ED, will continue with IV ceftriaxone 2 g daily -Follow-up blood and urine cultures -1 L bolus given, continue LR 75cc/hr x 20hrs   Acute kidney injury superimposed on chronic kidney disease ([place]) AKI on CKD stage IIIb.  Creatinine elevated 2.34, baseline 1.5-1.9.  Acute urinary retention with 1 L of urine in bladder. -Foley can be discontinued and monitor for void trial - IVF   Acute urinary retention 1 L of urine drained from bladder.   -Foley catheter inserted in ED-discontinue as above   Vascular dementia [facility]) Per daughter status is currently at baseline.  He is alert and oriented to person.  He resides at [facility] facility in the memory unit. -Resume Depakote, -Hold Zoloft for now with QT prolongation of [place] a few days ago, head cervical CT negative for acute abnormality, complains of pelvic pain, but CT pelvis negative for fracture.-Sling to left shoulder from fracture of humeral neck 08/2022.     Essential hypertension, benign Soft to stable. -Resume midodrine 10 mg twice daily -Started on Cardizem twice daily and monitor -Resume metoprolol 25 mg twice daily -Wean off Cardizem drip as able    DVT prophylaxis:SCDs Code Status: DNR Family Communication: Daughter at bedside 2/14 Disposition Plan:  Status is: Inpatient Remains inpatient appropriate because: Need for IV medications.   Consultants:  Palliative care  Procedures:  None  Antimicrobials:  Anti-infectives (From admission, onward)    Start     Dose/Rate Route Frequency Ordered Stop  09/28/22 1000  ceFEPIme (MAXIPIME) 2 g in sodium chloride 0.9 % 100 mL IVPB  Status:  Discontinued        2 g 200 mL/hr over 30 Minutes Intravenous Every 24 hours 09/27/22 1244 09/27/22 1332   09/28/22 1000  ceFEPIme (MAXIPIME) 1 g in sodium chloride 0.9 % 100 mL IVPB  Status:  Discontinued        1 g 200 mL/hr over 30 Minutes Intravenous Every 24 hours 09/27/22 1332  09/27/22 1750   09/28/22 1000  cefTRIAXone (ROCEPHIN) 2 g in sodium chloride 0.9 % 100 mL IVPB        2 g 200 mL/hr over 30 Minutes Intravenous Every 24 hours 09/27/22 1749     09/27/22 1230  ceFEPIme (MAXIPIME) 2 g in sodium chloride 0.9 % 100 mL IVPB        2 g 200 mL/hr over 30 Minutes Intravenous  Once 09/27/22 1221 09/27/22 1338      Subjective: Patient seen and evaluated today with no new acute complaints or concerns. No acute concerns or events noted overnight.  Objective: Vitals:   09/28/22 0300 09/28/22 0400 09/28/22 0427 09/28/22 0500  BP: 99/64 (!) 139/58  119/73  Pulse: 86 83  88  Resp: (!) 21 20  18  $ Temp:   97.8 F (36.6 C)   TempSrc:   Oral   SpO2: 95% (!) 86%  93%  Weight:      Height:        Intake/Output Summary (Last 24 hours) at 09/28/2022 0827 Last data filed at 09/28/2022 0519 Gross per 24 hour  Intake 2294.41 ml  Output 625 ml  Net 1669.41 ml   Filed Weights   09/27/22 1125 09/27/22 1700  Weight: 66.7 kg 70.7 kg    Examination:  General exam: Appears calm and comfortable  Respiratory system: Clear to auscultation. Respiratory effort normal.  3 L nasal cannula Cardiovascular system: S1 & S2 heard, irregular rate. Gastrointestinal system: Abdomen is soft Central nervous system: Alert and awake Extremities: No edema Skin: No significant lesions noted Psychiatry: Flat affect.    Data Reviewed: I have personally reviewed following labs and imaging studies  CBC: Recent Labs  Lab 09/27/22 1205 09/28/22 0446  WBC 15.1* 13.3*  NEUTROABS 13.9*  --   HGB 7.3* 7.2*  HCT 23.7* 23.8*  MCV 109.7* 109.2*  PLT 207 [id number]   Basic Metabolic Panel: Recent Labs  Lab 09/27/22 1205 09/28/22 0446  NA 140 139  K 4.0 4.5  CL 112* 109  CO2 17* 21*  GLUCOSE 93 105*  BUN 53* 60*  CREATININE 2.34* 2.47*  CALCIUM 7.2* 8.5*  MG 1.2* 1.9   GFR: Estimated Creatinine Clearance: 20.3 mL/min (A) (by C-G formula based on SCr of 2.47 mg/dL (H)). Liver  Function Tests: Recent Labs  Lab 09/27/22 1205  AST 26  ALT 16  ALKPHOS 58  BILITOT 0.7  PROT 5.7*  ALBUMIN 2.2*   No results for input(s): "LIPASE", "AMYLASE" in the last 168 hours. No results for input(s): "AMMONIA" in the last 168 hours. Coagulation Profile: No results for input(s): "INR", "PROTIME" in the last 168 hours. Cardiac Enzymes: No results for input(s): "CKTOTAL", "CKMB", "CKMBINDEX", "TROPONINI" in the last 168 hours. BNP (last 3 results) No results for input(s): "PROBNP" in the last 8760 hours. HbA1C: No results for input(s): "HGBA1C" in the last 72 hours. CBG: No results for input(s): "GLUCAP" in the last 168 hours. Lipid Profile: No results for input(s): "CHOL", "HDL", "LDLCALC", "  TRIG", "CHOLHDL", "LDLDIRECT" in the last 72 hours. Thyroid Function Tests: No results for input(s): "TSH", "T4TOTAL", "FREET4", "T3FREE", "THYROIDAB" in the last 72 hours. Anemia Panel: Recent Labs    09/27/22 1205  FERRITIN 746*  TIBC 193*  IRON 10*   Sepsis Labs: Recent Labs  Lab 09/27/22 1205 09/27/22 1324  LATICACIDVEN 2.0* 1.6    Recent Results (from the past 240 hour(s))  Blood Culture (routine x 2)     Status: None (Preliminary result)   Collection Time: 09/27/22 12:05 PM   Specimen: BLOOD  Result Value Ref Range Status   Specimen Description BLOOD RIGHT ASSIST CONTROL  Final   Special Requests   Final    BOTTLES DRAWN AEROBIC AND ANAEROBIC Blood Culture adequate volume   Culture   Final    NO GROWTH < 24 HOURS Performed at [facility], [address] [place], [place] [address]    Report Status PENDING  Incomplete  Blood Culture (routine x 2)     Status: None (Preliminary result)   Collection Time: 09/27/22 12:55 PM   Specimen: BLOOD  Result Value Ref Range Status   Specimen Description BLOOD BLOOD LEFT ARM  Final   Special Requests   Final    Blood Culture adequate volume BOTTLES DRAWN AEROBIC AND ANAEROBIC   Culture   Final    NO GROWTH < 24  HOURS Performed at [facility], [address] [place], [place] [address]    Report Status PENDING  Incomplete  Resp panel by RT-PCR (RSV, Flu A&B, Covid) Urine, Catheterized     Status: None   Collection Time: 09/27/22 12:59 PM   Specimen: Urine, Catheterized; Nasal Swab  Result Value Ref Range Status   SARS Coronavirus 2 by RT PCR NEGATIVE NEGATIVE Final    Comment: (NOTE) SARS-CoV-2 target nucleic acids are NOT DETECTED.  The SARS-CoV-2 RNA is generally detectable in upper respiratory specimens during the acute phase of infection. The lowest concentration of SARS-CoV-2 viral copies this assay can detect is 138 copies/mL. A negative result does not preclude SARS-Cov-2 infection and should not be used as the sole basis for treatment or other patient management decisions. A negative result may occur with  improper specimen collection/handling, submission of specimen other than nasopharyngeal swab, presence of viral mutation(s) within the areas targeted by this assay, and inadequate number of viral copies(<138 copies/mL). A negative result must be combined with clinical observations, patient history, and epidemiological information. The expected result is Negative.  Fact Sheet for Patients:  EntrepreneurPulse.com.au  Fact Sheet for Healthcare Providers:  IncredibleEmployment.be  This test is no t yet approved or cleared by the [place] FDA and  has been authorized for detection and/or diagnosis of SARS-CoV-2 by FDA under an Emergency Use Authorization (EUA). This EUA will remain  in effect (meaning this test can be used) for the duration of the COVID-19 declaration under Section 564(b)(1) of the Act, 21 U.S.C.section 360bbb-3(b)(1), unless the authorization is terminated  or revoked sooner.       Influenza A by PCR NEGATIVE NEGATIVE Final   Influenza B by PCR NEGATIVE NEGATIVE Final    Comment: (NOTE) The Xpert Xpress  SARS-CoV-2/FLU/RSV plus assay is intended as an aid in the diagnosis of influenza from Nasopharyngeal swab specimens and should not be used as a sole basis for treatment. Nasal washings and aspirates are unacceptable for Xpert Xpress SARS-CoV-2/FLU/RSV testing.  Fact Sheet for Patients: EntrepreneurPulse.com.au  Fact Sheet for Healthcare Providers: IncredibleEmployment.be  This test is not  yet approved or cleared by the [place] and has been authorized for detection and/or diagnosis of SARS-CoV-2 by FDA under an Emergency Use Authorization (EUA). This EUA will remain in effect (meaning this test can be used) for the duration of the COVID-19 declaration under Section 564(b)(1) of the Act, 21 U.S.C. section 360bbb-3(b)(1), unless the authorization is terminated or revoked.     Resp Syncytial Virus by PCR NEGATIVE NEGATIVE Final    Comment: (NOTE) Fact Sheet for Patients: EntrepreneurPulse.com.au  Fact Sheet for Healthcare Providers: IncredibleEmployment.be  This test is not yet approved or cleared by the [place] FDA and has been authorized for detection and/or diagnosis of SARS-CoV-2 by FDA under an Emergency Use Authorization (EUA). This EUA will remain in effect (meaning this test can be used) for the duration of the COVID-19 declaration under Section 564(b)(1) of the Act, 21 U.S.C. section 360bbb-3(b)(1), unless the authorization is terminated or revoked.  Performed at [facility], [address], [place], [place] [address]   MRSA Next Gen by PCR, Nasal     Status: None   Collection Time: 09/27/22  5:05 PM   Specimen: Nasal Mucosa; Nasal Swab  Result Value Ref Range Status   MRSA by PCR Next Gen NOT DETECTED NOT DETECTED Final    Comment: (NOTE) The GeneXpert MRSA Assay (FDA approved for NASAL specimens only), is one component of a comprehensive MRSA colonization surveillance program.  It is not intended to diagnose MRSA infection nor to guide or monitor treatment for MRSA infections. Test performance is not FDA approved in patients less than 16 years old. Performed at [facility], [address], [place], [place] [address]          Radiology Studies: CT Head Wo Contrast  Result Date: 09/27/2022 CLINICAL DATA:  Provided history: Head trauma, minor. EXAM: CT HEAD WITHOUT CONTRAST TECHNIQUE: Contiguous axial images were obtained from the base of the skull through the vertex without intravenous contrast. RADIATION DOSE REDUCTION: This exam was performed according to the departmental dose-optimization program which includes automated exposure control, adjustment of the mA and/or kV according to patient size and/or use of iterative reconstruction technique. COMPARISON:  Prior head CT examinations 06/04/2022 and earlier. FINDINGS: Brain: Moderate cerebral atrophy. Small foci of chronic encephalomalacia/gliosis within the inferior frontal lobes bilaterally, at sites of prior parenchymal contusions. Moderate patchy and ill-defined hypoattenuation within the cerebral white matter, nonspecific but compatible with chronic small ischemic disease. There is no acute intracranial hemorrhage. No demarcated cortical infarct. No extra-axial fluid collection. No evidence of an intracranial mass. No midline shift. Vascular: No hyperdense vessel.  Atherosclerotic calcifications. Skull: Known non-acute, nondisplaced fracture of the parietooccipital calvarium. Sinuses/Orbits: No mass or acute finding within the imaged orbits. Small volume frothy secretions within a posterior left ethmoid air cell. IMPRESSION: 1.  No evidence of an acute intracranial abnormality. 2. Small foci of chronic encephalomalacia/gliosis within the inferior temporal lobes, bilaterally (at sites of prior parenchymal contusions). 3. Moderate chronic small vessel ischemic changes within the cerebral white matter. 4. Moderate cerebral  atrophy. 5. Known non-acute, nondisplaced fracture of the parietooccipital calvarium. 6. Mild left ethmoid sinusitis. Electronically Signed   By: Kellie Simmering D.O.   On: 09/27/2022 14:20   CT PELVIS WO CONTRAST  Result Date: 09/27/2022 CLINICAL DATA:  Status post fall, hip pain EXAM: CT PELVIS WITHOUT CONTRAST TECHNIQUE: Multidetector CT imaging of the pelvis was performed following the standard protocol without intravenous contrast. RADIATION DOSE REDUCTION: This exam was performed according to  the departmental dose-optimization program which includes automated exposure control, adjustment of the mA and/or kV according to patient size and/or use of iterative reconstruction technique. COMPARISON:  Pelvic radiograph dated 03/01/2022 FINDINGS: Urinary Tract: Bladder is decompressed by an indwelling Foley catheter. Bowel:  Sigmoid diverticulosis, without evidence of diverticulitis. Vascular/Lymphatic: No evidence of aneurysm. Atherosclerotic calcifications the abdominal aorta and branch vessels. No suspicious pelvic lymphadenopathy. Reproductive: Prostate is unremarkable. Penile prosthesis with reservoir in the right anterior lower pelvis (series 7/image 80). Other:  No pelvic ascites. Musculoskeletal: Status post ORIF of the right hip, without evidence of complication. Visualized bony pelvis appears intact. Moderate degenerative changes of the left hip. Degenerative changes of the lower lumbar spine, most prominent at L4-5. IMPRESSION: Status post ORIF of the right hip, without evidence of complication. No fracture or dislocation is seen. Additional ancillary findings as above. Aortic Atherosclerosis (ICD10-I70.0). Electronically Signed   By: Julian Hy M.D.   On: 09/27/2022 14:20   CT Cervical Spine Wo Contrast  Result Date: 09/27/2022 CLINICAL DATA:  Neck pain after falling. Recent urinary tract infection diagnosis. EXAM: CT CERVICAL SPINE WITHOUT CONTRAST TECHNIQUE: Multidetector CT imaging of the  cervical spine was performed without intravenous contrast. Multiplanar CT image reconstructions were also generated. RADIATION DOSE REDUCTION: This exam was performed according to the departmental dose-optimization program which includes automated exposure control, adjustment of the mA and/or kV according to patient size and/or use of iterative reconstruction technique. COMPARISON:  Cervical spine CT 06/02/2022. FINDINGS: Despite efforts by the technologist and patient, mild motion artifact is present on today's exam and could not be eliminated. This reduces exam sensitivity and specificity. Alignment: Straightening without focal angulation or significant listhesis. Skull base and vertebrae: No evidence of acute fracture or traumatic subluxation. There is multilevel cervical spondylosis with disc space narrowing, uncinate spurring and facet hypertrophy. Ossification of the ligamentum nuchae noted. Soft tissues and spinal canal: No prevertebral fluid or swelling. No visible canal hematoma. Disc levels: Multilevel spondylosis with disc space narrowing, uncinate spurring and facet hypertrophy, similar to previous study. Resulting chronic mild to moderate foraminal narrowing at multiple levels. No large disc herniation identified. Upper chest: Emphysematous changes at the lung apices. Other: Bilateral carotid atherosclerosis. IMPRESSION: 1. No evidence of acute cervical spine fracture, traumatic subluxation or static signs of instability. 2. Multilevel cervical spondylosis, similar to previous study. Electronically Signed   By: Richardean Sale M.D.   On: 09/27/2022 14:15   DG Chest Port 1 View  Result Date: 09/27/2022 CLINICAL DATA:  Possible sepsis. EXAM: PORTABLE CHEST 1 VIEW COMPARISON:  Chest radiograph 03/01/2022. FINDINGS: The left chest wall cardiac device is stable. The cardiomediastinal silhouette is stable. There is no focal consolidation or pulmonary edema. There is no pleural effusion or pneumothorax  There is possible prior fracture of the left clavicle. There is a left proximal humeral fracture which was acute on the radiographs from 08/26/2022. There is no new acute osseous abnormality. IMPRESSION: No radiographic evidence of acute cardiopulmonary process. Electronically Signed   By: Valetta Mole M.D.   On: 09/27/2022 13:26        Scheduled Meds:  Chlorhexidine Gluconate Cloth  6 each Topical Daily   divalproex  125 mg Oral QHS   finasteride  5 mg Oral Daily   metoprolol tartrate  25 mg Oral BID   midodrine  10 mg Oral BID WC   pantoprazole  40 mg Oral Daily   Continuous Infusions:  cefTRIAXone (ROCEPHIN)  IV     diltiazem (  CARDIZEM) infusion Stopped (09/28/22 0424)   lactated ringers 75 mL/hr at 09/28/22 0519     LOS: 1 day    Time spent: 35 minutes    Semone Orlov D Manuella Ghazi, DO [facility]  If 7PM-7AM, please contact night-coverage [email or web address] 09/28/2022, 8:27 AM

## 2022-09-28 NOTE — TOC Initial Note (Signed)
Transition of Care [facility]) - Initial/Assessment Note    Patient Details  Name: Benjamin Mason MRN: [id number] Date of Birth: 1930/09/21  Transition of [facility]) CM/SW Contact:    Shade Flood, LCSW Phone Number: 09/28/2022, 10:31 AM  Clinical Narrative:                  Pt admitted from [facility]. Pt known to TOC from previous admission. Per MD, pt with UTI sepsis and frequent falls. Palliative consult ordered.   Pt has used [facility] in the past. He has also been at [facility] for [employer].   TOC will follow and continue to assess for dc planning needs.  Expected Discharge Plan: Assisted Living Barriers to Discharge: Continued Medical Work up   Patient Goals and CMS Choice            Expected Discharge Plan and Services In-house Referral: Clinical Social Work     Living arrangements for the past 2 months: [place]                                      Prior Living Arrangements/Services Living arrangements for the past 2 months: [place] Lives with:: Facility Resident Patient language and need for interpreter reviewed:: Yes Do you feel safe going back to the place where you live?: Yes      Need for Family Participation in Patient Care: No (Comment) Care giver support system in place?: Yes (comment)   Criminal Activity/Legal Involvement Pertinent to Current Situation/Hospitalization: No - Comment as needed  Activities of Daily Living Home Assistive Devices/Equipment: Cane (specify quad or straight), Eyeglasses ADL Screening (condition at time of admission) Patient's cognitive ability adequate to safely complete daily activities?: Yes Is the patient deaf or have difficulty hearing?: No Does the patient have difficulty seeing, even when wearing glasses/contacts?: No Does the patient have difficulty concentrating, remembering, or making decisions?: Yes Patient able to express need for assistance with ADLs?:  Yes Does the patient have difficulty dressing or bathing?: No Independently performs ADLs?: Yes (appropriate for developmental age) (currently  needs assistance due to left arm sling) Does the patient have difficulty walking or climbing stairs?: No Weakness of Legs: Both Weakness of Arms/Hands: Both  Permission Sought/Granted                  Emotional Assessment       Orientation: : Oriented to Self Alcohol / Substance Use: Not Applicable Psych Involvement: No (comment)  Admission diagnosis:  Atrial fibrillation with RVR ([place]) [I48.91] Patient Active Problem List   Diagnosis Date Noted   Atrial fibrillation with RVR ([place]) 09/27/2022   Acute kidney injury superimposed on chronic kidney disease ([place]) 09/27/2022   Sepsis due to urinary tract infection ([place]) 09/27/2022   Acute on chronic anemia 09/27/2022   Acute urinary retention 09/27/2022   Fall 06/03/2022   Altered mental status 06/03/2022   Hypomagnesemia 06/03/2022   Macrocytic anemia 06/03/2022   Chronic atrial fibrillation with RVR ([place]) 06/03/2022   Vascular dementia ([place]) 06/03/2022   Depression 06/03/2022   BPH (benign prostatic hyperplasia) 06/03/2022   Subdural hematoma ([place]) 06/03/2022   Subdural hemorrhage ([place]) 06/02/2022   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[place]) 04/20/2022   Pacemaker 10/13/2020   Aortic regurgitation 06/10/2020   Mitral regurgitation 06/10/2020   Closed displaced intertrochanteric fracture of right femur ([place]) 06/10/2020  H/O artificial lens replacement 06/10/2020   History of sick sinus syndrome 06/10/2020   HTN (hypertension) 06/10/2020   Sick sinus syndrome ([place]) 12/29/2019   Sinus pause 12/28/2019   Permanent atrial fibrillation ([place]) 04/03/2018   Progressive angina ([place]) 11/18/2016   CKD (chronic kidney disease), stage III ([place]) 11/18/2016   Stage 3a chronic kidney disease ([place]) 11/18/2016   GERD (gastroesophageal reflux disease) 12/04/2013    Dysphagia, unspecified(787.20) 12/04/2013   Valvular heart disease 07/31/2013   Endocarditis, valve unspecified 07/31/2013   Long term (current) use of anticoagulants 11/23/2010   Essential hypertension, benign 12/17/2009   Coronary atherosclerosis of native coronary artery 05/26/2009   Mixed hyperlipidemia 05/25/2009   PCP:  Pcp, No Pharmacy:   [place], [place] [address] [place] [address] Phone: [phone] Fax: [phone]     Social Determinants of Health ([place]) Social History: [place]: No Food Insecurity (06/03/2022)  Housing: Low Risk  (06/03/2022)  Transportation Needs: No Transportation Needs (06/03/2022)  Utilities: Not At Risk (06/03/2022)  Tobacco Use: Medium Risk (09/27/2022)   SDOH Interventions:     Readmission Risk Interventions    09/28/2022   10:30 AM  Readmission Risk Prevention Plan  Transportation Screening Complete  Home Care Screening Complete  Medication Review (RN CM) Complete

## 2022-09-28 NOTE — Consult Note (Signed)
Consultation Note Date: 09/28/2022   Patient Name: Benjamin Mason  DOB: [birth year]  MRN: [id number]  Age / Sex: 89 y.o., male  PCP: Pcp, No Referring Physician: Rodena Goldmann, DO  Reason for Consultation: Establishing goals of care  HPI/Patient Profile: 89 y.o. male  with past medical history of vascular dementia, A-fib, sick sinus syndrome with pacer, HTN, CAD, CKD 3, history of subdural hematoma, resident of [facility] memory care admitted on 09/27/2022 with sepsis with UTI and A-fib with RVR.   Clinical Assessment and Goals of Care: I have reviewed medical records including EPIC notes, labs and imaging, received report from RN, assessed the patient.  Benjamin Mason is sitting up in the bed in his room.  He greets me, making and mostly keeping eye contact.  He is alert, oriented to self and situation.  He is holding a fruit cup and attempting to feed himself.  He is calm and cooperative, not fearful.  I believe that he can make his basic needs known.  There is no family at bedside at this time.  Call to daughter, Benjamin Mason, to discuss diagnosis prognosis, [place], EOL wishes, disposition and options.  I introduced Palliative Medicine as specialized medical care for people living with serious illness. It focuses on providing relief from the symptoms and stress of a serious illness. The goal is to improve quality of life for both the patient and the family.  We discussed a brief life review of the patient.  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  He moved into memory care unit July 2023.  Benjamin Mason states that he has had multiple falls (5) since his admit.  Some quite severe causing subdural hematoma.  We then focused on their current illness.  We talk about UTI and the treatment plan.  We talk about some issues that go with sepsis such as A-fib with RVR.  Benjamin Mason states that due to her father's falls, he is no longer on  anticoagulation.  We talk about some "what if's and maybe's".  Benjamin Mason asks for a meeting with her husband present.  We plan for a family meeting at bedside 2/15 at 10 AM.  The natural disease trajectory and expectations at EOL were discussed.  Advanced directives, concepts specific to code status, artifical feeding and hydration, and rehospitalization were considered and discussed.  Discussed the importance of continued conversation with family and the medical providers regarding overall plan of care and treatment options, ensuring decisions are within the context of the patient's values and GOCs.  Questions and concerns were addressed.  The family was encouraged to call with questions or concerns.  PMT will continue to support holistically.  Conference with attending, bedside nursing staff, transition of care team related to patient condition, needs, goals of care, disposition    HCPOA  HCPOA -daughter, Benjamin Mason.    SUMMARY OF RECOMMENDATIONS   At this point continue to treat the treatable but no CPR or intubation Time for outcomes. PMT Family meeting 2/15 at 10 AM. Considering disposition  choices   Code Status/Advance Care Planning: DNR  Symptom Management:  Per hospitalist, no additional needs at this time.  Palliative Prophylaxis:  Frequent Pain Assessment, Oral Care, and Turn Reposition  Additional Recommendations (Limitations, Scope, Preferences): Treat the treatable but no extraordinary measures  Psycho-social/Spiritual:  Desire for further Chaplaincy support:no Additional Recommendations: Caregiving  Support/Resources and Education on Hospice  Prognosis:  Unable to determine, based on outcomes.  6 months or less would not be surprising based on advanced age, chronic illness burden, memory care  Discharge Planning: Anticipate need for short-term rehab, return to memory care       Primary Diagnoses: Present on Admission:  Atrial fibrillation with RVR ([place])   Vascular dementia ([place])  Essential hypertension, benign  Depression  HTN (hypertension)  Acute kidney injury superimposed on chronic kidney disease ([place])  Sepsis due to urinary tract infection ([place])  Acute on chronic anemia  Hypomagnesemia  Fall  Acute urinary retention   I have reviewed the medical record, interviewed the patient and family, and examined the patient. The following aspects are pertinent.  Past Medical History:  Diagnosis Date   Anxiety    Aortic regurgitation    Mild to moderate   CKD (chronic kidney disease) stage 3, GFR 30-59 ml/min (HCC)    Coronary atherosclerosis of native coronary artery    BMS to RCA and LAD 1995   Essential hypertension    Frequent PVCs    IBS (irritable bowel syndrome)    Lactose intolerance    Mitral regurgitation    Moderate   Mixed hyperlipidemia    Permanent atrial fibrillation (HCC)    Sick sinus syndrome ([place])    [employer] single-chamber pacemaker May 2021 - Dr. Caryl Comes   Skin cancer    Skull fracture [facility])    Social History   Socioeconomic History   Marital status: Married    Spouse name: Not on file   Number of children: 7   Years of education: Not on file   Highest education level: Not on file  Occupational History   Occupation: RETIRED    Employer: OTHER    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 [employer]  Tobacco Use   Smoking status: Former    Packs/day: 2.00    Years: 25.00    Total pack years: 50.00    Types: Cigarettes    Quit date: 08/15/1962    Years since quitting: 60.1   Smokeless tobacco: Never  Vaping Use   Vaping Use: Never used  Substance and Sexual Activity   Alcohol use: No    Alcohol/week: 0.0 standard drinks of alcohol   Drug use: No   Sexual activity: Not on file  Other Topics Concern   Not on file  Social History Narrative   Right handed   Caffeine 1 cup daily   Live in memory care at [facility] you right handed or left handed?    Are you currently employed ?no   retired           Scientist, physiological Strain: Not on file  Food Insecurity: No Food Insecurity (06/03/2022)   Hunger Vital Sign    Worried About Running Out of Food in the Last Year: Never true    Ran Out of Food in the Last Year: Never true  Transportation Needs: No Transportation Needs (06/03/2022)   PRAPARE - Hydrologist (Medical): No    Lack of Transportation (Non-Medical): No  Physical Activity: Not on file  Stress: Not on file  Social Connections: Not on file   Family History  Problem Relation Age of Onset   Heart attack Father        in his early 77's   Scheduled Meds:  Chlorhexidine Gluconate Cloth  6 each Topical Daily   diltiazem  30 mg Oral Q12H   divalproex  125 mg Oral QHS   finasteride  5 mg Oral Daily   metoprolol tartrate  25 mg Oral BID   midodrine  10 mg Oral BID WC   pantoprazole  40 mg Oral Daily   Continuous Infusions:  cefTRIAXone (ROCEPHIN)  IV 2 g (09/28/22 0946)   diltiazem (CARDIZEM) infusion Stopped (09/28/22 0424)   ferric gluconate (FERRLECIT) IVPB 250 mg (09/28/22 1122)   PRN Meds:.acetaminophen **OR** acetaminophen, hydrOXYzine, melatonin, mouth rinse, polyethylene glycol Medications Prior to Admission:  Prior to Admission medications   Medication Sig Start Date End Date Taking? Authorizing Provider  acetaminophen (TYLENOL) 650 MG CR tablet Take 650 mg by mouth 2 (two) times daily. 03/03/22  Yes [provider]  Cholecalciferol (VITAMIN D3) 25 MCG (1000 UT) CAPS Take 1,000 Units by mouth daily. 04/28/22  Yes [provider]  Cyanocobalamin (B-12) 1000 MCG TABS Take 1 tablet (1,000 mcg total) by mouth daily. 04/20/22  Yes Rondel Jumbo, PA-C  divalproex (DEPAKOTE) 125 MG DR tablet Take 1 tablet (125 mg total) by mouth at bedtime. 07/26/22  Yes Rondel Jumbo, PA-C  finasteride (PROSCAR) 5 MG tablet Take 5 mg by mouth daily.     Yes [provider]  hydrOXYzine (ATARAX) 10  MG tablet Take 10 mg by mouth 2 (two) times daily as needed for anxiety. 08/17/22  Yes [provider]  loratadine (CLARITIN) 10 MG tablet Take 10 mg by mouth in the morning.   Yes [provider]  metoprolol tartrate (LOPRESSOR) 25 MG tablet Take 1 tablet (25 mg total) by mouth 2 (two) times daily. 07/06/22  Yes Satira Sark, MD  midodrine (PROAMATINE) 5 MG tablet TAKE TWO (2) TABLETS BY MOUTH IN THE MORNING AND ONE TABLET AT BEDTIME 07/11/22  Yes Satira Sark, MD  nitroGLYCERIN (NITROSTAT) 0.4 MG SL tablet DISSOLVE ONE TABLET UNDER TONGUE EVERY 5 MINUTES UP TO 3 DOSES AS NEEDED FOR CHEST PAIN 09/12/22  Yes Satira Sark, MD  Nutritional Supplements (ENSURE HIGH PROTEIN) LIQD Take 237 mLs by mouth 3 (three) times daily after meals. 03/25/22  Yes [provider]  pantoprazole (PROTONIX) 40 MG tablet Take 40 mg by mouth daily.  01/17/19  Yes [provider]  Propylene Glycol (SYSTANE BALANCE) 0.6 % SOLN Place 1 drop into both eyes in the morning and at bedtime.   Yes [provider]  sertraline (ZOLOFT) 50 MG tablet Take 50 mg by mouth every morning. 05/09/22  Yes [provider]  brimonidine (ALPHAGAN) 0.2 % ophthalmic solution Place 1 drop into both eyes 2 (two) times daily. Patient not taking: Reported on 09/27/2022 01/25/22   [provider]  LAGEVRIO 200 MG CAPS capsule SMARTSIG:4 Capsule(s) By Mouth Every 12 Hours Patient not taking: Reported on 09/27/2022 08/26/22   [provider]   No Known Allergies Review of Systems  Unable to perform ROS: Dementia    Physical Exam Vitals and nursing note reviewed.  Constitutional:      General: He is not in acute distress. Cardiovascular:     Rate and Rhythm: Tachycardia present.  Pulmonary:  Effort: Pulmonary effort is normal. No respiratory distress.  Neurological:     Mental Status: He is alert.  Psychiatric:     Comments: Calm and cooperative, not fearful      Vital Signs: BP 119/73   Pulse 88   Temp 97.7 F (36.5 C) (Oral)   Resp 18   Ht 6' (1.829 m)   Wt 70.7 kg   SpO2 93%   BMI 21.14 kg/m  Pain Scale: PAINAD   Pain Score: 0-No pain   SpO2: SpO2: 93 % O2 Device:SpO2: 93 % O2 Flow Rate: .O2 Flow Rate (L/min): 2 L/min  IO: Intake/output summary:  Intake/Output Summary (Last 24 hours) at 09/28/2022 1412 Last data filed at 09/28/2022 1100 Gross per 24 hour  Intake 2294.41 ml  Output 950 ml  Net 1344.41 ml    LBM: Last BM Date : 09/26/22 Baseline Weight: Weight: 66.7 kg Most recent weight: Weight: 70.7 kg     Palliative Assessment/Data:     Time In: 1300 Time Out: 1415 Time Total: 75 minutes  Greater than 50%  of this time was spent counseling and coordinating care related to the above assessment and plan.  Signed by: Drue Novel, NP   Please contact Palliative Medicine Team phone at [phone] for questions and concerns.  For individual provider: See Shea Evans

## 2022-09-28 NOTE — Progress Notes (Signed)
Pt arrived to room 330 via bed from ICU. Pt transferred over to bed by staff x2 without difficulty. Pt awake, oriented to self only. Denies c/o. Sling intact to left arm. Tele monitor applied, central tele reports a-fib. Bed alarm on for safety, call bell in place and pt advised to use but unable to retain information.

## 2022-09-29 DIAGNOSIS — Z7189 Other specified counseling: Secondary | ICD-10-CM | POA: Diagnosis not present

## 2022-09-29 DIAGNOSIS — N390 Urinary tract infection, site not specified: Secondary | ICD-10-CM | POA: Diagnosis not present

## 2022-09-29 DIAGNOSIS — I4891 Unspecified atrial fibrillation: Secondary | ICD-10-CM | POA: Diagnosis not present

## 2022-09-29 DIAGNOSIS — A419 Sepsis, unspecified organism: Secondary | ICD-10-CM | POA: Diagnosis not present

## 2022-09-29 LAB — MAGNESIUM: Magnesium: 1.8 mg/dL (ref 1.7–2.4)

## 2022-09-29 LAB — BASIC METABOLIC PANEL
Anion gap: 8 (ref 5–15)
BUN: 52 mg/dL — ABNORMAL HIGH (ref 8–23)
CO2: 21 mmol/L — ABNORMAL LOW (ref 22–32)
Calcium: 8.5 mg/dL — ABNORMAL LOW (ref 8.9–10.3)
Chloride: 109 mmol/L (ref 98–111)
Creatinine, Ser: 2.14 mg/dL — ABNORMAL HIGH (ref 0.61–1.24)
GFR, Estimated: 29 mL/min — ABNORMAL LOW (ref >60)
Glucose, Bld: 98 mg/dL (ref 70–99)
Potassium: 4.1 mmol/L (ref 3.5–5.1)
Sodium: 138 mmol/L (ref 135–145)

## 2022-09-29 LAB — URINE CULTURE: Culture: >=100000 — AB

## 2022-09-29 LAB — CBC
HCT: 24.5 % — ABNORMAL LOW (ref 39.0–52.0)
Hemoglobin: 7.3 g/dL — ABNORMAL LOW (ref 13.0–17.0)
MCH: 32.6 pg (ref 26.0–34.0)
MCHC: 29.8 g/dL — ABNORMAL LOW (ref 30.0–36.0)
MCV: 109.4 fL — ABNORMAL HIGH (ref 80.0–100.0)
Platelets: 187 10*3/uL (ref 150–400)
RBC: 2.24 MIL/uL — ABNORMAL LOW (ref 4.22–5.81)
RDW: 14.2 % (ref 11.5–15.5)
WBC: 10.3 10*3/uL (ref 4.0–10.5)
nRBC: 0 % (ref 0.0–0.2)

## 2022-09-29 NOTE — Progress Notes (Addendum)
Palliative: [name] is sitting up in the bed in his room.  He appears acutely/chronically ill and quite frail.  He has known memory loss but is able to tell me his name.  He is pleasant.  I am not sure that he can make his basic needs known.  His daughter/HCPOA, Benjamin Mason and her husband Benjamin Mason are present at bedside.  We go to my office for meeting.  We talk in detail about Benjamin Mason acute declines over the last 6 months in particular.  He started having signs of memory loss over 5 years ago.  He has been living at [facility] since approximately July 2023.  Benjamin Mason shares that he has fallen 5 times since admit to [facility].  Some of these falls have caused injury.  Family shares that he is experienced worsening memory loss after each injury.  I shared diagram of the chronic illness pathway, what is normal and expected.  We talk in detail about progression of dementia including physical, psychological, nutritional declines.    We talk about how to make choices for loved ones including 1) keeping them at the center of decision-making, 2) are we doing something for them or to them, can we change what is happening  3) the person Benjamin Mason was 10 years ago, how would that man tell Benjamin Mason to care for him now.  We talk about Benjamin Mason dignity.  His son allow Benjamin Mason shares stories that relate the importance of dignity for Benjamin Mason.  We talk about CODE STATUS.  Benjamin Mason and Benjamin Mason share that Benjamin Mason did not hold these discussions with them, and Benjamin Mason has elected DNR.  We talk about what we can and cannot change and the realities of CPR.  We talk about preferred place of death, Benjamin Mason has not designated a preferred place of death.   We talk about the benefits of outpatient palliative services and hospice care.  We also talk about the concept of "let nature take its course".  I shared that it is not illegal or unethical, but some people have a moral problem with not doing everything.  We talk  about when Benjamin Mason gets sick again, how will Benjamin Mason care for him. Conference with attending, bedside nursing staff, transition of care team related to patient condition, needs, goals of care, disposition.  Plan: Continue to treat the treatable but no CPR or intubation.  Anticipate return to [facility] memory care, physical therapy if qualified.  Outpatient palliative services with [facility], transitioning to hospice care when appropriate. DNR/[place]en[place] form completed and placed on chart.  70 minutes, extended time Benjamin Axe, NP Palliative medicine team Team phone [phone] Greater than 50% of this time was spent counseling and coordinating care related to the above impression and plan.

## 2022-09-29 NOTE — Progress Notes (Signed)
Foley removed per order without complication. [place] male external catheter placed for urine output.

## 2022-09-29 NOTE — Progress Notes (Signed)
While providing hs meds, pt was sat in upright position, mouth cleansed and fluids given prior to meds.  Meds given one at a time with sips in between.  Noted several episodes of pt coughing during meds.  Private duty CG in room, states pt ate dinner without any notable coughing.  SLP consult placed for follow up as pt is high risk.

## 2022-09-29 NOTE — Progress Notes (Signed)
PROGRESS NOTE    Benjamin Mason  [id number] DOB: 11-05-1930 DOA: 09/27/2022 PCP: Merryl Hacker, No   Brief Narrative:   Benjamin Mason is a 89 y.o. male with medical history significant for vascular dementia, atrial fibrillation, SSS with pacemaker, hypertension, coronary artery disease, CKD 3A, Subural hematoma. Patient presented to the ED from [place] memory unit reports of fever and altered mental status.  He was admitted with atrial fibrillation with RVR in the setting of sepsis with UTI.He apparently has E. coli UTI that is sensitive to Rocephin and is currently day 2/3.  PT evaluation pending as they would like to try SNF/rehab.  Assessment & Plan:   Principal Problem:   Atrial fibrillation with RVR (HCC) Active Problems:   Sepsis due to urinary tract infection (HCC)   Acute on chronic anemia   Acute kidney injury superimposed on chronic kidney disease (HCC)   Essential hypertension, benign   HTN (hypertension)   Fall   Hypomagnesemia   Vascular dementia ([place])   Depression   Acute urinary retention  Assessment and Plan:   Atrial fibrillation with RVR-resolved Heart rates up to 170s.  Currently controlled in the low 100s.  Likely driven by sepsis, in the setting of hypomagnesemia history of atrial fibrillation not on anticoagulation, Eliquis was discontinued after subdural hematoma 05/2022, Eliquis had to be reversed.  No improvement in heart rates with IV metoprolol 5 mg.  Last echo 2020- EF 55 to 60%. -Continue home metoprolol and start oral Cardizem 30 mg twice daily for now -Continue telemetry monitoring.   Acute on chronic anemia Hemoglobin 7.3, baseline 8-10.  Stool occult negative.  Denies GI blood loss.  Anemia panel suggest both iron deficiency anemia and anemia of chronic disease. -Transfused IV iron - Trend Hgb-stable -Transfuse for hemoglobin less than 7 -Monitor repeat CBC   Sepsis due to urinary tract infection ([place]) Meeting sepsis criteria  with fever of 101.7, leukocytosis of 15.1, and tachycardia heart rates  100s to 170s.  Lactic acid 2 > 1.6.  No recent urine cultures -IV cefepime started in ED, will continue with IV ceftriaxone 2 g daily -Follow-up blood and urine cultures -1 L bolus given, continue LR 75cc/hr x 20hrs   Acute kidney injury superimposed on chronic kidney disease ([place]) AKI on CKD stage IIIb.  Creatinine elevated 2.34, baseline 1.5-1.9.  Acute urinary retention with 1 L of urine in bladder. -Foley can be discontinued and monitor for void trial - IVF   Acute urinary retention 1 L of urine drained from bladder.   -Foley catheter inserted in ED-discontinue as above   Vascular dementia [facility]) Per daughter status is currently at baseline.  He is alert and oriented to person.  He resides at [facility] facility in the memory unit. -Resume Depakote, -Hold Zoloft for now with QT prolongation of [place] a few days ago, head cervical CT negative for acute abnormality, complains of pelvic pain, but CT pelvis negative for fracture.-Sling to left shoulder from fracture of humeral neck 08/2022. -PT evaluation pending     Essential hypertension, benign Soft to stable. -Resume midodrine 10 mg twice daily -Started on Cardizem twice daily and monitor -Resume metoprolol 25 mg twice daily    DVT prophylaxis:SCDs Code Status: DNR Family Communication: Daughter at bedside 2/14 Disposition Plan:  Status is: Inpatient Remains inpatient appropriate because: Need for IV medications.   Consultants:  Palliative care  Procedures:  None  Antimicrobials:  Anti-infectives (From admission, onward)  Start     Dose/Rate Route Frequency Ordered Stop   09/28/22 1000  ceFEPIme (MAXIPIME) 2 g in sodium chloride 0.9 % 100 mL IVPB  Status:  Discontinued        2 g 200 mL/hr over 30 Minutes Intravenous Every 24 hours 09/27/22 1244 09/27/22 1332   09/28/22 1000  ceFEPIme (MAXIPIME) 1 g in sodium chloride 0.9 % 100  mL IVPB  Status:  Discontinued        1 g 200 mL/hr over 30 Minutes Intravenous Every 24 hours 09/27/22 1332 09/27/22 1750   09/28/22 1000  cefTRIAXone (ROCEPHIN) 2 g in sodium chloride 0.9 % 100 mL IVPB        2 g 200 mL/hr over 30 Minutes Intravenous Every 24 hours 09/27/22 1749     09/27/22 1230  ceFEPIme (MAXIPIME) 2 g in sodium chloride 0.9 % 100 mL IVPB        2 g 200 mL/hr over 30 Minutes Intravenous  Once 09/27/22 1221 09/27/22 1338      Subjective: Patient seen and evaluated today with no new acute complaints or concerns. No acute concerns or events noted overnight.  Objective: Vitals:   09/28/22 0800 09/28/22 1800 09/28/22 2045 09/29/22 0346  BP:  (!) 117/47 131/69 124/83  Pulse:  91 95 94  Resp:  18 20 18  $ Temp: 97.7 F (36.5 C) 98.2 F (36.8 C) 97.7 F (36.5 C) 98.3 F (36.8 C)  TempSrc: Oral Oral Oral Oral  SpO2:  100% 95% 92%  Weight:      Height:        Intake/Output Summary (Last 24 hours) at 09/29/2022 1302 Last data filed at 09/29/2022 1100 Gross per 24 hour  Intake 480.56 ml  Output 2000 ml  Net -1519.44 ml   Filed Weights   09/27/22 1125 09/27/22 1700  Weight: 66.7 kg 70.7 kg    Examination:  General exam: Appears calm and comfortable  Respiratory system: Clear to auscultation. Respiratory effort normal.  3 L nasal cannula Cardiovascular system: S1 & S2 heard, irregular rate. Gastrointestinal system: Abdomen is soft Central nervous system: Alert and awake Extremities: No edema Skin: No significant lesions noted Psychiatry: Flat affect.    Data Reviewed: I have personally reviewed following labs and imaging studies  CBC: Recent Labs  Lab 09/27/22 1205 09/28/22 0446 09/29/22 0434  WBC 15.1* 13.3* 10.3  NEUTROABS 13.9*  --   --   HGB 7.3* 7.2* 7.3*  HCT 23.7* 23.8* 24.5*  MCV 109.7* 109.2* 109.4*  PLT 207 182 [id number]   Basic Metabolic Panel: Recent Labs  Lab 09/27/22 1205 09/28/22 0446 09/29/22 0434  NA 140 139 138  K 4.0 4.5  4.1  CL 112* 109 109  CO2 17* 21* 21*  GLUCOSE 93 105* 98  BUN 53* 60* 52*  CREATININE 2.34* 2.47* 2.14*  CALCIUM 7.2* 8.5* 8.5*  MG 1.2* 1.9 1.8   GFR: Estimated Creatinine Clearance: 23.4 mL/min (A) (by C-G formula based on SCr of 2.14 mg/dL (H)). Liver Function Tests: Recent Labs  Lab 09/27/22 1205  AST 26  ALT 16  ALKPHOS 58  BILITOT 0.7  PROT 5.7*  ALBUMIN 2.2*   No results for input(s): "LIPASE", "AMYLASE" in the last 168 hours. No results for input(s): "AMMONIA" in the last 168 hours. Coagulation Profile: No results for input(s): "INR", "PROTIME" in the last 168 hours. Cardiac Enzymes: No results for input(s): "CKTOTAL", "CKMB", "CKMBINDEX", "TROPONINI" in the last 168 hours. BNP (last 3 results) No  results for input(s): "PROBNP" in the last 8760 hours. HbA1C: No results for input(s): "HGBA1C" in the last 72 hours. CBG: No results for input(s): "GLUCAP" in the last 168 hours. Lipid Profile: No results for input(s): "CHOL", "HDL", "LDLCALC", "TRIG", "CHOLHDL", "LDLDIRECT" in the last 72 hours. Thyroid Function Tests: No results for input(s): "TSH", "T4TOTAL", "FREET4", "T3FREE", "THYROIDAB" in the last 72 hours. Anemia Panel: Recent Labs    09/27/22 1205  FERRITIN 746*  TIBC 193*  IRON 10*   Sepsis Labs: Recent Labs  Lab 09/27/22 1205 09/27/22 1324  LATICACIDVEN 2.0* 1.6    Recent Results (from the past 240 hour(s))  Blood Culture (routine x 2)     Status: None (Preliminary result)   Collection Time: 09/27/22 12:05 PM   Specimen: BLOOD  Result Value Ref Range Status   Specimen Description BLOOD RIGHT ASSIST CONTROL  Final   Special Requests   Final    BOTTLES DRAWN AEROBIC AND ANAEROBIC Blood Culture adequate volume   Culture   Final    NO GROWTH 2 DAYS Performed at [facility], [address], [place], [place] [address]    Report Status PENDING  Incomplete  Urine Culture (for pregnant, neutropenic or urologic patients or patients with an  indwelling urinary catheter)     Status: Abnormal   Collection Time: 09/27/22 12:45 PM   Specimen: Urine, Clean Catch  Result Value Ref Range Status   Specimen Description   Final    URINE, CLEAN CATCH Performed at [facility], [address], [place], [place] [address]    Special Requests   Final    NONE Performed at [facility], [address] [place], [place] [address]    Culture >=100,000 COLONIES/mL ESCHERICHIA COLI (A)  Final   Report Status 09/29/2022 FINAL  Final   Organism ID, Bacteria ESCHERICHIA COLI (A)  Final      Susceptibility   Escherichia coli - MIC*    AMPICILLIN <=2 SENSITIVE Sensitive     CEFAZOLIN <=4 SENSITIVE Sensitive     CEFEPIME <=0.12 SENSITIVE Sensitive     CEFTRIAXONE <=0.25 SENSITIVE Sensitive     CIPROFLOXACIN >=4 RESISTANT Resistant     GENTAMICIN <=1 SENSITIVE Sensitive     IMIPENEM <=0.25 SENSITIVE Sensitive     NITROFURANTOIN <=16 SENSITIVE Sensitive     TRIMETH/SULFA >=320 RESISTANT Resistant     AMPICILLIN/SULBACTAM <=2 SENSITIVE Sensitive     PIP/TAZO <=4 SENSITIVE Sensitive     * >=100,000 COLONIES/mL ESCHERICHIA COLI  Blood Culture (routine x 2)     Status: None (Preliminary result)   Collection Time: 09/27/22 12:55 PM   Specimen: BLOOD  Result Value Ref Range Status   Specimen Description BLOOD BLOOD LEFT ARM  Final   Special Requests   Final    Blood Culture adequate volume BOTTLES DRAWN AEROBIC AND ANAEROBIC   Culture   Final    NO GROWTH 2 DAYS Performed at [facility], [address] [place],  [address]    Report Status PENDING  Incomplete  Resp panel by RT-PCR (RSV, Flu A&B, Covid) Urine, Catheterized     Status: None   Collection Time: 09/27/22 12:59 PM   Specimen: Urine, Catheterized; Nasal Swab  Result Value Ref Range Status   SARS Coronavirus 2 by RT PCR NEGATIVE NEGATIVE Final    Comment: (NOTE) SARS-CoV-2 target nucleic acids are NOT DETECTED.  The SARS-CoV-2 RNA is generally detectable in upper  respiratory specimens during the acute phase of infection. The lowest concentration  of SARS-CoV-2 viral copies this assay can detect is 138 copies/mL. A negative result does not preclude SARS-Cov-2 infection and should not be used as the sole basis for treatment or other patient management decisions. A negative result may occur with  improper specimen collection/handling, submission of specimen other than nasopharyngeal swab, presence of viral mutation(s) within the areas targeted by this assay, and inadequate number of viral copies(<138 copies/mL). A negative result must be combined with clinical observations, patient history, and epidemiological information. The expected result is Negative.  Fact Sheet for Patients:  EntrepreneurPulse.com.au  Fact Sheet for Healthcare Providers:  IncredibleEmployment.be  This test is no t yet approved or cleared by the [place] FDA and  has been authorized for detection and/or diagnosis of SARS-CoV-2 by FDA under an Emergency Use Authorization (EUA). This EUA will remain  in effect (meaning this test can be used) for the duration of the COVID-19 declaration under Section 564(b)(1) of the Act, 21 U.S.C.section 360bbb-3(b)(1), unless the authorization is terminated  or revoked sooner.       Influenza A by PCR NEGATIVE NEGATIVE Final   Influenza B by PCR NEGATIVE NEGATIVE Final    Comment: (NOTE) The Xpert Xpress SARS-CoV-2/FLU/RSV plus assay is intended as an aid in the diagnosis of influenza from Nasopharyngeal swab specimens and should not be used as a sole basis for treatment. Nasal washings and aspirates are unacceptable for Xpert Xpress SARS-CoV-2/FLU/RSV testing.  Fact Sheet for Patients: EntrepreneurPulse.com.au  Fact Sheet for Healthcare Providers: IncredibleEmployment.be  This test is not yet approved or cleared by the [place] FDA and has been  authorized for detection and/or diagnosis of SARS-CoV-2 by FDA under an Emergency Use Authorization (EUA). This EUA will remain in effect (meaning this test can be used) for the duration of the COVID-19 declaration under Section 564(b)(1) of the Act, 21 U.S.C. section 360bbb-3(b)(1), unless the authorization is terminated or revoked.     Resp Syncytial Virus by PCR NEGATIVE NEGATIVE Final    Comment: (NOTE) Fact Sheet for Patients: EntrepreneurPulse.com.au  Fact Sheet for Healthcare Providers: IncredibleEmployment.be  This test is not yet approved or cleared by the [place] FDA and has been authorized for detection and/or diagnosis of SARS-CoV-2 by FDA under an Emergency Use Authorization (EUA). This EUA will remain in effect (meaning this test can be used) for the duration of the COVID-19 declaration under Section 564(b)(1) of the Act, 21 U.S.C. section 360bbb-3(b)(1), unless the authorization is terminated or revoked.  Performed at [facility], [address], [place], [place] [address]   MRSA Next Gen by PCR, Nasal     Status: None   Collection Time: 09/27/22  5:05 PM   Specimen: Nasal Mucosa; Nasal Swab  Result Value Ref Range Status   MRSA by PCR Next Gen NOT DETECTED NOT DETECTED Final    Comment: (NOTE) The GeneXpert MRSA Assay (FDA approved for NASAL specimens only), is one component of a comprehensive MRSA colonization surveillance program. It is not intended to diagnose MRSA infection nor to guide or monitor treatment for MRSA infections. Test performance is not FDA approved in patients less than 82 years old. Performed at [facility], [address], [place], [place] [address]   Urine Culture     Status: Abnormal (Preliminary result)   Collection Time: 09/27/22  6:03 PM   Specimen: Urine, Clean Catch  Result Value Ref Range Status   Specimen Description   Final    URINE, CLEAN CATCH Performed at [facility],  [address], [place], [place] [address]    Special Requests   Final    NONE Reflexed from [id number] Performed at [facility], [address] [place], [place] [address]    Culture (A)  Final    >=100,000 COLONIES/mL ESCHERICHIA COLI SUSCEPTIBILITIES TO FOLLOW Performed at [place] [address], [place], [place] [address]    Report Status PENDING  Incomplete         Radiology Studies: CT Head Wo Contrast  Result Date: 09/27/2022 CLINICAL DATA:  Provided history: Head trauma, minor. EXAM: CT HEAD WITHOUT CONTRAST TECHNIQUE: Contiguous axial images were obtained from the base of the skull through the vertex without intravenous contrast. RADIATION DOSE REDUCTION: This exam was performed according to the departmental dose-optimization program which includes automated exposure control, adjustment of the mA and/or kV according to patient size and/or use of iterative reconstruction technique. COMPARISON:  Prior head CT examinations 06/04/2022 and earlier. FINDINGS: Brain: Moderate cerebral atrophy. Small foci of chronic encephalomalacia/gliosis within the inferior frontal lobes bilaterally, at sites of prior parenchymal contusions. Moderate patchy and ill-defined hypoattenuation within the cerebral white matter, nonspecific but compatible with chronic small ischemic disease. There is no acute intracranial hemorrhage. No demarcated cortical infarct. No extra-axial fluid collection. No evidence of an intracranial mass. No midline shift. Vascular: No hyperdense vessel.  Atherosclerotic calcifications. Skull: Known non-acute, nondisplaced fracture of the parietooccipital calvarium. Sinuses/Orbits: No mass or acute finding within the imaged orbits. Small volume frothy secretions within a posterior left ethmoid air cell. IMPRESSION: 1.  No evidence of an acute intracranial abnormality. 2. Small foci of chronic encephalomalacia/gliosis within the inferior temporal lobes, bilaterally (at sites of prior  parenchymal contusions). 3. Moderate chronic small vessel ischemic changes within the cerebral white matter. 4. Moderate cerebral atrophy. 5. Known non-acute, nondisplaced fracture of the parietooccipital calvarium. 6. Mild left ethmoid sinusitis. Electronically Signed   By: Kellie Simmering D.O.   On: 09/27/2022 14:20   CT PELVIS WO CONTRAST  Result Date: 09/27/2022 CLINICAL DATA:  Status post fall, hip pain EXAM: CT PELVIS WITHOUT CONTRAST TECHNIQUE: Multidetector CT imaging of the pelvis was performed following the standard protocol without intravenous contrast. RADIATION DOSE REDUCTION: This exam was performed according to the departmental dose-optimization program which includes automated exposure control, adjustment of the mA and/or kV according to patient size and/or use of iterative reconstruction technique. COMPARISON:  Pelvic radiograph dated 03/01/2022 FINDINGS: Urinary Tract: Bladder is decompressed by an indwelling Foley catheter. Bowel:  Sigmoid diverticulosis, without evidence of diverticulitis. Vascular/Lymphatic: No evidence of aneurysm. Atherosclerotic calcifications the abdominal aorta and branch vessels. No suspicious pelvic lymphadenopathy. Reproductive: Prostate is unremarkable. Penile prosthesis with reservoir in the right anterior lower pelvis (series 7/image 80). Other:  No pelvic ascites. Musculoskeletal: Status post ORIF of the right hip, without evidence of complication. Visualized bony pelvis appears intact. Moderate degenerative changes of the left hip. Degenerative changes of the lower lumbar spine, most prominent at L4-5. IMPRESSION: Status post ORIF of the right hip, without evidence of complication. No fracture or dislocation is seen. Additional ancillary findings as above. Aortic Atherosclerosis (ICD10-I70.0). Electronically Signed   By: Julian Hy M.D.   On: 09/27/2022 14:20   CT Cervical Spine Wo Contrast  Result Date: 09/27/2022 CLINICAL DATA:  Neck pain after  falling. Recent urinary tract infection diagnosis. EXAM: CT CERVICAL SPINE WITHOUT CONTRAST TECHNIQUE: Multidetector CT imaging of the cervical spine was performed without intravenous contrast. Multiplanar CT image reconstructions were also generated. RADIATION DOSE  REDUCTION: This exam was performed according to the departmental dose-optimization program which includes automated exposure control, adjustment of the mA and/or kV according to patient size and/or use of iterative reconstruction technique. COMPARISON:  Cervical spine CT 06/02/2022. FINDINGS: Despite efforts by the technologist and patient, mild motion artifact is present on today's exam and could not be eliminated. This reduces exam sensitivity and specificity. Alignment: Straightening without focal angulation or significant listhesis. Skull base and vertebrae: No evidence of acute fracture or traumatic subluxation. There is multilevel cervical spondylosis with disc space narrowing, uncinate spurring and facet hypertrophy. Ossification of the ligamentum nuchae noted. Soft tissues and spinal canal: No prevertebral fluid or swelling. No visible canal hematoma. Disc levels: Multilevel spondylosis with disc space narrowing, uncinate spurring and facet hypertrophy, similar to previous study. Resulting chronic mild to moderate foraminal narrowing at multiple levels. No large disc herniation identified. Upper chest: Emphysematous changes at the lung apices. Other: Bilateral carotid atherosclerosis. IMPRESSION: 1. No evidence of acute cervical spine fracture, traumatic subluxation or static signs of instability. 2. Multilevel cervical spondylosis, similar to previous study. Electronically Signed   By: Richardean Sale M.D.   On: 09/27/2022 14:15   DG Chest Port 1 View  Result Date: 09/27/2022 CLINICAL DATA:  Possible sepsis. EXAM: PORTABLE CHEST 1 VIEW COMPARISON:  Chest radiograph 03/01/2022. FINDINGS: The left chest wall cardiac device is stable. The  cardiomediastinal silhouette is stable. There is no focal consolidation or pulmonary edema. There is no pleural effusion or pneumothorax There is possible prior fracture of the left clavicle. There is a left proximal humeral fracture which was acute on the radiographs from 08/26/2022. There is no new acute osseous abnormality. IMPRESSION: No radiographic evidence of acute cardiopulmonary process. Electronically Signed   By: Valetta Mole M.D.   On: 09/27/2022 13:26        Scheduled Meds:  Chlorhexidine Gluconate Cloth  6 each Topical Daily   diltiazem  30 mg Oral Q12H   divalproex  125 mg Oral QHS   finasteride  5 mg Oral Daily   metoprolol tartrate  25 mg Oral BID   midodrine  10 mg Oral BID WC   pantoprazole  40 mg Oral Daily   Continuous Infusions:  cefTRIAXone (ROCEPHIN)  IV 2 g (09/29/22 0837)   diltiazem (CARDIZEM) infusion Stopped (09/28/22 0424)   ferric gluconate (FERRLECIT) IVPB 250 mg (09/28/22 1122)     LOS: 2 days    Time spent: 35 minutes    Briah Nary Darleen Crocker, DO [facility]  If 7PM-7AM, please contact night-coverage [email or web address] 09/29/2022, 1:02 PM

## 2022-09-29 NOTE — Evaluation (Signed)
Physical Therapy Evaluation Patient Details Name: [facility] MRN: [id number] DOB: 1930/12/16 Today's Date: 09/29/2022  History of Present Illness  [name] is a 89 y.o. male with medical history significant for vascular dementia, atrial fibrillation, SSS with pacemaker, hypertension, coronary artery disease, CKD 3A, Subural hematoma.  Patient presented to the ED from [facility] memory unit reports of fever and altered mental status.  At the time of my evaluation, patient is awake, alert and oriented to person, daughter Maudie Mason who is HCPOA is at bedside and reports that patient's mental status is at his baseline.  Had fever up to 103 at the nursing home.  He was diagnosed with a UTI yesterday but has not been started on antibiotics yet.  He has a history of frequent falls, he fell a few days ago after which he has been complaining of pelvic pain.  She also reports generalized weakness and thinks this may be contributing to his more recent falls.  Clinical Impression  PT is in the memory unit at [facility] and has been falling with increased frequency.  Pt will follow command and is appropriate for skilled PT to improve his strength and safety.         Recommendations for follow up therapy are one component of a multi-disciplinary discharge planning process, led by the attending physician.  Recommendations may be updated based on patient status, additional functional criteria and insurance authorization.  Follow Up Recommendations Home health PT- Therapist stated SNF but pt daugher does not want this.        Assistance Recommended at Discharge    Patient can return home with the following  A lot of help with walking and/or transfers;Assistance with cooking/housework;Direct supervision/assist for medications management;Assist for transportation;Help with stairs or ramp for entrance    Equipment Recommendations Other (comment)  Recommendations for Other Services   (daughter  states she does not want SNF wants pt to retiurn to [facility] with [facility])    Functional Status Assessment Patient has had a recent decline in their functional status and demonstrates the ability to make significant improvements in function in a reasonable and predictable amount of time.     Precautions / Restrictions Precautions Precautions: Fall Required Braces or Orthoses: Sling Restrictions Weight Bearing Restrictions: Yes LUE Weight Bearing: Weight bearing as tolerated      Mobility  Bed Mobility Overal bed mobility: Needs Assistance Bed Mobility: Supine to Sit, Sit to Supine     Supine to sit: Mod assist Sit to supine: Mod assist        Transfers Overall transfer level: Needs assistance   Transfers: Sit to/from Stand Sit to Stand: Min assist                Ambulation/Gait     Assistive device: Hemi-walker         General Gait Details: side stepping only as pt is fatigued                 Pertinent Vitals/Pain Pain Assessment Pain Assessment: Faces Faces Pain Scale: Hurts little more    Home Living Family/patient expects to be discharged to:: Assisted living (memory unit)                 Home Equipment: Kasandra Knudsen - single point Additional Comments: tripod cane    Prior Function Prior Level of Function : Needs assist       Physical Assist : Mobility (physical);ADLs (physical) Mobility (physical): Bed mobility;Transfers;Gait;Stairs ADLs (physical): Dressing;Grooming Mobility  Comments: pt has fallen multiple times ADLs Comments: Pt resides at facility- per pt last admission he required some assist for dressing and independently completed toileting     Hand Dominance        Extremity/Trunk Assessment        Lower Extremity Assessment Lower Extremity Assessment: Generalized weakness       Communication   Communication: No difficulties  Cognition Arousal/Alertness: Awake/alert Behavior During Therapy: WFL for tasks  assessed/performed Overall Cognitive Status: History of cognitive impairments - at baseline                                          General Comments      Exercises General Exercises - Lower Extremity Ankle Circles/Pumps: Both, 5 reps Long Arc Quad: Both, 5 reps Heel Slides: Both, 5 reps Hip ABduction/ADduction: Both, 5 reps, AAROM Straight Leg Raises: Both, 5 reps, AAROM   Assessment/Plan    PT Assessment Patient needs continued PT services  PT Problem List Decreased strength;Decreased balance;Pain       PT Treatment Interventions Therapeutic activities;Gait training;Therapeutic exercise    PT Goals (Current goals can be found in the Care Plan section)  Acute Rehab PT Goals Patient Stated Goal: Unable to states; daughter desires HH to come to [place] to improve fathers safety. PT Goal Formulation: With family Time For Goal Achievement: 10/28/22 Potential to Achieve Goals: Good    Frequency Min 3X/week        AM-PAC PT "6 Clicks" Mobility  Outcome Measure Help needed turning from your back to your side while in a flat bed without using bedrails?: A Lot Help needed moving from lying on your back to sitting on the side of a flat bed without using bedrails?: A Lot Help needed moving to and from a bed to a chair (including a wheelchair)?: A Lot Help needed standing up from a chair using your arms (e.g., wheelchair or bedside chair)?: A Lot Help needed to walk in hospital room?: A Lot Help needed climbing 3-5 steps with a railing? : A Lot 6 Click Score: 12    End of Session Equipment Utilized During Treatment: Gait belt;Other (comment) (sling for RT UE) Activity Tolerance: Patient limited by fatigue Patient left: in bed;with call bell/phone within reach;with bed alarm set;with family/visitor present   PT Visit Diagnosis: Unsteadiness on feet (R26.81);Repeated falls (R29.6);Muscle weakness (generalized) (M62.81)    Time: 1600-1630 PT Time  Calculation (min) (ACUTE ONLY): 30 min   Charges:   PT Evaluation $PT Eval Low Complexity: 1 Low PT Treatments $Therapeutic Exercise: 8-22 mins      Davaris Wigton, PT CLT [phone]  09/29/2022, 4:45 PM

## 2022-09-29 NOTE — Progress Notes (Signed)
Patient slept the whole shift. He took medication without difficulty. Continued to monitor.

## 2022-09-29 NOTE — Progress Notes (Signed)
Call from central tele at 2135, pt had 6 beat run VT.  Had just left pt room.  Pt stable, resting, had taken pills and repositioned.  Skin warm and dry.  No CP/SHOB.  Provider notified, no new orders.  Plan of care ongoing.

## 2022-09-29 NOTE — Care Management Important Message (Signed)
Important Message  Patient Details  Name: ISAM ZANGER MRN: [id number] Date of Birth: December 01, 1930   Medicare Important Message Given:  N/A - LOS <3 / Initial given by admissions     [name] 09/29/2022, 10:47 AM

## 2022-09-30 DIAGNOSIS — I4891 Unspecified atrial fibrillation: Secondary | ICD-10-CM | POA: Diagnosis not present

## 2022-09-30 LAB — URINE CULTURE: Culture: >=100000 — AB

## 2022-09-30 LAB — BASIC METABOLIC PANEL
Anion gap: 10 (ref 5–15)
BUN: 44 mg/dL — ABNORMAL HIGH (ref 8–23)
CO2: 23 mmol/L (ref 22–32)
Calcium: 8.4 mg/dL — ABNORMAL LOW (ref 8.9–10.3)
Chloride: 105 mmol/L (ref 98–111)
Creatinine, Ser: 1.85 mg/dL — ABNORMAL HIGH (ref 0.61–1.24)
GFR, Estimated: 34 mL/min — ABNORMAL LOW (ref >60)
Glucose, Bld: 94 mg/dL (ref 70–99)
Potassium: 3.9 mmol/L (ref 3.5–5.1)
Sodium: 138 mmol/L (ref 135–145)

## 2022-09-30 LAB — CBC
HCT: 23.4 % — ABNORMAL LOW (ref 39.0–52.0)
Hemoglobin: 7.1 g/dL — ABNORMAL LOW (ref 13.0–17.0)
MCH: 32.4 pg (ref 26.0–34.0)
MCHC: 30.3 g/dL (ref 30.0–36.0)
MCV: 106.8 fL — ABNORMAL HIGH (ref 80.0–100.0)
Platelets: 190 10*3/uL (ref 150–400)
RBC: 2.19 MIL/uL — ABNORMAL LOW (ref 4.22–5.81)
RDW: 14.2 % (ref 11.5–15.5)
WBC: 9.4 10*3/uL (ref 4.0–10.5)
nRBC: 0.3 % — ABNORMAL HIGH (ref 0.0–0.2)

## 2022-09-30 LAB — ABO/RH: ABO/RH(D): O NEG

## 2022-09-30 LAB — MAGNESIUM: Magnesium: 1.6 mg/dL — ABNORMAL LOW (ref 1.7–2.4)

## 2022-09-30 LAB — PREPARE RBC (CROSSMATCH)

## 2022-09-30 LAB — HEMOGLOBIN AND HEMATOCRIT, BLOOD
HCT: 29.7 % — ABNORMAL LOW (ref 39.0–52.0)
Hemoglobin: 9.2 g/dL — ABNORMAL LOW (ref 13.0–17.0)

## 2022-09-30 MED ORDER — SODIUM CHLORIDE 0.9% IV SOLUTION: Freq: Once | INTRAVENOUS | Status: AC

## 2022-09-30 MED ORDER — MAGNESIUM SULFATE 2 GM/50ML IV SOLN
2.0000 g | Freq: Once | INTRAVENOUS | Status: AC
  Administered 2022-09-30: 2 g via INTRAVENOUS
  Filled 2022-09-30: qty 50

## 2022-09-30 MED ORDER — DILTIAZEM HCL 30 MG PO TABS
30.0000 mg | ORAL_TABLET | Freq: Three times a day (TID) | ORAL | Status: DC
  Administered 2022-09-30 – 2022-10-01 (×4): 30 mg via ORAL
  Filled 2022-09-30 (×4): qty 1

## 2022-09-30 NOTE — Progress Notes (Signed)
Pt has rested well for remainder of shift.  Continues to be AFIB on monitor with occasional ectopy.  Resps even, unlabored.  Left shoulder sling in place.  Foley patent.  Private CG at bedside.

## 2022-09-30 NOTE — Care Management Important Message (Signed)
Important Message  Patient Details  Name: Benjamin [facility] MRN: [id number] Date of Birth: 1930/09/17   Medicare Important Message Given:  Yes     [name] 09/30/2022, 11:26 AM

## 2022-09-30 NOTE — Progress Notes (Signed)
PROGRESS NOTE    Ben[facility]  [id number] DOB: 1930/08/29 DOA: 09/27/2022 PCP: Merryl Hacker, No   Brief Narrative:   Benjamin Mason is a 89 y.o. male with medical history significant for vascular dementia, atrial fibrillation, SSS with pacemaker, hypertension, coronary artery disease, CKD 3A, Subural hematoma. Patient presented to the ED from [facility] reports of fever and altered mental status.  He was admitted with atrial fibrillation with RVR in the setting of sepsis with UTI. He apparently has E. coli UTI and has completed treatment with Rocephin 2/16.  PT evaluation pending as they would like to try SNF/rehab.  TOC working on SNF placement and patient will require 1 Mason PRBC transfusion as hemoglobin is quite low.  No overt bleeding noted.  Assessment & Plan:   Principal Problem:   Atrial fibrillation with RVR (HCC) Active Problems:   Sepsis due to urinary tract infection (HCC)   Acute on chronic anemia   Acute kidney injury superimposed on chronic kidney disease (HCC)   Essential hypertension, benign   HTN (hypertension)   Fall   Hypomagnesemia   Vascular dementia ([facility])   Depression   Acute urinary retention  Assessment and Plan:   Atrial fibrillation with RVR-improving Heart rates up to 170s.  Currently controlled in the low 100s.  Likely driven by sepsis, in the setting of hypomagnesemia history of atrial fibrillation not on anticoagulation, Eliquis was discontinued after subdural hematoma 05/2022, Eliquis had to be reversed.  No improvement in heart rates with IV metoprolol 5 mg.  Last echo 2020- EF 55 to 60%. -Continue home metoprolol and increase oral Cardizem 30 mg to 3 times daily -Should improve further with 1 [facility] PRBC transfusion today -Continue telemetry monitoring.   Acute on chronic anemia Hemoglobin 7.1, baseline 8-10.  Stool occult negative.  Denies GI blood loss.  Anemia panel suggest both iron deficiency anemia and anemia of  chronic disease. -Transfused IV iron - Trend Hgb-stable, but now borderline and will transfuse 1 Mason PRBC today -Monitor repeat CBC   Sepsis due to E. coli urinary tract infection ([facility]) Meeting sepsis criteria with fever of 101.7, leukocytosis of 15.1, and tachycardia heart rates  100s to 170s.  Lactic acid 2 > 1.6.  No recent urine cultures -Completed 3 days of IV Rocephin 2/16   Acute kidney injury superimposed on chronic kidney disease ([facility]) AKI on CKD stage IIIb.  Creatinine elevated 2.34, baseline 1.5-1.9.  Acute urinary retention with 1 L of urine in bladder. -Continues to require Foley for acute urinary retention as below -No further need for IV fluid, should continue to have improved creatinine levels with PRBC transfusion   Acute urinary retention 1 L of urine drained from bladder.   -Foley catheter required at this time, will need outpatient urology follow-up   Vascular dementia [facility]) Per daughter status is currently at baseline.  He is alert and oriented to person.  He resides at [facility] facility in the [facility]. -Resume Depakote, -Hold Zoloft for now with QT prolongation of [place] a few days ago, head cervical CT negative for acute abnormality, complains of pelvic pain, but CT pelvis negative for fracture.-Sling to left shoulder from fracture of humeral neck 08/2022. -PT already reevaluated, TOC working on SNF placement and [facility] approved     Essential hypertension, benign Soft to stable. -Resume midodrine 10 mg twice daily -Started on Cardizem 3 times daily, monitor -Resume metoprolol 25 mg twice daily  DVT prophylaxis:SCDs Code Status: DNR Family Communication: Daughter at on phone 2/16 Disposition Plan: Anticipate discharge to [facility] over the weekend if heart rates are better controlled and patient more stabilized Status is: Inpatient Remains inpatient appropriate because: Need for IV medications.   Consultants:  Palliative  care  Procedures:  None  Antimicrobials:  Anti-infectives (From admission, onward)    Start     Dose/Rate Route Frequency Ordered Stop   09/28/22 1000  ceFEPIme (MAXIPIME) 2 g in sodium chloride 0.9 % 100 mL IVPB  Status:  Discontinued        2 g 200 mL/hr over 30 Minutes Intravenous Every 24 hours 09/27/22 1244 09/27/22 1332   09/28/22 1000  ceFEPIme (MAXIPIME) 1 g in sodium chloride 0.9 % 100 mL IVPB  Status:  Discontinued        1 g 200 mL/hr over 30 Minutes Intravenous Every 24 hours 09/27/22 1332 09/27/22 1750   09/28/22 1000  cefTRIAXone (ROCEPHIN) 2 g in sodium chloride 0.9 % 100 mL IVPB        2 g 200 mL/hr over 30 Minutes Intravenous Every 24 hours 09/27/22 1749     09/27/22 1230  ceFEPIme (MAXIPIME) 2 g in sodium chloride 0.9 % 100 mL IVPB        2 g 200 mL/hr over 30 Minutes Intravenous  Once 09/27/22 1221 09/27/22 1338      Subjective: Patient seen and evaluated today with no new acute complaints or concerns. No acute concerns or events noted overnight.  Patient is coherent and denies any complaints.  Objective: Vitals:   09/29/22 1539 09/29/22 2043 09/30/22 0402 09/30/22 0856  BP: 137/84 127/63 130/75 (!) 141/106  Pulse: 100 (!) 106 (!) 103 89  Resp:  18 18 18  $ Temp: 98.5 F (36.9 C) 98.2 F (36.8 C) 97.7 F (36.5 C)   TempSrc: Oral  Oral   SpO2: 94% 93% 91% 92%  Weight:      Height:        Intake/Output Summary (Last 24 hours) at 09/30/2022 0944 Last data filed at 09/30/2022 0500 Gross per 24 hour  Intake 820 ml  Output 2300 ml  Net -1480 ml   Filed Weights   09/27/22 1125 09/27/22 1700  Weight: 66.7 kg 70.7 kg    Examination:  General exam: Appears calm and comfortable  Respiratory system: Clear to auscultation. Respiratory effort normal.  Cardiovascular system: S1 & S2 heard, irregular rate. Gastrointestinal system: Abdomen is soft Central nervous system: Alert and awake Extremities: No edema Skin: No significant lesions noted Psychiatry:  Flat affect. Foley with clear, yellow urine output    Data Reviewed: I have personally reviewed following labs and imaging studies  CBC: Recent Labs  Lab 09/27/22 1205 09/28/22 0446 09/29/22 0434 09/30/22 0433  WBC 15.1* 13.3* 10.3 9.4  NEUTROABS 13.9*  --   --   --   HGB 7.3* 7.2* 7.3* 7.1*  HCT 23.7* 23.8* 24.5* 23.4*  MCV 109.7* 109.2* 109.4* 106.8*  PLT 207 182 187 [id number]   Basic Metabolic Panel: Recent Labs  Lab 09/27/22 1205 09/28/22 0446 09/29/22 0434 09/30/22 0433  NA 140 139 138 138  K 4.0 4.5 4.1 3.9  CL 112* 109 109 105  CO2 17* 21* 21* 23  GLUCOSE 93 105* 98 94  BUN 53* 60* 52* 44*  CREATININE 2.34* 2.47* 2.14* 1.85*  CALCIUM 7.2* 8.5* 8.5* 8.4*  MG 1.2* 1.9 1.8 1.6*   GFR: Estimated Creatinine Clearance: 27.1 mL/min (A) (  by C-G formula based on SCr of 1.85 mg/dL (H)). Liver Function Tests: Recent Labs  Lab 09/27/22 1205  AST 26  ALT 16  ALKPHOS 58  BILITOT 0.7  PROT 5.7*  ALBUMIN 2.2*   No results for input(s): "LIPASE", "AMYLASE" in the last 168 hours. No results for input(s): "AMMONIA" in the last 168 hours. Coagulation Profile: No results for input(s): "INR", "PROTIME" in the last 168 hours. Cardiac Enzymes: No results for input(s): "CKTOTAL", "CKMB", "CKMBINDEX", "TROPONINI" in the last 168 hours. BNP (last 3 results) No results for input(s): "PROBNP" in the last 8760 hours. HbA1C: No results for input(s): "HGBA1C" in the last 72 hours. CBG: No results for input(s): "GLUCAP" in the last 168 hours. Lipid Profile: No results for input(s): "CHOL", "HDL", "LDLCALC", "TRIG", "CHOLHDL", "LDLDIRECT" in the last 72 hours. Thyroid Function Tests: No results for input(s): "TSH", "T4TOTAL", "FREET4", "T3FREE", "THYROIDAB" in the last 72 hours. Anemia Panel: Recent Labs    09/27/22 1205  FERRITIN 746*  TIBC 193*  IRON 10*   Sepsis Labs: Recent Labs  Lab 09/27/22 1205 09/27/22 1324  LATICACIDVEN 2.0* 1.6    Recent Results (from the  past 240 hour(s))  Blood Culture (routine x 2)     Status: None (Preliminary result)   Collection Time: 09/27/22 12:05 PM   Specimen: BLOOD  Result Value Ref Range Status   Specimen Description BLOOD RIGHT ASSIST CONTROL  Final   Special Requests   Final    BOTTLES DRAWN AEROBIC AND ANAEROBIC Blood Culture adequate volume   Culture   Final    NO GROWTH 2 DAYS Performed at [facility], [address], [place], [place] [address]    Report Status PENDING  Incomplete  Urine Culture (for pregnant, neutropenic or urologic patients or patients with an indwelling urinary catheter)     Status: Abnormal   Collection Time: 09/27/22 12:45 PM   Specimen: Urine, Clean Catch  Result Value Ref Range Status   Specimen Description   Final    URINE, CLEAN CATCH Performed at [facility], [address], [place], [place] [address]    Special Requests   Final    NONE Performed at [facility], [address] [place], [place] [address]    Culture >=100,000 COLONIES/mL ESCHERICHIA COLI (A)  Final   Report Status 09/29/2022 FINAL  Final   Organism ID, Bacteria ESCHERICHIA COLI (A)  Final      Susceptibility   Escherichia coli - MIC*    AMPICILLIN <=2 SENSITIVE Sensitive     CEFAZOLIN <=4 SENSITIVE Sensitive     CEFEPIME <=0.12 SENSITIVE Sensitive     CEFTRIAXONE <=0.25 SENSITIVE Sensitive     CIPROFLOXACIN >=4 RESISTANT Resistant     GENTAMICIN <=1 SENSITIVE Sensitive     IMIPENEM <=0.25 SENSITIVE Sensitive     NITROFURANTOIN <=16 SENSITIVE Sensitive     TRIMETH/SULFA >=320 RESISTANT Resistant     AMPICILLIN/SULBACTAM <=2 SENSITIVE Sensitive     PIP/TAZO <=4 SENSITIVE Sensitive     * >=100,000 COLONIES/mL ESCHERICHIA COLI  Blood Culture (routine x 2)     Status: None (Preliminary result)   Collection Time: 09/27/22 12:55 PM   Specimen: BLOOD  Result Value Ref Range Status   Specimen Description BLOOD BLOOD LEFT ARM  Final   Special Requests   Final    Blood Culture adequate volume BOTTLES  DRAWN AEROBIC AND ANAEROBIC   Culture   Final    NO GROWTH 2 DAYS Performed at [facility], [address], [place], [place] [address]    Report Status PENDING  Incomplete  Resp panel by RT-PCR (RSV, Flu A&B, Covid) Urine, Catheterized     Status: None   Collection Time: 09/27/22 12:59 PM   Specimen: Urine, Catheterized; Nasal Swab  Result Value Ref Range Status   SARS Coronavirus 2 by RT PCR NEGATIVE NEGATIVE Final    Comment: (NOTE) SARS-CoV-2 target nucleic acids are NOT DETECTED.  The SARS-CoV-2 RNA is generally detectable in upper respiratory specimens during the acute phase of infection. The lowest concentration of SARS-CoV-2 viral copies this assay can detect is 138 copies/mL. A negative result does not preclude SARS-Cov-2 infection and should not be used as the sole basis for treatment or other patient management decisions. A negative result may occur with  improper specimen collection/handling, submission of specimen other than nasopharyngeal swab, presence of viral mutation(s) within the areas targeted by this assay, and inadequate number of viral copies(<138 copies/mL). A negative result must be combined with clinical observations, patient history, and epidemiological information. The expected result is Negative.  Fact Sheet for Patients:  EntrepreneurPulse.com.au  Fact Sheet for Healthcare Providers:  IncredibleEmployment.be  This test is no t yet approved or cleared by the [place] FDA and  has been authorized for detection and/or diagnosis of SARS-CoV-2 by FDA under an Emergency Use Authorization (EUA). This EUA will remain  in effect (meaning this test can be used) for the duration of the COVID-19 declaration under Section 564(b)(1) of the Act, 21 U.S.C.section 360bbb-3(b)(1), unless the authorization is terminated  or revoked sooner.       Influenza A by PCR NEGATIVE NEGATIVE Final   Influenza B by PCR NEGATIVE  NEGATIVE Final    Comment: (NOTE) The Xpert Xpress SARS-CoV-2/FLU/RSV plus assay is intended as an aid in the diagnosis of influenza from Nasopharyngeal swab specimens and should not be used as a sole basis for treatment. Nasal washings and aspirates are unacceptable for Xpert Xpress SARS-CoV-2/FLU/RSV testing.  Fact Sheet for Patients: EntrepreneurPulse.com.au  Fact Sheet for Healthcare Providers: IncredibleEmployment.be  This test is not yet approved or cleared by the [place] FDA and has been authorized for detection and/or diagnosis of SARS-CoV-2 by FDA under an Emergency Use Authorization (EUA). This EUA will remain in effect (meaning this test can be used) for the duration of the COVID-19 declaration under Section 564(b)(1) of the Act, 21 U.S.C. section 360bbb-3(b)(1), unless the authorization is terminated or revoked.     Resp Syncytial Virus by PCR NEGATIVE NEGATIVE Final    Comment: (NOTE) Fact Sheet for Patients: EntrepreneurPulse.com.au  Fact Sheet for Healthcare Providers: IncredibleEmployment.be  This test is not yet approved or cleared by the [place] FDA and has been authorized for detection and/or diagnosis of SARS-CoV-2 by FDA under an Emergency Use Authorization (EUA). This EUA will remain in effect (meaning this test can be used) for the duration of the COVID-19 declaration under Section 564(b)(1) of the Act, 21 U.S.C. section 360bbb-3(b)(1), unless the authorization is terminated or revoked.  Performed at [facility], [address], [place], [place] [address]   MRSA Next Gen by PCR, Nasal     Status: None   Collection Time: 09/27/22  5:05 PM   Specimen: Nasal Mucosa; Nasal Swab  Result Value Ref Range Status   MRSA by PCR Next Gen NOT DETECTED NOT DETECTED Final    Comment: (NOTE) The GeneXpert MRSA Assay (FDA approved for NASAL specimens only), is one component of a  comprehensive MRSA colonization  surveillance program. It is not intended to diagnose MRSA infection nor to guide or monitor treatment for MRSA infections. Test performance is not FDA approved in patients less than 28 years old. Performed at [facility], [address] [place], [place] [address]   Urine Culture     Status: Abnormal   Collection Time: 09/27/22  6:03 PM   Specimen: Urine, Clean Catch  Result Value Ref Range Status   Specimen Description   Final    URINE, CLEAN CATCH Performed at [facility], [place] [address], [place], [place] [address]    Special Requests   Final    NONE Reflexed from [phone] Performed at [facility], [address], [place], [place] [address]    Culture >=100,000 COLONIES/mL ESCHERICHIA COLI (A)  Final   Report Status 09/30/2022 FINAL  Final   Organism ID, Bacteria ESCHERICHIA COLI (A)  Final      Susceptibility   Escherichia coli - MIC*    AMPICILLIN <=2 SENSITIVE Sensitive     CEFAZOLIN <=4 SENSITIVE Sensitive     CEFEPIME <=0.12 SENSITIVE Sensitive     CEFTRIAXONE <=0.25 SENSITIVE Sensitive     CIPROFLOXACIN >=4 RESISTANT Resistant     GENTAMICIN <=1 SENSITIVE Sensitive     IMIPENEM <=0.25 SENSITIVE Sensitive     NITROFURANTOIN <=16 SENSITIVE Sensitive     TRIMETH/SULFA >=320 RESISTANT Resistant     AMPICILLIN/SULBACTAM <=2 SENSITIVE Sensitive     PIP/TAZO <=4 SENSITIVE Sensitive     * >=100,000 COLONIES/mL ESCHERICHIA COLI         Radiology Studies: No results found.      Scheduled Meds:  Chlorhexidine Gluconate Cloth  6 each Topical Daily   diltiazem  30 mg Oral Q12H   divalproex  125 mg Oral QHS   finasteride  5 mg Oral Daily   metoprolol tartrate  25 mg Oral BID   midodrine  10 mg Oral BID WC   pantoprazole  40 mg Oral Daily   Continuous Infusions:  cefTRIAXone (ROCEPHIN)  IV 2 g (09/30/22 0844)   diltiazem (CARDIZEM) infusion Stopped (09/28/22 0424)   magnesium sulfate bolus IVPB       LOS: 3 days    Time  spent: 35 minutes    Jamari Moten Darleen Crocker, DO [facility]  If 7PM-7AM, please contact night-coverage [email or web address] 09/30/2022, 9:44 AM

## 2022-09-30 NOTE — Evaluation (Signed)
Clinical/Bedside Swallow Evaluation Patient Details  Name: Benjamin Mason MRN: [id number] Date of Birth: 02-13-1931  Today's Date: 09/30/2022 Time: SLP Start Time (ACUTE ONLY): [id number] SLP Stop Time (ACUTE ONLY): 1410 SLP Time Calculation (min) (ACUTE ONLY): 21 min  Past Medical History:  Past Medical History:  Diagnosis Date   Anxiety    Aortic regurgitation    Mild to moderate   CKD (chronic kidney disease) stage 3, GFR 30-59 ml/min (HCC)    Coronary atherosclerosis of native coronary artery    BMS to RCA and LAD 1995   Essential hypertension    Frequent PVCs    IBS (irritable bowel syndrome)    Lactose intolerance    Mitral regurgitation    Moderate   Mixed hyperlipidemia    Permanent atrial fibrillation (HCC)    Sick sinus syndrome ([place])    St. Jude single-chamber pacemaker May 2021 - Dr. Caryl Comes   Skin cancer    Skull fracture [facility])    Past Surgical History:  Past Surgical History:  Procedure Laterality Date   BALLOON DILATION  08/03/2012   Procedure: BALLOON DILATION;  Surgeon: Rogene Houston, MD;  [facility];  Service: Endoscopy;  Laterality: N/A;   COLONOSCOPY WITH ESOPHAGOGASTRODUODENOSCOPY (EGD)  08/03/2012   Procedure: COLONOSCOPY WITH ESOPHAGOGASTRODUODENOSCOPY (EGD);  Surgeon: Rogene Houston, MD;  [facility];  Service: Endoscopy;  Laterality: N/A;  215   LEFT HEART CATH AND CORONARY ANGIOGRAPHY N/A 11/18/2016   Procedure: Left Heart Cath and Coronary Angiography;  Surgeon: Peter M Martinique, MD;  Location: [place] [facility];  Service: Cardiovascular;  Laterality: N/A;   LEFT INGUINAL HERNIA     MALONEY DILATION  08/03/2012   Procedure: MALONEY DILATION;  Surgeon: Rogene Houston, MD;  [facility];  Service: Endoscopy;  Laterality: N/A;   PACEMAKER IMPLANT N/A 12/30/2019   Procedure: PACEMAKER IMPLANT;  Surgeon: Deboraha Sprang, MD;  Location: [place] [facility];  Service: Cardiovascular;  Laterality: N/A;   PENILE PROSTHESIS  IMPLANT     SAVORY DILATION  08/03/2012   Procedure: SAVORY DILATION;  Surgeon: Rogene Houston, MD;  [facility];  Service: Endoscopy;  Laterality: N/A;   HPI:  Benjamin Mason is a 89 y.o. male with medical history significant for vascular dementia, atrial fibrillation, SSS with pacemaker, hypertension, coronary artery disease, CKD 3A, Subural hematoma.  Patient presented to the ED from [facility] memory unit reports of fever and altered mental status.  He was admitted with atrial fibrillation with RVR in the setting of sepsis with UTI. He apparently has E. coli UTI and has completed treatment with Rocephin 2/16.  PT evaluation pending as they would like to try SNF/rehab.  TOC working on SNF placement and patient will require 1 unit PRBC transfusion as hemoglobin is quite low. BSE requested,   Assessment / Plan / Recommendation  Clinical Impression  Clinical swallowing evaluation completed while Pt was sitting upright in bed; Pt known to SLP from BSE completed 05/2022. Pt with known esophageal dysphagia and reports of "coughing/choking episodes" with liquids and medications. Trials of thin liquids, regular textures and puree textures reveal no overt s/sx of aspiration. Pt required occasional cues to take his time and remain in optimal swallowing positioning (upright). Pt is at risk for aspiration and malnutrition and dehydration secondary to dementia. SLP reviewed universal aspiration precautions and reflux precautions with Pt's daughter who was at bedside for evaluation. Recommed downgrade Pt to D3/mech soft  diet and continue with thin liquids. Pt should be seated upright for all PO. There are no further ST needs noted at this time, ST will sign off thank you. SLP Visit Diagnosis: Dysphagia, unspecified (R13.10)    Aspiration Risk  Mild aspiration risk    Diet Recommendation Dysphagia 3 (Mech soft);Thin liquid   Liquid Administration via: Cup;Straw Medication  Administration: Whole meds with liquid Supervision: Patient able to self feed Compensations: Minimize environmental distractions Postural Changes: Seated upright at 90 degrees    Other  Recommendations Oral Care Recommendations: Oral care BID    Recommendations for follow up therapy are one component of a multi-disciplinary discharge planning process, led by the attending physician.  Recommendations may be updated based on patient status, additional functional criteria and insurance authorization.  Follow up Recommendations No SLP follow up        [place] Date of Onset: 09/27/22 HPI: Benjamin Mason is a 89 y.o. male with medical history significant for vascular dementia, atrial fibrillation, SSS with pacemaker, hypertension, coronary artery disease, CKD 3A, Subural hematoma.  Patient presented to the ED from [facility] memory unit reports of fever and altered mental status.  He was admitted with atrial fibrillation with RVR in the setting of sepsis with UTI. He apparently has E. coli UTI and has completed treatment with Rocephin 2/16.  PT evaluation pending as they would like to try SNF/rehab.  TOC working on SNF placement and patient will require 1 unit PRBC transfusion as hemoglobin is quite low. Type of Study: Bedside Swallow Evaluation Previous Swallow Assessment: BSE 05/2022 Diet Prior to this Study: Regular;Thin liquids (Level 0) Temperature Spikes Noted: No Respiratory Status: Room air;Nasal cannula History of Recent Intubation: No Behavior/Cognition: Alert;Cooperative;Pleasant mood Oral Cavity Assessment: Within Functional Limits Oral Care Completed by SLP: Recent completion by staff Oral Cavity - Dentition: Adequate natural dentition Vision: Functional for self-feeding Self-Feeding Abilities: Able to feed self Patient Positioning: Upright in bed Baseline Vocal Quality: Normal Volitional Cough: Strong Volitional Swallow: Able to elicit     Oral/Motor/Sensory Function Overall Oral Motor/Sensory Function: Within functional limits   Ice Chips Ice chips: Within functional limits   Thin Liquid Thin Liquid: Within functional limits    Nectar Thick Nectar Thick Liquid: Not tested   Honey Thick Honey Thick Liquid: Not tested   Puree Puree: Within functional limits   Solid     Solid: Within functional limits     Keirah Konitzer H. Roddie Mc, CCC-SLP Speech Language Pathologist  Wende Bushy 09/30/2022,2:10 PM

## 2022-09-30 NOTE — NC FL2 (Signed)
[place] LEVEL OF CARE FORM     IDENTIFICATION  Patient Name: Benjamin Mason Birthdate: March 07, 1931 Sex: male Admission Date (Current Location): 09/27/2022  [facility] and [place] Number:  [employer] and Address:  [place] [address] [place]      Provider Number: [id number]  Attending Physician Name and Address:  Rodena Goldmann, DO  Relative Name and Phone Number:       Current Level of Care: Hospital Recommended Level of Care: [place] Prior Approval Number:    Date Approved/Denied:   PASRR Number: [id number]  Discharge Plan: SNF    Current Diagnoses: Patient Active Problem List   Diagnosis Date Noted   Atrial fibrillation with RVR ([place]) 09/27/2022   Acute kidney injury superimposed on chronic kidney disease ([place]) 09/27/2022   Sepsis due to urinary tract infection ([place]) 09/27/2022   Acute on chronic anemia 09/27/2022   Acute urinary retention 09/27/2022   Fall 06/03/2022   Altered mental status 06/03/2022   Hypomagnesemia 06/03/2022   Macrocytic anemia 06/03/2022   Chronic atrial fibrillation with RVR ([place]) 06/03/2022   Vascular dementia ([place]) 06/03/2022   Depression 06/03/2022   BPH (benign prostatic hyperplasia) 06/03/2022   Subdural hematoma ([place]) 06/03/2022   Subdural hemorrhage ([place]) 06/02/2022   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[place]) 04/20/2022   Pacemaker 10/13/2020   Aortic regurgitation 06/10/2020   Mitral regurgitation 06/10/2020   Closed displaced intertrochanteric fracture of right femur ([place]) 06/10/2020   H/O artificial lens replacement 06/10/2020   History of sick sinus syndrome 06/10/2020   HTN (hypertension) 06/10/2020   Sick sinus syndrome ([place]) 12/29/2019   Sinus pause 12/28/2019   Permanent atrial fibrillation ([place]) 04/03/2018   Progressive angina ([place]) 11/18/2016   CKD (chronic kidney disease), stage III ([place]) 11/18/2016    Stage 3a chronic kidney disease ([place]) 11/18/2016   GERD (gastroesophageal reflux disease) 12/04/2013   Dysphagia, unspecified(787.20) 12/04/2013   Valvular heart disease 07/31/2013   Endocarditis, valve unspecified 07/31/2013   Long term (current) use of anticoagulants 11/23/2010   Essential hypertension, benign 12/17/2009   Coronary atherosclerosis of native coronary artery 05/26/2009   Mixed hyperlipidemia 05/25/2009    Orientation RESPIRATION BLADDER Height & Weight     Self  Normal Indwelling catheter Weight: 155 lb 13.8 oz (70.7 kg) Height:  6' (182.9 cm)  BEHAVIORAL SYMPTOMS/MOOD NEUROLOGICAL BOWEL NUTRITION STATUS      Incontinent Diet (Regular. See d/c summary for updates.)  AMBULATORY STATUS COMMUNICATION OF NEEDS Skin   Extensive Assist Verbally Skin abrasions, Other (Comment) (Redness to buttocks.)                       Personal Care Assistance Level of Assistance  Bathing, Feeding, Dressing Bathing Assistance: Maximum assistance Feeding assistance: Maximum assistance Dressing Assistance: Limited assistance     Functional Limitations Info  Sight, Hearing, Speech Sight Info: Impaired Hearing Info: Impaired Speech Info: Adequate    SPECIAL CARE FACTORS FREQUENCY  PT (By licensed PT)     PT Frequency: 5x weekly              Contractures Contractures Info: Not present    Additional Factors Info  Code Status, Allergies Code Status Info: DNR Allergies Info: NKA           Current Medications (09/30/2022):  This is the current hospital active medication list Current Facility-Administered Medications  Medication Dose Route Frequency Provider Last  Rate Last Admin   acetaminophen (TYLENOL) tablet 650 mg  650 mg Oral Q6H PRN Emokpae, Ejiroghene E, MD   650 mg at 09/29/22 2119   Or   acetaminophen (TYLENOL) suppository 650 mg  650 mg Rectal Q6H PRN Emokpae, Ejiroghene E, MD       cefTRIAXone (ROCEPHIN) 2 g in sodium chloride 0.9 % 100 mL IVPB  2 g  Intravenous Q24H Emokpae, Ejiroghene E, MD 200 mL/hr at 09/30/22 0844 2 g at 09/30/22 0844   Chlorhexidine Gluconate Cloth 2 % PADS 6 each  6 each Topical Daily Emokpae, Ejiroghene E, MD   6 each at 09/29/22 0838   diltiazem (CARDIZEM) 125 mg in dextrose 5% 125 mL (1 mg/mL) infusion  5-15 mg/hr Intravenous Continuous Emokpae, Ejiroghene E, MD   Stopped at 09/28/22 0424   diltiazem (CARDIZEM) tablet 30 mg  30 mg Oral Q12H Shah, Pratik D, DO   30 mg at 09/29/22 2117   divalproex (DEPAKOTE) DR tablet 125 mg  125 mg Oral QHS Emokpae, Ejiroghene E, MD   125 mg at 09/29/22 2122   finasteride (PROSCAR) tablet 5 mg  5 mg Oral Daily Emokpae, Ejiroghene E, MD   5 mg at 09/29/22 0827   hydrOXYzine (ATARAX) tablet 10 mg  10 mg Oral BID PRN Emokpae, Ejiroghene E, MD   10 mg at 09/29/22 1611   magnesium sulfate IVPB 2 g 50 mL  2 g Intravenous Once Manuella Ghazi, Pratik D, DO       melatonin tablet 6 mg  6 mg Oral QHS PRN Emokpae, Ejiroghene E, MD   6 mg at 09/29/22 2120   metoprolol tartrate (LOPRESSOR) tablet 25 mg  25 mg Oral BID Emokpae, Ejiroghene E, MD   25 mg at 09/29/22 2118   midodrine (PROAMATINE) tablet 10 mg  10 mg Oral BID WC Emokpae, Ejiroghene E, MD   10 mg at 09/29/22 1611   Oral care mouth rinse  15 mL Mouth Rinse PRN Emokpae, Ejiroghene E, MD       pantoprazole (PROTONIX) EC tablet 40 mg  40 mg Oral Daily Emokpae, Ejiroghene E, MD   40 mg at 09/29/22 0827   polyethylene glycol (MIRALAX / GLYCOLAX) packet 17 g  17 g Oral Daily PRN Emokpae, Ejiroghene E, MD   17 g at 09/29/22 2116     Discharge Medications: Please see discharge summary for a list of discharge medications.  Relevant Imaging Results:  Relevant Lab Results:   Additional Information SSN: [id number]  Salome Arnt, LCSW

## 2022-09-30 NOTE — TOC Progression Note (Signed)
Transition of Care [facility]) - Progression Note    Patient Details  Name: Benjamin Mason MRN: [id number] Date of Birth: [birth year]  [facility] CM/SW Contact  Salome Arnt, [place] Phone Number: 09/30/2022, 8:49 AM  Clinical Narrative:  PT recommending SNF, but daughter requesting return to [place] with home health. Discussed with Nanine Means who feels pt will need SNF. Director at [employer] called daughter to explain. LCSW spoke with Maudie Mercury who requests [facility] or [facility]. Will initiate bed search and SNF auth.       Expected Discharge Plan: Assisted Living Barriers to Discharge: Continued Medical Work up  Expected Discharge Plan and Services In-house Referral: Clinical Social Work     Living arrangements for the past 2 months: [place]                                       Social Determinants of Health (SDOH) Interventions [place]: No Food Insecurity (06/03/2022)  Housing: Low Risk  (06/03/2022)  Transportation Needs: No Transportation Needs (06/03/2022)  Utilities: Not At Risk (06/03/2022)  Tobacco Use: Medium Risk (09/28/2022)    Readmission Risk Interventions    09/28/2022   10:30 AM  Readmission Risk Prevention Plan  Transportation Screening Complete  Home Care Screening Complete  Medication Review (RN CM) Complete

## 2022-10-01 DIAGNOSIS — Z043 Encounter for examination and observation following other accident: Secondary | ICD-10-CM | POA: Diagnosis not present

## 2022-10-01 DIAGNOSIS — S80812A Abrasion, left lower leg, initial encounter: Secondary | ICD-10-CM | POA: Diagnosis not present

## 2022-10-01 DIAGNOSIS — I10 Essential (primary) hypertension: Secondary | ICD-10-CM | POA: Diagnosis not present

## 2022-10-01 DIAGNOSIS — R279 Unspecified lack of coordination: Secondary | ICD-10-CM | POA: Diagnosis not present

## 2022-10-01 DIAGNOSIS — W19XXXA Unspecified fall, initial encounter: Secondary | ICD-10-CM | POA: Diagnosis not present

## 2022-10-01 DIAGNOSIS — I110 Hypertensive heart disease with heart failure: Secondary | ICD-10-CM | POA: Diagnosis not present

## 2022-10-01 DIAGNOSIS — W1839XA Other fall on same level, initial encounter: Secondary | ICD-10-CM | POA: Diagnosis not present

## 2022-10-01 DIAGNOSIS — S0590XA Unspecified injury of unspecified eye and orbit, initial encounter: Secondary | ICD-10-CM | POA: Diagnosis not present

## 2022-10-01 DIAGNOSIS — S0003XA Contusion of scalp, initial encounter: Secondary | ICD-10-CM | POA: Diagnosis not present

## 2022-10-01 DIAGNOSIS — Z7901 Long term (current) use of anticoagulants: Secondary | ICD-10-CM | POA: Diagnosis not present

## 2022-10-01 DIAGNOSIS — A419 Sepsis, unspecified organism: Secondary | ICD-10-CM | POA: Diagnosis not present

## 2022-10-01 DIAGNOSIS — F32A Depression, unspecified: Secondary | ICD-10-CM | POA: Diagnosis not present

## 2022-10-01 DIAGNOSIS — I7143 Infrarenal abdominal aortic aneurysm, without rupture: Secondary | ICD-10-CM | POA: Diagnosis not present

## 2022-10-01 DIAGNOSIS — M47812 Spondylosis without myelopathy or radiculopathy, cervical region: Secondary | ICD-10-CM | POA: Diagnosis not present

## 2022-10-01 DIAGNOSIS — Z960 Presence of urogenital implants: Secondary | ICD-10-CM | POA: Diagnosis not present

## 2022-10-01 DIAGNOSIS — F01A11 Vascular dementia, mild, with agitation: Secondary | ICD-10-CM | POA: Diagnosis not present

## 2022-10-01 DIAGNOSIS — R2689 Other abnormalities of gait and mobility: Secondary | ICD-10-CM | POA: Diagnosis not present

## 2022-10-01 DIAGNOSIS — R1310 Dysphagia, unspecified: Secondary | ICD-10-CM | POA: Diagnosis not present

## 2022-10-01 DIAGNOSIS — S0990XA Unspecified injury of head, initial encounter: Secondary | ICD-10-CM | POA: Diagnosis not present

## 2022-10-01 DIAGNOSIS — Z79899 Other long term (current) drug therapy: Secondary | ICD-10-CM | POA: Diagnosis not present

## 2022-10-01 DIAGNOSIS — N179 Acute kidney failure, unspecified: Secondary | ICD-10-CM | POA: Diagnosis not present

## 2022-10-01 DIAGNOSIS — D649 Anemia, unspecified: Secondary | ICD-10-CM | POA: Diagnosis not present

## 2022-10-01 DIAGNOSIS — I4891 Unspecified atrial fibrillation: Secondary | ICD-10-CM | POA: Diagnosis not present

## 2022-10-01 DIAGNOSIS — S0083XA Contusion of other part of head, initial encounter: Secondary | ICD-10-CM | POA: Diagnosis not present

## 2022-10-01 DIAGNOSIS — R410 Disorientation, unspecified: Secondary | ICD-10-CM | POA: Diagnosis not present

## 2022-10-01 DIAGNOSIS — R296 Repeated falls: Secondary | ICD-10-CM | POA: Diagnosis not present

## 2022-10-01 DIAGNOSIS — S42202D Unspecified fracture of upper end of left humerus, subsequent encounter for fracture with routine healing: Secondary | ICD-10-CM | POA: Diagnosis not present

## 2022-10-01 DIAGNOSIS — K8020 Calculus of gallbladder without cholecystitis without obstruction: Secondary | ICD-10-CM | POA: Diagnosis not present

## 2022-10-01 DIAGNOSIS — H401113 Primary open-angle glaucoma, right eye, severe stage: Secondary | ICD-10-CM | POA: Diagnosis not present

## 2022-10-01 DIAGNOSIS — R339 Retention of urine, unspecified: Secondary | ICD-10-CM | POA: Diagnosis not present

## 2022-10-01 DIAGNOSIS — I495 Sick sinus syndrome: Secondary | ICD-10-CM | POA: Diagnosis not present

## 2022-10-01 DIAGNOSIS — I4821 Permanent atrial fibrillation: Secondary | ICD-10-CM | POA: Diagnosis not present

## 2022-10-01 DIAGNOSIS — T83021A Displacement of indwelling urethral catheter, initial encounter: Secondary | ICD-10-CM | POA: Diagnosis not present

## 2022-10-01 DIAGNOSIS — N139 Obstructive and reflux uropathy, unspecified: Secondary | ICD-10-CM | POA: Diagnosis not present

## 2022-10-01 DIAGNOSIS — G309 Alzheimer's disease, unspecified: Secondary | ICD-10-CM | POA: Diagnosis not present

## 2022-10-01 DIAGNOSIS — K5900 Constipation, unspecified: Secondary | ICD-10-CM | POA: Diagnosis not present

## 2022-10-01 DIAGNOSIS — R531 Weakness: Secondary | ICD-10-CM | POA: Diagnosis not present

## 2022-10-01 DIAGNOSIS — F02818 Dementia in other diseases classified elsewhere, unspecified severity, with other behavioral disturbance: Secondary | ICD-10-CM | POA: Diagnosis not present

## 2022-10-01 DIAGNOSIS — I2510 Atherosclerotic heart disease of native coronary artery without angina pectoris: Secondary | ICD-10-CM | POA: Diagnosis not present

## 2022-10-01 DIAGNOSIS — N1831 Chronic kidney disease, stage 3a: Secondary | ICD-10-CM | POA: Diagnosis not present

## 2022-10-01 DIAGNOSIS — Z7401 Bed confinement status: Secondary | ICD-10-CM | POA: Diagnosis not present

## 2022-10-01 DIAGNOSIS — I4819 Other persistent atrial fibrillation: Secondary | ICD-10-CM | POA: Diagnosis not present

## 2022-10-01 DIAGNOSIS — Z87891 Personal history of nicotine dependence: Secondary | ICD-10-CM | POA: Diagnosis not present

## 2022-10-01 DIAGNOSIS — I951 Orthostatic hypotension: Secondary | ICD-10-CM | POA: Diagnosis not present

## 2022-10-01 DIAGNOSIS — Z743 Need for continuous supervision: Secondary | ICD-10-CM | POA: Diagnosis not present

## 2022-10-01 DIAGNOSIS — M6281 Muscle weakness (generalized): Secondary | ICD-10-CM | POA: Diagnosis not present

## 2022-10-01 DIAGNOSIS — N390 Urinary tract infection, site not specified: Secondary | ICD-10-CM | POA: Diagnosis not present

## 2022-10-01 DIAGNOSIS — F01518 Vascular dementia, unspecified severity, with other behavioral disturbance: Secondary | ICD-10-CM | POA: Diagnosis not present

## 2022-10-01 DIAGNOSIS — S42202S Unspecified fracture of upper end of left humerus, sequela: Secondary | ICD-10-CM | POA: Diagnosis not present

## 2022-10-01 DIAGNOSIS — R338 Other retention of urine: Secondary | ICD-10-CM | POA: Diagnosis not present

## 2022-10-01 DIAGNOSIS — R5381 Other malaise: Secondary | ICD-10-CM | POA: Diagnosis not present

## 2022-10-01 DIAGNOSIS — S01111A Laceration without foreign body of right eyelid and periocular area, initial encounter: Secondary | ICD-10-CM | POA: Diagnosis not present

## 2022-10-01 DIAGNOSIS — S0101XA Laceration without foreign body of scalp, initial encounter: Secondary | ICD-10-CM | POA: Diagnosis not present

## 2022-10-01 DIAGNOSIS — I509 Heart failure, unspecified: Secondary | ICD-10-CM | POA: Diagnosis not present

## 2022-10-01 LAB — CBC
HCT: 27 % — ABNORMAL LOW (ref 39.0–52.0)
Hemoglobin: 8.4 g/dL — ABNORMAL LOW (ref 13.0–17.0)
MCH: 32.7 pg (ref 26.0–34.0)
MCHC: 31.1 g/dL (ref 30.0–36.0)
MCV: 105.1 fL — ABNORMAL HIGH (ref 80.0–100.0)
Platelets: 195 10*3/uL (ref 150–400)
RBC: 2.57 MIL/uL — ABNORMAL LOW (ref 4.22–5.81)
RDW: 15.5 % (ref 11.5–15.5)
WBC: 8.8 10*3/uL (ref 4.0–10.5)
nRBC: 0.2 % (ref 0.0–0.2)

## 2022-10-01 LAB — BASIC METABOLIC PANEL
Anion gap: 9 (ref 5–15)
BUN: 37 mg/dL — ABNORMAL HIGH (ref 8–23)
CO2: 23 mmol/L (ref 22–32)
Calcium: 8.7 mg/dL — ABNORMAL LOW (ref 8.9–10.3)
Chloride: 107 mmol/L (ref 98–111)
Creatinine, Ser: 1.71 mg/dL — ABNORMAL HIGH (ref 0.61–1.24)
GFR, Estimated: 38 mL/min — ABNORMAL LOW (ref >60)
Glucose, Bld: 104 mg/dL — ABNORMAL HIGH (ref 70–99)
Potassium: 4 mmol/L (ref 3.5–5.1)
Sodium: 139 mmol/L (ref 135–145)

## 2022-10-01 LAB — MAGNESIUM: Magnesium: 1.8 mg/dL (ref 1.7–2.4)

## 2022-10-01 MED ORDER — MAGNESIUM SULFATE 2 GM/50ML IV SOLN
2.0000 g | Freq: Once | INTRAVENOUS | Status: AC
  Administered 2022-10-01: 2 g via INTRAVENOUS
  Filled 2022-10-01: qty 50

## 2022-10-01 MED ORDER — LORAZEPAM 1 MG PO TABS: 1.0000 mg | ORAL_TABLET | Freq: Two times a day (BID) | ORAL | 0 refills | Status: DC | PRN

## 2022-10-01 MED ORDER — HYDROXYZINE HCL 10 MG PO TABS: 10.0000 mg | ORAL_TABLET | Freq: Every morning | ORAL | 0 refills | Status: DC

## 2022-10-01 MED ORDER — DILTIAZEM HCL ER COATED BEADS 120 MG PO CP24: 120.0000 mg | ORAL_CAPSULE | Freq: Every day | ORAL | 11 refills | Status: DC

## 2022-10-01 MED ORDER — TRAZODONE HCL 100 MG PO TABS: 100.0000 mg | ORAL_TABLET | Freq: Every day | ORAL | 1 refills | Status: DC

## 2022-10-01 MED ORDER — DARBEPOETIN ALFA 100 MCG/0.5ML IJ SOSY
100.0000 ug | PREFILLED_SYRINGE | Freq: Once | INTRAMUSCULAR | Status: AC
  Administered 2022-10-01: 100 ug via SUBCUTANEOUS
  Filled 2022-10-01 (×2): qty 0.5

## 2022-10-01 MED ORDER — MIDODRINE HCL 10 MG PO TABS: 10.0000 mg | ORAL_TABLET | Freq: Three times a day (TID) | ORAL | 3 refills | Status: DC

## 2022-10-01 NOTE — Progress Notes (Signed)
Pt discharged via stretcher with [facility]. Belongings sent with pt in [facility] possession.

## 2022-10-01 NOTE — Plan of Care (Signed)
  Problem: Safety: Goal: Ability to remain free from injury will improve Outcome: Not Progressing

## 2022-10-01 NOTE — Discharge Summary (Signed)
Benjamin Mason, is a 89 y.o. male  DOB 23-Jul-1931  MRN [id number].  Admission date:  09/27/2022  Admitting Physician  Bethena Roys, MD  Discharge Date:  10/01/2022   Primary MD  Pcp, No  Recommendations for primary care physician for things to follow:   1)Avoid ibuprofen/Advil/Aleve/Motrin/Goody Powders/Naproxen/BC powders/Meloxicam/Diclofenac/Indomethacin and other Nonsteroidal anti-inflammatory medications as these will make you more likely to bleed and can cause stomach ulcers, can also cause Kidney problems.   2)Repeat CBC and BMP Blood tests on Friday 10/07/22   3) please keep Foley catheter in place for now and please follow-up with Urologist Dr. Alyson Ingles in about --- 1 to 2 weeks for recheck and follow-up evaluation  in his office----[facility], [address], [place] [address] Phone Number----[phone]  Admission Diagnosis  Atrial fibrillation with RVR [facility]) [I48.91]   Discharge Diagnosis  Atrial fibrillation with RVR ([place]) [I48.91]    Principal Problem:   Atrial fibrillation with RVR ([place]) Active Problems:   Sepsis due to urinary tract infection ([place])   Acute on chronic anemia   Acute kidney injury superimposed on chronic kidney disease ([place])   Essential hypertension, benign   HTN (hypertension)   Fall   Hypomagnesemia   Vascular dementia ([place])   Depression   Acute urinary retention      Past Medical History:  Diagnosis Date   Anxiety    Aortic regurgitation    Mild to moderate   CKD (chronic kidney disease) stage 3, GFR 30-59 ml/min (HCC)    Coronary atherosclerosis of native coronary artery    BMS to RCA and LAD 1995   Essential hypertension    Frequent PVCs    IBS (irritable bowel syndrome)    Lactose intolerance    Mitral regurgitation    Moderate   Mixed hyperlipidemia    Permanent atrial fibrillation (HCC)    Sick sinus  syndrome ([place])    St. Jude single-chamber pacemaker May 2021 - Dr. Caryl Comes   Skin cancer    Skull fracture [facility])     Past Surgical History:  Procedure Laterality Date   BALLOON DILATION  08/03/2012   Procedure: BALLOON DILATION;  Surgeon: Rogene Houston, MD;  [facility];  Service: Endoscopy;  Laterality: N/A;   COLONOSCOPY WITH ESOPHAGOGASTRODUODENOSCOPY (EGD)  08/03/2012   Procedure: COLONOSCOPY WITH ESOPHAGOGASTRODUODENOSCOPY (EGD);  Surgeon: Rogene Houston, MD;  [facility];  Service: Endoscopy;  Laterality: N/A;  215   LEFT HEART CATH AND CORONARY ANGIOGRAPHY N/A 11/18/2016   Procedure: Left Heart Cath and Coronary Angiography;  Surgeon: Peter M Martinique, MD;  Location: [place] CV LAB;  Service: Cardiovascular;  Laterality: N/A;   LEFT INGUINAL HERNIA     MALONEY DILATION  08/03/2012   Procedure: MALONEY DILATION;  Surgeon: Rogene Houston, MD;  [facility];  Service: Endoscopy;  Laterality: N/A;   PACEMAKER IMPLANT N/A 12/30/2019   Procedure: PACEMAKER IMPLANT;  Surgeon: Deboraha Sprang, MD;  Location: [place]  CV LAB;  Service: Cardiovascular;  Laterality: N/A;   PENILE PROSTHESIS IMPLANT     SAVORY DILATION  08/03/2012   Procedure: SAVORY DILATION;  Surgeon: Rogene Houston, MD;  [facility];  Service: Endoscopy;  Laterality: N/A;       HPI  from the history and physical done on the day of admission:    Chief Complaint: Fever   HPI: Benjamin Mason is a 89 y.o. male with medical history significant for vascular dementia, atrial fibrillation, SSS with pacemaker, hypertension, coronary artery disease, CKD 3A, Subural hematoma. Patient presented to the ED from [facility] memory unit reports of fever and altered mental status.  At the time of my evaluation, patient is awake, alert and oriented to person, daughter Maudie Mercury who is HCPOA is at bedside and reports that patient's mental status is at his baseline. Had fever up to  103 at the nursing home.  He was diagnosed with a UTI yesterday but has not been started on antibiotics yet.  He has a history of frequent falls, he fell a few days ago after which he has been complaining of pelvic pain.  She also reports generalized weakness and thinks this may be contributing to his more recent falls.  No difficulty breathing or chest pain noted.  No black stools no blood in stools no vomiting of blood.  He is no longer on anticoagulation due to dural hematoma 05/2022.   ED Course: Febrile to 101.7.  Tachycardic heart rates up to 170s.  Respiratory rate 16-22.  Blood pressure systolic 123456.  O2 sats greater than 94% on room air. Leukocytosis of 15.1.  Creatinine elevated 2.34.  Lactic acid 2.  Magnesium 1.2.  Stool occult negative. 1 L bolus given. IV cefepime started.  Metoprolol 5 mg given x 3 without improvement in heart rate, subsequently Cardizem drip started.    Review of Systems: As per HPI all other systems reviewed and negative.     Hospital Course:    Assessment and Plan: 1) Atrial fibrillation with RVR ([place]) Heart rates up to 170s on admission -  Currently controlled in the low 100s.  Likely driven by sepsis, in the setting of hypomagnesemia history of atrial fibrillation not on anticoagulation, Eliquis was discontinued after subdural hematoma 05/2022, Eliquis had to be reversed. --  Last echo 2020- EF 55 to 60%. -Required IV metoprolol and IV Cardizem drip to achieve rate control initially -Currently rate appears controlled on oral Cardizem and metoprolol -Okay to use midodrine to keep  blood pressure up so he can tolerate Cardizem and metoprolol for A-fib rate control  2)Acute on chronic anemia Hemoglobin 7.3, baseline 8-10.  Stool occult negative.  Denies GI blood loss.  Anemia panel suggest both iron deficiency anemia and anemia of chronic disease. -Hgb is up to 8.4 from 7.1 after transfusion of 1 unit of PRBC on 09/30/2022 -Give Aranesp 114mg subcu x 1  today -Repeat CBC on Friday, 10/07/2022  3)Sepsis due to E. coli urinary tract infection -POA -Initially treated with -IV cefepime started in ED, -Urine culture from 09/27/2022 with mostly pansensitive E. coli -Transitioned to IV ceftriaxone x 4 additional days for total of 5 days of antibiotic therapy  4)Acute kidney injury superimposed on chronic kidney disease 3B AKI on CKD stage IIIb.   Creatinine elevated on admission to 2.47,  baseline creatinine 1.5-1.9.   -Creatinine trending down currently down to 1.71  5)Acute urinary retention /BPH with LUTs ---Acute urinary retention with 1 L  of urine in bladder. -Foley catheter inserted in ED. - patient failed voiding trial -Keep Foley in -Continue finasteride -Reluctant to use Flomax due to concerns for orthostatic hypotension and recurrent falls -Follow-up as outpatient with Dr. Alyson Ingles the urologist for voiding trial in 1 to 2 weeks  6)Vascular dementia [facility]) Per daughter status is currently at baseline.  He is alert and oriented to person.  He resides at [facility] facility in the memory unit. -Patient with recurrent falls--PT recommends SNF rehab -Resume Depakote,  -May use as needed lorazepam  7)Hypomagnesemia -Replaced -Potassium WNL  8)Fall--current falls while at [facility]/memory unit [place] a few days ago, head and cervical CT negative for acute abnormality, complains of pelvic pain, but CT pelvis negative for fracture. --Sling to left shoulder from fracture of humeral neck 08/2022.,  Outpatient follow-up with orthopedics advised -Fall precautions at [facility]  9)H/o Essential hypertension, benign -Patient requires Cardizem and metoprolol mostly for rate control -Okay to use midodrine to keep  blood pressure up so he can tolerate Cardizem and metoprolol for A-fib rate control  10)Dysphagia--BSE completed 05/2022. Pt with known esophageal dysphagia ,speech therapist eval appreciated recommends Dysphagia 3 (Mech soft);Thin  liquid    Discharge Condition: stable  Follow UP---Urology   Contact information for follow-up providers     McKenzie, Candee Furbish, MD Follow up.   Specialty: Urology Contact information: [address] [place]  [address] [phone]              Contact information for after-discharge care     Destination     [facility] .   Service: Skilled Nursing Contact information: [address] [place] obtained - palliative  Diet and Activity recommendation:  As advised  Discharge Instructions    Discharge Instructions     Call MD for:  difficulty breathing, headache or visual disturbances   Complete by: As directed    Call MD for:  persistant dizziness or light-headedness   Complete by: As directed    Call MD for:  persistant nausea and vomiting   Complete by: As directed    Call MD for:  severe uncontrolled pain   Complete by: As directed    Call MD for:  temperature >100.4   Complete by: As directed    Diet general   Complete by: As directed    Discharge instructions   Complete by: As directed    1)Avoid ibuprofen/Advil/Aleve/Motrin/Goody Powders/Naproxen/BC powders/Meloxicam/Diclofenac/Indomethacin and other Nonsteroidal anti-inflammatory medications as these will make you more likely to bleed and can cause stomach ulcers, can also cause Kidney problems.   2)Repeat CBC and BMP Blood tests on Friday 10/07/22   3)Please keep Foley catheter in place for now and please follow-up with Urologist Dr. Alyson Ingles in about --- 1 to 2 weeks for recheck and follow-up evaluation  in his office----[facility], [address], [place] [address] Phone Number----[phone]   Increase activity slowly   Complete by: As directed         Discharge Medications     Allergies as of 10/01/2022   No Known Allergies       Medication List     STOP taking these medications    brimonidine 0.2 % ophthalmic solution Commonly known as: ALPHAGAN   Lagevrio 200 MG Caps capsule Generic  drug: molnupiravir EUA   loratadine 10 MG tablet Commonly known as: CLARITIN   sertraline 50 MG tablet Commonly known as: ZOLOFT       TAKE these medications    acetaminophen 650 MG CR tablet Commonly known as: TYLENOL Take 650 mg by mouth 2 (two) times daily.   B-12 1000 MCG Tabs Take 1 tablet (1,000 mcg total) by mouth daily.   diltiazem 120 MG 24 hr capsule Commonly known as: Cardizem CD Take 1 capsule (120 mg total) by mouth daily.   divalproex 125 MG DR tablet Commonly known as: Depakote Take 1 tablet (125 mg total) by mouth at bedtime.   Ensure High Protein Liqd Take 237 mLs by mouth 3 (three) times daily after meals.   finasteride 5 MG tablet Commonly known as: PROSCAR Take 5 mg by mouth daily.   hydrOXYzine 10 MG tablet Commonly known as: ATARAX Take 1 tablet (10 mg total) by mouth in the morning. What changed:  when to take this reasons to take this   LORazepam 1 MG tablet Commonly known as: Ativan Take 1 tablet (1 mg total) by mouth every 12 (twelve) hours as needed for anxiety.   metoprolol tartrate 25 MG tablet Commonly known as: LOPRESSOR Take 1 tablet (25 mg total) by mouth 2 (two) times daily.   midodrine 10 MG tablet Commonly known as: PROAMATINE Take 1 tablet (10 mg total) by mouth 3 (three) times daily with meals. What changed:  medication strength how much to take how to take this when to take this additional instructions   nitroGLYCERIN 0.4 MG SL tablet Commonly known as: NITROSTAT DISSOLVE ONE TABLET UNDER TONGUE EVERY 5 MINUTES UP TO 3 DOSES AS NEEDED FOR CHEST PAIN   pantoprazole 40 MG tablet Commonly known as: PROTONIX Take 40 mg by mouth daily.   Systane Balance 0.6 % Soln Generic drug: Propylene Glycol Place 1 drop into both eyes in the morning and at  bedtime.   Vitamin D3 25 MCG (1000 UT) capsule Take 1,000 Units by mouth daily.        Major procedures and Radiology Reports - PLEASE review detailed and final reports for all details, in brief -   CT Head Wo Contrast  Result Date: 09/27/2022 CLINICAL DATA:  Provided history: Head trauma, minor. EXAM: CT HEAD WITHOUT CONTRAST TECHNIQUE: Contiguous axial images were obtained from the base of the skull through the vertex without intravenous contrast. RADIATION DOSE REDUCTION: This exam was performed according to the departmental dose-optimization program which includes automated exposure control, adjustment of the mA and/or kV according to patient size and/or use of iterative reconstruction technique. COMPARISON:  Prior head CT examinations 06/04/2022 and earlier. FINDINGS: Brain: Moderate cerebral atrophy. Small foci of chronic encephalomalacia/gliosis within the inferior frontal lobes bilaterally, at sites of prior parenchymal contusions. Moderate patchy and ill-defined hypoattenuation within the cerebral white matter, nonspecific but compatible with chronic small ischemic disease. There is no acute intracranial hemorrhage. No demarcated cortical infarct. No extra-axial fluid collection. No evidence of an intracranial mass. No midline shift. Vascular: No hyperdense vessel.  Atherosclerotic calcifications. Skull: Known non-acute, nondisplaced fracture of the parietooccipital calvarium. Sinuses/Orbits: No mass or acute finding within the imaged orbits. Small volume frothy secretions within a posterior left ethmoid air cell. IMPRESSION: 1.  No evidence of an acute intracranial abnormality. 2. Small foci of chronic encephalomalacia/gliosis within the inferior temporal lobes, bilaterally (at sites of prior parenchymal contusions). 3. Moderate chronic small vessel ischemic changes within the cerebral white matter.  4. Moderate cerebral atrophy. 5. Known non-acute, nondisplaced fracture of the parietooccipital  calvarium. 6. Mild left ethmoid sinusitis. Electronically Signed   By: Kellie Simmering D.O.   On: 09/27/2022 14:20   CT PELVIS WO CONTRAST  Result Date: 09/27/2022 CLINICAL DATA:  Status post fall, hip pain EXAM: CT PELVIS WITHOUT CONTRAST TECHNIQUE: Multidetector CT imaging of the pelvis was performed following the standard protocol without intravenous contrast. RADIATION DOSE REDUCTION: This exam was performed according to the departmental dose-optimization program which includes automated exposure control, adjustment of the mA and/or kV according to patient size and/or use of iterative reconstruction technique. COMPARISON:  Pelvic radiograph dated 03/01/2022 FINDINGS: Urinary Tract: Bladder is decompressed by an indwelling Foley catheter. Bowel:  Sigmoid diverticulosis, without evidence of diverticulitis. Vascular/Lymphatic: No evidence of aneurysm. Atherosclerotic calcifications the abdominal aorta and branch vessels. No suspicious pelvic lymphadenopathy. Reproductive: Prostate is unremarkable. Penile prosthesis with reservoir in the right anterior lower pelvis (series 7/image 80). Other:  No pelvic ascites. Musculoskeletal: Status post ORIF of the right hip, without evidence of complication. Visualized bony pelvis appears intact. Moderate degenerative changes of the left hip. Degenerative changes of the lower lumbar spine, most prominent at L4-5. IMPRESSION: Status post ORIF of the right hip, without evidence of complication. No fracture or dislocation is seen. Additional ancillary findings as above. Aortic Atherosclerosis (ICD10-I70.0). Electronically Signed   By: Julian Hy M.D.   On: 09/27/2022 14:20   CT Cervical Spine Wo Contrast  Result Date: 09/27/2022 CLINICAL DATA:  Neck pain after falling. Recent urinary tract infection diagnosis. EXAM: CT CERVICAL SPINE WITHOUT CONTRAST TECHNIQUE: Multidetector CT imaging of the cervical spine was performed without intravenous contrast. Multiplanar CT  image reconstructions were also generated. RADIATION DOSE REDUCTION: This exam was performed according to the departmental dose-optimization program which includes automated exposure control, adjustment of the mA and/or kV according to patient size and/or use of iterative reconstruction technique. COMPARISON:  Cervical spine CT 06/02/2022. FINDINGS: Despite efforts by the technologist and patient, mild motion artifact is present on today's exam and could not be eliminated. This reduces exam sensitivity and specificity. Alignment: Straightening without focal angulation or significant listhesis. Skull base and vertebrae: No evidence of acute fracture or traumatic subluxation. There is multilevel cervical spondylosis with disc space narrowing, uncinate spurring and facet hypertrophy. Ossification of the ligamentum nuchae noted. Soft tissues and spinal canal: No prevertebral fluid or swelling. No visible canal hematoma. Disc levels: Multilevel spondylosis with disc space narrowing, uncinate spurring and facet hypertrophy, similar to previous study. Resulting chronic mild to moderate foraminal narrowing at multiple levels. No large disc herniation identified. Upper chest: Emphysematous changes at the lung apices. Other: Bilateral carotid atherosclerosis. IMPRESSION: 1. No evidence of acute cervical spine fracture, traumatic subluxation or static signs of instability. 2. Multilevel cervical spondylosis, similar to previous study. Electronically Signed   By: Richardean Sale M.D.   On: 09/27/2022 14:15   DG Chest Port 1 View  Result Date: 09/27/2022 CLINICAL DATA:  Possible sepsis. EXAM: PORTABLE CHEST 1 VIEW COMPARISON:  Chest radiograph 03/01/2022. FINDINGS: The left chest wall cardiac device is stable. The cardiomediastinal silhouette is stable. There is no focal consolidation or pulmonary edema. There is no pleural effusion or pneumothorax There is possible prior fracture of the left clavicle. There is a left  proximal humeral fracture which was acute on the radiographs from 08/26/2022. There is no new acute osseous abnormality. IMPRESSION: No radiographic evidence of acute cardiopulmonary process. Electronically Signed   By: Collier Salina  Noone M.D.   On: 09/27/2022 13:26   DG Shoulder Left  Result Date: 09/07/2022 Shoulder obtained in clinic today.  There is a fracture through the surgical neck area of the humerus, with proximal displacement of the shaft fragment.  There appears to be some impaction.  Glenohumeral joint is reduced.  No bony lesions.  Impression: Left proximal humerus fracture, with impaction and minimal displacement.    Micro Results   Recent Results (from the past 240 hour(s))  Blood Culture (routine x 2)     Status: None (Preliminary result)   Collection Time: 09/27/22 12:05 PM   Specimen: BLOOD  Result Value [place] Status   Specimen Description BLOOD RIGHT ASSIST CONTROL  Final   Special Requests   Final    BOTTLES DRAWN AEROBIC AND ANAEROBIC Blood Culture adequate volume   Culture   Final    NO GROWTH 4 DAYS Performed at [facility], [address] [place], [place] [address]    Report Status PENDING  Incomplete  Urine Culture (for pregnant, neutropenic or urologic patients or patients with an indwelling urinary catheter)     Status: Abnormal   Collection Time: 09/27/22 12:45 PM   Specimen: Urine, Clean Catch  Result Value Ref [place] Status   Specimen Description   Final    URINE, CLEAN CATCH Performed at [facility], [address], [place], [place] [address]    Special Requests   Final    NONE Performed at [facility], [address], [place], [place] [address]    Culture >=100,000 COLONIES/mL ESCHERICHIA COLI (A)  Final   Report Status 09/29/2022 FINAL  Final   Organism ID, Bacteria ESCHERICHIA COLI (A)  Final      Susceptibility   Escherichia coli - MIC*    AMPICILLIN <=2 SENSITIVE Sensitive     CEFAZOLIN <=4 SENSITIVE Sensitive     CEFEPIME <=0.12 SENSITIVE  Sensitive     CEFTRIAXONE <=0.25 SENSITIVE Sensitive     CIPROFLOXACIN >=4 RESISTANT Resistant     GENTAMICIN <=1 SENSITIVE Sensitive     IMIPENEM <=0.25 SENSITIVE Sensitive     NITROFURANTOIN <=16 SENSITIVE Sensitive     TRIMETH/SULFA >=320 RESISTANT Resistant     AMPICILLIN/SULBACTAM <=2 SENSITIVE Sensitive     PIP/TAZO <=4 SENSITIVE Sensitive     * >=100,000 COLONIES/mL ESCHERICHIA COLI  Blood Culture (routine x 2)     Status: None (Preliminary result)   Collection Time: 09/27/22 12:55 PM   Specimen: BLOOD  Result Value Ref Range Status   Specimen Description BLOOD BLOOD LEFT ARM  Final   Special Requests   Final    Blood Culture adequate volume BOTTLES DRAWN AEROBIC AND ANAEROBIC   Culture   Final    NO GROWTH 4 DAYS Performed at [facility], [address], [place], [place] [address]    Report Status PENDING  Incomplete  Resp panel by RT-PCR (RSV, Flu A&B, Covid) Urine, Catheterized     Status: None   Collection Time: 09/27/22 12:59 PM   Specimen: Urine, Catheterized; Nasal Swab  Result Value Ref Range Status   SARS Coronavirus 2 by RT PCR NEGATIVE NEGATIVE Final    Comment: (NOTE) SARS-CoV-2 target nucleic acids are NOT DETECTED.  The SARS-CoV-2 RNA is generally detectable in upper respiratory specimens during the acute phase of infection. The lowest concentration of SARS-CoV-2 viral copies this assay can detect is 138 copies/mL. A negative result does not preclude SARS-Cov-2 infection and should not be used as the sole basis  for treatment or other patient management decisions. A negative result may occur with  improper specimen collection/handling, submission of specimen other than nasopharyngeal swab, presence of viral mutation(s) within the areas targeted by this assay, and inadequate number of viral copies(<138 copies/mL). A negative result must be combined with clinical observations, patient history, and epidemiological information. The expected result is  Negative.  Fact Sheet for Patients:  EntrepreneurPulse.com.au  Fact Sheet for Healthcare Providers:  IncredibleEmployment.be  This test is no t yet approved or cleared by the [place] FDA and  has been authorized for detection and/or diagnosis of SARS-CoV-2 by FDA under an Emergency Use Authorization (EUA). This EUA will remain  in effect (meaning this test can be used) for the duration of the COVID-19 declaration under Section 564(b)(1) of the Act, 21 U.S.C.section 360bbb-3(b)(1), unless the authorization is terminated  or revoked sooner.       Influenza A by PCR NEGATIVE NEGATIVE Final   Influenza B by PCR NEGATIVE NEGATIVE Final    Comment: (NOTE) The Xpert Xpress SARS-CoV-2/FLU/RSV plus assay is intended as an aid in the diagnosis of influenza from Nasopharyngeal swab specimens and should not be used as a sole basis for treatment. Nasal washings and aspirates are unacceptable for Xpert Xpress SARS-CoV-2/FLU/RSV testing.  Fact Sheet for Patients: EntrepreneurPulse.com.au  Fact Sheet for Healthcare Providers: IncredibleEmployment.be  This test is not yet approved or cleared by the [place] FDA and has been authorized for detection and/or diagnosis of SARS-CoV-2 by FDA under an Emergency Use Authorization (EUA). This EUA will remain in effect (meaning this test can be used) for the duration of the COVID-19 declaration under Section 564(b)(1) of the Act, 21 U.S.C. section 360bbb-3(b)(1), unless the authorization is terminated or revoked.     Resp Syncytial Virus by PCR NEGATIVE NEGATIVE Final    Comment: (NOTE) Fact Sheet for Patients: EntrepreneurPulse.com.au  Fact Sheet for Healthcare Providers: IncredibleEmployment.be  This test is not yet approved or cleared by the [place] FDA and has been authorized for detection and/or diagnosis of  SARS-CoV-2 by FDA under an Emergency Use Authorization (EUA). This EUA will remain in effect (meaning this test can be used) for the duration of the COVID-19 declaration under Section 564(b)(1) of the Act, 21 U.S.C. section 360bbb-3(b)(1), unless the authorization is terminated or revoked.  Performed at [facility], [address], [place], [place] [address]   MRSA Next Gen by PCR, Nasal     Status: None   Collection Time: 09/27/22  5:05 PM   Specimen: Nasal Mucosa; Nasal Swab  Result Value Ref Range Status   MRSA by PCR Next Gen NOT DETECTED NOT DETECTED Final    Comment: (NOTE) The GeneXpert MRSA Assay (FDA approved for NASAL specimens only), is one component of a comprehensive MRSA colonization surveillance program. It is not intended to diagnose MRSA infection nor to guide or monitor treatment for MRSA infections. Test performance is not FDA approved in patients less than 15 years old. Performed at [facility], [address], [place], [place] [address]   Urine Culture     Status: Abnormal   Collection Time: 09/27/22  6:03 PM   Specimen: Urine, Clean Catch  Result Value Ref Range Status   Specimen Description   Final    URINE, CLEAN CATCH Performed at [facility], [place] [address], [place], [place] [address]    Special Requests   Final    NONE Reflexed from [id number] Performed at [facility], [place]  [address] [place], [place] [address]    Culture >=100,000 COLONIES/mL ESCHERICHIA COLI (A)  Final   Report Status 09/30/2022 FINAL  Final   Organism ID, Bacteria ESCHERICHIA COLI (A)  Final      Susceptibility   Escherichia coli - MIC*    AMPICILLIN <=2 SENSITIVE Sensitive     CEFAZOLIN <=4 SENSITIVE Sensitive     CEFEPIME <=0.12 SENSITIVE Sensitive     CEFTRIAXONE <=0.25 SENSITIVE Sensitive     CIPROFLOXACIN >=4 RESISTANT Resistant     GENTAMICIN <=1 SENSITIVE Sensitive     IMIPENEM <=0.25 SENSITIVE Sensitive     NITROFURANTOIN <=16 SENSITIVE Sensitive      TRIMETH/SULFA >=320 RESISTANT Resistant     AMPICILLIN/SULBACTAM <=2 SENSITIVE Sensitive     PIP/TAZO <=4 SENSITIVE Sensitive     * >=100,000 COLONIES/mL ESCHERICHIA COLI   Today   Subjective    Benjamin Mason today has no new complaints  -Patient's daughter Maudie Mercury and son-in-law at bedside -Questions answered -Good urine output through Foley  Patient has been seen and examined prior to discharge   Objective   Blood pressure (!) 140/85, pulse 61, temperature 98.3 F (36.8 C), resp. rate 20, height 6' (1.829 m), weight 70.7 kg, SpO2 94 %.   Intake/Output Summary (Last 24 hours) at 10/01/2022 1057 Last data filed at 10/01/2022 0500 Gross per 24 hour  Intake 795 ml  Output 650 ml  Net 145 ml   Exam Gen:- Awake Alert, no acute distress  HEENT:- [place].AT, No sclera icterus Neck-Supple Neck,No JVD,.  Lungs-  CTAB , good air movement bilaterally CV- S1, S2 normal, irregular Abd-  +ve B.Sounds, Abd Soft, No tenderness,    Extremity/Skin:- No  edema,   good pulses Psych-pleasantly confused, disoriented, underlying/baseline cognitive and memory deficits noted neuro-generalized weakness, no new focal deficits, no tremors  GU-penile implant in situ, Foley in situ without hematuria   Data Review   CBC w Diff:  Lab Results  Component Value Date   WBC 8.8 10/01/2022   HGB 8.4 (L) 10/01/2022   HGB 10.7 (L) 04/07/2020   HCT 27.0 (L) 10/01/2022   HCT 32.0 (L) 04/07/2020   PLT 195 10/01/2022   PLT 146 (L) 04/07/2020   LYMPHOPCT 3 09/27/2022   MONOPCT 3 09/27/2022   EOSPCT 0 09/27/2022   BASOPCT 0 09/27/2022    CMP:  Lab Results  Component Value Date   NA 139 10/01/2022   K 4.0 10/01/2022   CL 107 10/01/2022   CO2 23 10/01/2022   BUN 37 (H) 10/01/2022   CREATININE 1.71 (H) 10/01/2022   PROT 5.7 (L) 09/27/2022   ALBUMIN 2.2 (L) 09/27/2022   BILITOT 0.7 09/27/2022   ALKPHOS 58 09/27/2022   AST 26 09/27/2022   ALT 16 09/27/2022  .  Total Discharge time is about 33  minutes  Roxan Hockey M.D on 10/01/2022 at 10:57 AM  Go to [email or web address] -  for contact info  [facility] - Office  [phone]

## 2022-10-01 NOTE — Progress Notes (Addendum)
Awaiting discharge summary completion, MD and RN notified.  Spoke with Destiny at SNF and she needs the DC summary by 11am to provide to nursing staff in order to take patient.  TOC following   - 11:11am Discharge summary has been completed and emailed to Destiny with [facility]. Reports can be called [phone], room 136-1. MD and RN notified.  -11:19am [facility] called for transportation to [facility].   No additional TOC needs at this time.

## 2022-10-01 NOTE — Progress Notes (Signed)
Patient attempted to get out of bed many times through the night, Patient placed back in bed, call light placed within reach, bed in lowest position, fall mats on both sides of bed, and bed alarm is on.

## 2022-10-01 NOTE — Discharge Instructions (Addendum)
1)Avoid ibuprofen/Advil/Aleve/Motrin/Goody Powders/Naproxen/BC powders/Meloxicam/Diclofenac/Indomethacin and other Nonsteroidal anti-inflammatory medications as these will make you more likely to bleed and can cause stomach ulcers, can also cause Kidney problems.   2)Repeat CBC and BMP Blood tests on Friday 10/07/22   3) please keep Foley catheter in place for now and please follow-up with Urologist Dr. Alyson Ingles in about --- 1 to 2 weeks for recheck and follow-up evaluation  in his office----[facility], [address], [place] [address], [place] [address] Phone Number----[phone]

## 2022-10-01 NOTE — Plan of Care (Signed)

## 2022-10-02 DIAGNOSIS — D649 Anemia, unspecified: Secondary | ICD-10-CM | POA: Diagnosis not present

## 2022-10-02 DIAGNOSIS — R1310 Dysphagia, unspecified: Secondary | ICD-10-CM | POA: Diagnosis not present

## 2022-10-02 DIAGNOSIS — I10 Essential (primary) hypertension: Secondary | ICD-10-CM | POA: Diagnosis not present

## 2022-10-02 DIAGNOSIS — F01A11 Vascular dementia, mild, with agitation: Secondary | ICD-10-CM | POA: Diagnosis not present

## 2022-10-02 DIAGNOSIS — R531 Weakness: Secondary | ICD-10-CM | POA: Diagnosis not present

## 2022-10-02 LAB — BPAM RBC
Blood Product Expiration Date: 202403012359
ISSUE DATE / TIME: 202402161106
Unit Type and Rh: 9500

## 2022-10-02 LAB — TYPE AND SCREEN
ABO/RH(D): O NEG
Antibody Screen: NEGATIVE
Unit division: 0

## 2022-10-02 LAB — CULTURE, BLOOD (ROUTINE X 2)
Culture: NO GROWTH
Culture: NO GROWTH
Special Requests: ADEQUATE
Special Requests: ADEQUATE

## 2022-10-07 ENCOUNTER — Ambulatory Visit (INDEPENDENT_AMBULATORY_CARE_PROVIDER_SITE_OTHER): Payer: Medicare Other

## 2022-10-07 ENCOUNTER — Encounter: Payer: Self-pay | Admitting: Orthopedic Surgery

## 2022-10-07 ENCOUNTER — Ambulatory Visit (INDEPENDENT_AMBULATORY_CARE_PROVIDER_SITE_OTHER): Payer: Medicare Other | Admitting: Orthopedic Surgery

## 2022-10-07 VITALS — Ht 72.0 in | Wt 155.0 lb

## 2022-10-07 DIAGNOSIS — S42202S Unspecified fracture of upper end of left humerus, sequela: Secondary | ICD-10-CM

## 2022-10-07 DIAGNOSIS — S42202D Unspecified fracture of upper end of left humerus, subsequent encounter for fracture with routine healing: Secondary | ICD-10-CM

## 2022-10-07 NOTE — Patient Instructions (Signed)
No sling  Can initiate passive range of motion, progressive increase to active motion  Can attempt to use platform walker in a few weeks

## 2022-10-07 NOTE — Progress Notes (Signed)
Return patient Visit  Assessment: Benjamin Mason is a 89 y.o. male with the following: 1. Closed fracture of proximal end of left humerus  Plan: VINICIUS GERKE has advanced dementia.  He sustained a left proximal humerus fracture approximately 6 weeks ago.  He was recently hospitalized for UTI.  He is now at a rehab facility in [place].  Radiographs today are stable.  He can discontinue use of the sling.  They can initiate passive range of motion of the left shoulder.  Okay to try platform walker within a couple of weeks.  Progressive increase in activities.  I will see him back in 6 weeks.  If he is doing well, this appointment can be canceled.   Follow-up: Return in about 6 weeks (around 11/18/2022).  Subjective:  Chief Complaint  Patient presents with   Fracture    Lt shoulder DOI 08/25/22    History of Present Illness: Benjamin Mason is a 89 y.o. male who returns for evaluation of left shoulder pain.  He sustained a fall, approximately 6 weeks ago.  He sustained a left proximal humerus fracture.  He has recently been admitted for a UTI.  Since admission, he has been transferred to a rehab facility in [place].  Regarding his left shoulder, his pain is improved.  He has remained in a sling.  He has remained nonweightbearing.  Review of Systems: Unable to assess.   Objective: Ht 6' (1.829 m)   Wt 155 lb (70.3 kg)   BMI 21.02 kg/m   Physical Exam:  General: Elderly male., No acute distress., Seated in a wheelchair., and Demented, does not answer questions appropriately.  Gait: Unable to ambulate.  Left shoulder without swelling.  No bruising.  No redness.  Tolerates forward flexion to 100 degrees.  Abduction to 80 degrees.  Fingers are warm and well-perfused.  IMAGING: I personally ordered and reviewed the following images  X-rays of the left shoulder were obtained in clinic today.  These are compared to prior x-rays.  There has been no interval displacement.  There is  an impacted fracture of the neck of the proximal humerus.  No interval displacement.  No obvious callus formation.  No new injuries are noted.  No bony lesions.  Impression: Healing left proximal humerus fracture.   New Medications:  No orders of the defined types were placed in this encounter.     Mordecai Rasmussen, MD  10/07/2022 10:19 AM

## 2022-10-12 ENCOUNTER — Ambulatory Visit: Payer: Medicare Other

## 2022-10-12 DIAGNOSIS — R338 Other retention of urine: Secondary | ICD-10-CM | POA: Diagnosis not present

## 2022-10-12 DIAGNOSIS — I495 Sick sinus syndrome: Secondary | ICD-10-CM

## 2022-10-13 ENCOUNTER — Encounter: Payer: Self-pay | Admitting: Radiology

## 2022-10-13 LAB — CUP PACEART REMOTE DEVICE CHECK
Battery Remaining Longevity: 108 mo
Battery Remaining Percentage: 82 %
Battery Voltage: 3.02 V
Brady Statistic RV Percent Paced: 1 %
Date Time Interrogation Session: 20240228172859
Implantable Lead Connection Status: 753985
Implantable Lead Implant Date: 20210517
Implantable Lead Location: 753860
Implantable Pulse Generator Implant Date: 20210517
Lead Channel Impedance Value: 510 Ohm
Lead Channel Pacing Threshold Amplitude: 0.5 V
Lead Channel Pacing Threshold Pulse Width: 0.5 ms
Lead Channel Sensing Intrinsic Amplitude: 5.6 mV
Lead Channel Setting Pacing Amplitude: 2.5 V
Lead Channel Setting Pacing Pulse Width: 0.5 ms
Lead Channel Setting Sensing Sensitivity: 0.7 mV
Pulse Gen Model: 1272
Pulse Gen Serial Number: 3800831

## 2022-10-18 ENCOUNTER — Ambulatory Visit: Payer: Medicare Other | Admitting: Physician Assistant

## 2022-10-18 ENCOUNTER — Encounter: Payer: Self-pay | Admitting: Physician Assistant

## 2022-10-18 VITALS — BP 118/84 | HR 87 | Resp 18 | Ht 73.0 in

## 2022-10-18 DIAGNOSIS — F02818 Dementia in other diseases classified elsewhere, unspecified severity, with other behavioral disturbance: Secondary | ICD-10-CM | POA: Diagnosis not present

## 2022-10-18 DIAGNOSIS — G309 Alzheimer's disease, unspecified: Secondary | ICD-10-CM

## 2022-10-18 DIAGNOSIS — F01518 Vascular dementia, unspecified severity, with other behavioral disturbance: Secondary | ICD-10-CM | POA: Diagnosis not present

## 2022-10-18 NOTE — Progress Notes (Signed)
Assessment/Plan:   Dementia likely due to Alzheimer's Disease and vascular etiology   Benjamin Mason is a very pleasant 89 y.o. 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  He is on Depakote 125 mg at night for sundowning.  He is no longer on antidementia medication due to risks outweighing the benefits given advanced age and memory decline. His overall status has worsened, unable to follow commands, less interactive and verbal than prior. He is O2 prn (his Osats today were 89 percent) . He had episodes or urinary retention requiring urinary cath, unable to remove. He is on Rehab facility [facility]) as he needs 24/7 since he is at risk for falls, and requires more care and Memory facility can offer. Discussed with the family, and entertained all their questions.       Follow up in  6 months. Continue Depakote 125 mg nightly for sundowning,  side effects discussed. No indication for antidementia medication giver his cognitive decline     Subjective:    This patient is accompanied in the office by his daughter and her husband who supplements the history.  Previous records as well as any outside records available were reviewed prior to todays visit. Patient was last seen on 07/26/22 , MMSE and MOCA unable to be performed due to advanced memory decline    Any changes in memory since last visit? Unaware of the surroundings, not following commands . "He repeats what I am telling him in a different way ".  repeats oneself?  Endorsed.  Not as verbal, "he does not make any sense"  Disoriented when walking into a room?   Endorsed  Leaving objects in unusual places?    denies   Wandering behavior?   denies   Any personality changes since last visit? "He is his pleasant self" Any worsening depression?:  denies    Hallucinations or paranoia? " He is grabbing stuff from the floor  that is not there " Seizures?    denies    Any sleep changes?  Sleeps well . Denies vivid dreams, REM behavior or sleepwalking   Sleep apnea?   denies   Any hygiene concerns? Needs assistance from the staff  Independent of bathing and dressing?  Endorsed needs assistance  Does the patient needs help with medications?Staff is in charge   Who is in charge of the finances?  Daughters are in charge     Any changes in appetite?" He holds water in the mouth, but he eats ok, appetite not good ".  Patient have trouble swallowing?  denies  problems with solids  Any headaches?   denies   Chronic back pain  denies   Ambulates with difficulty?  Wheelchair bound, doing rehab, fall risk  Unilateral weakness, numbness or tingling?    denies   Any tremors?  denies   Any anosmia?  Patient denies   Any incontinence of urine? He has urine retention, needing a catheter  Any bowel dysfunction?  Denies  Patient lives  He was at the [place] at [facility] now he is at [facility]  rehab  Does the patient drive?no longer drives     Initial visit 04/19/22 How long did patient have memory  difficulties? 4-5 years.  Initially, she was forgetting dates, people's names that he knew for a long time, when he began losing objects such as keys and watches.  "The last drop was after his fall in July 2023 "-daughter says.  He also has significant difficulty using tools such as the remote control.  He no longer does crossword puzzles because he gets frustrated.  He was patient lives: Adult living facility [facility]) repeats oneself?  Endorsed.  Speech has become tangential Disoriented when walking into a room?  Sometimes. Leaving objects in unusual places?  Patient denies   Ambulates  with difficulty?   Patient denies   Recent falls and head injury?   Endorsed, he had a presentation to the ED in July 2023 after sustaining a fall while wandering in the woods "looking for birds ".  He received PT.  No LOC, negative work-up at the ED, sent to [place] home (03/01/2022) History of seizures?   Patient denies   Wandering behavior?  As above, he wandered into with prior to the fall.   Patient drives?   Patient no longer drives since getting lost when he was going to eat at a fast food restaurant.  He denies no longer driving, he reports that "my favorite is the highway "(the keys have been taken away) Any mood changes such irritability agitation?  "He has lost his patience about 1 year ago " Any history of depression?:  "Cried all the time until taking Zoloft for the last year ".  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  Sees birds in the trees.  Paranoia?  Hides his wallet and checkbook under the pillow "so it is safe " Patient reports that sleeps well without vivid dreams, REM behavior or sleepwalking    History of sleep apnea?  Patient denies   Any hygiene concerns? Endorsed, has to be reminded  Independent of bathing and dressing?  Endorsed  Does the patient needs help with medications?  Facility provides the medications Who is in charge of the finances?  Family is in charge. Any changes in appetite?  Patient denies   Patient have trouble swallowing?  Patient has a history of esophageal stricture, and has had evaluation by GI, but there are no difficulties with swallowing. Does the patient cook?  Patient denies   Any kitchen accidents such as leaving the stove on? Patient denies   Any headaches?  Patient denies   Double vision? Patient denies   Any focal numbness or tingling?  Patient denies   Chronic back pain Patient denies   Unilateral weakness?  Patient denies   Any tremors?  Patient denies   Any history of anosmia?  Patient denies   Any incontinence of urine?  Endorsed uses  Depends  Any bowel dysfunction?   Patient denies   History of heavy alcohol intake?  Patient denies   History of heavy tobacco use?  Patient denies   Family history of dementia?  Mother had Alzheimer's disease     Labs 03/01/2022 H&H was 9.4-28.7 respectively, MCV 107.1 (high)   CT of the head without contrast 03/01/2022 (no film is available for review) showed moderate age-related atrophy and chronic microvascular ischemic changes, no acute intracranial hemorrhage, no fracture. PREVIOUS MEDICATIONS:   CURRENT MEDICATIONS:  Outpatient Encounter Medications as of 10/18/2022  Medication Sig   acetaminophen (TYLENOL) 650 MG CR tablet Take 650 mg by mouth 2 (two) times daily.   Cholecalciferol (VITAMIN  D3) 25 MCG (1000 UT) CAPS Take 1,000 Units by mouth daily.   Cyanocobalamin (B-12) 1000 MCG TABS Take 1 tablet (1,000 mcg total) by mouth daily.   diltiazem (CARDIZEM CD) 120 MG 24 hr capsule Take 1 capsule (120 mg total) by mouth daily.   divalproex (DEPAKOTE) 125 MG DR tablet Take 1 tablet (125 mg total) by mouth at bedtime.   finasteride (PROSCAR) 5 MG tablet Take 5 mg by mouth daily.     hydrOXYzine (ATARAX) 10 MG tablet Take 1 tablet (10 mg total) by mouth in the morning.   LORazepam (ATIVAN) 1 MG tablet Take 1 tablet (1 mg total) by mouth every 12 (twelve) hours as needed for anxiety.   metoprolol tartrate (LOPRESSOR) 25 MG tablet Take 1 tablet (25 mg total) by mouth 2 (two) times daily.   midodrine (PROAMATINE) 10 MG tablet Take 1 tablet (10 mg total) by mouth 3 (three) times daily with meals.   nitroGLYCERIN (NITROSTAT) 0.4 MG SL tablet DISSOLVE ONE TABLET UNDER TONGUE EVERY 5 MINUTES UP TO 3 DOSES AS NEEDED FOR CHEST PAIN   Nutritional Supplements (ENSURE HIGH PROTEIN) LIQD Take 237 mLs by mouth 3 (three) times daily after meals.   pantoprazole (PROTONIX) 40 MG tablet Take 40 mg by mouth daily.    Propylene Glycol (SYSTANE BALANCE) 0.6 % SOLN Place 1 drop into both eyes in the morning and  at bedtime.   No facility-administered encounter medications on file as of 10/18/2022.        No data to display             No data to display          Objective:     PHYSICAL EXAMINATION:    VITALS:   Vitals:   10/18/22 1430  BP: 118/84  Pulse: 87  Resp: 18  SpO2: (!) 89%  Height: '6\' 1"'$  (1.854 m)    GEN:  The patient appears stated age and is in NAD. HEENT:  Normocephalic, atraumatic.   Neurological examination:  General: NAD, well-groomed, appears stated age. Orientation: The patient is  more lethargic.Not  oriented to person, place or date Cranial nerves: There is good facial symmetry.The speech is not  fluent  or clear. Essentially non verbal No aphasia or dysarthria. Fund of knowledge is reduced.  Recent and remote memory are impaired. Attention and concentration are reduced. Unable  to name objects or  repeat phrases.  Hearing is intact to conversational tone.   Sensation: Sensation is intact to light touch throughout Motor: unable to test, he does not follow this command  DTR's 2/4 in UE/LE     Movement examination: Tone: There is decreased tone in the UE/LE Abnormal movements:  no tremor.  No myoclonus.  No asterixis.   Coordination:  There is decremation with RAM's. Unable to perform finger to nose  Gait and Station: patient wheelchair bound, unable to test gait    Thank you for allowing [place] the opportunity to participate in the care of this nice patient. Please do not hesitate to contact us for any questions or concerns.   Total time spent on today's visit was 40 minutes dedicated to this patient today, preparing to see patient, examining the patient, ordering tests and/or medications and counseling the patient, documenting clinical information in the EHR or other health record, independently interpreting results and communicating results to the patient/family, discussing treatment and goals, answering patient's questions and coordinating care.  Cc:   Pcp, No  Sharene Butters  10/18/2022 6:46 PM

## 2022-10-18 NOTE — Patient Instructions (Signed)
Follow up in 6 months  Continue Depakote nightly

## 2022-10-25 DIAGNOSIS — R5381 Other malaise: Secondary | ICD-10-CM | POA: Diagnosis not present

## 2022-10-25 DIAGNOSIS — S0101XA Laceration without foreign body of scalp, initial encounter: Secondary | ICD-10-CM | POA: Diagnosis not present

## 2022-10-25 DIAGNOSIS — S01111A Laceration without foreign body of right eyelid and periocular area, initial encounter: Secondary | ICD-10-CM | POA: Diagnosis not present

## 2022-10-25 DIAGNOSIS — Z043 Encounter for examination and observation following other accident: Secondary | ICD-10-CM | POA: Diagnosis not present

## 2022-10-25 DIAGNOSIS — S0590XA Unspecified injury of unspecified eye and orbit, initial encounter: Secondary | ICD-10-CM | POA: Diagnosis not present

## 2022-10-25 DIAGNOSIS — K8020 Calculus of gallbladder without cholecystitis without obstruction: Secondary | ICD-10-CM | POA: Diagnosis not present

## 2022-10-25 DIAGNOSIS — S0990XA Unspecified injury of head, initial encounter: Secondary | ICD-10-CM | POA: Diagnosis not present

## 2022-10-25 DIAGNOSIS — Z79899 Other long term (current) drug therapy: Secondary | ICD-10-CM | POA: Diagnosis not present

## 2022-10-25 DIAGNOSIS — I110 Hypertensive heart disease with heart failure: Secondary | ICD-10-CM | POA: Diagnosis not present

## 2022-10-25 DIAGNOSIS — I7143 Infrarenal abdominal aortic aneurysm, without rupture: Secondary | ICD-10-CM | POA: Diagnosis not present

## 2022-10-25 DIAGNOSIS — I4891 Unspecified atrial fibrillation: Secondary | ICD-10-CM | POA: Diagnosis not present

## 2022-10-25 DIAGNOSIS — Z743 Need for continuous supervision: Secondary | ICD-10-CM | POA: Diagnosis not present

## 2022-10-25 DIAGNOSIS — Z87891 Personal history of nicotine dependence: Secondary | ICD-10-CM | POA: Diagnosis not present

## 2022-10-25 DIAGNOSIS — W19XXXA Unspecified fall, initial encounter: Secondary | ICD-10-CM | POA: Diagnosis not present

## 2022-10-25 DIAGNOSIS — I509 Heart failure, unspecified: Secondary | ICD-10-CM | POA: Diagnosis not present

## 2022-10-25 DIAGNOSIS — M47812 Spondylosis without myelopathy or radiculopathy, cervical region: Secondary | ICD-10-CM | POA: Diagnosis not present

## 2022-10-25 DIAGNOSIS — K5900 Constipation, unspecified: Secondary | ICD-10-CM | POA: Diagnosis not present

## 2022-11-04 DIAGNOSIS — W19XXXA Unspecified fall, initial encounter: Secondary | ICD-10-CM | POA: Diagnosis not present

## 2022-11-04 DIAGNOSIS — Z87891 Personal history of nicotine dependence: Secondary | ICD-10-CM | POA: Diagnosis not present

## 2022-11-04 DIAGNOSIS — S0003XA Contusion of scalp, initial encounter: Secondary | ICD-10-CM | POA: Diagnosis not present

## 2022-11-04 DIAGNOSIS — T83021A Displacement of indwelling urethral catheter, initial encounter: Secondary | ICD-10-CM | POA: Diagnosis not present

## 2022-11-04 DIAGNOSIS — R410 Disorientation, unspecified: Secondary | ICD-10-CM | POA: Diagnosis not present

## 2022-11-04 DIAGNOSIS — Z7901 Long term (current) use of anticoagulants: Secondary | ICD-10-CM | POA: Diagnosis not present

## 2022-11-04 DIAGNOSIS — Z743 Need for continuous supervision: Secondary | ICD-10-CM | POA: Diagnosis not present

## 2022-11-04 DIAGNOSIS — I509 Heart failure, unspecified: Secondary | ICD-10-CM | POA: Diagnosis not present

## 2022-11-04 DIAGNOSIS — S0083XA Contusion of other part of head, initial encounter: Secondary | ICD-10-CM | POA: Diagnosis not present

## 2022-11-04 DIAGNOSIS — S80812A Abrasion, left lower leg, initial encounter: Secondary | ICD-10-CM | POA: Diagnosis not present

## 2022-11-04 DIAGNOSIS — I110 Hypertensive heart disease with heart failure: Secondary | ICD-10-CM | POA: Diagnosis not present

## 2022-11-04 DIAGNOSIS — W1839XA Other fall on same level, initial encounter: Secondary | ICD-10-CM | POA: Diagnosis not present

## 2022-11-04 DIAGNOSIS — I4891 Unspecified atrial fibrillation: Secondary | ICD-10-CM | POA: Diagnosis not present

## 2022-11-04 DIAGNOSIS — S0990XA Unspecified injury of head, initial encounter: Secondary | ICD-10-CM | POA: Diagnosis not present

## 2022-11-07 DIAGNOSIS — I10 Essential (primary) hypertension: Secondary | ICD-10-CM | POA: Diagnosis not present

## 2022-11-07 DIAGNOSIS — H401113 Primary open-angle glaucoma, right eye, severe stage: Secondary | ICD-10-CM | POA: Diagnosis not present

## 2022-11-07 DIAGNOSIS — F01A11 Vascular dementia, mild, with agitation: Secondary | ICD-10-CM | POA: Diagnosis not present

## 2022-11-07 DIAGNOSIS — I4819 Other persistent atrial fibrillation: Secondary | ICD-10-CM | POA: Diagnosis not present

## 2022-11-07 DIAGNOSIS — R1310 Dysphagia, unspecified: Secondary | ICD-10-CM | POA: Diagnosis not present

## 2022-11-08 ENCOUNTER — Ambulatory Visit: Payer: Medicare Other | Admitting: Urology

## 2022-11-08 DIAGNOSIS — R339 Retention of urine, unspecified: Secondary | ICD-10-CM

## 2022-11-08 DIAGNOSIS — N390 Urinary tract infection, site not specified: Secondary | ICD-10-CM

## 2022-11-10 ENCOUNTER — Ambulatory Visit: Payer: Medicare Other | Admitting: Urology

## 2022-11-10 ENCOUNTER — Encounter: Payer: Self-pay | Admitting: Urology

## 2022-11-10 VITALS — BP 98/55 | HR 57 | Ht 72.0 in | Wt 140.2 lb

## 2022-11-10 DIAGNOSIS — R339 Retention of urine, unspecified: Secondary | ICD-10-CM | POA: Diagnosis not present

## 2022-11-10 NOTE — Progress Notes (Signed)
Subjective: 1. Urinary retention       Benjamin Mason is an 89 yo male who has a history of urinary retention with a history of dementia, atrial fibrillation with a pacemaker, hypertension, CKD 3, multiple falls and a subdural hematoma.  He was hospitalized lon 09/27/22  with septicemia from a UTI, atrial fibrillation with RVR and an acute kidney injury.  At that time he was noted to have an E. coli UTI and retention with a 1200 mL residual.  A Foley catheter was placed.  He failed a voiding trial 3 days later.  Since then he has had a voiding trial at the nursing home without success.  He has been on finasteride.  He is not on an alpha-blocker because of hypotension.  His prior urologic history includes a vasectomy, penile prosthesis placement and subsequent revision.  He has also had hypogonadism that had been treated in the past.   There are some concerns about the foley with the penile prosthesis.   On CT that reservoir is to the right of the bladder and appears somewhat decompressed but the device is partially deflated.   He has failed 3 voiding trials and last had it changed on 11/04/22 after he pulled it out with the balloon up and fell and had to go to the ER.  ROS:  Review of Systems  Unable to perform ROS: Dementia    No Known Allergies  Past Medical History:  Diagnosis Date   Anxiety    Aortic regurgitation    Mild to moderate   CKD (chronic kidney disease) stage 3, GFR 30-59 ml/min (HCC)    Coronary atherosclerosis of native coronary artery    BMS to RCA and LAD 1995   Essential hypertension    Frequent PVCs    IBS (irritable bowel syndrome)    Lactose intolerance    Mitral regurgitation    Moderate   Mixed hyperlipidemia    Permanent atrial fibrillation (HCC)    Sick sinus syndrome ([place])    St. Jude single-chamber pacemaker May 2021 - Dr. Caryl Comes   Skin cancer    Skull fracture [facility])     Past Surgical History:  Procedure Laterality Date   BALLOON DILATION  08/03/2012    Procedure: BALLOON DILATION;  Surgeon: Rogene Houston, MD;  [facility];  Service: Endoscopy;  Laterality: N/A;   COLONOSCOPY WITH ESOPHAGOGASTRODUODENOSCOPY (EGD)  08/03/2012   Procedure: COLONOSCOPY WITH ESOPHAGOGASTRODUODENOSCOPY (EGD);  Surgeon: Rogene Houston, MD;  Location: [facility];  Service: Endoscopy;  Laterality: N/A;  215   LEFT HEART CATH AND CORONARY ANGIOGRAPHY N/A 11/18/2016   Procedure: Left Heart Cath and Coronary Angiography;  Surgeon: Peter M Martinique, MD;  Location: [place] [facility];  Service: Cardiovascular;  Laterality: N/A;   LEFT INGUINAL HERNIA     MALONEY DILATION  08/03/2012   Procedure: MALONEY DILATION;  Surgeon: Rogene Houston, MD;  [facility];  Service: Endoscopy;  Laterality: N/A;   PACEMAKER IMPLANT N/A 12/30/2019   Procedure: PACEMAKER IMPLANT;  Surgeon: Deboraha Sprang, MD;  Location: [place] [facility];  Service: Cardiovascular;  Laterality: N/A;   PENILE PROSTHESIS IMPLANT     SAVORY DILATION  08/03/2012   Procedure: SAVORY DILATION;  Surgeon: Rogene Houston, MD;  [facility];  Service: Endoscopy;  Laterality: N/A;    Social History   Socioeconomic History   Marital status: Married    Spouse name: Not on file   Number  of children: 7   Years of education: Not on file   Highest education level: Not on file  Occupational History   Occupation: RETIRED    Employer: OTHER    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  Tobacco Use   Smoking status: Former    Packs/day: 2.00    Years: 25.00    Additional pack years: 0.00    Total pack years: 50.00    Types: Cigarettes    Quit date: 08/15/1962    Years since quitting: 60.2   Smokeless tobacco: Never  Vaping Use   Vaping Use: Never used  Substance and Sexual Activity   Alcohol use: No    Alcohol/week: 0.0 standard drinks of alcohol   Drug use: No   Sexual activity: Not on file  Other Topics Concern   Not on file  Social History Narrative   Right handed    Caffeine 1 cup daily   Live in memory care at [place] you right handed or left handed?    Are you currently employed ?no   retired          Scientist, physiological Strain: Not on file  Food Insecurity: No Food Insecurity (06/03/2022)   Hunger Vital Sign    Worried About Running Out of Food in the Last Year: Never true    Ran Out of Food in the Last Year: Never true  Transportation Needs: No Transportation Needs (06/03/2022)   PRAPARE - Hydrologist (Medical): No    Lack of Transportation (Non-Medical): No  Physical Activity: Not on file  Stress: Not on file  Social Connections: Not on file  Intimate Partner Violence: Not At Risk (06/03/2022)   Humiliation, Afraid, Rape, and Kick questionnaire    Fear of Current or Ex-Partner: No    Emotionally Abused: No    Physically Abused: No    Sexually Abused: No    Family History  Problem Relation Age of Onset   Heart attack Father        in his early 71's    Anti-infectives: Anti-infectives (From admission, onward)    None       Current Outpatient Medications  Medication Sig Dispense Refill   acetaminophen (TYLENOL) 650 MG CR tablet Take 650 mg by mouth 2 (two) times daily.     Cholecalciferol (VITAMIN D3) 25 MCG (1000 UT) CAPS Take 1,000 Units by mouth daily.     Cyanocobalamin (B-12) 1000 MCG TABS Take 1 tablet (1,000 mcg total) by mouth daily. 30 tablet 11   diltiazem (CARDIZEM CD) 120 MG 24 hr capsule Take 1 capsule (120 mg total) by mouth daily. 30 capsule 11   divalproex (DEPAKOTE) 125 MG DR tablet Take 1 tablet (125 mg total) by mouth at bedtime. 30 tablet 11   finasteride (PROSCAR) 5 MG tablet Take 5 mg by mouth daily.       hydrOXYzine (ATARAX) 10 MG tablet Take 1 tablet (10 mg total) by mouth in the morning. 30 tablet 0   LORazepam (ATIVAN) 1 MG tablet Take 1 tablet (1 mg total) by mouth every 12 (twelve) hours as needed for anxiety. 10 tablet 0    metoprolol tartrate (LOPRESSOR) 25 MG tablet Take 1 tablet (25 mg total) by mouth 2 (two) times daily. 60 tablet 11   midodrine (PROAMATINE) 10 MG tablet Take 1 tablet (10 mg total) by mouth 3 (three) times daily with meals. 90 tablet 3  nitroGLYCERIN (NITROSTAT) 0.4 MG SL tablet DISSOLVE ONE TABLET UNDER TONGUE EVERY 5 MINUTES UP TO 3 DOSES AS NEEDED FOR CHEST PAIN 25 tablet 3   Nutritional Supplements (ENSURE HIGH PROTEIN) LIQD Take 237 mLs by mouth 3 (three) times daily after meals.     pantoprazole (PROTONIX) 40 MG tablet Take 40 mg by mouth daily.      Propylene Glycol (SYSTANE BALANCE) 0.6 % SOLN Place 1 drop into both eyes in the morning and at bedtime.     No current facility-administered medications for this visit.     Objective: Vital signs in last 24 hours: BP (!) 98/55   Pulse (!) 57   Ht 6' (1.829 m)   Wt 140 lb 3.2 oz (63.6 kg)   BMI 19.01 kg/m   Intake/Output from previous day: No intake/output data recorded. Intake/Output this shift: @IOTHISSHIFT @   Physical Exam  Lab Results:  No results found for this or any previous visit (from the past 24 hour(s)).  BMET No results for input(s): "NA", "K", "CL", "CO2", "GLUCOSE", "BUN", "CREATININE", "CALCIUM" in the last 72 hours. PT/INR No results for input(s): "LABPROT", "INR" in the last 72 hours. ABG No results for input(s): "PHART", "HCO3" in the last 72 hours.  Invalid input(s): "PCO2", "PO2"  Studies/Results: No results found. Hospital records reviewed.   Assessment/Plan: Urinary retention.  We discussed options for management including continued foley drainage with a voiding trial in a month vs placement of a suprapubic tube to relocate the catheter away from the penis with the prosthesis.   At this time, the plan is to continue foley drainage and return for a voiding trial in a month.   No orders of the defined types were placed in this encounter.    No orders of the defined types were placed in this  encounter.    Return in about 4 weeks (around 12/08/2022) for 4 weeks for a voiding trial.    CC: Dr. Ezra Sites     Irine Seal 11/11/2022

## 2022-11-14 NOTE — Progress Notes (Signed)
Remote pacemaker transmission.   

## 2022-11-18 ENCOUNTER — Ambulatory Visit: Payer: Medicare Other | Admitting: Orthopedic Surgery

## 2022-11-18 ENCOUNTER — Other Ambulatory Visit (INDEPENDENT_AMBULATORY_CARE_PROVIDER_SITE_OTHER): Payer: Medicare Other

## 2022-11-18 ENCOUNTER — Encounter: Payer: Self-pay | Admitting: Orthopedic Surgery

## 2022-11-18 DIAGNOSIS — S42202D Unspecified fracture of upper end of left humerus, subsequent encounter for fracture with routine healing: Secondary | ICD-10-CM

## 2022-11-18 NOTE — Progress Notes (Signed)
Return patient Visit  Assessment: Benjamin Mason is a 89 y.o. male with the following: 1. Closed fracture of proximal end of left humerus  Plan: Benjamin Mason has advanced dementia.  He sustained a left proximal humerus fracture approximately almost 3 months ago.  Radiographs are stable.  He tolerates gentle range of motion.  Okay to advance his activities.  He can start weightbearing, and using a walker.  He will follow-up as needed.  Follow-up: Return if symptoms worsen or fail to improve.  Subjective:  Chief Complaint  Patient presents with   Shoulder Pain    Fracture Lt shoulder DOI 08/25/22 Doing ok, moved it wrong when working with him once, and he cried out in pain, but now seems doing really well.       History of Present Illness: Benjamin Mason is a 89 y.o. male who returns for evaluation of left shoulder pain.  He sustained a fall, approximately 3 months ago.  He sustained a left proximal humerus fracture.  He has no pain on range of motion.  He is using his arm on a more regular basis.  They are working on his motion.  He is tolerating more.  Family is asking about returning uses a walker.   Review of Systems: Unable to assess.   Objective: There were no vitals taken for this visit.  Physical Exam:  General: Elderly male., No acute distress., Seated in a wheelchair., and Demented, does not answer questions appropriately.  Gait: Unable to ambulate.  Left shoulder without swelling.  No bruising.  No redness.  Active forward flexion to 100 degrees.  Active abduction to 80 degrees.  Fingers are warm and well-perfused.  IMAGING: I personally ordered and reviewed the following images  X-rays left shoulder were obtained in clinic today.  These are compared to prior x-rays.  There is a proximal humerus fracture, with mild varus alignment.  Interval consolidation.  Glenohumeral joint is reduced.  No evidence of proximal humeral migration.  Impression:  Healed left proximal humerus fracture   New Medications:  No orders of the defined types were placed in this encounter.     Oliver Barre, MD  11/18/2022 11:04 AM

## 2022-11-18 NOTE — Patient Instructions (Signed)
Okay to advance his activities.  Continue to work on range of motion.  Okay to initiate weightbearing activities.  Okay to try using a walker.  Contact the clinic you have any questions or concerns.  Follow-up as needed.

## 2022-12-11 DIAGNOSIS — I10 Essential (primary) hypertension: Secondary | ICD-10-CM | POA: Diagnosis not present

## 2022-12-11 DIAGNOSIS — F01A11 Vascular dementia, mild, with agitation: Secondary | ICD-10-CM | POA: Diagnosis not present

## 2022-12-11 DIAGNOSIS — I4819 Other persistent atrial fibrillation: Secondary | ICD-10-CM | POA: Diagnosis not present

## 2022-12-11 DIAGNOSIS — R1310 Dysphagia, unspecified: Secondary | ICD-10-CM | POA: Diagnosis not present

## 2022-12-13 ENCOUNTER — Telehealth: Payer: Self-pay

## 2022-12-13 NOTE — Telephone Encounter (Signed)
Patient's daughter called advising that due to other health issues patient is not able to make upcoming appointment. She wanted to know if a clinical member could contact her to answer questions she has regarding patients catheter and health.

## 2022-12-14 NOTE — Telephone Encounter (Signed)
Made patient's daughter aware. Gave verbal order to [place] patient's nurse [facility] nursing facility) to ensure patient has a catheter anchor and to increase the fluid in the balloon from 10ml to 30ml which should help keep him from pulling it out. Per Dr. Annabell Howells. Brandy  patient's nurse voiced understanding.

## 2022-12-14 NOTE — Telephone Encounter (Signed)
Return call to patient's daughter Benjamin Mason. Kim voiced that patient is in Hospice care and her dad keeps pulling the catheter out. Benjamin Mason is  made aware to try to use a catheter anchor to see if this will help the patient from pulling the catheter. Benjamin Mason states that they would like to proceed with keeping the catheter and denial subpubic catheter placement. Kim voiced understanding

## 2022-12-15 ENCOUNTER — Ambulatory Visit: Payer: Medicare Other | Admitting: Urology

## 2023-01-11 ENCOUNTER — Ambulatory Visit (INDEPENDENT_AMBULATORY_CARE_PROVIDER_SITE_OTHER): Payer: PRIVATE HEALTH INSURANCE

## 2023-01-11 DIAGNOSIS — I495 Sick sinus syndrome: Secondary | ICD-10-CM | POA: Diagnosis not present

## 2023-01-11 LAB — CUP PACEART REMOTE DEVICE CHECK
Battery Remaining Longevity: 107 mo
Battery Remaining Percentage: 81 %
Battery Voltage: 3.02 V
Brady Statistic RV Percent Paced: 1 %
Date Time Interrogation Session: 20240529020017
Implantable Lead Connection Status: 753985
Implantable Lead Implant Date: 20210517
Implantable Lead Location: 753860
Implantable Pulse Generator Implant Date: 20210517
Lead Channel Impedance Value: 510 Ohm
Lead Channel Pacing Threshold Amplitude: 0.5 V
Lead Channel Pacing Threshold Pulse Width: 0.5 ms
Lead Channel Sensing Intrinsic Amplitude: 3.7 mV
Lead Channel Setting Pacing Amplitude: 2.5 V
Lead Channel Setting Pacing Pulse Width: 0.5 ms
Lead Channel Setting Sensing Sensitivity: 0.7 mV
Pulse Gen Model: 1272
Pulse Gen Serial Number: 3800831

## 2023-01-12 ENCOUNTER — Telehealth: Payer: Self-pay | Admitting: Cardiology

## 2023-01-12 NOTE — Telephone Encounter (Signed)
Pt;s daughter calling to make provider aware that pt is now under Hospice care and will not be needing anymore office visits. Please advise

## 2023-01-12 NOTE — Telephone Encounter (Signed)
   Covering for Dr. Diona Browner - We are sorry to hear that. Thank you for making [place] aware. Please make the [facility] aware in regards to his remote checks.   Thanks,  Ellsworth Lennox, PA-C 01/12/2023, 2:59 PM

## 2023-01-17 ENCOUNTER — Ambulatory Visit: Payer: Medicare Other | Admitting: Cardiology

## 2023-01-25 ENCOUNTER — Ambulatory Visit: Payer: Medicare Other | Admitting: Physician Assistant

## 2023-02-02 NOTE — Progress Notes (Signed)
Remote pacemaker transmission.   

## 2023-02-13 DEATH — deceased

## 2023-04-12 ENCOUNTER — Ambulatory Visit: Payer: Medicare Other

## 2023-07-12 ENCOUNTER — Ambulatory Visit: Payer: Medicare Other

## 2023-10-11 ENCOUNTER — Ambulatory Visit: Payer: Medicare Other

## 2024-01-10 ENCOUNTER — Ambulatory Visit: Payer: Medicare Other

## 2024-04-10 ENCOUNTER — Ambulatory Visit: Payer: Medicare Other
